# Patient Record
Sex: Female | Born: 1935
Health system: Southern US, Community
[De-identification: ages and names within clinical notes are randomized; demographics above are authoritative.]

## PROBLEM LIST (undated history)

## (undated) DIAGNOSIS — I10 Essential (primary) hypertension: Secondary | ICD-10-CM

## (undated) DIAGNOSIS — I509 Heart failure, unspecified: Secondary | ICD-10-CM

## (undated) DIAGNOSIS — E079 Disorder of thyroid, unspecified: Secondary | ICD-10-CM

## (undated) DIAGNOSIS — T7840XA Allergy, unspecified, initial encounter: Secondary | ICD-10-CM

## (undated) DIAGNOSIS — N289 Disorder of kidney and ureter, unspecified: Secondary | ICD-10-CM

## (undated) DIAGNOSIS — E785 Hyperlipidemia, unspecified: Secondary | ICD-10-CM

## (undated) DIAGNOSIS — L570 Actinic keratosis: Secondary | ICD-10-CM

## (undated) DIAGNOSIS — Z8589 Personal history of malignant neoplasm of other organs and systems: Secondary | ICD-10-CM

## (undated) DIAGNOSIS — I4891 Unspecified atrial fibrillation: Secondary | ICD-10-CM

## (undated) HISTORY — DX: Heart failure, unspecified: I50.9

## (undated) HISTORY — PX: CHOLECYSTECTOMY: SHX55

## (undated) HISTORY — PX: HIP SURGERY: SHX245

## (undated) HISTORY — DX: Essential (primary) hypertension: I10

## (undated) HISTORY — DX: Personal history of malignant neoplasm of other organs and systems: Z85.89

## (undated) HISTORY — DX: Hyperlipidemia, unspecified: E78.5

## (undated) HISTORY — DX: Allergy, unspecified, initial encounter: T78.40XA

## (undated) HISTORY — DX: Disorder of thyroid, unspecified: E07.9

---
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
## 2004-09-26 ENCOUNTER — Ambulatory Visit: Payer: Self-pay | Admitting: Internal Medicine

## 2005-04-17 ENCOUNTER — Ambulatory Visit: Payer: Self-pay | Admitting: Cardiology

## 2005-09-27 ENCOUNTER — Ambulatory Visit: Payer: Self-pay | Admitting: Internal Medicine

## 2005-11-06 ENCOUNTER — Ambulatory Visit: Payer: Self-pay | Admitting: Ophthalmology

## 2005-11-12 ENCOUNTER — Ambulatory Visit: Payer: Self-pay | Admitting: Ophthalmology

## 2006-03-25 ENCOUNTER — Ambulatory Visit: Payer: Self-pay | Admitting: Ophthalmology

## 2006-04-01 ENCOUNTER — Ambulatory Visit: Payer: Self-pay | Admitting: Ophthalmology

## 2006-10-01 ENCOUNTER — Ambulatory Visit: Payer: Self-pay | Admitting: Internal Medicine

## 2007-10-02 ENCOUNTER — Ambulatory Visit: Payer: Self-pay | Admitting: Internal Medicine

## 2008-10-04 ENCOUNTER — Ambulatory Visit: Payer: Self-pay | Admitting: Internal Medicine

## 2009-08-19 ENCOUNTER — Ambulatory Visit: Payer: Self-pay

## 2009-10-10 ENCOUNTER — Ambulatory Visit: Payer: Self-pay | Admitting: Internal Medicine

## 2010-10-12 ENCOUNTER — Ambulatory Visit: Payer: Self-pay | Admitting: Internal Medicine

## 2011-09-13 ENCOUNTER — Ambulatory Visit: Payer: Self-pay | Admitting: Unknown Physician Specialty

## 2011-09-13 LAB — CREATININE, SERUM
Creatinine: 1.13 mg/dL (ref 0.60–1.30)
EGFR (African American): 54 — ABNORMAL LOW

## 2011-10-15 ENCOUNTER — Ambulatory Visit: Payer: Self-pay | Admitting: Internal Medicine

## 2012-03-27 ENCOUNTER — Ambulatory Visit: Payer: Self-pay | Admitting: Internal Medicine

## 2012-10-15 ENCOUNTER — Ambulatory Visit: Payer: Self-pay | Admitting: Internal Medicine

## 2013-05-05 DIAGNOSIS — I4891 Unspecified atrial fibrillation: Secondary | ICD-10-CM | POA: Insufficient documentation

## 2013-08-26 DIAGNOSIS — Z9889 Other specified postprocedural states: Secondary | ICD-10-CM | POA: Insufficient documentation

## 2013-08-26 DIAGNOSIS — I493 Ventricular premature depolarization: Secondary | ICD-10-CM | POA: Insufficient documentation

## 2013-08-26 DIAGNOSIS — I071 Rheumatic tricuspid insufficiency: Secondary | ICD-10-CM | POA: Insufficient documentation

## 2013-08-26 DIAGNOSIS — I351 Nonrheumatic aortic (valve) insufficiency: Secondary | ICD-10-CM | POA: Insufficient documentation

## 2013-08-26 DIAGNOSIS — I340 Nonrheumatic mitral (valve) insufficiency: Secondary | ICD-10-CM | POA: Insufficient documentation

## 2013-10-20 DIAGNOSIS — N183 Chronic kidney disease, stage 3 unspecified: Secondary | ICD-10-CM | POA: Insufficient documentation

## 2013-10-26 ENCOUNTER — Ambulatory Visit: Payer: Self-pay | Admitting: Internal Medicine

## 2014-01-20 DIAGNOSIS — E034 Atrophy of thyroid (acquired): Secondary | ICD-10-CM | POA: Insufficient documentation

## 2014-04-26 DIAGNOSIS — D519 Vitamin B12 deficiency anemia, unspecified: Secondary | ICD-10-CM | POA: Insufficient documentation

## 2014-08-25 ENCOUNTER — Emergency Department: Payer: PPO

## 2014-08-25 ENCOUNTER — Encounter: Payer: Self-pay | Admitting: Emergency Medicine

## 2014-08-25 ENCOUNTER — Emergency Department
Admission: EM | Admit: 2014-08-25 | Discharge: 2014-08-25 | Disposition: A | Discharge status: Home / Self Care | Payer: PPO | Attending: Emergency Medicine | Admitting: Emergency Medicine

## 2014-08-25 ENCOUNTER — Other Ambulatory Visit: Payer: Self-pay

## 2014-08-25 DIAGNOSIS — S60221A Contusion of right hand, initial encounter: Secondary | ICD-10-CM | POA: Diagnosis not present

## 2014-08-25 DIAGNOSIS — Y9389 Activity, other specified: Secondary | ICD-10-CM | POA: Diagnosis not present

## 2014-08-25 DIAGNOSIS — Z880 Allergy status to penicillin: Secondary | ICD-10-CM | POA: Insufficient documentation

## 2014-08-25 DIAGNOSIS — I482 Chronic atrial fibrillation: Secondary | ICD-10-CM | POA: Diagnosis not present

## 2014-08-25 DIAGNOSIS — S20219A Contusion of unspecified front wall of thorax, initial encounter: Secondary | ICD-10-CM

## 2014-08-25 DIAGNOSIS — Y92410 Unspecified street and highway as the place of occurrence of the external cause: Secondary | ICD-10-CM | POA: Insufficient documentation

## 2014-08-25 DIAGNOSIS — S299XXA Unspecified injury of thorax, initial encounter: Secondary | ICD-10-CM | POA: Diagnosis present

## 2014-08-25 DIAGNOSIS — Y998 Other external cause status: Secondary | ICD-10-CM | POA: Insufficient documentation

## 2014-08-25 HISTORY — DX: Unspecified atrial fibrillation: I48.91

## 2014-08-25 LAB — CBC WITH DIFFERENTIAL/PLATELET
Basophils Absolute: 0 10*3/uL (ref 0–0.1)
Basophils Relative: 0 %
EOS ABS: 0 10*3/uL (ref 0–0.7)
Eosinophils Relative: 0 %
HCT: 35.9 % (ref 35.0–47.0)
Hemoglobin: 11.8 g/dL — ABNORMAL LOW (ref 12.0–16.0)
Lymphocytes Relative: 9 %
Lymphs Abs: 1.1 10*3/uL (ref 1.0–3.6)
MCH: 30.3 pg (ref 26.0–34.0)
MCHC: 32.9 g/dL (ref 32.0–36.0)
MCV: 92.1 fL (ref 80.0–100.0)
MONOS PCT: 8 %
Monocytes Absolute: 1 10*3/uL — ABNORMAL HIGH (ref 0.2–0.9)
NEUTROS PCT: 83 %
Neutro Abs: 9.9 10*3/uL — ABNORMAL HIGH (ref 1.4–6.5)
Platelets: 332 10*3/uL (ref 150–440)
RBC: 3.9 MIL/uL (ref 3.80–5.20)
RDW: 14 % (ref 11.5–14.5)
WBC: 12 10*3/uL — ABNORMAL HIGH (ref 3.6–11.0)

## 2014-08-25 LAB — BASIC METABOLIC PANEL
ANION GAP: 13 (ref 5–15)
BUN: 41 mg/dL — ABNORMAL HIGH (ref 6–20)
CALCIUM: 9.1 mg/dL (ref 8.9–10.3)
CO2: 24 mmol/L (ref 22–32)
Chloride: 95 mmol/L — ABNORMAL LOW (ref 101–111)
Creatinine, Ser: 1.5 mg/dL — ABNORMAL HIGH (ref 0.44–1.00)
GFR, EST AFRICAN AMERICAN: 36 mL/min — AB (ref >60)
GFR, EST NON AFRICAN AMERICAN: 31 mL/min — AB (ref >60)
Glucose, Bld: 113 mg/dL — ABNORMAL HIGH (ref 65–99)
POTASSIUM: 3.5 mmol/L (ref 3.5–5.1)
Sodium: 132 mmol/L — ABNORMAL LOW (ref 135–145)

## 2014-08-25 LAB — PROTIME-INR
INR: 1.17
PROTHROMBIN TIME: 15.1 s — AB (ref 11.4–15.0)

## 2014-08-25 LAB — TROPONIN I: Troponin I: 0.03 ng/mL (ref <0.031)
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
## 2014-08-25 NOTE — ED Notes (Signed)
Dr Reita Cliche at bedside, explaining lab results to pt and pt's husband.

## 2015-01-24 DIAGNOSIS — E034 Atrophy of thyroid (acquired): Secondary | ICD-10-CM | POA: Diagnosis not present

## 2015-01-24 DIAGNOSIS — E538 Deficiency of other specified B group vitamins: Secondary | ICD-10-CM | POA: Diagnosis not present

## 2015-01-31 ENCOUNTER — Other Ambulatory Visit: Payer: Self-pay | Admitting: Internal Medicine

## 2015-01-31 DIAGNOSIS — D519 Vitamin B12 deficiency anemia, unspecified: Secondary | ICD-10-CM | POA: Diagnosis not present

## 2015-01-31 DIAGNOSIS — Z1231 Encounter for screening mammogram for malignant neoplasm of breast: Secondary | ICD-10-CM

## 2015-01-31 DIAGNOSIS — E034 Atrophy of thyroid (acquired): Secondary | ICD-10-CM | POA: Diagnosis not present

## 2015-01-31 DIAGNOSIS — N183 Chronic kidney disease, stage 3 (moderate): Secondary | ICD-10-CM | POA: Diagnosis not present

## 2015-01-31 DIAGNOSIS — M810 Age-related osteoporosis without current pathological fracture: Secondary | ICD-10-CM | POA: Diagnosis not present

## 2015-01-31 DIAGNOSIS — I25110 Atherosclerotic heart disease of native coronary artery with unstable angina pectoris: Secondary | ICD-10-CM | POA: Diagnosis not present

## 2015-01-31 DIAGNOSIS — I482 Chronic atrial fibrillation: Secondary | ICD-10-CM | POA: Diagnosis not present

## 2015-01-31 DIAGNOSIS — E7800 Pure hypercholesterolemia, unspecified: Secondary | ICD-10-CM | POA: Diagnosis not present

## 2015-01-31 DIAGNOSIS — Z0001 Encounter for general adult medical examination with abnormal findings: Secondary | ICD-10-CM | POA: Diagnosis not present

## 2015-02-02 DIAGNOSIS — I482 Chronic atrial fibrillation: Secondary | ICD-10-CM | POA: Diagnosis not present

## 2015-02-07 DIAGNOSIS — M810 Age-related osteoporosis without current pathological fracture: Secondary | ICD-10-CM | POA: Diagnosis not present

## 2015-02-07 DIAGNOSIS — D519 Vitamin B12 deficiency anemia, unspecified: Secondary | ICD-10-CM | POA: Diagnosis not present

## 2015-02-09 ENCOUNTER — Other Ambulatory Visit: Payer: Self-pay | Admitting: Internal Medicine

## 2015-02-09 ENCOUNTER — Ambulatory Visit
Admission: RE | Admit: 2015-02-09 | Discharge: 2015-02-09 | Disposition: A | Discharge status: Home / Self Care | Payer: PPO | Source: Ambulatory Visit | Attending: Internal Medicine | Admitting: Internal Medicine

## 2015-02-09 DIAGNOSIS — Z1231 Encounter for screening mammogram for malignant neoplasm of breast: Secondary | ICD-10-CM

## 2015-02-14 DIAGNOSIS — Z9889 Other specified postprocedural states: Secondary | ICD-10-CM | POA: Diagnosis not present

## 2015-02-14 DIAGNOSIS — I341 Nonrheumatic mitral (valve) prolapse: Secondary | ICD-10-CM | POA: Diagnosis not present

## 2015-02-14 DIAGNOSIS — E7800 Pure hypercholesterolemia, unspecified: Secondary | ICD-10-CM | POA: Diagnosis not present

## 2015-02-14 DIAGNOSIS — I493 Ventricular premature depolarization: Secondary | ICD-10-CM | POA: Diagnosis not present

## 2015-02-14 DIAGNOSIS — N183 Chronic kidney disease, stage 3 (moderate): Secondary | ICD-10-CM | POA: Diagnosis not present

## 2015-02-14 DIAGNOSIS — I2510 Atherosclerotic heart disease of native coronary artery without angina pectoris: Secondary | ICD-10-CM | POA: Diagnosis not present

## 2015-02-14 DIAGNOSIS — I071 Rheumatic tricuspid insufficiency: Secondary | ICD-10-CM | POA: Diagnosis not present

## 2015-02-14 DIAGNOSIS — I340 Nonrheumatic mitral (valve) insufficiency: Secondary | ICD-10-CM | POA: Diagnosis not present

## 2015-02-14 DIAGNOSIS — I129 Hypertensive chronic kidney disease with stage 1 through stage 4 chronic kidney disease, or unspecified chronic kidney disease: Secondary | ICD-10-CM | POA: Diagnosis not present

## 2015-02-14 DIAGNOSIS — N2581 Secondary hyperparathyroidism of renal origin: Secondary | ICD-10-CM | POA: Diagnosis not present

## 2015-02-14 DIAGNOSIS — R0602 Shortness of breath: Secondary | ICD-10-CM | POA: Diagnosis not present

## 2015-02-14 DIAGNOSIS — R0681 Apnea, not elsewhere classified: Secondary | ICD-10-CM | POA: Insufficient documentation

## 2015-02-14 DIAGNOSIS — R609 Edema, unspecified: Secondary | ICD-10-CM | POA: Diagnosis not present

## 2015-02-14 DIAGNOSIS — I351 Nonrheumatic aortic (valve) insufficiency: Secondary | ICD-10-CM | POA: Diagnosis not present

## 2015-02-16 DIAGNOSIS — Z961 Presence of intraocular lens: Secondary | ICD-10-CM | POA: Diagnosis not present

## 2015-02-18 DIAGNOSIS — M461 Sacroiliitis, not elsewhere classified: Secondary | ICD-10-CM | POA: Diagnosis not present

## 2015-02-23 DIAGNOSIS — R0602 Shortness of breath: Secondary | ICD-10-CM | POA: Diagnosis not present

## 2015-02-23 DIAGNOSIS — I2510 Atherosclerotic heart disease of native coronary artery without angina pectoris: Secondary | ICD-10-CM | POA: Diagnosis not present

## 2015-03-02 DIAGNOSIS — I341 Nonrheumatic mitral (valve) prolapse: Secondary | ICD-10-CM | POA: Diagnosis not present

## 2015-03-02 DIAGNOSIS — I493 Ventricular premature depolarization: Secondary | ICD-10-CM | POA: Diagnosis not present

## 2015-03-02 DIAGNOSIS — I340 Nonrheumatic mitral (valve) insufficiency: Secondary | ICD-10-CM | POA: Diagnosis not present

## 2015-03-02 DIAGNOSIS — E7800 Pure hypercholesterolemia, unspecified: Secondary | ICD-10-CM | POA: Diagnosis not present

## 2015-03-02 DIAGNOSIS — I2510 Atherosclerotic heart disease of native coronary artery without angina pectoris: Secondary | ICD-10-CM | POA: Diagnosis not present

## 2015-03-02 DIAGNOSIS — I482 Chronic atrial fibrillation: Secondary | ICD-10-CM | POA: Diagnosis not present

## 2015-03-02 DIAGNOSIS — Z9889 Other specified postprocedural states: Secondary | ICD-10-CM | POA: Diagnosis not present

## 2015-03-02 DIAGNOSIS — I480 Paroxysmal atrial fibrillation: Secondary | ICD-10-CM | POA: Diagnosis not present

## 2015-03-02 DIAGNOSIS — R0602 Shortness of breath: Secondary | ICD-10-CM | POA: Diagnosis not present

## 2015-03-10 DIAGNOSIS — D519 Vitamin B12 deficiency anemia, unspecified: Secondary | ICD-10-CM | POA: Diagnosis not present

## 2015-03-18 DIAGNOSIS — M5134 Other intervertebral disc degeneration, thoracic region: Secondary | ICD-10-CM | POA: Diagnosis not present

## 2015-03-18 DIAGNOSIS — S22080D Wedge compression fracture of T11-T12 vertebra, subsequent encounter for fracture with routine healing: Secondary | ICD-10-CM | POA: Diagnosis not present

## 2015-03-18 DIAGNOSIS — M546 Pain in thoracic spine: Secondary | ICD-10-CM | POA: Diagnosis not present

## 2015-03-30 DIAGNOSIS — I482 Chronic atrial fibrillation: Secondary | ICD-10-CM | POA: Diagnosis not present

## 2015-04-04 DIAGNOSIS — Z85828 Personal history of other malignant neoplasm of skin: Secondary | ICD-10-CM | POA: Diagnosis not present

## 2015-04-04 DIAGNOSIS — I8393 Asymptomatic varicose veins of bilateral lower extremities: Secondary | ICD-10-CM | POA: Diagnosis not present

## 2015-04-04 DIAGNOSIS — L578 Other skin changes due to chronic exposure to nonionizing radiation: Secondary | ICD-10-CM | POA: Diagnosis not present

## 2015-04-04 DIAGNOSIS — L570 Actinic keratosis: Secondary | ICD-10-CM | POA: Diagnosis not present

## 2015-04-04 DIAGNOSIS — I781 Nevus, non-neoplastic: Secondary | ICD-10-CM | POA: Diagnosis not present

## 2015-04-04 DIAGNOSIS — D485 Neoplasm of uncertain behavior of skin: Secondary | ICD-10-CM | POA: Diagnosis not present

## 2015-04-04 DIAGNOSIS — D692 Other nonthrombocytopenic purpura: Secondary | ICD-10-CM | POA: Diagnosis not present

## 2015-04-04 DIAGNOSIS — L820 Inflamed seborrheic keratosis: Secondary | ICD-10-CM | POA: Diagnosis not present

## 2015-04-04 DIAGNOSIS — Z1283 Encounter for screening for malignant neoplasm of skin: Secondary | ICD-10-CM | POA: Diagnosis not present

## 2015-04-04 DIAGNOSIS — L821 Other seborrheic keratosis: Secondary | ICD-10-CM | POA: Diagnosis not present

## 2015-04-12 DIAGNOSIS — D519 Vitamin B12 deficiency anemia, unspecified: Secondary | ICD-10-CM | POA: Diagnosis not present

## 2015-04-27 DIAGNOSIS — I482 Chronic atrial fibrillation: Secondary | ICD-10-CM | POA: Diagnosis not present

## 2015-05-16 DIAGNOSIS — D519 Vitamin B12 deficiency anemia, unspecified: Secondary | ICD-10-CM | POA: Diagnosis not present

## 2015-05-25 DIAGNOSIS — I482 Chronic atrial fibrillation: Secondary | ICD-10-CM | POA: Diagnosis not present

## 2015-06-16 DIAGNOSIS — D519 Vitamin B12 deficiency anemia, unspecified: Secondary | ICD-10-CM | POA: Diagnosis not present

## 2015-06-27 DIAGNOSIS — I341 Nonrheumatic mitral (valve) prolapse: Secondary | ICD-10-CM | POA: Diagnosis not present

## 2015-06-27 DIAGNOSIS — R0602 Shortness of breath: Secondary | ICD-10-CM | POA: Diagnosis not present

## 2015-06-27 DIAGNOSIS — I2510 Atherosclerotic heart disease of native coronary artery without angina pectoris: Secondary | ICD-10-CM | POA: Diagnosis not present

## 2015-06-27 DIAGNOSIS — E7800 Pure hypercholesterolemia, unspecified: Secondary | ICD-10-CM | POA: Diagnosis not present

## 2015-06-27 DIAGNOSIS — I351 Nonrheumatic aortic (valve) insufficiency: Secondary | ICD-10-CM | POA: Diagnosis not present

## 2015-06-27 DIAGNOSIS — I340 Nonrheumatic mitral (valve) insufficiency: Secondary | ICD-10-CM | POA: Diagnosis not present

## 2015-06-27 DIAGNOSIS — I493 Ventricular premature depolarization: Secondary | ICD-10-CM | POA: Diagnosis not present

## 2015-06-27 DIAGNOSIS — I071 Rheumatic tricuspid insufficiency: Secondary | ICD-10-CM | POA: Diagnosis not present

## 2015-06-27 DIAGNOSIS — Z9889 Other specified postprocedural states: Secondary | ICD-10-CM | POA: Diagnosis not present

## 2015-06-27 DIAGNOSIS — I480 Paroxysmal atrial fibrillation: Secondary | ICD-10-CM | POA: Diagnosis not present

## 2015-07-18 DIAGNOSIS — D519 Vitamin B12 deficiency anemia, unspecified: Secondary | ICD-10-CM | POA: Diagnosis not present

## 2015-07-25 DIAGNOSIS — E034 Atrophy of thyroid (acquired): Secondary | ICD-10-CM | POA: Diagnosis not present

## 2015-07-25 DIAGNOSIS — I480 Paroxysmal atrial fibrillation: Secondary | ICD-10-CM | POA: Diagnosis not present

## 2015-07-28 ENCOUNTER — Ambulatory Visit
Admission: RE | Admit: 2015-07-28 | Discharge: 2015-07-28 | Disposition: A | Discharge status: Home / Self Care | Payer: PPO | Source: Ambulatory Visit | Attending: Pain Medicine | Admitting: Pain Medicine

## 2015-07-28 ENCOUNTER — Ambulatory Visit (HOSPITAL_BASED_OUTPATIENT_CLINIC_OR_DEPARTMENT_OTHER): Payer: PPO | Admitting: Pain Medicine

## 2015-07-28 ENCOUNTER — Encounter: Payer: Self-pay | Admitting: Pain Medicine

## 2015-07-28 VITALS — BP 136/60 | HR 72 | Temp 97.6°F | Resp 16 | Ht 60.0 in | Wt 157.5 lb

## 2015-07-28 DIAGNOSIS — I700 Atherosclerosis of aorta: Secondary | ICD-10-CM | POA: Diagnosis not present

## 2015-07-28 DIAGNOSIS — G8929 Other chronic pain: Secondary | ICD-10-CM | POA: Insufficient documentation

## 2015-07-28 DIAGNOSIS — S32010S Wedge compression fracture of first lumbar vertebra, sequela: Secondary | ICD-10-CM | POA: Diagnosis not present

## 2015-07-28 DIAGNOSIS — M533 Sacrococcygeal disorders, not elsewhere classified: Secondary | ICD-10-CM | POA: Insufficient documentation

## 2015-07-28 DIAGNOSIS — K449 Diaphragmatic hernia without obstruction or gangrene: Secondary | ICD-10-CM | POA: Insufficient documentation

## 2015-07-28 DIAGNOSIS — I341 Nonrheumatic mitral (valve) prolapse: Secondary | ICD-10-CM | POA: Insufficient documentation

## 2015-07-28 DIAGNOSIS — S32010A Wedge compression fracture of first lumbar vertebra, initial encounter for closed fracture: Secondary | ICD-10-CM | POA: Insufficient documentation

## 2015-07-28 DIAGNOSIS — M545 Low back pain, unspecified: Secondary | ICD-10-CM | POA: Insufficient documentation

## 2015-07-28 DIAGNOSIS — E785 Hyperlipidemia, unspecified: Secondary | ICD-10-CM | POA: Insufficient documentation

## 2015-07-28 DIAGNOSIS — I739 Peripheral vascular disease, unspecified: Secondary | ICD-10-CM | POA: Diagnosis not present

## 2015-07-28 DIAGNOSIS — M4854XS Collapsed vertebra, not elsewhere classified, thoracic region, sequela of fracture: Secondary | ICD-10-CM

## 2015-07-28 DIAGNOSIS — X58XXXA Exposure to other specified factors, initial encounter: Secondary | ICD-10-CM | POA: Insufficient documentation

## 2015-07-28 DIAGNOSIS — M5136 Other intervertebral disc degeneration, lumbar region: Secondary | ICD-10-CM | POA: Insufficient documentation

## 2015-07-28 DIAGNOSIS — M4856XA Collapsed vertebra, not elsewhere classified, lumbar region, initial encounter for fracture: Secondary | ICD-10-CM | POA: Diagnosis not present

## 2015-07-28 DIAGNOSIS — S22080S Wedge compression fracture of T11-T12 vertebra, sequela: Secondary | ICD-10-CM

## 2015-07-28 DIAGNOSIS — M47816 Spondylosis without myelopathy or radiculopathy, lumbar region: Secondary | ICD-10-CM | POA: Insufficient documentation

## 2015-07-28 DIAGNOSIS — I2510 Atherosclerotic heart disease of native coronary artery without angina pectoris: Secondary | ICD-10-CM | POA: Insufficient documentation

## 2015-07-28 DIAGNOSIS — S22080A Wedge compression fracture of T11-T12 vertebra, initial encounter for closed fracture: Secondary | ICD-10-CM | POA: Insufficient documentation

## 2015-07-28 DIAGNOSIS — M810 Age-related osteoporosis without current pathological fracture: Secondary | ICD-10-CM | POA: Insufficient documentation

## 2015-07-28 NOTE — Progress Notes (Signed)
Safety precautions to be maintained throughout the outpatient stay will include: orient to surroundings, keep bed in low position, maintain call bell within reach at all times, provide assistance with transfer out of bed and ambulation.  New Pt today

## 2015-07-28 NOTE — Progress Notes (Signed)
Patient's Name: Elizabeth Fields  Patient type: New patient  MRN: [id number]  Service setting: Ambulatory outpatient  DOB: May 02, 1931  Location: [facility]  DOS: 07/28/2015  Primary Care Physician: No primary care provider on file.  Note by: Philipp Callegari A. Dossie Arbour, M.D, DABA, DABAPM, DABPM, DABIPP, FIPP  Referring Physician: No ref. provider found  Specialty: Board-Certified Interventional Pain Management     Primary Reason(s) for Visit: Initial Patient Evaluation CC: Back Pain   HPI  Elizabeth Fields is a 82 y.o. year old, female patient, who comes today for an initial evaluation. She has Chronic sacroiliac joint pain (Left); Vitamin B12 deficiency anemia; AI (aortic incompetence); Atrial fibrillation ([place]); Arteriosclerosis of coronary artery; Chronic kidney disease (CKD), stage III (moderate); Beat, premature ventricular; History of cardiac catheterization; Bergmann's syndrome; HLD (hyperlipidemia); Acquired atrophy of thyroid; MI (mitral incompetence); Billowing mitral valve; Osteoporosis, post-menopausal; Breathlessness on exertion; TI (tricuspid incompetence); Lumbar facet syndrome (Bilateral); Chronic low back pain (Bilateral); Compression fracture of L1 lumbar vertebra (HCC); and Compression fracture of T12 vertebra (HCC) on her problem list.. Her primarily concern today is the Back Pain   Pain Assessment: Self-Reported Pain Score: 2              Reported level is compatible with observation       Pain Type: Chronic pain Pain Location: Back Pain Orientation: Mid, Lower Pain Descriptors / Indicators: Aching, Constant, Radiating (changes with activity) Pain Frequency: Constant  Onset and Duration: Gradual, Date of onset: About 1 year ago and Present longer than 3 months Cause of pain: Unknown Severity: No change since onset, NAS-11 at its worse: 8/10, NAS-11 at its best: 1/10, NAS-11 now: 5/10 and NAS-11 on the average: 4/10 Timing: During activity or exercise and After  activity or exercise Aggravating Factors: Climbing, Lifiting, Motion, Walking, Walking uphill and Working Alleviating Factors: Lying down Associated Problems: Night-time cramps and Fatigue Quality of Pain: Aching, Pressure-like, Sharp and Toothache-like Previous Examinations or Tests: X-rays and Orthoperdic evaluation Previous Treatments: Trigger point injections  The patient comes into the clinics today for the first time for a chronic pain management evaluation. The pain is described to be in the lower part of the back going across from one side to the other. The patient denies any significant referral of the pain to anywhere else. She also indicates that the pain is worse when she straightens up or when she stands for a prolonged period time, suggesting spinal stenosis. Since the patient is in her 80s, this is a likely scenario. In addition, the patient has a prior history of a T12 and an L1 compression fracture. Both of them seem to be old but the patient could've had some retropulsion into the canal and therefore we will be ordering a CT of that upper lumbar spine.   Historic Controlled Substance Pharmacotherapy Review  Currently Prescribed Analgesic: None MME/day: 0 mg/day Pharmacodynamics: N/A Historical Background Evaluation: N/A Risk Assessment: Opioid Risk Tool (ORT) Score: Total Score: 0 Low Risk for SUD (Score <3) Depression Scale Score: PHQ-2: PHQ-2 Total Score: 0 No depression (0) PHQ-9: PHQ-9 Total Score: 0 No depression (0-4)  Meds  The patient has a current medication list which includes the following prescription(s): aspirin, doxepin, esomeprazole, furosemide, levothyroxine, magnesium oxide, metoprolol succinate, simvastatin, spironolactone, and warfarin.  Imaging Review  Shoulder Imaging: Shoulder-R MR wo contrast:  Results for orders placed in visit on 08/19/09  MR Shoulder Right Wo Contrast   Narrative * PRIOR REPORT IMPORTED FROM AN EXTERNAL  SYSTEM *   PRIOR  REPORT IMPORTED FROM THE [facility] WORKFLOW SYSTEM   REASON FOR EXAM:    R SHOULDER PAIN  COMMENTS:   PROCEDURE:     MR  - MR SHOULDER RT  WO CONTRAST  - Aug 19 2009 12:20PM   RESULT:   TECHNIQUE: Multiplanar and multisequence imaging of the right shoulder was  obtained without the administration of gadolinium.   Evaluation of the osseous structures demonstrates mild subchondral cyst  formation along the humeral head. Degenerative changes are appreciated  involving the acromioclavicular joint. There is mild joint space  irregularity as well as mild osteophytosis. Evaluation of the  supraspinatus  tendon demonstrates an area of fluid signal that appears to rest  superficial  to the tendon. There is a mild amount of fluid signal along the  undersurface  of the tendon. The architecture of the tendon appears be intact. There is  no  evidence of muscle or tendon retraction. Fluid signal also projects within  the subacromial/subdeltoid bursal region.   Evaluation of marrow signal demonstrates a wavy area of signal void along  the inferior aspect of the glenoid. There is mild edema appreciated on the  proton-density imaging associated with this area as well as on the T2 fat  sat imaging. This may represent a Mach line though a nondisplaced fracture  cannot be excluded. The remaining osseous signal appears to be intact.   IMPRESSION:   1. Findings which likely represents bursitis involving the  subacromial/subdeltoid bursa. There does not appear to be evidence of  rotator cuff injury.  2. Findings which may represent an incomplete fracture involving the  inferior aspect of the glenoid versus a mach line.  3. Mild to moderate degenerative changes.   Thank you for the opportunity to contribute to the care of your patient.       Note: Imaging results reviewed.  ROS  Cardiovascular History: Heart trouble, Abnormal heart rhythm, Daily Aspirin intake, Heart valve problems, Heart  catheterization and Blood thinners:  Anticoagulant Pulmonary or Respiratory History: Negative for bronchial asthma, emphysema, chronic smoking, chronic bronchitis, sarcoidosis, tuberculosis or sleep apena Neurological History: Negative for epilepsy, stroke, urinary or fecal inontinence, spina bifida or tethered cord syndrome Review of Past Neurological Studies:  Results for orders placed or performed in visit on 09/13/11  MR Brain W Wo Contrast   Narrative   * PRIOR REPORT IMPORTED FROM AN EXTERNAL SYSTEM *   PRIOR REPORT IMPORTED FROM THE [facility] WORKFLOW SYSTEM   REASON FOR EXAM:    sudden rt hearing loss  COMMENTS:   PROCEDURE:     MR  - MR BRAIN W/WO INC IAC W/WO ROUTI  - Sep 13 2011  11:18AM   RESULT:     Technique: Multiplanar, multisequence MRI of the internal  auditory canals was obtained pre and post administration of 10 ml of  Magnevist IV contrast.   Comparison: No comparison .   Findings:   The cranial nerve VII and cranial nerve VIII bundles are normal in signal  without evidence of mass lesion or signal abnormality. There is no  abnormal  enhancement. There is no masses or signal abnormality in the  cerebellopontine angles.   There is no diffuse or focal parenchymal abnormality. There is no  diffusion  abnormality.  The ventricles are normal in size, shape, and position.  There  are no abnormal extra-axial fluid collections. There is generalized  cerebral  atrophy.   The included paranasal sinuses, mastoid  air cells and orbits are  unremarkable. The skull base and calvarium demonstrate normal signal. The  major intracranial flow voids, including dural venous sinuses appear  patent.   IMPRESSION:   Normal MRI of the internal auditory canals.   Dictation Site: 1       Psychological-Psychiatric History: Negative for anxiety, depression, schizophrenia, bipolar disorders or suicidal ideations or attempts Gastrointestinal History: Hiatal  hernia Genitourinary History: Renal failure Hematological History: Brusing easily and Positive for taking the following blood thinner: Coumadin Endocrine History: Hperthyroidism Rheumatologic History: Negative for lupus, osteoarthritis, rheumatoid arthritis, myositis, polymyositis or fibromyagia Musculoskeletal History: Negative for myasthenia gravis, muscular dystrophy, multiple sclerosis or malignant hyperthermia Work History: Retired  Allergies  Ms. Edmon is allergic to fosamax; penicillins; and tramadol.  Laboratory Chemistry  Inflammation Markers No results found for: ESRSEDRATE, CRP  Renal Function Lab Results  Component Value Date   BUN 41* 08/25/2014   CREATININE 1.50* 08/25/2014   GFRAA 36* 08/25/2014   GFRNONAA 31* 08/25/2014    Hepatic Function No results found for: AST, ALT, ALBUMIN  Electrolytes Lab Results  Component Value Date   NA 132* 08/25/2014   K 3.5 08/25/2014   CL 95* 08/25/2014   CALCIUM 9.1 08/25/2014    Pain Modulating Vitamins No results found for: Marveen Reeks, [id number], [id number], 25OHVITD1, 25OHVITD2, 25OHVITD3, VITAMINB12  Coagulation Parameters Lab Results  Component Value Date   INR 1.17 08/25/2014   LABPROT 15.1* 08/25/2014   PLT 332 08/25/2014    Cardiovascular Lab Results  Component Value Date   HGB 11.8* 08/25/2014   HCT 35.9 08/25/2014    Note: Lab results reviewed.  [place]  Medical:  Ms. Vidrio  has a past medical history of Atrial fibrillation ([place]); Allergy; Thyroid disease; Hyperlipidemia; Hypertension; and CHF (congestive heart failure) ([place]). Family: family history includes Breast cancer (age of onset: 39) in her mother; Heart disease in her father. Surgical:  has past surgical history that includes Cholecystectomy and Hip surgery. Tobacco:  reports that she has never smoked. She does not have any smokeless tobacco history on file. Alcohol:  reports that she does not drink alcohol. Drug:  reports that she does  not use illicit drugs. Active Ambulatory Problems    Diagnosis Date Noted  . Chronic sacroiliac joint pain (Left) 07/28/2015  . Vitamin B12 deficiency anemia 04/26/2014  . AI (aortic incompetence) 08/26/2013  . Atrial fibrillation ([place]) 05/05/2013  . Arteriosclerosis of coronary artery 07/28/2015  . Chronic kidney disease (CKD), stage III (moderate) 10/20/2013  . Beat, premature ventricular 08/26/2013  . History of cardiac catheterization 08/26/2013  . Bergmann's syndrome 07/28/2015  . HLD (hyperlipidemia) 07/28/2015  . Acquired atrophy of thyroid 01/20/2014  . MI (mitral incompetence) 08/26/2013  . Billowing mitral valve 07/28/2015  . Osteoporosis, post-menopausal 07/28/2015  . Breathlessness on exertion 02/14/2015  . TI (tricuspid incompetence) 08/26/2013  . Lumbar facet syndrome (Bilateral) 07/28/2015  . Chronic low back pain (Bilateral) 07/28/2015  . Compression fracture of L1 lumbar vertebra ([place]) 07/28/2015  . Compression fracture of T12 vertebra ([place]) 07/28/2015   Resolved Ambulatory Problems    Diagnosis Date Noted  . No Resolved Ambulatory Problems   Past Medical History  Diagnosis Date  . Allergy   . Thyroid disease   . Hyperlipidemia   . Hypertension   . CHF (congestive heart failure) (HCC)     Constitutional Exam  Vitals: Blood pressure 136/60, pulse 72, temperature 97.6 F (36.4 C), temperature source Oral, resp. rate 16, height 5' (1.524 m), weight  157 lb 8 oz (71.442 kg), SpO2 99 %. General appearance: Well nourished, well developed, and well hydrated. In no acute distress Calculated BMI/Body habitus: Body mass index is 30.76 kg/(m^2). (30-34.9 kg/m2) Obese (Class I) - 68% higher incidence of chronic pain Psych/Mental status: Alert and oriented x 3 (person, place, & time) Eyes: PERLA Respiratory: No evidence of acute respiratory distress  Cervical Spine Exam  Inspection: No masses, redness, or swelling Alignment: Symmetrical ROM: Functional: Adequate  ROM Active: Unrestricted ROM Stability: No instability detected Muscle strength & Tone: Functionally intact Sensory: Unimpaired Palpation: No complaints of tenderness  Upper Extremity (UE) Exam    Side: Right upper extremity  Side: Left upper extremity  Inspection: No masses, redness, swelling, or asymmetry  Inspection: No masses, redness, swelling, or asymmetry  ROM:  ROM:  Functional: Adequate ROM        Functional: Adequate ROM        Active: Unrestricted ROM  Active: Unrestricted ROM  Muscle strength & Tone: Functionally intact  Muscle strength & Tone: Functionally intact  Sensory: Unimpaired  Sensory: Unimpaired  Palpation: No complaints of tenderness  Palpation: No complaints of tenderness   Thoracic Spine Exam  Inspection: No masses, redness, or swelling Alignment: Mildly pronounced thoracic kyphosis. ROM: Functional: Adequate ROM Active: Unrestricted ROM Stability: No instability detected Sensory: Unimpaired Muscle strength & Tone: Functionally intact Palpation: No complaints of tenderness  Lumbar Spine Exam  Inspection: No masses, redness, or swelling Alignment: Symmetrical ROM: Functional: Limited ROM Active: Limited ROM Stability: No instability detected Muscle strength & Tone: Increased muscle tone and tension Sensory: Movement-associated pain Palpation: Area tender to palpation Provocative Tests: Lumbar Hyperextension and rotation test: Positive bilaterally for facet joint pain. Patrick's Maneuver: Positive for left-sided S-I joint pain           Gait & Posture Assessment  Ambulation: Unassisted Gait: Antalgic Posture: WNL   Lower Extremity Exam    Side: Right lower extremity  Side: Left lower extremity  Inspection: No masses, redness, swelling, or asymmetry ROM:  Inspection: No masses, redness, swelling, or asymmetry ROM:  Functional: Adequate ROM        Functional: Adequate ROM        Active: Unrestricted ROM  Active: Unrestricted ROM  Muscle  strength & Tone: Functionally intact  Muscle strength & Tone: Functionally intact  Sensory: Unimpaired  Sensory: Unimpaired  Palpation: No complaints of tenderness  Palpation: No complaints of tenderness   Assessment  Primary Diagnosis & Pertinent Problem List: The primary encounter diagnosis was Chronic left sacroiliac joint pain. Diagnoses of Facet syndrome, lumbar, Chronic low back pain, Compression fracture of L1 lumbar vertebra, sequela, and Compression fracture of T12 vertebra, sequela were also pertinent to this visit.  Visit Diagnosis: 1. Chronic left sacroiliac joint pain   2. Facet syndrome, lumbar   3. Chronic low back pain   4. Compression fracture of L1 lumbar vertebra, sequela   5. Compression fracture of T12 vertebra, sequela     Assessment: No problem-specific assessment & plan notes found for this encounter.   Plan of Care  Initial Treatment Plan:  Please be advised that as per protocol, today's visit has been an evaluation only. We have not taken over the patient's controlled substance management.  Problem List Items Addressed This Visit      High   Chronic low back pain (Bilateral) (Chronic)   Relevant Medications   aspirin 81 MG tablet   Other Relevant Orders   CT LUMBAR SPINE WO CONTRAST  DG Lumbar Spine Complete W/Bend   Chronic sacroiliac joint pain (Left) - Primary (Chronic)   Relevant Medications   aspirin 81 MG tablet   Other Relevant Orders   DG Si Joints   Compression fracture of L1 lumbar vertebra (HCC) (Chronic)   Relevant Orders   CT LUMBAR SPINE WO CONTRAST   Compression fracture of T12 vertebra (HCC) (Chronic)   Relevant Orders   CT LUMBAR SPINE WO CONTRAST   Lumbar facet syndrome (Bilateral) (Chronic)   Relevant Medications   aspirin 81 MG tablet   Other Relevant Orders   LUMBAR FACET(MEDIAL BRANCH NERVE BLOCK) MBNB      Pharmacotherapy (Medications Ordered): No orders of the defined types were placed in this encounter.     Lab-work & Procedure Ordered: Orders Placed This Encounter  Procedures  . LUMBAR FACET(MEDIAL BRANCH NERVE BLOCK) MBNB  . CT LUMBAR SPINE WO CONTRAST  . DG Lumbar Spine Complete W/Bend  . DG Si Joints    Imaging Ordered: CT LUMBAR SPINE WO CONTRAST  Interventional Therapies: Scheduled:  We will try to secure clearance from her cardiologist to stop her Coumadin for 5 days prior to a diagnostic bilateral lumbar facet block under fluoroscopic guidance and IV sedation.    Considering:   Possible bilateral lumbar facet radiofrequency ablation.  Possible lumbar epidural steroid injection under fluoroscopic guidance, no sedation.    PRN Procedures:  None at this time.    Referral(s) or Consult(s): Medical psychology consult for substance use disorder evaluation  Medications administered during this visit: Ms. Vint does not currently have medications on file.  Prescriptions ordered during this visit: New Prescriptions   No medications on file    Requested PM Follow-up: Return for Schedule Procedure.  No future appointments.   Primary Care Physician: No primary care provider on file. Location: [facility] Note by: Joleena Weisenburger A. Dossie Arbour, M.D, DABA, DABAPM, DABPM, DABIPP, FIPP  Pain Score Disclaimer: We use the NRS-11 scale. This is a self-reported, subjective measurement of pain severity with only modest accuracy. It is used primarily to identify changes within a particular patient. It must be understood that outpatient pain scales are significantly less accurate that those used for research, where they can be applied under ideal controlled circumstances with minimal exposure to variables. In reality, the score is likely to be a combination of pain intensity and pain affect, where pain affect describes the degree of emotional arousal or changes in action readiness caused by the sensory experience of pain. Factors such as social and work situation,  setting, emotional state, anxiety levels, expectation, and prior pain experience may influence pain perception and show large inter-individual differences that may also be affected by time variables.  Patient instructions provided during this appointment: Patient Instructions   Pain Management Discharge Instructions  General Discharge Instructions :  If you need to reach your doctor call: Monday-Friday 8:00 am - 4:00 pm at [phone] or toll free [phone].  After clinic hours [phone] to have operator reach doctor.  Bring all of your medication bottles to all your appointments in the pain clinic.  To cancel or reschedule your appointment with Pain Management please remember to call 24 hours in advance to avoid a fee.  Refer to the educational materials which you have been given on: General Risks, I had my Procedure. Discharge Instructions, Post Sedation.  Post Procedure Instructions:  The drugs you were given will stay in your system until tomorrow, so for the next 24 hours you should  not drive, make any legal decisions or drink any alcoholic beverages.  You may eat anything you prefer, but it is better to start with liquids then soups and crackers, and gradually work up to solid foods.  Please notify your doctor immediately if you have any unusual bleeding, trouble breathing or pain that is not related to your normal pain.  Depending on the type of procedure that was done, some parts of your body may feel week and/or numb.  This usually clears up by tonight or the next day.  Walk with the use of an assistive device or accompanied by an adult for the 24 hours.  You may use ice on the affected area for the first 24 hours.  Put ice in a Ziploc bag and cover with a towel and place against area 15 minutes on 15 minutes off.  You may switch to heat after 24 hours.Facet Joint Block The facet joints connect the bones of the spine (vertebrae). They make it possible for you to bend,  twist, and make other movements with your spine. They also prevent you from overbending, overtwisting, and making other excessive movements.  A facet joint block is a procedure where a numbing medicine (anesthetic) is injected into a facet joint. Often, a type of anti-inflammatory medicine called a steroid is also injected. A facet joint block may be done for two reasons:   Diagnosis. A facet joint block may be done as a test to see whether neck or back pain is caused by a worn-down or infected facet joint. If the pain gets better after a facet joint block, it means the pain is probably coming from the facet joint. If the pain does not get better, it means the pain is probably not coming from the facet joint.   Therapy. A facet joint block may be done to relieve neck or back pain caused by a facet joint. A facet joint block is only done as a therapy if the pain does not improve with medicine, exercise programs, physical therapy, and other forms of pain management. LET [facility] CARE PROVIDER KNOW ABOUT:   Any allergies you have.   All medicines you are taking, including vitamins, herbs, eyedrops, and over-the-counter medicines and creams.   Previous problems you or members of your family have had with the use of anesthetics.   Any blood disorders you have had.   Other health problems you have. RISKS AND COMPLICATIONS Generally, having a facet joint block is safe. However, as with any procedure, complications can occur. Possible complications associated with having a facet joint block include:   Bleeding.   Injury to a nerve near the injection site.   Pain at the injection site.   Weakness or numbness in areas controlled by nerves near the injection site.   Infection.   Temporary fluid retention.   Allergic reaction to anesthetics or medicines used during the procedure. BEFORE THE PROCEDURE   Follow your health care provider's instructions if you are taking dietary  supplements or medicines. You may need to stop taking them or reduce your dosage.   Do not take any new dietary supplements or medicines without asking your health care provider first.   Follow your health care provider's instructions about eating and drinking before the procedure. You may need to stop eating and drinking several hours before the procedure.   Arrange to have an adult drive you home after the procedure. PROCEDURE  You may need to remove your clothing and dress  in an open-back gown so that your health care provider can access your spine.   The procedure will be done while you are lying on an X-ray table. Most of the time you will be asked to lie on your stomach, but you may be asked to lie in a different position if an injection will be made in your neck.   Special machines will be used to monitor your oxygen levels, heart rate, and blood pressure.   If an injection will be made in your neck, an intravenous (IV) tube will be inserted into one of your veins. Fluids and medicine will flow directly into your body through the IV tube.   The area over the facet joint where the injection will be made will be cleaned with an antiseptic soap. The surrounding skin will be covered with sterile drapes.   An anesthetic will be applied to your skin to make the injection area numb. You may feel a temporary stinging or burning sensation.   A video X-ray machine will be used to locate the joint. A contrast dye may be injected into the facet joint area to help with locating the joint.   When the joint is located, an anesthetic medicine will be injected into the joint through the needle.   Your health care provider will ask you whether you feel pain relief. If you do feel relief, a steroid may be injected to provide pain relief for a longer period of time. If you do not feel relief or feel only partial relief, additional injections of an anesthetic may be made in other facet joints.    The needle will be removed, the skin will be cleansed, and bandages will be applied.  AFTER THE PROCEDURE   You will be observed for 15-30 minutes before being allowed to go home. Do not drive. Have an adult drive you or take a taxi or public transportation instead.   If you feel pain relief, the pain will return in several hours or days when the anesthetic wears off.   You may feel pain relief 2-14 days after the procedure. The amount of time this relief lasts varies from person to person.   It is normal to feel some tenderness over the injected area(s) for 2 days following the procedure.   If you have diabetes, you may have a temporary increase in blood sugar.   This information is not intended to replace advice given to you by your health care provider. Make sure you discuss any questions you have with your health care provider.   Document Released: 05/16/2006 Document Revised: 01/15/2014 Document Reviewed: 10/15/2011 Elsevier Interactive Patient Education 2016 [place]  What are the risk, side effects and possible complications? Generally speaking, most procedures are safe.  However, with any procedure there are risks, side effects, and the possibility of complications.  The risks and complications are dependent upon the sites that are lesioned, or the type of nerve block to be performed.  The closer the procedure is to the spine, the more serious the risks are.  Great care is taken when placing the radio frequency needles, block needles or lesioning probes, but sometimes complications can occur.  Infection: Any time there is an injection through the skin, there is a risk of infection.  This is why sterile conditions are used for these blocks.  There are four possible types of infection.  Localized skin infection.  Central Nervous System Infection-This can be in the form  of Meningitis, which can be deadly.  Epidural Infections-This can  be in the form of an epidural abscess, which can cause pressure inside of the spine, causing compression of the spinal cord with subsequent paralysis. This would require an emergency surgery to decompress, and there are no guarantees that the patient would recover from the paralysis.  Discitis-This is an infection of the intervertebral discs.  It occurs in about 1% of discography procedures.  It is difficult to treat and it may lead to surgery.        2. Pain: the needles have to go through skin and soft tissues, will cause soreness.       3. Damage to internal structures:  The nerves to be lesioned may be near blood vessels or    other nerves which can be potentially damaged.       4. Bleeding: Bleeding is more common if the patient is taking blood thinners such as  aspirin, Coumadin, Ticiid, Plavix, etc., or if he/she have some genetic predisposition  such as hemophilia. Bleeding into the spinal canal can cause compression of the spinal  cord with subsequent paralysis.  This would require an emergency surgery to  decompress and there are no guarantees that the patient would recover from the  paralysis.       5. Pneumothorax:  Puncturing of a lung is a possibility, every time a needle is introduced in  the area of the chest or upper back.  Pneumothorax refers to free air around the  collapsed lung(s), inside of the thoracic cavity (chest cavity).  Another two possible  complications related to a similar event would include: Hemothorax and Chylothorax.   These are variations of the Pneumothorax, where instead of air around the collapsed  lung(s), you may have blood or chyle, respectively.       6. Spinal headaches: They may occur with any procedures in the area of the spine.       7. Persistent CSF (Cerebro-Spinal Fluid) leakage: This is a rare problem, but may occur  with prolonged intrathecal or epidural catheters either due to the formation of a fistulous  track or a dural tear.       8. Nerve damage:  By working so close to the spinal cord, there is always a possibility of  nerve damage, which could be as serious as a permanent spinal cord injury with  paralysis.       9. Death:  Although rare, severe deadly allergic reactions known as "Anaphylactic  reaction" can occur to any of the medications used.      10. Worsening of the symptoms:  We can always make thing worse.  What are the chances of something like this happening? Chances of any of this occuring are extremely low.  By statistics, you have more of a chance of getting killed in a motor vehicle accident: while driving to the hospital than any of the above occurring .  Nevertheless, you should be aware that they are possibilities.  In general, it is similar to taking a shower.  Everybody knows that you can slip, hit your head and get killed.  Does that mean that you should not shower again?  Nevertheless always keep in mind that statistics do not mean anything if you happen to be on the wrong side of them.  Even if a procedure has a 1 (one) in a 1,000,000 (million) chance of going wrong, it you happen to be that one..Also, keep in mind that  by statistics, you have more of a chance of having something go wrong when taking medications.  Who should not have this procedure? If you are on a blood thinning medication (e.g. Coumadin, Plavix, see list of "Blood Thinners"), or if you have an active infection going on, you should not have the procedure.  If you are taking any blood thinners, please inform your physician.  How should I prepare for this procedure?  Do not eat or drink anything at least six hours prior to the procedure.  Bring a driver with you .  It cannot be a taxi.  Come accompanied by an adult that can drive you back, and that is strong enough to help you if your legs get weak or numb from the local anesthetic.  Take all of your medicines the morning of the procedure with just enough water to swallow them.  If you have diabetes,  make sure that you are scheduled to have your procedure done first thing in the morning, whenever possible.  If you have diabetes, take only half of your insulin dose and notify our nurse that you have done so as soon as you arrive at the clinic.  If you are diabetic, but only take blood sugar pills (oral hypoglycemic), then do not take them on the morning of your procedure.  You may take them after you have had the procedure.  Do not take aspirin or any aspirin-containing medications, at least eleven (11) days prior to the procedure.  They may prolong bleeding.  Wear loose fitting clothing that may be easy to take off and that you would not mind if it got stained with Betadine or blood.  Do not wear any jewelry or perfume  Remove any nail coloring.  It will interfere with some of our monitoring equipment.  NOTE: Remember that this is not meant to be interpreted as a complete list of all possible complications.  Unforeseen problems may occur.  BLOOD THINNERS The following drugs contain aspirin or other products, which can cause increased bleeding during surgery and should not be taken for 2 weeks prior to and 1 week after surgery.  If you should need take something for relief of minor pain, you may take acetaminophen which is found in Tylenol,m Datril, Anacin-3 and Panadol. It is not blood thinner. The products listed below are.  Do not take any of the products listed below in addition to any listed on your instruction sheet.  A.P.C or A.P.C with Codeine Codeine Phosphate Capsules #3 Ibuprofen Ridaura  ABC compound Congesprin Imuran rimadil  Advil Cope Indocin Robaxisal  Alka-Seltzer Effervescent Pain Reliever and Antacid Coricidin or Coricidin-D  Indomethacin Rufen  Alka-Seltzer plus Cold Medicine Cosprin Ketoprofen S-A-C Tablets  Anacin Analgesic Tablets or Capsules Coumadin Korlgesic Salflex  Anacin Extra Strength Analgesic tablets or capsules CP-2 Tablets Lanoril Salicylate  Anaprox  Cuprimine Capsules Levenox Salocol  Anexsia-D Dalteparin Magan Salsalate  Anodynos Darvon compound Magnesium Salicylate Sine-off  Ansaid Dasin Capsules Magsal Sodium Salicylate  Anturane Depen Capsules Marnal Soma  APF Arthritis pain formula Dewitt's Pills Measurin Stanback  Argesic Dia-Gesic Meclofenamic Sulfinpyrazone  Arthritis Bayer Timed Release Aspirin Diclofenac Meclomen Sulindac  Arthritis pain formula Anacin Dicumarol Medipren Supac  Analgesic (Safety coated) Arthralgen Diffunasal Mefanamic Suprofen  Arthritis Strength Bufferin Dihydrocodeine Mepro Compound Suprol  Arthropan liquid Dopirydamole Methcarbomol with Aspirin Synalgos  ASA tablets/Enseals Disalcid Micrainin Tagament  Ascriptin Doan's Midol Talwin  Ascriptin A/D Dolene Mobidin Tanderil  Ascriptin Extra Strength Dolobid Moblgesic Ticlid  Ascriptin  with Codeine Doloprin or Doloprin with Codeine Momentum Tolectin  Asperbuf Duoprin Mono-gesic Trendar  Aspergum Duradyne Motrin or Motrin IB Triminicin  Aspirin plain, buffered or enteric coated Durasal Myochrisine Trigesic  Aspirin Suppositories Easprin Nalfon Trillsate  Aspirin with Codeine Ecotrin Regular or Extra Strength Naprosyn Uracel  Atromid-S Efficin Naproxen Ursinus  Auranofin Capsules Elmiron Neocylate Vanquish  Axotal Emagrin Norgesic Verin  Azathioprine Empirin or Empirin with Codeine Normiflo Vitamin E  Azolid Emprazil Nuprin Voltaren  Bayer Aspirin plain, buffered or children's or timed BC Tablets or powders Encaprin Orgaran Warfarin Sodium  Buff-a-Comp Enoxaparin Orudis Zorpin  Buff-a-Comp with Codeine Equegesic Os-Cal-Gesic   Buffaprin Excedrin plain, buffered or Extra Strength Oxalid   Bufferin Arthritis Strength Feldene Oxphenbutazone   Bufferin plain or Extra Strength Feldene Capsules Oxycodone with Aspirin   Bufferin with Codeine Fenoprofen Fenoprofen Pabalate or Pabalate-SF   Buffets II Flogesic Panagesic   Buffinol plain or Extra Strength Florinal  or Florinal with Codeine Panwarfarin   Buf-Tabs Flurbiprofen Penicillamine   Butalbital Compound Four-way cold tablets Penicillin   Butazolidin Fragmin Pepto-Bismol   Carbenicillin Geminisyn Percodan   Carna Arthritis Reliever Geopen Persantine   Carprofen Gold's salt Persistin   Chloramphenicol Goody's Phenylbutazone   Chloromycetin Haltrain Piroxlcam   Clmetidine heparin Plaquenil   Cllnoril Hyco-pap Ponstel   Clofibrate Hydroxy chloroquine Propoxyphen         Before stopping any of these medications, be sure to consult the physician who ordered them.  Some, such as Coumadin (Warfarin) are ordered to prevent or treat serious conditions such as "deep thrombosis", "pumonary embolisms", and other heart problems.  The amount of time that you may need off of the medication may also vary with the medication and the reason for which you were taking it.  If you are taking any of these medications, please make sure you notify your pain physician before you undergo any procedures.

## 2015-07-28 NOTE — Patient Instructions (Addendum)
Pain Management Discharge Instructions  General Discharge Instructions :  If you need to reach your doctor call: Monday-Friday 8:00 am - 4:00 pm at [phone] or toll free [phone].  After clinic hours [phone] to have operator reach doctor.  Bring all of your medication bottles to all your appointments in the pain clinic.  To cancel or reschedule your appointment with Pain Management please remember to call 24 hours in advance to avoid a fee.  Refer to the educational materials which you have been given on: General Risks, I had my Procedure. Discharge Instructions, Post Sedation.  Post Procedure Instructions:  The drugs you were given will stay in your system until tomorrow, so for the next 24 hours you should not drive, make any legal decisions or drink any alcoholic beverages.  You may eat anything you prefer, but it is better to start with liquids then soups and crackers, and gradually work up to solid foods.  Please notify your doctor immediately if you have any unusual bleeding, trouble breathing or pain that is not related to your normal pain.  Depending on the type of procedure that was done, some parts of your body may feel week and/or numb.  This usually clears up by tonight or the next day.  Walk with the use of an assistive device or accompanied by an adult for the 24 hours.  You may use ice on the affected area for the first 24 hours.  Put ice in a Ziploc bag and cover with a towel and place against area 15 minutes on 15 minutes off.  You may switch to heat after 24 hours.Facet Joint Block The facet joints connect the bones of the spine (vertebrae). They make it possible for you to bend, twist, and make other movements with your spine. They also prevent you from overbending, overtwisting, and making other excessive movements.  A facet joint block is a procedure where a numbing medicine (anesthetic) is injected into a facet joint. Often, a type of anti-inflammatory  medicine called a steroid is also injected. A facet joint block may be done for two reasons:   Diagnosis. A facet joint block may be done as a test to see whether neck or back pain is caused by a worn-down or infected facet joint. If the pain gets better after a facet joint block, it means the pain is probably coming from the facet joint. If the pain does not get better, it means the pain is probably not coming from the facet joint.   Therapy. A facet joint block may be done to relieve neck or back pain caused by a facet joint. A facet joint block is only done as a therapy if the pain does not improve with medicine, exercise programs, physical therapy, and other forms of pain management. LET [facility] CARE PROVIDER KNOW ABOUT:   Any allergies you have.   All medicines you are taking, including vitamins, herbs, eyedrops, and over-the-counter medicines and creams.   Previous problems you or members of your family have had with the use of anesthetics.   Any blood disorders you have had.   Other health problems you have. RISKS AND COMPLICATIONS Generally, having a facet joint block is safe. However, as with any procedure, complications can occur. Possible complications associated with having a facet joint block include:   Bleeding.   Injury to a nerve near the injection site.   Pain at the injection site.   Weakness or numbness in areas controlled by nerves near  the injection site.   Infection.   Temporary fluid retention.   Allergic reaction to anesthetics or medicines used during the procedure. BEFORE THE PROCEDURE   Follow your health care provider's instructions if you are taking dietary supplements or medicines. You may need to stop taking them or reduce your dosage.   Do not take any new dietary supplements or medicines without asking your health care provider first.   Follow your health care provider's instructions about eating and drinking before the  procedure. You may need to stop eating and drinking several hours before the procedure.   Arrange to have an adult drive you home after the procedure. PROCEDURE  You may need to remove your clothing and dress in an open-back gown so that your health care provider can access your spine.   The procedure will be done while you are lying on an X-ray table. Most of the time you will be asked to lie on your stomach, but you may be asked to lie in a different position if an injection will be made in your neck.   Special machines will be used to monitor your oxygen levels, heart rate, and blood pressure.   If an injection will be made in your neck, an intravenous (IV) tube will be inserted into one of your veins. Fluids and medicine will flow directly into your body through the IV tube.   The area over the facet joint where the injection will be made will be cleaned with an antiseptic soap. The surrounding skin will be covered with sterile drapes.   An anesthetic will be applied to your skin to make the injection area numb. You may feel a temporary stinging or burning sensation.   A video X-ray machine will be used to locate the joint. A contrast dye may be injected into the facet joint area to help with locating the joint.   When the joint is located, an anesthetic medicine will be injected into the joint through the needle.   Your health care provider will ask you whether you feel pain relief. If you do feel relief, a steroid may be injected to provide pain relief for a longer period of time. If you do not feel relief or feel only partial relief, additional injections of an anesthetic may be made in other facet joints.   The needle will be removed, the skin will be cleansed, and bandages will be applied.  AFTER THE PROCEDURE   You will be observed for 15-30 minutes before being allowed to go home. Do not drive. Have an adult drive you or take a taxi or public transportation instead.    If you feel pain relief, the pain will return in several hours or days when the anesthetic wears off.   You may feel pain relief 2-14 days after the procedure. The amount of time this relief lasts varies from person to person.   It is normal to feel some tenderness over the injected area(s) for 2 days following the procedure.   If you have diabetes, you may have a temporary increase in blood sugar.   This information is not intended to replace advice given to you by your health care provider. Make sure you discuss any questions you have with your health care provider.   Document Released: 05/16/2006 Document Revised: 01/15/2014 Document Reviewed: 10/15/2011 Elsevier Interactive Patient Education 2016 [place]  What are the risk, side effects and possible complications? Generally speaking, most procedures are  safe.  However, with any procedure there are risks, side effects, and the possibility of complications.  The risks and complications are dependent upon the sites that are lesioned, or the type of nerve block to be performed.  The closer the procedure is to the spine, the more serious the risks are.  Great care is taken when placing the radio frequency needles, block needles or lesioning probes, but sometimes complications can occur.  Infection: Any time there is an injection through the skin, there is a risk of infection.  This is why sterile conditions are used for these blocks.  There are four possible types of infection.  Localized skin infection.  Central Nervous System Infection-This can be in the form of Meningitis, which can be deadly.  Epidural Infections-This can be in the form of an epidural abscess, which can cause pressure inside of the spine, causing compression of the spinal cord with subsequent paralysis. This would require an emergency surgery to decompress, and there are no guarantees that the patient would recover from the  paralysis.  Discitis-This is an infection of the intervertebral discs.  It occurs in about 1% of discography procedures.  It is difficult to treat and it may lead to surgery.        2. Pain: the needles have to go through skin and soft tissues, will cause soreness.       3. Damage to internal structures:  The nerves to be lesioned may be near blood vessels or    other nerves which can be potentially damaged.       4. Bleeding: Bleeding is more common if the patient is taking blood thinners such as  aspirin, Coumadin, Ticiid, Plavix, etc., or if he/she have some genetic predisposition  such as hemophilia. Bleeding into the spinal canal can cause compression of the spinal  cord with subsequent paralysis.  This would require an emergency surgery to  decompress and there are no guarantees that the patient would recover from the  paralysis.       5. Pneumothorax:  Puncturing of a lung is a possibility, every time a needle is introduced in  the area of the chest or upper back.  Pneumothorax refers to free air around the  collapsed lung(s), inside of the thoracic cavity (chest cavity).  Another two possible  complications related to a similar event would include: Hemothorax and Chylothorax.   These are variations of the Pneumothorax, where instead of air around the collapsed  lung(s), you may have blood or chyle, respectively.       6. Spinal headaches: They may occur with any procedures in the area of the spine.       7. Persistent CSF (Cerebro-Spinal Fluid) leakage: This is a rare problem, but may occur  with prolonged intrathecal or epidural catheters either due to the formation of a fistulous  track or a dural tear.       8. Nerve damage: By working so close to the spinal cord, there is always a possibility of  nerve damage, which could be as serious as a permanent spinal cord injury with  paralysis.       9. Death:  Although rare, severe deadly allergic reactions known as "Anaphylactic  reaction" can occur  to any of the medications used.      10. Worsening of the symptoms:  We can always make thing worse.  What are the chances of something like this happening? Chances of any of this occuring are extremely  low.  By statistics, you have more of a chance of getting killed in a motor vehicle accident: while driving to the hospital than any of the above occurring .  Nevertheless, you should be aware that they are possibilities.  In general, it is similar to taking a shower.  Everybody knows that you can slip, hit your head and get killed.  Does that mean that you should not shower again?  Nevertheless always keep in mind that statistics do not mean anything if you happen to be on the wrong side of them.  Even if a procedure has a 1 (one) in a 1,000,000 (million) chance of going wrong, it you happen to be that one..Also, keep in mind that by statistics, you have more of a chance of having something go wrong when taking medications.  Who should not have this procedure? If you are on a blood thinning medication (e.g. Coumadin, Plavix, see list of "Blood Thinners"), or if you have an active infection going on, you should not have the procedure.  If you are taking any blood thinners, please inform your physician.  How should I prepare for this procedure?  Do not eat or drink anything at least six hours prior to the procedure.  Bring a driver with you .  It cannot be a taxi.  Come accompanied by an adult that can drive you back, and that is strong enough to help you if your legs get weak or numb from the local anesthetic.  Take all of your medicines the morning of the procedure with just enough water to swallow them.  If you have diabetes, make sure that you are scheduled to have your procedure done first thing in the morning, whenever possible.  If you have diabetes, take only half of your insulin dose and notify our nurse that you have done so as soon as you arrive at the clinic.  If you are diabetic,  but only take blood sugar pills (oral hypoglycemic), then do not take them on the morning of your procedure.  You may take them after you have had the procedure.  Do not take aspirin or any aspirin-containing medications, at least eleven (11) days prior to the procedure.  They may prolong bleeding.  Wear loose fitting clothing that may be easy to take off and that you would not mind if it got stained with Betadine or blood.  Do not wear any jewelry or perfume  Remove any nail coloring.  It will interfere with some of our monitoring equipment.  NOTE: Remember that this is not meant to be interpreted as a complete list of all possible complications.  Unforeseen problems may occur.  BLOOD THINNERS The following drugs contain aspirin or other products, which can cause increased bleeding during surgery and should not be taken for 2 weeks prior to and 1 week after surgery.  If you should need take something for relief of minor pain, you may take acetaminophen which is found in Tylenol,m Datril, Anacin-3 and Panadol. It is not blood thinner. The products listed below are.  Do not take any of the products listed below in addition to any listed on your instruction sheet.  A.P.C or A.P.C with Codeine Codeine Phosphate Capsules #3 Ibuprofen Ridaura  ABC compound Congesprin Imuran rimadil  Advil Cope Indocin Robaxisal  Alka-Seltzer Effervescent Pain Reliever and Antacid Coricidin or Coricidin-D  Indomethacin Rufen  Alka-Seltzer plus Cold Medicine Cosprin Ketoprofen S-A-C Tablets  Anacin Analgesic Tablets or Capsules Coumadin Korlgesic Salflex  Anacin Extra Strength Analgesic tablets or capsules CP-2 Tablets Lanoril Salicylate  Anaprox Cuprimine Capsules Levenox Salocol  Anexsia-D Dalteparin Magan Salsalate  Anodynos Darvon compound Magnesium Salicylate Sine-off  Ansaid Dasin Capsules Magsal Sodium Salicylate  Anturane Depen Capsules Marnal Soma  APF Arthritis pain formula Dewitt's Pills Measurin  Stanback  Argesic Dia-Gesic Meclofenamic Sulfinpyrazone  Arthritis Bayer Timed Release Aspirin Diclofenac Meclomen Sulindac  Arthritis pain formula Anacin Dicumarol Medipren Supac  Analgesic (Safety coated) Arthralgen Diffunasal Mefanamic Suprofen  Arthritis Strength Bufferin Dihydrocodeine Mepro Compound Suprol  Arthropan liquid Dopirydamole Methcarbomol with Aspirin Synalgos  ASA tablets/Enseals Disalcid Micrainin Tagament  Ascriptin Doan's Midol Talwin  Ascriptin A/D Dolene Mobidin Tanderil  Ascriptin Extra Strength Dolobid Moblgesic Ticlid  Ascriptin with Codeine Doloprin or Doloprin with Codeine Momentum Tolectin  Asperbuf Duoprin Mono-gesic Trendar  Aspergum Duradyne Motrin or Motrin IB Triminicin  Aspirin plain, buffered or enteric coated Durasal Myochrisine Trigesic  Aspirin Suppositories Easprin Nalfon Trillsate  Aspirin with Codeine Ecotrin Regular or Extra Strength Naprosyn Uracel  Atromid-S Efficin Naproxen Ursinus  Auranofin Capsules Elmiron Neocylate Vanquish  Axotal Emagrin Norgesic Verin  Azathioprine Empirin or Empirin with Codeine Normiflo Vitamin E  Azolid Emprazil Nuprin Voltaren  Bayer Aspirin plain, buffered or children's or timed BC Tablets or powders Encaprin Orgaran Warfarin Sodium  Buff-a-Comp Enoxaparin Orudis Zorpin  Buff-a-Comp with Codeine Equegesic Os-Cal-Gesic   Buffaprin Excedrin plain, buffered or Extra Strength Oxalid   Bufferin Arthritis Strength Feldene Oxphenbutazone   Bufferin plain or Extra Strength Feldene Capsules Oxycodone with Aspirin   Bufferin with Codeine Fenoprofen Fenoprofen Pabalate or Pabalate-SF   Buffets II Flogesic Panagesic   Buffinol plain or Extra Strength Florinal or Florinal with Codeine Panwarfarin   Buf-Tabs Flurbiprofen Penicillamine   Butalbital Compound Four-way cold tablets Penicillin   Butazolidin Fragmin Pepto-Bismol   Carbenicillin Geminisyn Percodan   Carna Arthritis Reliever Geopen Persantine   Carprofen Gold's  salt Persistin   Chloramphenicol Goody's Phenylbutazone   Chloromycetin Haltrain Piroxlcam   Clmetidine heparin Plaquenil   Cllnoril Hyco-pap Ponstel   Clofibrate Hydroxy chloroquine Propoxyphen         Before stopping any of these medications, be sure to consult the physician who ordered them.  Some, such as Coumadin (Warfarin) are ordered to prevent or treat serious conditions such as "deep thrombosis", "pumonary embolisms", and other heart problems.  The amount of time that you may need off of the medication may also vary with the medication and the reason for which you were taking it.  If you are taking any of these medications, please make sure you notify your pain physician before you undergo any procedures.

## 2015-08-01 DIAGNOSIS — E034 Atrophy of thyroid (acquired): Secondary | ICD-10-CM | POA: Diagnosis not present

## 2015-08-01 DIAGNOSIS — I2510 Atherosclerotic heart disease of native coronary artery without angina pectoris: Secondary | ICD-10-CM | POA: Diagnosis not present

## 2015-08-01 DIAGNOSIS — I482 Chronic atrial fibrillation: Secondary | ICD-10-CM | POA: Diagnosis not present

## 2015-08-02 NOTE — Progress Notes (Signed)
Results were reviewed and found to be: abnormal, but not significant    Review would suggest interventional pain management techniques may be of benefit  Further testing may be useful

## 2015-08-15 DIAGNOSIS — N2581 Secondary hyperparathyroidism of renal origin: Secondary | ICD-10-CM | POA: Diagnosis not present

## 2015-08-15 DIAGNOSIS — R601 Generalized edema: Secondary | ICD-10-CM | POA: Diagnosis not present

## 2015-08-15 DIAGNOSIS — I129 Hypertensive chronic kidney disease with stage 1 through stage 4 chronic kidney disease, or unspecified chronic kidney disease: Secondary | ICD-10-CM | POA: Diagnosis not present

## 2015-08-15 DIAGNOSIS — N183 Chronic kidney disease, stage 3 (moderate): Secondary | ICD-10-CM | POA: Diagnosis not present

## 2015-08-18 DIAGNOSIS — D519 Vitamin B12 deficiency anemia, unspecified: Secondary | ICD-10-CM | POA: Diagnosis not present

## 2015-08-22 DIAGNOSIS — I482 Chronic atrial fibrillation: Secondary | ICD-10-CM | POA: Diagnosis not present

## 2015-09-06 ENCOUNTER — Ambulatory Visit: Payer: PPO | Attending: Pain Medicine | Admitting: Pain Medicine

## 2015-09-06 ENCOUNTER — Encounter: Payer: Self-pay | Admitting: Pain Medicine

## 2015-09-06 VITALS — BP 144/77 | HR 72 | Temp 98.0°F | Resp 15 | Ht 61.0 in | Wt 104.0 lb

## 2015-09-06 DIAGNOSIS — I4891 Unspecified atrial fibrillation: Secondary | ICD-10-CM | POA: Insufficient documentation

## 2015-09-06 DIAGNOSIS — E785 Hyperlipidemia, unspecified: Secondary | ICD-10-CM | POA: Diagnosis not present

## 2015-09-06 DIAGNOSIS — I2510 Atherosclerotic heart disease of native coronary artery without angina pectoris: Secondary | ICD-10-CM | POA: Diagnosis not present

## 2015-09-06 DIAGNOSIS — M545 Low back pain, unspecified: Secondary | ICD-10-CM

## 2015-09-06 DIAGNOSIS — E034 Atrophy of thyroid (acquired): Secondary | ICD-10-CM | POA: Diagnosis not present

## 2015-09-06 DIAGNOSIS — E538 Deficiency of other specified B group vitamins: Secondary | ICD-10-CM | POA: Diagnosis not present

## 2015-09-06 DIAGNOSIS — N183 Chronic kidney disease, stage 3 (moderate): Secondary | ICD-10-CM | POA: Insufficient documentation

## 2015-09-06 DIAGNOSIS — M810 Age-related osteoporosis without current pathological fracture: Secondary | ICD-10-CM | POA: Diagnosis not present

## 2015-09-06 DIAGNOSIS — I083 Combined rheumatic disorders of mitral, aortic and tricuspid valves: Secondary | ICD-10-CM | POA: Insufficient documentation

## 2015-09-06 DIAGNOSIS — M533 Sacrococcygeal disorders, not elsewhere classified: Secondary | ICD-10-CM | POA: Diagnosis not present

## 2015-09-06 DIAGNOSIS — G8929 Other chronic pain: Secondary | ICD-10-CM | POA: Insufficient documentation

## 2015-09-06 DIAGNOSIS — G894 Chronic pain syndrome: Secondary | ICD-10-CM | POA: Insufficient documentation

## 2015-09-06 DIAGNOSIS — M546 Pain in thoracic spine: Secondary | ICD-10-CM | POA: Diagnosis not present

## 2015-09-06 DIAGNOSIS — M4856XA Collapsed vertebra, not elsewhere classified, lumbar region, initial encounter for fracture: Secondary | ICD-10-CM | POA: Insufficient documentation

## 2015-09-06 DIAGNOSIS — M47816 Spondylosis without myelopathy or radiculopathy, lumbar region: Secondary | ICD-10-CM | POA: Diagnosis not present

## 2015-09-06 MED ORDER — ROPIVACAINE HCL 2 MG/ML IJ SOLN: 9.0000 mL | Freq: Once | INTRAMUSCULAR | Status: DC

## 2015-09-06 MED ORDER — LIDOCAINE HCL (PF) 1 % IJ SOLN: 10.0000 mL | Freq: Once | INTRAMUSCULAR | Status: DC

## 2015-09-06 MED ORDER — TRIAMCINOLONE ACETONIDE 40 MG/ML IJ SUSP: 40.0000 mg | Freq: Once | INTRAMUSCULAR | Status: DC

## 2015-09-06 MED ORDER — TRIAMCINOLONE ACETONIDE 40 MG/ML IJ SUSP
INTRAMUSCULAR | Status: AC
  Administered 2015-09-06: 13:00:00
  Filled 2015-09-06: qty 2

## 2015-09-06 MED ORDER — ROPIVACAINE HCL 2 MG/ML IJ SOLN
INTRAMUSCULAR | Status: AC
  Administered 2015-09-06: 13:00:00
  Filled 2015-09-06: qty 20

## 2015-09-06 NOTE — Patient Instructions (Signed)
Pain Management Discharge Instructions  General Discharge Instructions :  If you need to reach your doctor call: Monday-Friday 8:00 am - 4:00 pm at [phone] or toll free [phone].  After clinic hours [phone] to have operator reach doctor.  Bring all of your medication bottles to all your appointments in the pain clinic.  To cancel or reschedule your appointment with Pain Management please remember to call 24 hours in advance to avoid a fee.  Refer to the educational materials which you have been given on: General Risks, I had my Procedure. Discharge Instructions, Post Sedation.  Post Procedure Instructions:  The drugs you were given will stay in your system until tomorrow, so for the next 24 hours you should not drive, make any legal decisions or drink any alcoholic beverages.  You may eat anything you prefer, but it is better to start with liquids then soups and crackers, and gradually work up to solid foods.  Please notify your doctor immediately if you have any unusual bleeding, trouble breathing or pain that is not related to your normal pain.  Depending on the type of procedure that was done, some parts of your body may feel week and/or numb.  This usually clears up by tonight or the next day.  Walk with the use of an assistive device or accompanied by an adult for the 24 hours.  You may use ice on the affected area for the first 24 hours.  Put ice in a Ziploc bag and cover with a towel and place against area 15 minutes on 15 minutes off.  You may switch to heat after 24 hours.GENERAL RISKS AND COMPLICATIONS  What are the risk, side effects and possible complications? Generally speaking, most procedures are safe.  However, with any procedure there are risks, side effects, and the possibility of complications.  The risks and complications are dependent upon the sites that are lesioned, or the type of nerve block to be performed.  The closer the procedure is to the spine,  the more serious the risks are.  Great care is taken when placing the radio frequency needles, block needles or lesioning probes, but sometimes complications can occur. 1. Infection: Any time there is an injection through the skin, there is a risk of infection.  This is why sterile conditions are used for these blocks.  There are four possible types of infection. 1. Localized skin infection. 2. Central Nervous System Infection-This can be in the form of Meningitis, which can be deadly. 3. Epidural Infections-This can be in the form of an epidural abscess, which can cause pressure inside of the spine, causing compression of the spinal cord with subsequent paralysis. This would require an emergency surgery to decompress, and there are no guarantees that the patient would recover from the paralysis. 4. Discitis-This is an infection of the intervertebral discs.  It occurs in about 1% of discography procedures.  It is difficult to treat and it may lead to surgery.        2. Pain: the needles have to go through skin and soft tissues, will cause soreness.       3. Damage to internal structures:  The nerves to be lesioned may be near blood vessels or    other nerves which can be potentially damaged.       4. Bleeding: Bleeding is more common if the patient is taking blood thinners such as  aspirin, Coumadin, Ticiid, Plavix, etc., or if he/she have some genetic predisposition  such as   hemophilia. Bleeding into the spinal canal can cause compression of the spinal  cord with subsequent paralysis.  This would require an emergency surgery to  decompress and there are no guarantees that the patient would recover from the  paralysis.       5. Pneumothorax:  Puncturing of a lung is a possibility, every time a needle is introduced in  the area of the chest or upper back.  Pneumothorax refers to free air around the  collapsed lung(s), inside of the thoracic cavity (chest cavity).  Another two possible  complications  related to a similar event would include: Hemothorax and Chylothorax.   These are variations of the Pneumothorax, where instead of air around the collapsed  lung(s), you may have blood or chyle, respectively.       6. Spinal headaches: They may occur with any procedures in the area of the spine.       7. Persistent CSF (Cerebro-Spinal Fluid) leakage: This is a rare problem, but may occur  with prolonged intrathecal or epidural catheters either due to the formation of a fistulous  track or a dural tear.       8. Nerve damage: By working so close to the spinal cord, there is always a possibility of  nerve damage, which could be as serious as a permanent spinal cord injury with  paralysis.       9. Death:  Although rare, severe deadly allergic reactions known as "Anaphylactic  reaction" can occur to any of the medications used.      10. Worsening of the symptoms:  We can always make thing worse.  What are the chances of something like this happening? Chances of any of this occuring are extremely low.  By statistics, you have more of a chance of getting killed in a motor vehicle accident: while driving to the hospital than any of the above occurring .  Nevertheless, you should be aware that they are possibilities.  In general, it is similar to taking a shower.  Everybody knows that you can slip, hit your head and get killed.  Does that mean that you should not shower again?  Nevertheless always keep in mind that statistics do not mean anything if you happen to be on the wrong side of them.  Even if a procedure has a 1 (one) in a 1,000,000 (million) chance of going wrong, it you happen to be that one..Also, keep in mind that by statistics, you have more of a chance of having something go wrong when taking medications.  Who should not have this procedure? If you are on a blood thinning medication (e.g. Coumadin, Plavix, see list of "Blood Thinners"), or if you have an active infection going on, you should not  have the procedure.  If you are taking any blood thinners, please inform your physician.  How should I prepare for this procedure?  Do not eat or drink anything at least six hours prior to the procedure.  Bring a driver with you .  It cannot be a taxi.  Come accompanied by an adult that can drive you back, and that is strong enough to help you if your legs get weak or numb from the local anesthetic.  Take all of your medicines the morning of the procedure with just enough water to swallow them.  If you have diabetes, make sure that you are scheduled to have your procedure done first thing in the morning, whenever possible.  If you have diabetes,   take only half of your insulin dose and notify our nurse that you have done so as soon as you arrive at the clinic.  If you are diabetic, but only take blood sugar pills (oral hypoglycemic), then do not take them on the morning of your procedure.  You may take them after you have had the procedure.  Do not take aspirin or any aspirin-containing medications, at least eleven (11) days prior to the procedure.  They may prolong bleeding.  Wear loose fitting clothing that may be easy to take off and that you would not mind if it got stained with Betadine or blood.  Do not wear any jewelry or perfume  Remove any nail coloring.  It will interfere with some of our monitoring equipment.  NOTE: Remember that this is not meant to be interpreted as a complete list of all possible complications.  Unforeseen problems may occur.  BLOOD THINNERS The following drugs contain aspirin or other products, which can cause increased bleeding during surgery and should not be taken for 2 weeks prior to and 1 week after surgery.  If you should need take something for relief of minor pain, you may take acetaminophen which is found in Tylenol,m Datril, Anacin-3 and Panadol. It is not blood thinner. The products listed below are.  Do not take any of the products listed below  in addition to any listed on your instruction sheet.  A.P.C or A.P.C with Codeine Codeine Phosphate Capsules #3 Ibuprofen Ridaura  ABC compound Congesprin Imuran rimadil  Advil Cope Indocin Robaxisal  Alka-Seltzer Effervescent Pain Reliever and Antacid Coricidin or Coricidin-D  Indomethacin Rufen  Alka-Seltzer plus Cold Medicine Cosprin Ketoprofen S-A-C Tablets  Anacin Analgesic Tablets or Capsules Coumadin Korlgesic Salflex  Anacin Extra Strength Analgesic tablets or capsules CP-2 Tablets Lanoril Salicylate  Anaprox Cuprimine Capsules Levenox Salocol  Anexsia-D Dalteparin Magan Salsalate  Anodynos Darvon compound Magnesium Salicylate Sine-off  Ansaid Dasin Capsules Magsal Sodium Salicylate  Anturane Depen Capsules Marnal Soma  APF Arthritis pain formula Dewitt's Pills Measurin Stanback  Argesic Dia-Gesic Meclofenamic Sulfinpyrazone  Arthritis Bayer Timed Release Aspirin Diclofenac Meclomen Sulindac  Arthritis pain formula Anacin Dicumarol Medipren Supac  Analgesic (Safety coated) Arthralgen Diffunasal Mefanamic Suprofen  Arthritis Strength Bufferin Dihydrocodeine Mepro Compound Suprol  Arthropan liquid Dopirydamole Methcarbomol with Aspirin Synalgos  ASA tablets/Enseals Disalcid Micrainin Tagament  Ascriptin Doan's Midol Talwin  Ascriptin A/D Dolene Mobidin Tanderil  Ascriptin Extra Strength Dolobid Moblgesic Ticlid  Ascriptin with Codeine Doloprin or Doloprin with Codeine Momentum Tolectin  Asperbuf Duoprin Mono-gesic Trendar  Aspergum Duradyne Motrin or Motrin IB Triminicin  Aspirin plain, buffered or enteric coated Durasal Myochrisine Trigesic  Aspirin Suppositories Easprin Nalfon Trillsate  Aspirin with Codeine Ecotrin Regular or Extra Strength Naprosyn Uracel  Atromid-S Efficin Naproxen Ursinus  Auranofin Capsules Elmiron Neocylate Vanquish  Axotal Emagrin Norgesic Verin  Azathioprine Empirin or Empirin with Codeine Normiflo Vitamin E  Azolid Emprazil Nuprin Voltaren  Bayer  Aspirin plain, buffered or children's or timed BC Tablets or powders Encaprin Orgaran Warfarin Sodium  Buff-a-Comp Enoxaparin Orudis Zorpin  Buff-a-Comp with Codeine Equegesic Os-Cal-Gesic   Buffaprin Excedrin plain, buffered or Extra Strength Oxalid   Bufferin Arthritis Strength Feldene Oxphenbutazone   Bufferin plain or Extra Strength Feldene Capsules Oxycodone with Aspirin   Bufferin with Codeine Fenoprofen Fenoprofen Pabalate or Pabalate-SF   Buffets II Flogesic Panagesic   Buffinol plain or Extra Strength Florinal or Florinal with Codeine Panwarfarin   Buf-Tabs Flurbiprofen Penicillamine   Butalbital Compound Four-way cold tablets   Penicillin   Butazolidin Fragmin Pepto-Bismol   Carbenicillin Geminisyn Percodan   Carna Arthritis Reliever Geopen Persantine   Carprofen Gold's salt Persistin   Chloramphenicol Goody's Phenylbutazone   Chloromycetin Haltrain Piroxlcam   Clmetidine heparin Plaquenil   Cllnoril Hyco-pap Ponstel   Clofibrate Hydroxy chloroquine Propoxyphen         Before stopping any of these medications, be sure to consult the physician who ordered them.  Some, such as Coumadin (Warfarin) are ordered to prevent or treat serious conditions such as "deep thrombosis", "pumonary embolisms", and other heart problems.  The amount of time that you may need off of the medication may also vary with the medication and the reason for which you were taking it.  If you are taking any of these medications, please make sure you notify your pain physician before you undergo any procedures.         Facet Joint Block The facet joints connect the bones of the spine (vertebrae). They make it possible for you to bend, twist, and make other movements with your spine. They also prevent you from overbending, overtwisting, and making other excessive movements.  A facet joint block is a procedure where a numbing medicine (anesthetic) is injected into a facet joint. Often, a type of  anti-inflammatory medicine called a steroid is also injected. A facet joint block may be done for two reasons:   Diagnosis. A facet joint block may be done as a test to see whether neck or back pain is caused by a worn-down or infected facet joint. If the pain gets better after a facet joint block, it means the pain is probably coming from the facet joint. If the pain does not get better, it means the pain is probably not coming from the facet joint.   Therapy. A facet joint block may be done to relieve neck or back pain caused by a facet joint. A facet joint block is only done as a therapy if the pain does not improve with medicine, exercise programs, physical therapy, and other forms of pain management. LET [facility] CARE PROVIDER KNOW ABOUT:   Any allergies you have.   All medicines you are taking, including vitamins, herbs, eyedrops, and over-the-counter medicines and creams.   Previous problems you or members of your family have had with the use of anesthetics.   Any blood disorders you have had.   Other health problems you have. RISKS AND COMPLICATIONS Generally, having a facet joint block is safe. However, as with any procedure, complications can occur. Possible complications associated with having a facet joint block include:   Bleeding.   Injury to a nerve near the injection site.   Pain at the injection site.   Weakness or numbness in areas controlled by nerves near the injection site.   Infection.   Temporary fluid retention.   Allergic reaction to anesthetics or medicines used during the procedure. BEFORE THE PROCEDURE   Follow your health care provider's instructions if you are taking dietary supplements or medicines. You may need to stop taking them or reduce your dosage.   Do not take any new dietary supplements or medicines without asking your health care provider first.   Follow your health care provider's instructions about eating and drinking  before the procedure. You may need to stop eating and drinking several hours before the procedure.   Arrange to have an adult drive you home after the procedure. PROCEDURE  You may need to remove your clothing and  dress in an open-back gown so that your health care provider can access your spine.   The procedure will be done while you are lying on an X-ray table. Most of the time you will be asked to lie on your stomach, but you may be asked to lie in a different position if an injection will be made in your neck.   Special machines will be used to monitor your oxygen levels, heart rate, and blood pressure.   If an injection will be made in your neck, an intravenous (IV) tube will be inserted into one of your veins. Fluids and medicine will flow directly into your body through the IV tube.   The area over the facet joint where the injection will be made will be cleaned with an antiseptic soap. The surrounding skin will be covered with sterile drapes.   An anesthetic will be applied to your skin to make the injection area numb. You may feel a temporary stinging or burning sensation.   A video X-ray machine will be used to locate the joint. A contrast dye may be injected into the facet joint area to help with locating the joint.   When the joint is located, an anesthetic medicine will be injected into the joint through the needle.   Your health care provider will ask you whether you feel pain relief. If you do feel relief, a steroid may be injected to provide pain relief for a longer period of time. If you do not feel relief or feel only partial relief, additional injections of an anesthetic may be made in other facet joints.   The needle will be removed, the skin will be cleansed, and bandages will be applied.  AFTER THE PROCEDURE   You will be observed for 15-30 minutes before being allowed to go home. Do not drive. Have an adult drive you or take a taxi or public transportation  instead.   If you feel pain relief, the pain will return in several hours or days when the anesthetic wears off.   You may feel pain relief 2-14 days after the procedure. The amount of time this relief lasts varies from person to person.   It is normal to feel some tenderness over the injected area(s) for 2 days following the procedure.   If you have diabetes, you may have a temporary increase in blood sugar.   This information is not intended to replace advice given to you by your health care provider. Make sure you discuss any questions you have with your health care provider.   Document Released: 05/16/2006 Document Revised: 01/15/2014 Document Reviewed: 10/15/2011 Elsevier Interactive Patient Education [employer].

## 2015-09-06 NOTE — Progress Notes (Signed)
Safety precautions to be maintained throughout the outpatient stay will include: orient to surroundings, keep bed in low position, maintain call bell within reach at all times, provide assistance with transfer out of bed and ambulation.  

## 2015-09-06 NOTE — Progress Notes (Signed)
Patient's Name: Elizabeth Fields  Patient type: Established  MRN: [id number]  Service setting: Ambulatory outpatient  DOB: Jul 10, 1931  Location: [facility]  DOS: 09/06/2015  Primary Care Physician: No primary care provider on file.  Note by:  A. Dossie Arbour, MD  Referring Physician: No ref. provider found  Specialty: Interventional Pain Management  Last Visit to Pain Management: 07/28/2015   Primary Reason(s) for Visit: Interventional Pain Management Treatment. CC: Back Pain (low and mid back)  Facet syndrome, lumbar [M54.5]   Procedure:  Anesthesia, Analgesia, Anxiolysis:  Type: Diagnostic Medial Branch Facet Block Region: Lumbar Level: L2, L3, L4, L5, & S1 Medial Branch Level(s) Laterality: Bilateral    Type: Local Anesthesia Local Anesthetic: Lidocaine 1% Route: Infiltration ([place]/IM) IV Access: Declined Sedation: Declined  Indication(s): Analgesia       Indications: 1. Lumbar facet syndrome (Bilateral)   2. Chronic low back pain (Bilateral)   3. Lumbar spondylosis, unspecified spinal osteoarthritis   4. Chronic pain     Pre-procedure Pain Score: 2/10 Reported level of pain is compatible with clinical observations Post-procedure Pain Score: 2   Pre-Procedure Assessment:  Elizabeth Fields is a 82 y.o. year old, female patient, seen today for interventional treatment. She has Chronic sacroiliac joint pain (Left); Vitamin B12 deficiency anemia; AI (aortic incompetence); Atrial fibrillation ([place]); Arteriosclerosis of coronary artery; Chronic kidney disease (CKD), stage III (moderate); Beat, premature ventricular; History of cardiac catheterization; Bergmann's syndrome; HLD (hyperlipidemia); Acquired atrophy of thyroid; MI (mitral incompetence); Billowing mitral valve; Osteoporosis, post-menopausal; Breathlessness on exertion; TI (tricuspid incompetence); Lumbar facet syndrome (Bilateral); Chronic low back pain (Bilateral); Compression fracture of L1 lumbar vertebra  ([place]); Compression fracture of T12 vertebra ([place]); Lumbar spondylosis; and Chronic pain on her problem list.. Her primarily concern today is the Back Pain (low and mid back)   Pain Type: Chronic pain Pain Location: Back Pain Orientation: Lower, Mid Pain Descriptors / Indicators: Aching, Constant, Radiating Pain Frequency: Constant  Date of Last Visit: 07/28/15 Service Provided on Last Visit: Evaluation  Coagulation Parameters Lab Results  Component Value Date   INR 1.17 08/25/2014   LABPROT 15.1 (H) 08/25/2014   PLT 332 08/25/2014    Verification of the correct person, correct site (including marking of site), and correct procedure were performed and confirmed by the patient.  Consent: Secured. Under the influence of no sedatives a written informed consent was obtained, after having provided information on the risks and possible complications. To fulfill our ethical and legal obligations, as recommended by the American Medical Association's Code of Ethics, we have provided information to the patient about our clinical impression; the nature and purpose of the treatment or procedure; the risks, benefits, and possible complications of the intervention; alternatives; the risk(s) and benefit(s) of the alternative treatment(s) or procedure(s); and the risk(s) and benefit(s) of doing nothing. The patient was provided information about the risks and possible complications associated with the procedure. These include, but are not limited to, failure to achieve desired goals, infection, bleeding, organ or nerve damage, allergic reactions, paralysis, and death. In the case of spinal procedures these may include, but are not limited to, failure to achieve desired goals, infection, bleeding, organ or nerve damage, allergic reactions, paralysis, and death. In addition, the patient was informed that Medicine is not an exact science; therefore, there is also the possibility of unforeseen risks and possible  complications that may result in a catastrophic outcome. The patient indicated having understood very clearly. We have given the patient no  guarantees and we have made no promises. Enough time was given to the patient to ask questions, all of which were answered to the patient's satisfaction.  Consent Attestation: I, the ordering provider, attest that I have discussed with the patient the benefits, risks, side-effects, alternatives, likelihood of achieving goals, and potential problems during recovery for the procedure that I have provided informed consent.  Pre-Procedure Preparation: Safety Precautions: Allergies reviewed. Appropriate site, procedure, and patient were confirmed by following the Joint Commission's Universal Protocol (UP.01.01.01), in the form of a "Time Out". The patient was asked to confirm marked site and procedure, before commencing. The patient was asked about blood thinners, or active infections, both of which were denied. Patient was assessed for positional comfort and all pressure points were checked before starting procedure. Infection Control Precautions: Sterile technique used. Standard Universal Precautions were taken as recommended by the Department of [facility] for Disease Control and Prevention (CDC). Standard pre-surgical skin prep was conducted. Respiratory hygiene and cough etiquette was practiced. Hand hygiene observed. Safe injection practices and needle disposal techniques followed. SDV (single dose vial) medications used. Medications properly checked for expiration dates and contaminants. Personal protective equipment (PPE) used: Sterile Radiation-resistant gloves. Monitoring:  As per clinic protocol. Vitals:   09/06/15 1335 09/06/15 1341 09/06/15 1346 09/06/15 1348  BP: (!) 141/98 134/82 (!) 147/69 (!) 144/77  Pulse:      Resp: _0 Temp:      SpO2: 98% 99% 99% 100%  Weight:      Height:      Calculated BMI: Body mass index is 19.65  kg/m. Allergies: She is allergic to fosamax [alendronate sodium]; penicillins; and tramadol.Marland Kitchen  Description of Procedure Process:   Time-out: "Time-out" completed before starting procedure, as per protocol. Position: Prone Target Area: For Lumbar Facet blocks, the target is the groove formed by the junction of the transverse process and superior articular process. For the L5 dorsal ramus, the target is the notch between superior articular process and sacral ala. For the S1 dorsal ramus, the target is the superior and lateral edge of the posterior S1 Sacral foramen. Approach: Paramedial approach. Area Prepped: Entire Posterior Lumbosacral Region Prepping solution: ChloraPrep (2% chlorhexidine gluconate and 70% isopropyl alcohol) Safety Precautions: Aspiration looking for blood return was conducted prior to all injections. At no point did we inject any substances, as a needle was being advanced. No attempts were made at seeking any paresthesias. Safe injection practices and needle disposal techniques used. Medications properly checked for expiration dates. SDV (single dose vial) medications used.   Description of the Procedure: Protocol guidelines were followed. The patient was placed in position over the fluoroscopy table. The target area was identified and the area prepped in the usual manner. Skin desensitized using vapocoolant spray. Skin & deeper tissues infiltrated with local anesthetic. Appropriate amount of time allowed to pass for local anesthetics to take effect. The procedure needle was introduced through the skin, ipsilateral to the reported pain, and advanced to the target area. Employing the "Medial Branch Technique", the needles were advanced to the angle made by the superior and medial portion of the transverse process, and the lateral and inferior portion of the superior articulating process of the targeted vertebral bodies. This area is known as "Burton's Eye" or the "Eye of the [place]  Dog". A procedure needle was introduced through the skin, and this time advanced to the angle made by the superior and medial border of the sacral ala, and  the lateral border of the S1 vertebral body. This last needle was later repositioned at the superior and lateral border of the posterior S1 foramen. Negative aspiration confirmed. Solution injected in intermittent fashion, asking for systemic symptoms every 0.5cc of injectate. The needles were then removed and the area cleansed, making sure to leave some of the prepping solution back to take advantage of its long term bactericidal properties. EBL: None Materials & Medications Used:  Needle(s) Used: 22g - 3.5" Spinal Needle(s)  Imaging Guidance:   Type of Imaging Technique: Fluoroscopy Guidance (Spinal) Indication(s): Assistance in needle guidance and placement for procedures requiring needle placement in or near specific anatomical locations not easily accessible without such assistance. Exposure Time: Please see nurses notes. Contrast: None required. Fluoroscopic Guidance: I was personally present in the fluoroscopy suite, where the patient was placed in position for the procedure, over the fluoroscopy-compatible table. Fluoroscopy was manipulated, using "Tunnel Vision Technique", to obtain the best possible view of the target area, on the affected side. Parallax error was corrected before commencing the procedure. A "direction-depth-direction" technique was used to introduce the needle under continuous pulsed fluoroscopic guidance. Once the target was reached, antero-posterior, oblique, and lateral fluoroscopic projection views were taken to confirm needle placement in all planes. Permanently recorded images stored by scanning into EMR. Interpretation: Intraoperative imaging interpretation by performing Physician. Adequate needle placement confirmed. Adequate needle placement confirmed in AP, lateral, & Oblique Views. No contrast  injected.  Antibiotic Prophylaxis:  Indication(s): No indications identified. Type:  Antibiotics Given (last 72 hours)    None       Post-operative Assessment:   Complications: No immediate post-treatment complications were observed. Disposition: Return to clinic for follow-up evaluation. The patient tolerated the entire procedure well. A repeat set of vitals were taken after the procedure and the patient was kept under observation following institutional policy, for this procedure. Post-procedural neurological assessment was performed, showing return to baseline, prior to discharge. The patient was discharged home, once institutional criteria were met. The patient was provided with post-procedure discharge instructions, including a section on how to identify potential problems. Should any problems arise concerning this procedure, the patient was given instructions to immediately contact us, at any time, without hesitation. In any case, we plan to contact the patient by telephone for a follow-up status report regarding this interventional procedure. Comments:  No additional relevant information.  Plan of Care   Problem List Items Addressed This Visit      High   Chronic low back pain (Bilateral) (Chronic)   Relevant Medications   naproxen (NAPROSYN) 375 MG tablet   triamcinolone acetonide (KENALOG-40) 40 MG/ML injection (Completed)   triamcinolone acetonide (KENALOG-40) injection 40 mg   Chronic pain (Chronic)   Relevant Medications   naproxen (NAPROSYN) 375 MG tablet   ropivacaine (PF) 2 mg/ml (0.2%) (NAROPIN) 2 MG/ML epidural (Completed)   triamcinolone acetonide (KENALOG-40) 40 MG/ML injection (Completed)   triamcinolone acetonide (KENALOG-40) injection 40 mg   lidocaine (PF) (XYLOCAINE) 1 % injection 10 mL   ropivacaine (PF) 2 mg/ml (0.2%) (NAROPIN) epidural 9 mL   Lumbar facet syndrome (Bilateral) - Primary (Chronic)   Relevant Medications   naproxen (NAPROSYN) 375 MG tablet    triamcinolone acetonide (KENALOG-40) 40 MG/ML injection (Completed)   triamcinolone acetonide (KENALOG-40) injection 40 mg   lidocaine (PF) (XYLOCAINE) 1 % injection 10 mL   ropivacaine (PF) 2 mg/ml (0.2%) (NAROPIN) epidural 9 mL   Other Relevant Orders   LUMBAR FACET(MEDIAL BRANCH NERVE BLOCK) MBNB  Lumbar spondylosis (Chronic)   Relevant Medications   naproxen (NAPROSYN) 375 MG tablet   triamcinolone acetonide (KENALOG-40) 40 MG/ML injection (Completed)   triamcinolone acetonide (KENALOG-40) injection 40 mg    Other Visit Diagnoses   None.     Requested PM Follow-up: Return in about 2 weeks (around 09/20/2015) for Post-Procedure evaluation.  Future Appointments Date Time Provider [place]  09/14/2015 11:00 AM [facility]  09/29/2015 9:00 AM Milinda Pointer, MD [facility] None    Primary Care Physician: No primary care provider on file. Location: [facility] Note by:  A. Dossie Arbour, M.D, DABA, DABAPM, DABPM, DABIPP, FIPP   Illustration of the posterior view of the lumbar spine and the posterior neural structures. Laminae of L2 through S1 are labeled. DPRL5, dorsal primary ramus of L5; DPRS1, dorsal primary ramus of S1; DPR3, dorsal primary ramus of L3; FJ, facet (zygapophyseal) joint L3-L4; I, inferior articular process of L4; LB1, lateral branch of dorsal primary ramus of L1; IAB, inferior articular branches from L3 medial branch (supplies L4-L5 facet joint); IBP, intermediate branch plexus; MB3, medial branch of dorsal primary ramus of L3; NR3, third lumbar nerve root; S, superior articular process of L5; SAB, superior articular branches from L4 (supplies L4-5 facet joint also); TP3, transverse process of L3.  Disclaimer:  Medicine is not an Chief Strategy Officer. The only guarantee in medicine is that nothing is guaranteed. It is important to note that the decision to proceed with this intervention was based on the information  collected from the patient. The Data and conclusions were drawn from the patient's questionnaire, the interview, and the physical examination. Because the information was provided in large part by the patient, it cannot be guaranteed that it has not been purposely or unconsciously manipulated. Every effort has been made to obtain as much relevant data as possible for this evaluation. It is important to note that the conclusions that lead to this procedure are derived in large part from the available data. Always take into account that the treatment will also be dependent on availability of resources and existing treatment guidelines, considered by other Pain Management Practitioners as being common knowledge and practice, at the time of the intervention. For Medico-Legal purposes, it is also important to point out that variation in procedural techniques and pharmacological choices are the acceptable norm. The indications, contraindications, technique, and results of the above procedure should only be interpreted and judged by a Board-Certified Interventional Pain Specialist with extensive familiarity and expertise in the same exact procedure and technique. Attempts at providing opinions without similar or greater experience and expertise than that of the treating physician will be considered as inappropriate and unethical, and shall result in a formal complaint to the state medical board and applicable specialty societies.

## 2015-09-07 ENCOUNTER — Telehealth: Payer: Self-pay | Admitting: *Deleted

## 2015-09-07 NOTE — Telephone Encounter (Signed)
Denies complications post procedure. 

## 2015-09-14 ENCOUNTER — Ambulatory Visit
Admission: RE | Admit: 2015-09-14 | Discharge: 2015-09-14 | Disposition: A | Discharge status: Home / Self Care | Payer: PPO | Source: Ambulatory Visit | Attending: Pain Medicine | Admitting: Pain Medicine

## 2015-09-14 DIAGNOSIS — G8929 Other chronic pain: Secondary | ICD-10-CM

## 2015-09-14 DIAGNOSIS — I700 Atherosclerosis of aorta: Secondary | ICD-10-CM | POA: Insufficient documentation

## 2015-09-14 DIAGNOSIS — M4856XA Collapsed vertebra, not elsewhere classified, lumbar region, initial encounter for fracture: Secondary | ICD-10-CM | POA: Diagnosis not present

## 2015-09-14 DIAGNOSIS — M545 Low back pain: Secondary | ICD-10-CM

## 2015-09-14 DIAGNOSIS — M8580 Other specified disorders of bone density and structure, unspecified site: Secondary | ICD-10-CM | POA: Insufficient documentation

## 2015-09-14 DIAGNOSIS — M5136 Other intervertebral disc degeneration, lumbar region: Secondary | ICD-10-CM | POA: Insufficient documentation

## 2015-09-14 DIAGNOSIS — S32010S Wedge compression fracture of first lumbar vertebra, sequela: Secondary | ICD-10-CM

## 2015-09-14 DIAGNOSIS — S22080S Wedge compression fracture of T11-T12 vertebra, sequela: Secondary | ICD-10-CM

## 2015-09-14 DIAGNOSIS — S3992XA Unspecified injury of lower back, initial encounter: Secondary | ICD-10-CM | POA: Diagnosis not present

## 2015-09-18 ENCOUNTER — Encounter: Payer: Self-pay | Admitting: Pain Medicine

## 2015-09-18 DIAGNOSIS — S32040A Wedge compression fracture of fourth lumbar vertebra, initial encounter for closed fracture: Secondary | ICD-10-CM | POA: Insufficient documentation

## 2015-09-18 NOTE — Progress Notes (Signed)
Results were reviewed and found to be: abnormal    Review would suggest interventional pain management techniques may be of benefit  Surgical consultation may be of benefit to further evaluate the "Age indeterminate L4 mild compression fracture".

## 2015-09-19 DIAGNOSIS — D519 Vitamin B12 deficiency anemia, unspecified: Secondary | ICD-10-CM | POA: Diagnosis not present

## 2015-09-19 DIAGNOSIS — I480 Paroxysmal atrial fibrillation: Secondary | ICD-10-CM | POA: Diagnosis not present

## 2015-09-28 DIAGNOSIS — Z23 Encounter for immunization: Secondary | ICD-10-CM | POA: Diagnosis not present

## 2015-09-29 ENCOUNTER — Ambulatory Visit: Payer: PPO | Attending: Pain Medicine | Admitting: Pain Medicine

## 2015-09-29 ENCOUNTER — Encounter: Payer: Self-pay | Admitting: Pain Medicine

## 2015-09-29 VITALS — BP 136/68 | HR 64 | Temp 97.8°F | Resp 16 | Ht 61.0 in | Wt 104.0 lb

## 2015-09-29 DIAGNOSIS — S32010S Wedge compression fracture of first lumbar vertebra, sequela: Secondary | ICD-10-CM

## 2015-09-29 DIAGNOSIS — M4854XA Collapsed vertebra, not elsewhere classified, thoracic region, initial encounter for fracture: Secondary | ICD-10-CM | POA: Insufficient documentation

## 2015-09-29 DIAGNOSIS — X58XXXA Exposure to other specified factors, initial encounter: Secondary | ICD-10-CM | POA: Diagnosis not present

## 2015-09-29 DIAGNOSIS — Z7901 Long term (current) use of anticoagulants: Secondary | ICD-10-CM | POA: Insufficient documentation

## 2015-09-29 DIAGNOSIS — Z79899 Other long term (current) drug therapy: Secondary | ICD-10-CM | POA: Diagnosis not present

## 2015-09-29 DIAGNOSIS — M546 Pain in thoracic spine: Secondary | ICD-10-CM | POA: Diagnosis not present

## 2015-09-29 DIAGNOSIS — I4891 Unspecified atrial fibrillation: Secondary | ICD-10-CM | POA: Insufficient documentation

## 2015-09-29 DIAGNOSIS — S22080S Wedge compression fracture of T11-T12 vertebra, sequela: Secondary | ICD-10-CM

## 2015-09-29 DIAGNOSIS — E034 Atrophy of thyroid (acquired): Secondary | ICD-10-CM | POA: Insufficient documentation

## 2015-09-29 DIAGNOSIS — M4854XS Collapsed vertebra, not elsewhere classified, thoracic region, sequela of fracture: Secondary | ICD-10-CM

## 2015-09-29 DIAGNOSIS — I2510 Atherosclerotic heart disease of native coronary artery without angina pectoris: Secondary | ICD-10-CM | POA: Diagnosis not present

## 2015-09-29 DIAGNOSIS — M545 Low back pain, unspecified: Secondary | ICD-10-CM

## 2015-09-29 DIAGNOSIS — Z7982 Long term (current) use of aspirin: Secondary | ICD-10-CM | POA: Diagnosis not present

## 2015-09-29 DIAGNOSIS — S32040S Wedge compression fracture of fourth lumbar vertebra, sequela: Secondary | ICD-10-CM | POA: Diagnosis not present

## 2015-09-29 DIAGNOSIS — G8929 Other chronic pain: Secondary | ICD-10-CM | POA: Insufficient documentation

## 2015-09-29 DIAGNOSIS — E785 Hyperlipidemia, unspecified: Secondary | ICD-10-CM | POA: Insufficient documentation

## 2015-09-29 DIAGNOSIS — N183 Chronic kidney disease, stage 3 (moderate): Secondary | ICD-10-CM | POA: Insufficient documentation

## 2015-09-29 NOTE — Progress Notes (Signed)
Safety precautions to be maintained throughout the outpatient stay will include: orient to surroundings, keep bed in low position, maintain call bell within reach at all times, provide assistance with transfer out of bed and ambulation.  
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
## 2015-10-20 DIAGNOSIS — I482 Chronic atrial fibrillation: Secondary | ICD-10-CM | POA: Diagnosis not present

## 2015-10-20 DIAGNOSIS — D519 Vitamin B12 deficiency anemia, unspecified: Secondary | ICD-10-CM | POA: Diagnosis not present

## 2015-10-31 DIAGNOSIS — Z9889 Other specified postprocedural states: Secondary | ICD-10-CM | POA: Diagnosis not present

## 2015-10-31 DIAGNOSIS — I351 Nonrheumatic aortic (valve) insufficiency: Secondary | ICD-10-CM | POA: Diagnosis not present

## 2015-10-31 DIAGNOSIS — E7800 Pure hypercholesterolemia, unspecified: Secondary | ICD-10-CM | POA: Diagnosis not present

## 2015-10-31 DIAGNOSIS — R0602 Shortness of breath: Secondary | ICD-10-CM | POA: Diagnosis not present

## 2015-10-31 DIAGNOSIS — I2510 Atherosclerotic heart disease of native coronary artery without angina pectoris: Secondary | ICD-10-CM | POA: Diagnosis not present

## 2015-10-31 DIAGNOSIS — I493 Ventricular premature depolarization: Secondary | ICD-10-CM | POA: Diagnosis not present

## 2015-10-31 DIAGNOSIS — I340 Nonrheumatic mitral (valve) insufficiency: Secondary | ICD-10-CM | POA: Diagnosis not present

## 2015-10-31 DIAGNOSIS — I341 Nonrheumatic mitral (valve) prolapse: Secondary | ICD-10-CM | POA: Diagnosis not present

## 2015-10-31 DIAGNOSIS — I361 Nonrheumatic tricuspid (valve) insufficiency: Secondary | ICD-10-CM | POA: Diagnosis not present

## 2015-10-31 DIAGNOSIS — I480 Paroxysmal atrial fibrillation: Secondary | ICD-10-CM | POA: Diagnosis not present

## 2015-11-17 DIAGNOSIS — I480 Paroxysmal atrial fibrillation: Secondary | ICD-10-CM | POA: Diagnosis not present

## 2015-11-21 DIAGNOSIS — D519 Vitamin B12 deficiency anemia, unspecified: Secondary | ICD-10-CM | POA: Diagnosis not present

## 2015-12-15 DIAGNOSIS — I480 Paroxysmal atrial fibrillation: Secondary | ICD-10-CM | POA: Diagnosis not present

## 2015-12-22 DIAGNOSIS — D519 Vitamin B12 deficiency anemia, unspecified: Secondary | ICD-10-CM | POA: Diagnosis not present

## 2016-01-12 DIAGNOSIS — I480 Paroxysmal atrial fibrillation: Secondary | ICD-10-CM | POA: Diagnosis not present

## 2016-01-23 ENCOUNTER — Other Ambulatory Visit: Payer: Self-pay | Admitting: Internal Medicine

## 2016-01-23 DIAGNOSIS — D519 Vitamin B12 deficiency anemia, unspecified: Secondary | ICD-10-CM | POA: Diagnosis not present

## 2016-01-23 DIAGNOSIS — Z1231 Encounter for screening mammogram for malignant neoplasm of breast: Secondary | ICD-10-CM

## 2016-02-06 DIAGNOSIS — N183 Chronic kidney disease, stage 3 (moderate): Secondary | ICD-10-CM | POA: Diagnosis not present

## 2016-02-06 DIAGNOSIS — R601 Generalized edema: Secondary | ICD-10-CM | POA: Diagnosis not present

## 2016-02-06 DIAGNOSIS — I129 Hypertensive chronic kidney disease with stage 1 through stage 4 chronic kidney disease, or unspecified chronic kidney disease: Secondary | ICD-10-CM | POA: Diagnosis not present

## 2016-02-06 DIAGNOSIS — N2581 Secondary hyperparathyroidism of renal origin: Secondary | ICD-10-CM | POA: Diagnosis not present

## 2016-02-09 DIAGNOSIS — M810 Age-related osteoporosis without current pathological fracture: Secondary | ICD-10-CM | POA: Diagnosis not present

## 2016-02-09 DIAGNOSIS — I480 Paroxysmal atrial fibrillation: Secondary | ICD-10-CM | POA: Diagnosis not present

## 2016-02-09 DIAGNOSIS — E034 Atrophy of thyroid (acquired): Secondary | ICD-10-CM | POA: Diagnosis not present

## 2016-02-13 DIAGNOSIS — Z0001 Encounter for general adult medical examination with abnormal findings: Secondary | ICD-10-CM | POA: Diagnosis not present

## 2016-02-23 ENCOUNTER — Ambulatory Visit
Admission: RE | Admit: 2016-02-23 | Discharge: 2016-02-23 | Disposition: A | Discharge status: Home / Self Care | Payer: PPO | Source: Ambulatory Visit | Attending: Internal Medicine | Admitting: Internal Medicine

## 2016-02-23 DIAGNOSIS — Z1231 Encounter for screening mammogram for malignant neoplasm of breast: Secondary | ICD-10-CM | POA: Insufficient documentation

## 2016-02-27 DIAGNOSIS — R0602 Shortness of breath: Secondary | ICD-10-CM | POA: Diagnosis not present

## 2016-02-27 DIAGNOSIS — Z9889 Other specified postprocedural states: Secondary | ICD-10-CM | POA: Diagnosis not present

## 2016-02-27 DIAGNOSIS — I361 Nonrheumatic tricuspid (valve) insufficiency: Secondary | ICD-10-CM | POA: Diagnosis not present

## 2016-02-27 DIAGNOSIS — I351 Nonrheumatic aortic (valve) insufficiency: Secondary | ICD-10-CM | POA: Diagnosis not present

## 2016-02-27 DIAGNOSIS — I480 Paroxysmal atrial fibrillation: Secondary | ICD-10-CM | POA: Diagnosis not present

## 2016-02-27 DIAGNOSIS — R079 Chest pain, unspecified: Secondary | ICD-10-CM | POA: Diagnosis not present

## 2016-02-27 DIAGNOSIS — I341 Nonrheumatic mitral (valve) prolapse: Secondary | ICD-10-CM | POA: Diagnosis not present

## 2016-02-27 DIAGNOSIS — E7800 Pure hypercholesterolemia, unspecified: Secondary | ICD-10-CM | POA: Diagnosis not present

## 2016-02-27 DIAGNOSIS — I493 Ventricular premature depolarization: Secondary | ICD-10-CM | POA: Diagnosis not present

## 2016-02-27 DIAGNOSIS — D519 Vitamin B12 deficiency anemia, unspecified: Secondary | ICD-10-CM | POA: Diagnosis not present

## 2016-03-05 DIAGNOSIS — M810 Age-related osteoporosis without current pathological fracture: Secondary | ICD-10-CM | POA: Diagnosis not present

## 2016-03-08 DIAGNOSIS — I480 Paroxysmal atrial fibrillation: Secondary | ICD-10-CM | POA: Diagnosis not present

## 2016-03-20 DIAGNOSIS — I341 Nonrheumatic mitral (valve) prolapse: Secondary | ICD-10-CM | POA: Diagnosis not present

## 2016-03-20 DIAGNOSIS — R0602 Shortness of breath: Secondary | ICD-10-CM | POA: Diagnosis not present

## 2016-03-20 DIAGNOSIS — I361 Nonrheumatic tricuspid (valve) insufficiency: Secondary | ICD-10-CM | POA: Diagnosis not present

## 2016-03-20 DIAGNOSIS — I351 Nonrheumatic aortic (valve) insufficiency: Secondary | ICD-10-CM | POA: Diagnosis not present

## 2016-03-27 DIAGNOSIS — R079 Chest pain, unspecified: Secondary | ICD-10-CM | POA: Diagnosis not present

## 2016-03-27 DIAGNOSIS — R0602 Shortness of breath: Secondary | ICD-10-CM | POA: Diagnosis not present

## 2016-03-27 DIAGNOSIS — Z9889 Other specified postprocedural states: Secondary | ICD-10-CM | POA: Diagnosis not present

## 2016-03-29 DIAGNOSIS — E538 Deficiency of other specified B group vitamins: Secondary | ICD-10-CM | POA: Diagnosis not present

## 2016-04-02 DIAGNOSIS — R079 Chest pain, unspecified: Secondary | ICD-10-CM | POA: Diagnosis not present

## 2016-04-02 DIAGNOSIS — I480 Paroxysmal atrial fibrillation: Secondary | ICD-10-CM | POA: Diagnosis not present

## 2016-04-02 DIAGNOSIS — I340 Nonrheumatic mitral (valve) insufficiency: Secondary | ICD-10-CM | POA: Diagnosis not present

## 2016-04-02 DIAGNOSIS — I361 Nonrheumatic tricuspid (valve) insufficiency: Secondary | ICD-10-CM | POA: Diagnosis not present

## 2016-04-02 DIAGNOSIS — I493 Ventricular premature depolarization: Secondary | ICD-10-CM | POA: Diagnosis not present

## 2016-04-02 DIAGNOSIS — R0602 Shortness of breath: Secondary | ICD-10-CM | POA: Diagnosis not present

## 2016-04-02 DIAGNOSIS — I341 Nonrheumatic mitral (valve) prolapse: Secondary | ICD-10-CM | POA: Diagnosis not present

## 2016-04-02 DIAGNOSIS — E785 Hyperlipidemia, unspecified: Secondary | ICD-10-CM | POA: Diagnosis not present

## 2016-04-02 DIAGNOSIS — I2510 Atherosclerotic heart disease of native coronary artery without angina pectoris: Secondary | ICD-10-CM | POA: Diagnosis not present

## 2016-04-02 DIAGNOSIS — Z9889 Other specified postprocedural states: Secondary | ICD-10-CM | POA: Diagnosis not present

## 2016-04-02 DIAGNOSIS — I351 Nonrheumatic aortic (valve) insufficiency: Secondary | ICD-10-CM | POA: Diagnosis not present

## 2016-04-02 DIAGNOSIS — N183 Chronic kidney disease, stage 3 (moderate): Secondary | ICD-10-CM | POA: Diagnosis not present

## 2016-04-05 DIAGNOSIS — I480 Paroxysmal atrial fibrillation: Secondary | ICD-10-CM | POA: Diagnosis not present

## 2016-04-09 DIAGNOSIS — Z1283 Encounter for screening for malignant neoplasm of skin: Secondary | ICD-10-CM | POA: Diagnosis not present

## 2016-04-09 DIAGNOSIS — D692 Other nonthrombocytopenic purpura: Secondary | ICD-10-CM | POA: Diagnosis not present

## 2016-04-09 DIAGNOSIS — B078 Other viral warts: Secondary | ICD-10-CM | POA: Diagnosis not present

## 2016-04-09 DIAGNOSIS — L812 Freckles: Secondary | ICD-10-CM | POA: Diagnosis not present

## 2016-04-09 DIAGNOSIS — I8393 Asymptomatic varicose veins of bilateral lower extremities: Secondary | ICD-10-CM | POA: Diagnosis not present

## 2016-04-09 DIAGNOSIS — I781 Nevus, non-neoplastic: Secondary | ICD-10-CM | POA: Diagnosis not present

## 2016-04-09 DIAGNOSIS — L570 Actinic keratosis: Secondary | ICD-10-CM | POA: Diagnosis not present

## 2016-04-09 DIAGNOSIS — Z85828 Personal history of other malignant neoplasm of skin: Secondary | ICD-10-CM | POA: Diagnosis not present

## 2016-04-09 DIAGNOSIS — L821 Other seborrheic keratosis: Secondary | ICD-10-CM | POA: Diagnosis not present

## 2016-04-09 DIAGNOSIS — L820 Inflamed seborrheic keratosis: Secondary | ICD-10-CM | POA: Diagnosis not present

## 2016-04-09 DIAGNOSIS — L578 Other skin changes due to chronic exposure to nonionizing radiation: Secondary | ICD-10-CM | POA: Diagnosis not present

## 2016-04-30 DIAGNOSIS — E538 Deficiency of other specified B group vitamins: Secondary | ICD-10-CM | POA: Diagnosis not present

## 2016-05-03 DIAGNOSIS — I480 Paroxysmal atrial fibrillation: Secondary | ICD-10-CM | POA: Diagnosis not present

## 2016-05-21 DIAGNOSIS — L812 Freckles: Secondary | ICD-10-CM | POA: Diagnosis not present

## 2016-05-21 DIAGNOSIS — L570 Actinic keratosis: Secondary | ICD-10-CM | POA: Diagnosis not present

## 2016-05-21 DIAGNOSIS — L578 Other skin changes due to chronic exposure to nonionizing radiation: Secondary | ICD-10-CM | POA: Diagnosis not present

## 2016-05-21 DIAGNOSIS — L821 Other seborrheic keratosis: Secondary | ICD-10-CM | POA: Diagnosis not present

## 2016-05-22 DIAGNOSIS — Z961 Presence of intraocular lens: Secondary | ICD-10-CM | POA: Diagnosis not present

## 2016-05-31 DIAGNOSIS — I482 Chronic atrial fibrillation: Secondary | ICD-10-CM | POA: Diagnosis not present

## 2016-06-05 DIAGNOSIS — D519 Vitamin B12 deficiency anemia, unspecified: Secondary | ICD-10-CM | POA: Diagnosis not present

## 2016-06-28 DIAGNOSIS — I480 Paroxysmal atrial fibrillation: Secondary | ICD-10-CM | POA: Diagnosis not present

## 2016-07-06 DIAGNOSIS — D519 Vitamin B12 deficiency anemia, unspecified: Secondary | ICD-10-CM | POA: Diagnosis not present

## 2016-07-26 DIAGNOSIS — I480 Paroxysmal atrial fibrillation: Secondary | ICD-10-CM | POA: Diagnosis not present

## 2016-07-30 DIAGNOSIS — I361 Nonrheumatic tricuspid (valve) insufficiency: Secondary | ICD-10-CM | POA: Diagnosis not present

## 2016-07-30 DIAGNOSIS — Z9889 Other specified postprocedural states: Secondary | ICD-10-CM | POA: Diagnosis not present

## 2016-07-30 DIAGNOSIS — I2510 Atherosclerotic heart disease of native coronary artery without angina pectoris: Secondary | ICD-10-CM | POA: Diagnosis not present

## 2016-07-30 DIAGNOSIS — I340 Nonrheumatic mitral (valve) insufficiency: Secondary | ICD-10-CM | POA: Diagnosis not present

## 2016-07-30 DIAGNOSIS — E785 Hyperlipidemia, unspecified: Secondary | ICD-10-CM | POA: Diagnosis not present

## 2016-07-30 DIAGNOSIS — I480 Paroxysmal atrial fibrillation: Secondary | ICD-10-CM | POA: Diagnosis not present

## 2016-07-30 DIAGNOSIS — I341 Nonrheumatic mitral (valve) prolapse: Secondary | ICD-10-CM | POA: Diagnosis not present

## 2016-07-30 DIAGNOSIS — R079 Chest pain, unspecified: Secondary | ICD-10-CM | POA: Diagnosis not present

## 2016-07-30 DIAGNOSIS — I493 Ventricular premature depolarization: Secondary | ICD-10-CM | POA: Diagnosis not present

## 2016-08-06 DIAGNOSIS — D631 Anemia in chronic kidney disease: Secondary | ICD-10-CM | POA: Diagnosis not present

## 2016-08-06 DIAGNOSIS — R319 Hematuria, unspecified: Secondary | ICD-10-CM | POA: Diagnosis not present

## 2016-08-06 DIAGNOSIS — D538 Other specified nutritional anemias: Secondary | ICD-10-CM | POA: Diagnosis not present

## 2016-08-06 DIAGNOSIS — N2581 Secondary hyperparathyroidism of renal origin: Secondary | ICD-10-CM | POA: Diagnosis not present

## 2016-08-06 DIAGNOSIS — N183 Chronic kidney disease, stage 3 (moderate): Secondary | ICD-10-CM | POA: Diagnosis not present

## 2016-08-06 DIAGNOSIS — I129 Hypertensive chronic kidney disease with stage 1 through stage 4 chronic kidney disease, or unspecified chronic kidney disease: Secondary | ICD-10-CM | POA: Diagnosis not present

## 2016-08-06 DIAGNOSIS — I10 Essential (primary) hypertension: Secondary | ICD-10-CM | POA: Diagnosis not present

## 2016-08-06 DIAGNOSIS — N189 Chronic kidney disease, unspecified: Secondary | ICD-10-CM | POA: Diagnosis not present

## 2016-08-06 DIAGNOSIS — R600 Localized edema: Secondary | ICD-10-CM | POA: Diagnosis not present

## 2016-08-13 DIAGNOSIS — N183 Chronic kidney disease, stage 3 (moderate): Secondary | ICD-10-CM | POA: Diagnosis not present

## 2016-08-13 DIAGNOSIS — N2581 Secondary hyperparathyroidism of renal origin: Secondary | ICD-10-CM | POA: Diagnosis not present

## 2016-08-13 DIAGNOSIS — I129 Hypertensive chronic kidney disease with stage 1 through stage 4 chronic kidney disease, or unspecified chronic kidney disease: Secondary | ICD-10-CM | POA: Diagnosis not present

## 2016-08-20 DIAGNOSIS — N183 Chronic kidney disease, stage 3 (moderate): Secondary | ICD-10-CM | POA: Diagnosis not present

## 2016-08-20 DIAGNOSIS — Z0000 Encounter for general adult medical examination without abnormal findings: Secondary | ICD-10-CM | POA: Diagnosis not present

## 2016-08-20 DIAGNOSIS — I480 Paroxysmal atrial fibrillation: Secondary | ICD-10-CM | POA: Diagnosis not present

## 2016-08-20 DIAGNOSIS — E034 Atrophy of thyroid (acquired): Secondary | ICD-10-CM | POA: Diagnosis not present

## 2016-08-20 DIAGNOSIS — I2510 Atherosclerotic heart disease of native coronary artery without angina pectoris: Secondary | ICD-10-CM | POA: Diagnosis not present

## 2016-08-22 DIAGNOSIS — B078 Other viral warts: Secondary | ICD-10-CM | POA: Diagnosis not present

## 2016-08-22 DIAGNOSIS — L570 Actinic keratosis: Secondary | ICD-10-CM | POA: Diagnosis not present

## 2016-08-22 DIAGNOSIS — L578 Other skin changes due to chronic exposure to nonionizing radiation: Secondary | ICD-10-CM | POA: Diagnosis not present

## 2016-09-06 DIAGNOSIS — I480 Paroxysmal atrial fibrillation: Secondary | ICD-10-CM | POA: Diagnosis not present

## 2016-09-06 DIAGNOSIS — E538 Deficiency of other specified B group vitamins: Secondary | ICD-10-CM | POA: Diagnosis not present

## 2016-09-28 DIAGNOSIS — Z23 Encounter for immunization: Secondary | ICD-10-CM | POA: Diagnosis not present

## 2016-10-04 DIAGNOSIS — I480 Paroxysmal atrial fibrillation: Secondary | ICD-10-CM | POA: Diagnosis not present

## 2016-10-08 DIAGNOSIS — E538 Deficiency of other specified B group vitamins: Secondary | ICD-10-CM | POA: Diagnosis not present

## 2016-11-01 DIAGNOSIS — I480 Paroxysmal atrial fibrillation: Secondary | ICD-10-CM | POA: Diagnosis not present

## 2016-11-12 DIAGNOSIS — D519 Vitamin B12 deficiency anemia, unspecified: Secondary | ICD-10-CM | POA: Diagnosis not present

## 2016-12-06 DIAGNOSIS — I480 Paroxysmal atrial fibrillation: Secondary | ICD-10-CM | POA: Diagnosis not present

## 2016-12-13 DIAGNOSIS — I480 Paroxysmal atrial fibrillation: Secondary | ICD-10-CM | POA: Diagnosis not present

## 2017-01-03 DIAGNOSIS — I480 Paroxysmal atrial fibrillation: Secondary | ICD-10-CM | POA: Diagnosis not present

## 2017-01-14 ENCOUNTER — Other Ambulatory Visit: Payer: Self-pay | Admitting: Internal Medicine

## 2017-01-14 DIAGNOSIS — E538 Deficiency of other specified B group vitamins: Secondary | ICD-10-CM | POA: Diagnosis not present

## 2017-01-14 DIAGNOSIS — Z1231 Encounter for screening mammogram for malignant neoplasm of breast: Secondary | ICD-10-CM

## 2017-01-28 DIAGNOSIS — I340 Nonrheumatic mitral (valve) insufficiency: Secondary | ICD-10-CM | POA: Diagnosis not present

## 2017-01-28 DIAGNOSIS — R0602 Shortness of breath: Secondary | ICD-10-CM | POA: Diagnosis not present

## 2017-01-28 DIAGNOSIS — R079 Chest pain, unspecified: Secondary | ICD-10-CM | POA: Diagnosis not present

## 2017-01-28 DIAGNOSIS — I351 Nonrheumatic aortic (valve) insufficiency: Secondary | ICD-10-CM | POA: Diagnosis not present

## 2017-01-28 DIAGNOSIS — I361 Nonrheumatic tricuspid (valve) insufficiency: Secondary | ICD-10-CM | POA: Diagnosis not present

## 2017-01-28 DIAGNOSIS — E785 Hyperlipidemia, unspecified: Secondary | ICD-10-CM | POA: Diagnosis not present

## 2017-01-28 DIAGNOSIS — Z9889 Other specified postprocedural states: Secondary | ICD-10-CM | POA: Diagnosis not present

## 2017-01-28 DIAGNOSIS — I2510 Atherosclerotic heart disease of native coronary artery without angina pectoris: Secondary | ICD-10-CM | POA: Diagnosis not present

## 2017-01-28 DIAGNOSIS — I480 Paroxysmal atrial fibrillation: Secondary | ICD-10-CM | POA: Diagnosis not present

## 2017-01-28 DIAGNOSIS — I493 Ventricular premature depolarization: Secondary | ICD-10-CM | POA: Diagnosis not present

## 2017-01-28 DIAGNOSIS — I341 Nonrheumatic mitral (valve) prolapse: Secondary | ICD-10-CM | POA: Diagnosis not present

## 2017-01-31 DIAGNOSIS — I480 Paroxysmal atrial fibrillation: Secondary | ICD-10-CM | POA: Diagnosis not present

## 2017-02-05 DIAGNOSIS — I129 Hypertensive chronic kidney disease with stage 1 through stage 4 chronic kidney disease, or unspecified chronic kidney disease: Secondary | ICD-10-CM | POA: Diagnosis not present

## 2017-02-05 DIAGNOSIS — N183 Chronic kidney disease, stage 3 (moderate): Secondary | ICD-10-CM | POA: Diagnosis not present

## 2017-02-05 DIAGNOSIS — D631 Anemia in chronic kidney disease: Secondary | ICD-10-CM | POA: Diagnosis not present

## 2017-02-05 DIAGNOSIS — N2581 Secondary hyperparathyroidism of renal origin: Secondary | ICD-10-CM | POA: Diagnosis not present

## 2017-02-05 DIAGNOSIS — R600 Localized edema: Secondary | ICD-10-CM | POA: Diagnosis not present

## 2017-02-05 DIAGNOSIS — R319 Hematuria, unspecified: Secondary | ICD-10-CM | POA: Diagnosis not present

## 2017-02-05 DIAGNOSIS — I10 Essential (primary) hypertension: Secondary | ICD-10-CM | POA: Diagnosis not present

## 2017-02-11 DIAGNOSIS — N183 Chronic kidney disease, stage 3 (moderate): Secondary | ICD-10-CM | POA: Diagnosis not present

## 2017-02-11 DIAGNOSIS — I129 Hypertensive chronic kidney disease with stage 1 through stage 4 chronic kidney disease, or unspecified chronic kidney disease: Secondary | ICD-10-CM | POA: Diagnosis not present

## 2017-02-11 DIAGNOSIS — N2581 Secondary hyperparathyroidism of renal origin: Secondary | ICD-10-CM | POA: Diagnosis not present

## 2017-02-14 DIAGNOSIS — E538 Deficiency of other specified B group vitamins: Secondary | ICD-10-CM | POA: Diagnosis not present

## 2017-02-18 DIAGNOSIS — N183 Chronic kidney disease, stage 3 (moderate): Secondary | ICD-10-CM | POA: Diagnosis not present

## 2017-02-18 DIAGNOSIS — E034 Atrophy of thyroid (acquired): Secondary | ICD-10-CM | POA: Diagnosis not present

## 2017-02-25 DIAGNOSIS — C44329 Squamous cell carcinoma of skin of other parts of face: Secondary | ICD-10-CM | POA: Diagnosis not present

## 2017-02-25 DIAGNOSIS — I480 Paroxysmal atrial fibrillation: Secondary | ICD-10-CM | POA: Diagnosis not present

## 2017-02-25 DIAGNOSIS — E034 Atrophy of thyroid (acquired): Secondary | ICD-10-CM | POA: Diagnosis not present

## 2017-02-25 DIAGNOSIS — D485 Neoplasm of uncertain behavior of skin: Secondary | ICD-10-CM | POA: Diagnosis not present

## 2017-02-25 DIAGNOSIS — B078 Other viral warts: Secondary | ICD-10-CM | POA: Diagnosis not present

## 2017-02-25 DIAGNOSIS — I2510 Atherosclerotic heart disease of native coronary artery without angina pectoris: Secondary | ICD-10-CM | POA: Diagnosis not present

## 2017-02-25 DIAGNOSIS — N183 Chronic kidney disease, stage 3 (moderate): Secondary | ICD-10-CM | POA: Diagnosis not present

## 2017-02-25 DIAGNOSIS — Z0001 Encounter for general adult medical examination with abnormal findings: Secondary | ICD-10-CM | POA: Diagnosis not present

## 2017-02-25 DIAGNOSIS — C4492 Squamous cell carcinoma of skin, unspecified: Secondary | ICD-10-CM

## 2017-02-25 DIAGNOSIS — L578 Other skin changes due to chronic exposure to nonionizing radiation: Secondary | ICD-10-CM | POA: Diagnosis not present

## 2017-02-25 DIAGNOSIS — E785 Hyperlipidemia, unspecified: Secondary | ICD-10-CM | POA: Diagnosis not present

## 2017-02-25 DIAGNOSIS — L570 Actinic keratosis: Secondary | ICD-10-CM | POA: Diagnosis not present

## 2017-02-25 DIAGNOSIS — L820 Inflamed seborrheic keratosis: Secondary | ICD-10-CM | POA: Diagnosis not present

## 2017-02-25 HISTORY — DX: Squamous cell carcinoma of skin, unspecified: C44.92

## 2017-02-26 ENCOUNTER — Ambulatory Visit
Admission: RE | Admit: 2017-02-26 | Discharge: 2017-02-26 | Disposition: A | Discharge status: Home / Self Care | Payer: PPO | Source: Ambulatory Visit | Attending: Internal Medicine | Admitting: Internal Medicine

## 2017-02-26 DIAGNOSIS — Z1231 Encounter for screening mammogram for malignant neoplasm of breast: Secondary | ICD-10-CM | POA: Diagnosis not present

## 2017-02-28 DIAGNOSIS — I480 Paroxysmal atrial fibrillation: Secondary | ICD-10-CM | POA: Diagnosis not present

## 2017-02-28 DIAGNOSIS — M810 Age-related osteoporosis without current pathological fracture: Secondary | ICD-10-CM | POA: Diagnosis not present

## 2017-03-05 DIAGNOSIS — M810 Age-related osteoporosis without current pathological fracture: Secondary | ICD-10-CM | POA: Diagnosis not present

## 2017-03-13 DIAGNOSIS — M810 Age-related osteoporosis without current pathological fracture: Secondary | ICD-10-CM | POA: Diagnosis not present

## 2017-03-18 DIAGNOSIS — E538 Deficiency of other specified B group vitamins: Secondary | ICD-10-CM | POA: Diagnosis not present

## 2017-03-28 DIAGNOSIS — I480 Paroxysmal atrial fibrillation: Secondary | ICD-10-CM | POA: Diagnosis not present

## 2017-04-18 DIAGNOSIS — I480 Paroxysmal atrial fibrillation: Secondary | ICD-10-CM | POA: Diagnosis not present

## 2017-04-22 DIAGNOSIS — L820 Inflamed seborrheic keratosis: Secondary | ICD-10-CM | POA: Diagnosis not present

## 2017-04-22 DIAGNOSIS — Z85828 Personal history of other malignant neoplasm of skin: Secondary | ICD-10-CM | POA: Diagnosis not present

## 2017-04-22 DIAGNOSIS — L570 Actinic keratosis: Secondary | ICD-10-CM | POA: Diagnosis not present

## 2017-04-22 DIAGNOSIS — L578 Other skin changes due to chronic exposure to nonionizing radiation: Secondary | ICD-10-CM | POA: Diagnosis not present

## 2017-04-22 DIAGNOSIS — L821 Other seborrheic keratosis: Secondary | ICD-10-CM | POA: Diagnosis not present

## 2017-04-25 DIAGNOSIS — I480 Paroxysmal atrial fibrillation: Secondary | ICD-10-CM | POA: Diagnosis not present

## 2017-05-20 DIAGNOSIS — E538 Deficiency of other specified B group vitamins: Secondary | ICD-10-CM | POA: Diagnosis not present

## 2017-05-27 DIAGNOSIS — Z9889 Other specified postprocedural states: Secondary | ICD-10-CM | POA: Diagnosis not present

## 2017-05-27 DIAGNOSIS — I340 Nonrheumatic mitral (valve) insufficiency: Secondary | ICD-10-CM | POA: Diagnosis not present

## 2017-05-27 DIAGNOSIS — I2510 Atherosclerotic heart disease of native coronary artery without angina pectoris: Secondary | ICD-10-CM | POA: Diagnosis not present

## 2017-05-27 DIAGNOSIS — I351 Nonrheumatic aortic (valve) insufficiency: Secondary | ICD-10-CM | POA: Diagnosis not present

## 2017-05-27 DIAGNOSIS — E785 Hyperlipidemia, unspecified: Secondary | ICD-10-CM | POA: Diagnosis not present

## 2017-05-27 DIAGNOSIS — R0602 Shortness of breath: Secondary | ICD-10-CM | POA: Diagnosis not present

## 2017-05-27 DIAGNOSIS — R079 Chest pain, unspecified: Secondary | ICD-10-CM | POA: Diagnosis not present

## 2017-05-27 DIAGNOSIS — I361 Nonrheumatic tricuspid (valve) insufficiency: Secondary | ICD-10-CM | POA: Diagnosis not present

## 2017-05-27 DIAGNOSIS — I493 Ventricular premature depolarization: Secondary | ICD-10-CM | POA: Diagnosis not present

## 2017-05-27 DIAGNOSIS — I480 Paroxysmal atrial fibrillation: Secondary | ICD-10-CM | POA: Diagnosis not present

## 2017-05-27 DIAGNOSIS — I341 Nonrheumatic mitral (valve) prolapse: Secondary | ICD-10-CM | POA: Diagnosis not present

## 2017-06-13 DIAGNOSIS — H0016 Chalazion left eye, unspecified eyelid: Secondary | ICD-10-CM | POA: Diagnosis not present

## 2017-06-20 DIAGNOSIS — H0016 Chalazion left eye, unspecified eyelid: Secondary | ICD-10-CM | POA: Diagnosis not present

## 2017-06-24 DIAGNOSIS — I482 Chronic atrial fibrillation: Secondary | ICD-10-CM | POA: Diagnosis not present

## 2017-06-24 DIAGNOSIS — E538 Deficiency of other specified B group vitamins: Secondary | ICD-10-CM | POA: Diagnosis not present

## 2017-07-10 DIAGNOSIS — Z961 Presence of intraocular lens: Secondary | ICD-10-CM | POA: Diagnosis not present

## 2017-07-15 DIAGNOSIS — I480 Paroxysmal atrial fibrillation: Secondary | ICD-10-CM | POA: Diagnosis not present

## 2017-07-25 DIAGNOSIS — E538 Deficiency of other specified B group vitamins: Secondary | ICD-10-CM | POA: Diagnosis not present

## 2017-08-05 DIAGNOSIS — N2581 Secondary hyperparathyroidism of renal origin: Secondary | ICD-10-CM | POA: Diagnosis not present

## 2017-08-05 DIAGNOSIS — N183 Chronic kidney disease, stage 3 (moderate): Secondary | ICD-10-CM | POA: Diagnosis not present

## 2017-08-05 DIAGNOSIS — I129 Hypertensive chronic kidney disease with stage 1 through stage 4 chronic kidney disease, or unspecified chronic kidney disease: Secondary | ICD-10-CM | POA: Diagnosis not present

## 2017-08-19 DIAGNOSIS — I480 Paroxysmal atrial fibrillation: Secondary | ICD-10-CM | POA: Diagnosis not present

## 2017-08-19 DIAGNOSIS — E034 Atrophy of thyroid (acquired): Secondary | ICD-10-CM | POA: Diagnosis not present

## 2017-08-19 DIAGNOSIS — N183 Chronic kidney disease, stage 3 (moderate): Secondary | ICD-10-CM | POA: Diagnosis not present

## 2017-08-26 DIAGNOSIS — I480 Paroxysmal atrial fibrillation: Secondary | ICD-10-CM | POA: Diagnosis not present

## 2017-08-26 DIAGNOSIS — D519 Vitamin B12 deficiency anemia, unspecified: Secondary | ICD-10-CM | POA: Diagnosis not present

## 2017-08-26 DIAGNOSIS — E785 Hyperlipidemia, unspecified: Secondary | ICD-10-CM | POA: Diagnosis not present

## 2017-08-26 DIAGNOSIS — I2510 Atherosclerotic heart disease of native coronary artery without angina pectoris: Secondary | ICD-10-CM | POA: Diagnosis not present

## 2017-08-26 DIAGNOSIS — N183 Chronic kidney disease, stage 3 (moderate): Secondary | ICD-10-CM | POA: Diagnosis not present

## 2017-08-26 DIAGNOSIS — E034 Atrophy of thyroid (acquired): Secondary | ICD-10-CM | POA: Diagnosis not present

## 2017-09-16 DIAGNOSIS — M810 Age-related osteoporosis without current pathological fracture: Secondary | ICD-10-CM | POA: Diagnosis not present

## 2017-09-16 DIAGNOSIS — I480 Paroxysmal atrial fibrillation: Secondary | ICD-10-CM | POA: Diagnosis not present

## 2017-09-30 DIAGNOSIS — E785 Hyperlipidemia, unspecified: Secondary | ICD-10-CM | POA: Diagnosis not present

## 2017-09-30 DIAGNOSIS — I493 Ventricular premature depolarization: Secondary | ICD-10-CM | POA: Diagnosis not present

## 2017-09-30 DIAGNOSIS — Z9889 Other specified postprocedural states: Secondary | ICD-10-CM | POA: Diagnosis not present

## 2017-09-30 DIAGNOSIS — E538 Deficiency of other specified B group vitamins: Secondary | ICD-10-CM | POA: Diagnosis not present

## 2017-09-30 DIAGNOSIS — I351 Nonrheumatic aortic (valve) insufficiency: Secondary | ICD-10-CM | POA: Diagnosis not present

## 2017-09-30 DIAGNOSIS — R079 Chest pain, unspecified: Secondary | ICD-10-CM | POA: Diagnosis not present

## 2017-09-30 DIAGNOSIS — R0602 Shortness of breath: Secondary | ICD-10-CM | POA: Diagnosis not present

## 2017-09-30 DIAGNOSIS — I2510 Atherosclerotic heart disease of native coronary artery without angina pectoris: Secondary | ICD-10-CM | POA: Diagnosis not present

## 2017-09-30 DIAGNOSIS — I361 Nonrheumatic tricuspid (valve) insufficiency: Secondary | ICD-10-CM | POA: Diagnosis not present

## 2017-09-30 DIAGNOSIS — I340 Nonrheumatic mitral (valve) insufficiency: Secondary | ICD-10-CM | POA: Diagnosis not present

## 2017-10-14 DIAGNOSIS — Z23 Encounter for immunization: Secondary | ICD-10-CM | POA: Diagnosis not present

## 2017-10-14 DIAGNOSIS — I480 Paroxysmal atrial fibrillation: Secondary | ICD-10-CM | POA: Diagnosis not present

## 2017-10-21 DIAGNOSIS — D229 Melanocytic nevi, unspecified: Secondary | ICD-10-CM | POA: Diagnosis not present

## 2017-10-21 DIAGNOSIS — L820 Inflamed seborrheic keratosis: Secondary | ICD-10-CM | POA: Diagnosis not present

## 2017-10-21 DIAGNOSIS — L570 Actinic keratosis: Secondary | ICD-10-CM | POA: Diagnosis not present

## 2017-10-21 DIAGNOSIS — L578 Other skin changes due to chronic exposure to nonionizing radiation: Secondary | ICD-10-CM | POA: Diagnosis not present

## 2017-10-21 DIAGNOSIS — Z1283 Encounter for screening for malignant neoplasm of skin: Secondary | ICD-10-CM | POA: Diagnosis not present

## 2017-10-21 DIAGNOSIS — D1800 Hemangioma unspecified site: Secondary | ICD-10-CM | POA: Diagnosis not present

## 2017-10-21 DIAGNOSIS — L821 Other seborrheic keratosis: Secondary | ICD-10-CM | POA: Diagnosis not present

## 2017-10-21 DIAGNOSIS — L812 Freckles: Secondary | ICD-10-CM | POA: Diagnosis not present

## 2017-10-21 DIAGNOSIS — D692 Other nonthrombocytopenic purpura: Secondary | ICD-10-CM | POA: Diagnosis not present

## 2017-10-31 DIAGNOSIS — E538 Deficiency of other specified B group vitamins: Secondary | ICD-10-CM | POA: Diagnosis not present

## 2017-11-11 DIAGNOSIS — I480 Paroxysmal atrial fibrillation: Secondary | ICD-10-CM | POA: Diagnosis not present

## 2017-12-02 DIAGNOSIS — E538 Deficiency of other specified B group vitamins: Secondary | ICD-10-CM | POA: Diagnosis not present

## 2017-12-09 DIAGNOSIS — I480 Paroxysmal atrial fibrillation: Secondary | ICD-10-CM | POA: Diagnosis not present

## 2017-12-26 DIAGNOSIS — L03116 Cellulitis of left lower limb: Secondary | ICD-10-CM | POA: Diagnosis not present

## 2018-01-02 DIAGNOSIS — E538 Deficiency of other specified B group vitamins: Secondary | ICD-10-CM | POA: Diagnosis not present

## 2018-01-14 DIAGNOSIS — L03116 Cellulitis of left lower limb: Secondary | ICD-10-CM | POA: Diagnosis not present

## 2018-01-14 DIAGNOSIS — S81802D Unspecified open wound, left lower leg, subsequent encounter: Secondary | ICD-10-CM | POA: Diagnosis not present

## 2018-01-17 ENCOUNTER — Encounter: Payer: PPO | Attending: Physician Assistant | Admitting: Physician Assistant

## 2018-01-17 DIAGNOSIS — L97822 Non-pressure chronic ulcer of other part of left lower leg with fat layer exposed: Secondary | ICD-10-CM | POA: Diagnosis not present

## 2018-01-17 DIAGNOSIS — I2510 Atherosclerotic heart disease of native coronary artery without angina pectoris: Secondary | ICD-10-CM | POA: Diagnosis not present

## 2018-01-17 DIAGNOSIS — Z7901 Long term (current) use of anticoagulants: Secondary | ICD-10-CM | POA: Diagnosis not present

## 2018-01-17 DIAGNOSIS — I480 Paroxysmal atrial fibrillation: Secondary | ICD-10-CM | POA: Insufficient documentation

## 2018-01-17 DIAGNOSIS — I129 Hypertensive chronic kidney disease with stage 1 through stage 4 chronic kidney disease, or unspecified chronic kidney disease: Secondary | ICD-10-CM | POA: Diagnosis not present

## 2018-01-17 DIAGNOSIS — N183 Chronic kidney disease, stage 3 (moderate): Secondary | ICD-10-CM | POA: Insufficient documentation

## 2018-01-17 DIAGNOSIS — Z992 Dependence on renal dialysis: Secondary | ICD-10-CM | POA: Diagnosis not present

## 2018-01-17 DIAGNOSIS — L97812 Non-pressure chronic ulcer of other part of right lower leg with fat layer exposed: Secondary | ICD-10-CM | POA: Diagnosis not present
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
## 2018-01-20 DIAGNOSIS — S81801A Unspecified open wound, right lower leg, initial encounter: Secondary | ICD-10-CM | POA: Diagnosis not present

## 2018-01-20 NOTE — Progress Notes (Signed)
TYTEANNA, OST ([id number]) Visit Report for 01/17/2018 Chief Complaint Document Details Patient Name: Elizabeth [name], [name] Date of Service: 01/17/2018 8:45 AM Medical Record Number: [id number] Patient Account Number: [id number] Date of Birth/Sex: [birth year] (86 y.o. F) Treating RN: Montey Hora Primary Care Provider: Harrel Lemon Other Clinician: Referring Provider: Harrel Lemon Treating Provider/Extender: Melburn Hake, HOYT Weeks in Treatment: 0 Information Obtained from: Patient Chief Complaint Bilateral LE Ulcers Electronic Signature(s) Signed: 01/20/2018 7:30:10 AM By: Worthy Keeler PA-[name] Entered By: Worthy Keeler on 01/17/2018 09:33:01 Hoerner, Christy [name]. ([id number]) -------------------------------------------------------------------------------- Debridement Details Patient Name: [name] Date of Service: 01/17/2018 8:45 AM Medical Record Number: [id number] Patient Account Number: [id number] Date of Birth/Sex: 03-Sep-1931 (86 y.o. F) Treating RN: Montey Hora Primary Care Provider: Harrel Lemon Other Clinician: Referring Provider: Harrel Lemon Treating Provider/Extender: Melburn Hake, HOYT Weeks in Treatment: 0 Debridement Performed for Wound #1 Right,Anterior Lower Leg Assessment: Performed By: Physician STONE III, HOYT E., PA-[name] Debridement Type: Debridement Level of Consciousness (Pre- Awake and Alert procedure): Pre-procedure Verification/Time Yes - 09:48 Out Taken: Start Time: 09:48 Pain Control: Lidocaine 4% Topical Solution Total Area Debrided (L x W): 2.2 (cm) x 1.5 (cm) = 3.3 (cm) Tissue and other material Viable, Non-Viable, Blood Clots, Slough, Subcutaneous, Skin: Dermis , Skin: Epidermis, debrided: Slough Level: Skin/Subcutaneous Tissue Debridement Description: Excisional Instrument: Curette, Forceps, Scissors Bleeding: Minimum Hemostasis Achieved: Pressure End Time: 09:55 Procedural Pain: 0 Post Procedural Pain: 0 Response to Treatment: Procedure  was tolerated well Level of Consciousness Awake and Alert (Post-procedure): Post Debridement Measurements of Total Wound Length: (cm) 2.2 Width: (cm) 1.5 Depth: (cm) 0.3 Volume: (cm) 0.778 Character of Wound/Ulcer Post Debridement: Improved Post Procedure Diagnosis Same as Pre-procedure Electronic Signature(s) Signed: 01/17/2018 3:29:00 PM By: Montey Hora Signed: 01/20/2018 7:30:10 AM By: Worthy Keeler PA-[name] Entered By: Montey Hora on 01/17/2018 09:55:28 Manna, Toney [name]. ([id number]) -------------------------------------------------------------------------------- Debridement Details Patient Name: [name] Date of Service: 01/17/2018 8:45 AM Medical Record Number: [id number] Patient Account Number: [id number] Date of Birth/Sex: 04-23-1931 (86 y.o. F) Treating RN: Montey Hora Primary Care Provider: Harrel Lemon Other Clinician: Referring Provider: Harrel Lemon Treating Provider/Extender: Melburn Hake, HOYT Weeks in Treatment: 0 Debridement Performed for Wound #2 Left,Anterior Lower Leg Assessment: Performed By: Physician STONE III, HOYT E., PA-[name] Debridement Type: Debridement Level of Consciousness (Pre- Awake and Alert procedure): Pre-procedure Verification/Time Yes - 09:57 Out Taken: Start Time: 09:57 Pain Control: Lidocaine 4% Topical Solution Total Area Debrided (L x W): 1 (cm) x 0.8 (cm) = 0.8 (cm) Tissue and other material Viable, Non-Viable, Blood Clots, Slough, Subcutaneous, Skin: Dermis , Skin: Epidermis, debrided: Slough Level: Skin/Subcutaneous Tissue Debridement Description: Excisional Instrument: Curette Bleeding: Minimum Hemostasis Achieved: Pressure End Time: 10:01 Procedural Pain: 0 Post Procedural Pain: 0 Response to Treatment: Procedure was tolerated well Level of Consciousness Awake and Alert (Post-procedure): Post Debridement Measurements of Total Wound Length: (cm) 1 Width: (cm) 0.8 Depth: (cm) 0.3 Volume: (cm)  0.188 Character of Wound/Ulcer Post Debridement: Improved Post Procedure Diagnosis Same as Pre-procedure Electronic Signature(s) Signed: 01/17/2018 3:29:00 PM By: Montey Hora Signed: 01/20/2018 7:30:10 AM By: Worthy Keeler PA-[name] Entered By: Montey Hora on 01/17/2018 10:01:33 Bennis, Navneet [name]. ([id number]) -------------------------------------------------------------------------------- HPI Details Patient Name: [name] Date of Service: 01/17/2018 8:45 AM Medical Record Number: [id number] Patient Account Number: [id number] Date of Birth/Sex: 01-29-1931 (86 y.o. F) Treating RN: Montey Hora Primary Care Provider: Harrel Lemon Other Clinician: Referring Provider: Harrel Lemon Treating Provider/Extender: Melburn Hake, HOYT Weeks in Treatment: 0 History  of Present Illness HPI Description: 01/17/18 on evaluation today patient presents for evaluation of two ulcers both on the anterior portion of her lower extremities one right and one left. The right she has had for about three weeks the left for about eight weeks and both occurred as a result of her traumatizing her shins on the bottom of the card were trying to get out of the car. She states this is never happen with any other car doors before but she has been having some issues with this sharp area on the store in particular. Both occurred in the similar situation where she was trying not to open the door the whole way in order to avoid damaging anybody else's car beside them. She is been using peroxide initially to clean the wound although she has converted to the wound cleanser now. She has been using if anything just over the counter triple antibiotic ointment and a Band-Aid. With that being said she's not even been using that more recently. The most part she's just been attempting allow this to dry out/scab over. Her APIs were greater than 220 and noncompressible. Patient does have a history of paroxysmal atrial fibrillation for  which she is on Coumadin. She also has chronic kidney disease stage III. She has been on antibiotics that I see no evidence of infection right now which is good news. Electronic Signature(s) Signed: 01/20/2018 7:30:10 AM By: Worthy Keeler PA-[name] Entered By: Worthy Keeler on 01/20/2018 06:55:04 Clabaugh, Sally CMarland Kitchen ([id number]) -------------------------------------------------------------------------------- Physical Exam Details Patient Name: Vicknair, Misheel [name]. Date of Service: 01/17/2018 8:45 AM Medical Record Number: [id number] Patient Account Number: [id number] Date of Birth/Sex: 1931-07-12 (86 y.o. F) Treating RN: Montey Hora Primary Care Provider: Harrel Lemon Other Clinician: Referring Provider: Harrel Lemon Treating Provider/Extender: Melburn Hake, HOYT Weeks in Treatment: 0 Constitutional patient is hypertensive.. pulse regular and within target range for patient.Marland Kitchen respirations regular, non-labored and within target range for patient.Marland Kitchen temperature within target range for patient.. Well-nourished and well-hydrated in no acute distress. Eyes conjunctiva clear no eyelid edema noted. pupils equal round and reactive to light and accommodation. Ears, Nose, Mouth, and Throat no gross abnormality of ear auricles or external auditory canals. normal hearing noted during conversation. mucus membranes moist. Respiratory normal breathing without difficulty. clear to auscultation bilaterally. Cardiovascular regular rate and rhythm with normal S1, S2. 1+ dorsalis pedis/posterior tibialis pulses. no clubbing, cyanosis, significant edema, <3 sec cap refill. Gastrointestinal (GI) soft, non-tender, non-distended, +BS. no ventral hernia noted. Musculoskeletal normal gait and posture. no significant deformity or arthritic changes, no loss or range of motion, no clubbing. Psychiatric this patient is able to make decisions and demonstrates good insight into disease process. Alert and Oriented x 3.  pleasant and cooperative. Notes Upon inspection today patient's wounds did require sharp debridement both locations. She actually tolerated this debridement without complication other than a slight discomfort more on the left as compared to the right. With that being said I was able to clean both wound sufficiently to the point that I believe that we can initiate treatment with Prisma to try to see if this will be of benefit for her as far as allowing new skin to grow more quickly. She did have a deeper wound with some undermining on the right we are gonna pack this area with Prisma for her. Electronic Signature(s) Signed: 01/20/2018 7:30:10 AM By: Worthy Keeler PA-[name] Entered By: Worthy Keeler on 01/20/2018 06:55:58 Brooke, Tanna Savoy ([id number]) -------------------------------------------------------------------------------- Physician Orders Details  Patient Name: Paull, Zakira [name]. Date of Service: 01/17/2018 8:45 AM Medical Record Number: [id number] Patient Account Number: [id number] Date of Birth/Sex: Feb 07, 1931 (86 y.o. F) Treating RN: Montey Hora Primary Care Provider: Harrel Lemon Other Clinician: Referring Provider: Harrel Lemon Treating Provider/Extender: Melburn Hake, HOYT Weeks in Treatment: 0 Verbal / Phone Orders: No Diagnosis Coding ICD-10 Coding Code Description [phone] Non-pressure chronic ulcer of other part of right lower leg with fat layer exposed L97.822 Non-pressure chronic ulcer of other part of left lower leg with fat layer exposed S81.802A Unspecified open wound, left lower leg, initial encounter S81.801A Unspecified open wound, right lower leg, initial encounter I48.0 Paroxysmal atrial fibrillation Z79.01 Long term (current) use of anticoagulants N18.3 Chronic kidney disease, stage 3 (moderate) Wound Cleansing Wound #1 Right,Anterior Lower Leg o Clean wound with Normal Saline. o May Shower, gently pat wound dry prior to applying new dressing. Wound #2  Left,Anterior Lower Leg o Clean wound with Normal Saline. o May Shower, gently pat wound dry prior to applying new dressing. Primary Wound Dressing Wound #1 Right,Anterior Lower Leg o Silver Collagen Wound #2 Left,Anterior Lower Leg o Silver Collagen Secondary Dressing Wound #1 Right,Anterior Lower Leg o Tegaderm o Non-adherent pad Wound #2 Left,Anterior Lower Leg o Tegaderm o Non-adherent pad Dressing Change Frequency Wound #1 Right,Anterior Lower Leg o Change Dressing Monday, Wednesday, Friday Wound #2 Left,Anterior Lower Leg o Change Dressing Monday, Wednesday, Friday Forgione, Cheyla [name]. ([id number]) Follow-up Appointments Wound #1 Right,Anterior Lower Leg o Return Appointment in 1 week. Wound #2 Left,Anterior Lower Leg o Return Appointment in 1 week. Edema Control Wound #1 Right,Anterior Lower Leg o Elevate legs to the level of the heart and pump ankles as often as possible Wound #2 Left,Anterior Lower Leg o Elevate legs to the level of the heart and pump ankles as often as possible Electronic Signature(s) Signed: 01/17/2018 3:29:00 PM By: Montey Hora Signed: 01/20/2018 7:30:10 AM By: Worthy Keeler PA-[name] Entered By: Montey Hora on 01/17/2018 10:02:05 Masella, Kaydon [name]. ([id number]) -------------------------------------------------------------------------------- Problem List Details Patient Name: Humes, Doreatha [name]. Date of Service: 01/17/2018 8:45 AM Medical Record Number: [id number] Patient Account Number: [id number] Date of Birth/Sex: Nov 20, 1931 (86 y.o. F) Treating RN: Montey Hora Primary Care Provider: Harrel Lemon Other Clinician: Referring Provider: Harrel Lemon Treating Provider/Extender: Melburn Hake, HOYT Weeks in Treatment: 0 Active Problems ICD-10 Evaluated Encounter Code Description Active Date Today Diagnosis L97.812 Non-pressure chronic ulcer of other part of right lower leg 01/17/2018 No Yes with fat layer exposed L97.822  Non-pressure chronic ulcer of other part of left lower leg with 01/17/2018 No Yes fat layer exposed S81.802A Unspecified open wound, left lower leg, initial encounter 01/17/2018 No Yes S81.801A Unspecified open wound, right lower leg, initial encounter 01/17/2018 No Yes I48.0 Paroxysmal atrial fibrillation 01/17/2018 No Yes Z79.01 Long term (current) use of anticoagulants 01/17/2018 No Yes N18.3 Chronic kidney disease, stage 3 (moderate) 01/17/2018 No Yes Inactive Problems Resolved Problems Electronic Signature(s) Signed: 01/20/2018 7:30:10 AM By: Worthy Keeler PA-[name] Entered By: Worthy Keeler on 01/17/2018 09:32:48 Gutterman, Mayah [name]. ([id number]) -------------------------------------------------------------------------------- Progress Note Details Patient Name: Regina, Shilee [name]. Date of Service: 01/17/2018 8:45 AM Medical Record Number: [id number] Patient Account Number: [id number] Date of Birth/Sex: 11/17/1931 (86 y.o. F) Treating RN: Montey Hora Primary Care Provider: Harrel Lemon Other Clinician: Referring Provider: Harrel Lemon Treating Provider/Extender: Melburn Hake, HOYT Weeks in Treatment: 0 Subjective Chief Complaint Information obtained from Patient Bilateral LE Ulcers History of Present Illness (HPI) 01/17/18 on evaluation today patient presents for evaluation  of two ulcers both on the anterior portion of her lower extremities one right and one left. The right she has had for about three weeks the left for about eight weeks and both occurred as a result of her traumatizing her shins on the bottom of the card were trying to get out of the car. She states this is never happen with any other car doors before but she has been having some issues with this sharp area on the store in particular. Both occurred in the similar situation where she was trying not to open the door the whole way in order to avoid damaging anybody else's car beside them. She is been using peroxide initially to  clean the wound although she has converted to the wound cleanser now. She has been using if anything just over the counter triple antibiotic ointment and a Band-Aid. With that being said she's not even been using that more recently. The most part she's just been attempting allow this to dry out/scab over. Her APIs were greater than 220 and noncompressible. Patient does have a history of paroxysmal atrial fibrillation for which she is on Coumadin. She also has chronic kidney disease stage III. She has been on antibiotics that I see no evidence of infection right now which is good news. Wound History Patient presents with 2 open wounds that have been present for approximately 8 weeks. Patient has been treating wounds in the following manner: peroxide, neosporin and wound cleanser and bandage.. Laboratory tests have not been performed in the last month. Patient reportedly has not tested positive for an antibiotic resistant organism. Patient reportedly has not tested positive for osteomyelitis. Patient reportedly has not had testing performed to evaluate circulation in the legs. Patient History Information obtained from Patient. Allergies No Known Allergies Family History Cancer - Mother, Diabetes - Siblings, Heart Disease - Father,Siblings, Hypertension - Siblings, Kidney Disease - Siblings, No family history of Hereditary Spherocytosis, Lung Disease, Seizures. Social History Never smoker, Marital Status - Married, Alcohol Use - Never, Drug Use - No History, Caffeine Use - Rarely. Medical History Eyes Denies history of Cataracts, Glaucoma, Optic Neuritis Ear/Nose/Mouth/Throat Denies history of Chronic sinus problems/congestion, Middle ear problems Hematologic/Lymphatic Denies history of Anemia, Hemophilia, Human Immunodeficiency Virus, Lymphedema, Sickle Cell Disease Respiratory Eltringham, Leylanie [name]. ([id number]) Denies history of Aspiration, Asthma, Chronic Obstructive Pulmonary Disease (COPD),  Pneumothorax, Sleep Apnea, Tuberculosis Cardiovascular Patient has history of Congestive Heart Failure, Coronary Artery Disease Denies history of Angina, Arrhythmia, Deep Vein Thrombosis, Hypertension, Hypotension, Myocardial Infarction, Peripheral Arterial Disease, Peripheral Venous Disease, Phlebitis, Vasculitis Gastrointestinal Denies history of Cirrhosis , Colitis, Crohn s, Hepatitis A, Hepatitis B, Hepatitis [name] Endocrine Denies history of Type I Diabetes, Type II Diabetes Genitourinary Patient has history of End Stage Renal Disease Immunological Denies history of Lupus Erythematosus, Raynaud s, Scleroderma Integumentary (Skin) Denies history of History of Burn, History of pressure wounds Musculoskeletal Denies history of Gout, Rheumatoid Arthritis, Osteoarthritis, Osteomyelitis Neurologic Denies history of Dementia, Neuropathy, Quadriplegia, Paraplegia Oncologic Denies history of Received Chemotherapy, Received Radiation Psychiatric Denies history of Anorexia/bulimia, Confinement Anxiety Review of Systems (ROS) Eyes Denies complaints or symptoms of Dry Eyes, Vision Changes, Glasses / Contacts. Ear/Nose/Mouth/Throat Denies complaints or symptoms of Difficult clearing ears, Sinusitis. Hematologic/Lymphatic Denies complaints or symptoms of Bleeding / Clotting Disorders, Human Immunodeficiency Virus. Respiratory Denies complaints or symptoms of Chronic or frequent coughs, Shortness of Breath. Cardiovascular Complains or has symptoms of LE edema - on diuretics. Denies complaints or symptoms of Chest pain. Gastrointestinal Denies  complaints or symptoms of Frequent diarrhea, Nausea, Vomiting. Endocrine Complains or has symptoms of Thyroid disease. Denies complaints or symptoms of Hepatitis. Genitourinary Complains or has symptoms of Kidney failure/ Dialysis. Denies complaints or symptoms of Incontinence/dribbling. Immunological Denies complaints or symptoms of Hives,  Itching. Integumentary (Skin) Complains or has symptoms of Bleeding or bruising tendency - blood thinners. Denies complaints or symptoms of Wounds, Breakdown, Swelling. Musculoskeletal Complains or has symptoms of Muscle Weakness. Denies complaints or symptoms of Muscle Pain. Neurologic Denies complaints or symptoms of Numbness/parasthesias, Focal/Weakness. Psychiatric Denies complaints or symptoms of Anxiety, Claustrophobia. Mcgonagle, Icela [name]. ([id number]) Objective Constitutional patient is hypertensive.. pulse regular and within target range for patient.Marland Kitchen respirations regular, non-labored and within target range for patient.Marland Kitchen temperature within target range for patient.. Well-nourished and well-hydrated in no acute distress. Vitals Time Taken: 8:43 AM, Height: 61 in, Source: Stated, Weight: 100 lbs, Source: Stated, BMI: 18.9, Temperature: 97.7 F, Pulse: 72 bpm, Respiratory Rate: 16 breaths/min, Blood Pressure: 141/84 mmHg. Eyes conjunctiva clear no eyelid edema noted. pupils equal round and reactive to light and accommodation. Ears, Nose, Mouth, and Throat no gross abnormality of ear auricles or external auditory canals. normal hearing noted during conversation. mucus membranes moist. Respiratory normal breathing without difficulty. clear to auscultation bilaterally. Cardiovascular regular rate and rhythm with normal S1, S2. 1+ dorsalis pedis/posterior tibialis pulses. no clubbing, cyanosis, significant edema, Gastrointestinal (GI) soft, non-tender, non-distended, +BS. no ventral hernia noted. Musculoskeletal normal gait and posture. no significant deformity or arthritic changes, no loss or range of motion, no clubbing. Psychiatric this patient is able to make decisions and demonstrates good insight into disease process. Alert and Oriented x 3. pleasant and cooperative. General Notes: Upon inspection today patient's wounds did require sharp debridement both locations. She actually  tolerated this debridement without complication other than a slight discomfort more on the left as compared to the right. With that being said I was able to clean both wound sufficiently to the point that I believe that we can initiate treatment with Prisma to try to see if this will be of benefit for her as far as allowing new skin to grow more quickly. She did have a deeper wound with some undermining on the right we are gonna pack this area with Prisma for her. Integumentary (Hair, Skin) Wound #1 status is Open. Original cause of wound was Trauma. The wound is located on the Right,Anterior Lower Leg. The wound measures 2.2cm length x 1.5cm width x 0.1cm depth; 2.592cm^2 area and 0.259cm^3 volume. There is Fat Layer (Subcutaneous Tissue) Exposed exposed. There is no tunneling or undermining noted. There is a medium amount of serosanguineous drainage noted. The wound margin is flat and intact. There is medium (34-66%) red granulation within the wound bed. There is a medium (34-66%) amount of necrotic tissue within the wound bed including Adherent Slough. The periwound skin appearance exhibited: Hemosiderin Staining. The periwound skin appearance did not exhibit: Callus, Crepitus, Excoriation, Induration, Rash, Scarring, Dry/Scaly, Maceration, Atrophie Blanche, Cyanosis, Ecchymosis, Mottled, Pallor, Rubor, Erythema. The periwound has tenderness on palpation. Wound #2 status is Open. Original cause of wound was Trauma. The wound is located on the Left,Anterior Lower Leg. The wound measures 1cm length x 0.8cm width x 0.1cm depth; 0.628cm^2 area and 0.063cm^3 volume. There is Fat Layer (Subcutaneous Tissue) Exposed exposed. There is no tunneling or undermining noted. There is a medium amount of Mcglasson, Francheska [name]. ([id number]) serosanguineous drainage noted. The wound margin is flat and intact. There is medium (34-66%) red  granulation within the wound bed. There is a medium (34-66%) amount of necrotic  tissue within the wound bed including Adherent Slough. The periwound skin appearance exhibited: Hemosiderin Staining. The periwound skin appearance did not exhibit: Callus, Crepitus, Excoriation, Induration, Rash, Scarring, Dry/Scaly, Maceration, Atrophie Blanche, Cyanosis, Ecchymosis, Mottled, Pallor, Rubor, Erythema. The periwound has tenderness on palpation. 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 There was a Excisional Skin/Subcutaneous Tissue Debridement with a total area of 3.3 sq cm performed by STONE III, HOYT E., PA-[name]. With the following instrument(s): Curette, Forceps, and Scissors to remove Viable and Non-Viable tissue/material. Material removed includes Blood Clots, Subcutaneous Tissue, Slough, Skin: Dermis, and Skin: Epidermis after achieving pain control using Lidocaine 4% Topical Solution. No specimens were taken. A time out was conducted at 09:48, prior to the start of the procedure. A Minimum amount of bleeding was controlled with Pressure. The procedure was tolerated well with a pain level of 0 throughout and a pain level of 0 following the procedure. Post Debridement Measurements: 2.2cm length x 1.5cm width x 0.3cm depth; 0.778cm^3 volume. Character of Wound/Ulcer Post Debridement is improved. Post procedure Diagnosis Wound #1: Same as Pre-Procedure Wound #2 Pre-procedure diagnosis of Wound #2 is a Trauma, Other located on the Left,Anterior Lower Leg . There was a  Excisional Skin/Subcutaneous Tissue Debridement with a total area of 0.8 sq cm performed by STONE III, HOYT E., PA-[name]. With the following instrument(s): Curette to remove Viable and Non-Viable tissue/material. Material removed includes Blood Clots, Subcutaneous Tissue, Slough, Skin: Dermis, and Skin: Epidermis after achieving pain control using Lidocaine 4% Topical Solution. No specimens were taken. A time out was conducted at 09:57, prior to the start of the procedure. A Minimum amount of bleeding was controlled with Pressure. The procedure was tolerated well with a pain level of 0 throughout and a pain level of 0 following the procedure. Post Debridement Measurements: 1cm length x 0.8cm width x 0.3cm depth; 0.188cm^3 volume. Character of Wound/Ulcer Post Debridement is improved. Post procedure Diagnosis Wound #2: Same as Pre-Procedure Klarich, Kellan [name]. ([id number]) Plan Wound Cleansing: Wound #1 Right,Anterior Lower Leg: Clean wound with Normal Saline. May Shower, gently pat wound dry prior to applying new dressing. Wound #2 Left,Anterior Lower Leg: Clean wound with Normal Saline. May Shower, gently pat wound dry prior to applying new dressing. Primary Wound Dressing: Wound #1 Right,Anterior Lower Leg: Silver Collagen Wound #2 Left,Anterior Lower Leg: Silver Collagen Secondary Dressing: Wound #1 Right,Anterior Lower Leg: Tegaderm Non-adherent pad Wound #2 Left,Anterior Lower Leg: Tegaderm Non-adherent pad Dressing Change Frequency: Wound #1 Right,Anterior Lower Leg: Change Dressing Monday, Wednesday, Friday Wound #2 Left,Anterior Lower Leg: Change Dressing Monday, Wednesday, Friday Follow-up Appointments: Wound #1 Right,Anterior Lower Leg: Return Appointment in 1 week. Wound #2 Left,Anterior Lower Leg: Return Appointment in 1 week. Edema Control: Wound #1 Right,Anterior Lower Leg: Elevate legs to the level of the heart and pump ankles as often as possible Wound #2  Left,Anterior Lower Leg: Elevate legs to the level of the heart and pump ankles as often as possible Upon inspection today patient's  wounds again post debridement appear to be much better I think Prisma will be a definitely appropriate treatment plan for her. She's in a beautiful plan. Subsequently we will see were things stand at follow-up. If anything changes she will contact the office and let me know. Please see above for specific wound care orders. We will see patient for re-evaluation in 1 week(s) here in the clinic. If anything worsens or changes patient will contact our office for additional recommendations. Electronic Signature(s) Signed: 01/20/2018 7:30:10 AM By: Worthy Keeler PA-[name] Entered By: Worthy Keeler on 01/20/2018 06:56:30 Crisanti, Darby CMarland Kitchen ([id number]) -------------------------------------------------------------------------------- ROS/PFSH Details Patient Name: Schecter, Shelbie [name]. Date of Service: 01/17/2018 8:45 AM Medical Record Number: [id number] Patient Account Number: [id number] Date of Birth/Sex: May 16, 1931 (86 y.o. F) Treating RN: Secundino Ginger Primary Care Provider: Harrel Lemon Other Clinician: Referring Provider: Harrel Lemon Treating Provider/Extender: Melburn Hake, HOYT Weeks in Treatment: 0 Information Obtained From Patient Wound History Do you currently have one or more open woundso Yes How many open wounds do you currently haveo 2 Approximately how long have you had your woundso 8 weeks How have you been treating your wound(s) until nowo peroxide, neosporin and wound cleanser and bandage. Has your wound(s) ever healed and then re-openedo No Have you had any lab work done in the past montho No Have you tested positive for an antibiotic resistant organism No (MRSA, VRE)o Have you tested positive for osteomyelitis (bone infection)o No Have you had any tests for circulation on your legso No Eyes Complaints and Symptoms: Negative for: Dry Eyes; Vision Changes;  Glasses / Contacts Medical History: Negative for: Cataracts; Glaucoma; Optic Neuritis Ear/Nose/Mouth/Throat Complaints and Symptoms: Negative for: Difficult clearing ears; Sinusitis Medical History: Negative for: Chronic sinus problems/congestion; Middle ear problems Hematologic/Lymphatic Complaints and Symptoms: Negative for: Bleeding / Clotting Disorders; Human Immunodeficiency Virus Medical History: Negative for: Anemia; Hemophilia; Human Immunodeficiency Virus; Lymphedema; Sickle Cell Disease Respiratory Complaints and Symptoms: Negative for: Chronic or frequent coughs; Shortness of Breath Medical History: Negative for: Aspiration; Asthma; Chronic Obstructive Pulmonary Disease (COPD); Pneumothorax; Sleep Apnea; Tuberculosis Kromer, Rashel [name]. ([id number]) Cardiovascular Complaints and Symptoms: Positive for: LE edema - on diuretics Negative for: Chest pain Medical History: Positive for: Congestive Heart Failure; Coronary Artery Disease Negative for: Angina; Arrhythmia; Deep Vein Thrombosis; Hypertension; Hypotension; Myocardial Infarction; Peripheral Arterial Disease; Peripheral Venous Disease; Phlebitis; Vasculitis Gastrointestinal Complaints and Symptoms: Negative for: Frequent diarrhea; Nausea; Vomiting Medical History: Negative for: Cirrhosis ; Colitis; Crohnos; Hepatitis A; Hepatitis B; Hepatitis [name] Endocrine Complaints and Symptoms: Positive for: Thyroid disease Negative for: Hepatitis Medical History: Negative for: Type I Diabetes; Type II Diabetes Genitourinary Complaints and Symptoms: Positive for: Kidney failure/ Dialysis Negative for: Incontinence/dribbling Medical History: Positive for: End Stage Renal Disease Immunological Complaints and Symptoms: Negative for: Hives; Itching Medical History: Negative for: Lupus Erythematosus; Raynaudos; Scleroderma Integumentary (Skin) Complaints and Symptoms: Positive for: Bleeding or bruising tendency - blood  thinners Negative for: Wounds; Breakdown; Swelling Medical History: Negative for: History of Burn; History of pressure wounds Musculoskeletal Complaints and Symptoms: Positive for: Muscle Weakness Negative for: Muscle Pain Medical History: [name], Joye [name]. ([id number]) Negative for: Gout; Rheumatoid Arthritis; Osteoarthritis; Osteomyelitis Neurologic Complaints and Symptoms: Negative for: Numbness/parasthesias; Focal/Weakness Medical History: Negative for: Dementia; Neuropathy; Quadriplegia; Paraplegia Psychiatric Complaints and Symptoms: Negative for: Anxiety; Claustrophobia Medical History: Negative for: Anorexia/bulimia; Confinement Anxiety Oncologic Medical History: Negative for: Received Chemotherapy; Received Radiation Immunizations Pneumococcal Vaccine: Received Pneumococcal Vaccination: Yes Implantable Devices Family and Social History Cancer: Yes - Mother; Diabetes:  Yes - Siblings; Heart Disease: Yes - Father,Siblings; Hereditary Spherocytosis: No; Hypertension: Yes - Siblings; Kidney Disease: Yes - Siblings; Lung Disease: No; Seizures: No; Never smoker; Marital Status - Married; Alcohol Use: Never; Drug Use: No History; Caffeine Use: Rarely; Financial Concerns: No; Food, Clothing or Shelter Needs: No; Support System Lacking: No; Transportation Concerns: No; Advanced Directives: Yes (Not Provided); Do not resuscitate: Yes (Not Provided); Living Will: Yes (Not Provided); Medical Power of Attorney: Yes (Not Provided) Electronic Signature(s) Signed: 01/17/2018 3:39:37 PM By: Secundino Ginger Signed: 01/20/2018 7:30:10 AM By: Worthy Keeler PA-[name] Entered By: Secundino Ginger on 01/17/2018 09:05:31 Haberland, Kingston [name]. ([id number]) -------------------------------------------------------------------------------- [facility] Details Patient Name: Charbonnet, Yoshi [name]. Date of Service: 01/17/2018 Medical Record Number: [id number] Patient Account Number: [id number] Date of Birth/Sex: May 10, 1931 (85 y.o.  F) Treating RN: Montey Hora Primary Care Provider: Harrel Lemon Other Clinician: Referring Provider: Harrel Lemon Treating Provider/Extender: Melburn Hake, HOYT Weeks in Treatment: 0 Diagnosis Coding ICD-10 Codes Code Description [phone] Non-pressure chronic ulcer of other part of right lower leg with fat layer exposed L97.822 Non-pressure chronic ulcer of other part of left lower leg with fat layer exposed S81.802A Unspecified open wound, left lower leg, initial encounter S81.801A Unspecified open wound, right lower leg, initial encounter I48.0 Paroxysmal atrial fibrillation Z79.01 Long term (current) use of anticoagulants N18.3 Chronic kidney disease, stage 3 (moderate) Facility Procedures CPT4 Code Description: [id number] - WOUND CARE VISIT-LEV 3 EST PT Modifier: Quantity: 1 CPT4 Code Description: [id number] [place] - DEB SUBQ TISSUE 20 SQ CM/< ICD-10 Diagnosis Description Q76.226 Non-pressure chronic ulcer of other part of right lower leg wit L97.822 Non-pressure chronic ulcer of other part of left lower leg with Modifier: h fat layer expo fat layer expos Quantity: 1 sed ed Physician Procedures CPT4 Code Description: [id number] WC PHYS LEVEL 3 o NEW PT ICD-10 Diagnosis Description Y56.389 Non-pressure chronic ulcer of other part of right lower leg wi L97.822 Non-pressure chronic ulcer of other part of left lower leg wit S81.802A Unspecified open  wound, left lower leg, initial encounter S81.801A Unspecified open wound, right lower leg, initial encounter Modifier: 25 th fat layer expo h fat layer expos Quantity: 1 sed ed CPT4 Code Description: [id number] 11042 - WC PHYS SUBQ TISS 20 SQ CM ICD-10 Diagnosis Description G81.157 Non-pressure chronic ulcer of other part of right lower leg wi L97.822 Non-pressure chronic ulcer of other part of left lower leg wit Modifier: th fat layer expo h fat layer expos Quantity: 1 sed ed Electronic Signature(s) Signed: 01/20/2018 7:30:10 AM By: Worthy Keeler PA-[name] Entered By: Worthy Keeler on 01/20/2018 06:56:59

## 2018-01-20 NOTE — Progress Notes (Signed)
ADISYNN, SULEIMAN ([id number]) Visit Report for 01/17/2018 Allergy List Details Patient Name: [name], [name] Date of Service: 01/17/2018 8:45 AM Medical Record Number: [id number] Patient Account Number: [id number] Date of Birth/Sex: 1931/12/02 (86 y.o. F) Treating RN: Elizabeth [name] Primary Care Elizabeth [name]: Elizabeth [name] Other Clinician: Referring Elizabeth [name]: Elizabeth [name] Treating Elizabeth [name]/Extender: Elizabeth [name], Elizabeth [name] Weeks in Treatment: 0 Allergies Active Allergies No Known Allergies Allergy Notes Electronic Signature(s) Signed: 01/17/2018 3:39:37 PM By: Elizabeth [name] Entered By: Elizabeth [name] on 01/17/2018 08:52:47 Naser, Eldine C. ([id number]) -------------------------------------------------------------------------------- Arrival Information Details Patient Name: [name], [name] Date of Service: 01/17/2018 8:45 AM Medical Record Number: [id number] Patient Account Number: [id number] Date of Birth/Sex: 07-08-1931 (86 y.o. F) Treating RN: Elizabeth [name] Primary Care Elizabeth [name]: Elizabeth [name] Other Clinician: Referring Elizabeth [name]: Elizabeth [name] Treating Elizabeth [name]/Extender: Melburn Hake, Elizabeth [name] Weeks in Treatment: 0 Visit Information Patient Arrived: Ambulatory Arrival Time: 08:42 Accompanied By: self Transfer Assistance: None Patient Identification Verified: Yes Secondary Verification Process Completed: Yes Electronic Signature(s) Signed: 01/17/2018 3:31:07 PM By: Elizabeth [name] Entered By: Elizabeth [name] on 01/17/2018 08:42:48 Adinolfi, Sammie C. ([id number]) -------------------------------------------------------------------------------- Clinic Level of Care Assessment Details Patient Name: [name], [name] Date of Service: 01/17/2018 8:45 AM Medical Record Number: [id number] Patient Account Number: [id number] Date of Birth/Sex: 03-Mar-1931 (86 y.o. F) Treating RN: Elizabeth [name] Primary Care Terriah Reggio: Elizabeth [name] Other Clinician: Referring Elizabeth [name]: Elizabeth [name] Treating Vesper Trant/Extender: Melburn Hake, Elizabeth [name] Weeks in Treatment: 0 Clinic Level of Care Assessment Items TOOL 1 Quantity Score []  - Use when EandM and Procedure is performed on INITIAL visit 0 ASSESSMENTS - Nursing Assessment / Reassessment X - General Physical Exam (combine w/ comprehensive assessment (listed just below) when 1 20 performed on new pt. evals) X- 1 25 Comprehensive Assessment (HX, ROS, Risk Assessments, Wounds Hx, etc.) ASSESSMENTS - Wound and Skin Assessment / Reassessment []  - Dermatologic / Skin Assessment (not related to wound area) 0 ASSESSMENTS - Ostomy and/or Continence Assessment and Care []  - Incontinence Assessment and Management 0 []  - 0 Ostomy Care Assessment and Management (repouching, etc.) PROCESS - Coordination of Care X - Simple Patient / Family Education for ongoing care 1 15 []  - 0 Complex (extensive) Patient / Family Education for ongoing care X- 1 10 Staff obtains Programmer, systems, Records, Test Results / Process Orders []  - 0 Staff telephones HHA, Nursing Homes / Clarify orders / etc []  - 0 Routine Transfer to another Facility (non-emergent condition) []  - 0 Routine Hospital Admission (non-emergent condition) X- 1 15 New Admissions / Biomedical engineer / Ordering NPWT, Apligraf, etc. []  - 0 Emergency Hospital Admission (emergent condition) PROCESS - Special Needs []  - Pediatric / Minor Patient Management 0 []  - 0 Isolation Patient Management []  - 0 Hearing / Language / Visual special needs []  - 0 Assessment of Community assistance (transportation, D/C planning, etc.) []  - 0 Additional assistance / Altered mentation []  - 0 Support Surface(s) Assessment (bed, cushion, seat, etc.) Mcgonagle, Deicy C. ([id number]) INTERVENTIONS - Miscellaneous []  - External ear exam 0 []  - 0 Patient Transfer (multiple staff / Civil Service fast streamer / Similar devices) []  - 0 Simple Staple / Suture removal (25 or less) []  - 0 Complex Staple / Suture removal (26 or  more) []  - 0 Hypo/Hyperglycemic Management (do not check if billed separately) X- 1 15 Ankle / Brachial Index (ABI) - do not check if billed separately Has the patient been seen at the hospital within the last three years: Yes Total Score: 100 Level Of Care:  New/Established - Level 3 Electronic Signature(s) Signed: 01/17/2018 3:29:00 PM By: Elizabeth [name] Entered By: Elizabeth [name] on 01/17/2018 09:49:21 [name], [name] ([id number]) -------------------------------------------------------------------------------- Encounter Discharge Information Details Patient Name: [name], [name] Date of Service: 01/17/2018 8:45 AM Medical Record Number: [id number] Patient Account Number: [id number] Date of Birth/Sex: Apr 06, 1931 (86 y.o. F) Treating RN: Elizabeth [name] Primary Care Darlean Warmoth: Elizabeth [name] Other Clinician: Referring Elizabeth [name]: Elizabeth [name] Treating Elizabeth [name]/Extender: Melburn Hake, Elizabeth [name] Weeks in Treatment: 0 Encounter Discharge Information Items Post Procedure Vitals Discharge Condition: Stable Temperature (F): 97.7 Ambulatory Status: Ambulatory Pulse (bpm): 72 Discharge Destination: Home Respiratory Rate (breaths/min): 16 Transportation: Private Auto Blood Pressure (mmHg): 141/84 Accompanied By: self Schedule Follow-up Appointment: Yes Clinical Summary of Care: Electronic Signature(s) Signed: 01/17/2018 3:29:00 PM By: Elizabeth [name] Entered By: Elizabeth [name] on 01/17/2018 10:03:07 [name], [name] ([id number]) -------------------------------------------------------------------------------- Lower Extremity Assessment Details Patient Name: [name], [name] Date of Service: 01/17/2018 8:45 AM Medical Record Number: [id number] Patient Account Number: [id number] Date of Birth/Sex: 12/20/1931 (86 y.o. F) Treating RN: Elizabeth [name] Primary Care Elizabeth [name]: Elizabeth [name] Other Clinician: Referring Laquentin Loudermilk: Elizabeth [name] Treating Jayton Popelka/Extender: Melburn Hake, Elizabeth [name] Weeks in Treatment: 0 Edema  Assessment Assessed: [Left: No] [Right: No] Edema: [Left: No] [Right: No] Calf Left: Right: Point of Measurement: 26 cm From Medial Instep 29.5 cm 29 cm Ankle Left: Right: Point of Measurement: 11 cm From Medial Instep 19.5 cm 19 cm Vascular Assessment Claudication: Claudication Assessment [Left:None] [Right:None] Pulses: Dorsalis Pedis Palpable: [Left:Yes] [Right:Yes] Posterior Tibial Extremity colors, hair growth, and conditions: Extremity Color: [Left:Hyperpigmented] [Right:Hyperpigmented] Hair Growth on Extremity: [Left:No] [Right:No] Temperature of Extremity: [Left:Warm] [Right:Warm] Capillary Refill: [Left:< 3 seconds] [Right:< 3 seconds] Blood Pressure: Brachial: [Right:140] Toe Nail Assessment Left: Right: Thick: No No Discolored: No No Deformed: No No Improper Length and Hygiene: No No Electronic Signature(s) Signed: 01/17/2018 3:39:37 PM By: Elizabeth [name] Entered By: Elizabeth [name] on 01/17/2018 09:24:50 Carrera, Amarilis C. ([id number]) -------------------------------------------------------------------------------- Multi Wound Chart Details Patient Name: [name], [name] Date of Service: 01/17/2018 8:45 AM Medical Record Number: [id number] Patient Account Number: [id number] Date of Birth/Sex: 06/14/1931 (86 y.o. F) Treating RN: Elizabeth [name] Primary Care Kristofer Schaffert: Elizabeth [name] Other Clinician: Referring Loribeth Katich: Elizabeth [name] Treating Caylen Yardley/Extender: Melburn Hake, Elizabeth [name] Weeks in Treatment: 0 Vital Signs Height(in): 61 Pulse(bpm): 72 Weight(lbs): 100 Blood Pressure(mmHg): 141/84 Body Mass Index(BMI): 19 Temperature(F): 97.7 Respiratory Rate 16 (breaths/min): Photos: [N/A:N/A] Wound Location: Right Lower Leg - Anterior Left Lower Leg - Anterior N/A Wounding Event: Trauma Trauma N/A Primary Etiology: Trauma, Other Trauma, Other N/A Comorbid History: Congestive Heart Failure, Congestive Heart Failure, N/A Coronary Artery Disease, End Coronary Artery Disease,  End Stage Renal Disease Stage Renal Disease Date Acquired: 12/26/2017 11/17/2017 N/A Weeks of Treatment: 0 0 N/A Wound Status: Open Open N/A Measurements L x W x D 2.2x1.5x0.1 1x0.8x0.1 N/A (cm) Area (cm) : 2.592 0.628 N/A Volume (cm) : 0.259 0.063 N/A % Reduction in Area: 0.00% N/A N/A % Reduction in Volume: 0.00% N/A N/A Classification: Full Thickness Without Full Thickness Without N/A Exposed Support Structures Exposed Support Structures Exudate Amount: Small Small N/A Exudate Type: Serosanguineous Serosanguineous N/A Exudate Color: red, brown red, brown N/A Granulation Amount: None Present (0%) None Present (0%) N/A Necrotic Amount: Large (67-100%) Small (1-33%) N/A Necrotic Tissue: Eschar Eschar N/A Exposed Structures: Fat Layer (Subcutaneous Fat Layer (Subcutaneous N/A Tissue) Exposed: Yes Tissue) Exposed: Yes Fascia: No Fascia: No Tendon: No Tendon: No Muscle: No Muscle: No Spikes, Jesenya C. ([id number]) Joint: No Joint: No Bone: No Bone: No Periwound  Skin Texture: Excoriation: No Excoriation: No N/A Induration: No Induration: No Callus: No Callus: No Crepitus: No Crepitus: No Rash: No Rash: No Scarring: No Scarring: No Periwound Skin Moisture: Maceration: No Maceration: No N/A Dry/Scaly: No Dry/Scaly: No Periwound Skin Color: Hemosiderin Staining: Yes Hemosiderin Staining: Yes N/A Atrophie Blanche: No Atrophie Blanche: No Cyanosis: No Cyanosis: No Ecchymosis: No Ecchymosis: No Erythema: No Erythema: No Mottled: No Mottled: No Pallor: No Pallor: No Rubor: No Rubor: No Tenderness on Palpation: Yes Yes N/A Wound Preparation: Ulcer Cleansing: Ulcer Cleansing: N/A Rinsed/Irrigated with Saline Rinsed/Irrigated with Saline Topical Anesthetic Applied: Topical Anesthetic Applied: Other: lidocaine 4% Other: lidocaine 4% Treatment Notes Electronic Signature(s) Signed: 01/17/2018 3:29:00 PM By: Elizabeth [name] Entered By: Elizabeth [name] on  01/17/2018 09:45:46 [name], [name] ([id number]) -------------------------------------------------------------------------------- [place] Details Patient Name: [name], [name] Date of Service: 01/17/2018 8:45 AM Medical Record Number: [id number] Patient Account Number: [id number] Date of Birth/Sex: September 25, 1931 (86 y.o. F) Treating RN: Elizabeth [name] Primary Care Ardel Jagger: Elizabeth [name] Other Clinician: Referring Adaysha Dubinsky: Elizabeth [name] Treating Argyle Gustafson/Extender: Melburn Hake, Elizabeth [name] Weeks in Treatment: 0 Active Inactive Abuse / Safety / Falls / Self Care Management Nursing Diagnoses: Potential for falls Goals: Patient will not experience any injury related to falls Date Initiated: 01/17/2018 Target Resolution Date: 04/12/2018 Goal Status: Active Interventions: Assess fall risk on admission and as needed Notes: Orientation to the Wound Care Program Nursing Diagnoses: Knowledge deficit related to the wound healing center program Goals: Patient/caregiver will verbalize understanding of the [facility] Program Date Initiated: 01/17/2018 Target Resolution Date: 04/12/2018 Goal Status: Active Interventions: Provide education on orientation to the wound center Notes: Wound/Skin Impairment Nursing Diagnoses: Impaired tissue integrity Goals: Ulcer/skin breakdown will heal within 14 weeks Date Initiated: 01/17/2018 Target Resolution Date: 04/12/2018 Goal Status: Active Interventions: Assess patient/caregiver ability to obtain necessary supplies [name], [name] ([id number]) Assess patient/caregiver ability to perform ulcer/skin care regimen upon admission and as needed Assess ulceration(s) every visit Notes: Electronic Signature(s) Signed: 01/17/2018 3:29:00 PM By: Elizabeth [name] Entered By: Elizabeth [name] on 01/17/2018 09:45:33 Natividad, Nataliya C. ([id number]) -------------------------------------------------------------------------------- Pain Assessment  Details Patient Name: [name], [name] Date of Service: 01/17/2018 8:45 AM Medical Record Number: [id number] Patient Account Number: [id number] Date of Birth/Sex: November 16, 1931 (86 y.o. F) Treating RN: Elizabeth [name] Primary Care Diera Wirkkala: Elizabeth [name] Other Clinician: Referring Veer Elamin: Elizabeth [name] Treating Gualberto Wahlen/Extender: Melburn Hake, Elizabeth [name] Weeks in Treatment: 0 Active Problems Location of Pain Severity and Description of Pain Patient Has Paino Yes Site Locations Rate the pain. Current Pain Level: 6 Pain Management and Medication Current Pain Management: Electronic Signature(s) Signed: 01/17/2018 3:29:00 PM By: Elizabeth [name] Signed: 01/17/2018 3:31:07 PM By: Elizabeth [name] Entered By: Elizabeth [name] on 01/17/2018 08:43:32 Harbuck, Chealsey C. ([id number]) -------------------------------------------------------------------------------- Patient/Caregiver Education Details Patient Name: [name], [name] Date of Service: 01/17/2018 8:45 AM Medical Record Number: [id number] Patient Account Number: [id number] Date of Birth/Gender: 04-17-1931 (85 y.o. F) Treating RN: Elizabeth [name] Primary Care Physician: Elizabeth [name] Other Clinician: Referring Physician: Harrel [name] Treating Physician/Extender: Sharalyn Ink in Treatment: 0 Education Assessment Education Provided To: Patient Education Topics Provided Wound Debridement: Handouts: Other: purpose of debridement Methods: Explain/Verbal Responses: State content correctly Wound/Skin Impairment: Handouts: Other: wound care as ordered Methods: Demonstration, Explain/Verbal Responses: State content correctly Electronic Signature(s) Signed: 01/17/2018 3:29:00 PM By: Elizabeth [name] Entered By: Elizabeth [name] on 01/17/2018 09:50:28 Chaney, Maanasa C. ([id number]) -------------------------------------------------------------------------------- Wound Assessment Details Patient Name: [name], Elizabeth  C. Date of Service: 01/17/2018 8:45  AM Medical Record Number: [id number] Patient Account Number: [id number] Date of Birth/Sex: 1931/06/30 (86 y.o. F) Treating RN: Elizabeth [name] Primary Care Tajah Noguchi: Elizabeth [name] Other Clinician: Referring Felesha Moncrieffe: Elizabeth [name] Treating Aylyn Wenzler/Extender: Melburn Hake, Elizabeth [name] Weeks in Treatment: 0 Wound Status Wound Number: 1 Primary Trauma, Other Etiology: Wound Location: Right Lower Leg - Anterior Wound Open Wounding Event: Trauma Status: Date Acquired: 12/26/2017 Comorbid Congestive Heart Failure, Coronary Artery Weeks Of Treatment: 0 History: Disease, End Stage Renal Disease Clustered Wound: No Photos Wound Measurements Length: (cm) 2.2 Width: (cm) 1.5 Depth: (cm) 0.1 Area: (cm) 2.592 Volume: (cm) 0.259 % Reduction in Area: 0% % Reduction in Volume: 0% Tunneling: No Undermining: No Wound Description Full Thickness Without Exposed Support Classification: Structures Wound Margin: Flat and Intact Exudate Medium Amount: Exudate Type: Serosanguineous Exudate Color: red, brown Foul Odor After Cleansing: No Slough/Fibrino No Wound Bed Granulation Amount: Medium (34-66%) Exposed Structure Granulation Quality: Red Fascia Exposed: No Necrotic Amount: Medium (34-66%) Fat Layer (Subcutaneous Tissue) Exposed: Yes Necrotic Quality: Adherent Slough Tendon Exposed: No Muscle Exposed: No Joint Exposed: No Bone Exposed: No Periwound Skin Texture Chesnut, Sheriece C. ([id number]) Texture Color No Abnormalities Noted: No No Abnormalities Noted: No Callus: No Atrophie Blanche: No Crepitus: No Cyanosis: No Excoriation: No Ecchymosis: No Induration: No Erythema: No Rash: No Hemosiderin Staining: Yes Scarring: No Mottled: No Pallor: No Moisture Rubor: No No Abnormalities Noted: No Dry / Scaly: No Temperature / Pain Maceration: No Tenderness on Palpation: Yes Wound Preparation Ulcer Cleansing: Rinsed/Irrigated with Saline Topical  Anesthetic Applied: Other: lidocaine 4%, Treatment Notes Wound #1 (Right, Anterior Lower Leg) Notes prisma, non adherent pad and tegaderm Electronic Signature(s) Signed: 01/17/2018 2:22:44 PM By: Elizabeth [name] Signed: 01/17/2018 3:39:37 PM By: Elizabeth [name] Entered By: Elizabeth [name] on 01/17/2018 14:22:43 Makarewicz, Shameria C. ([id number]) -------------------------------------------------------------------------------- Wound Assessment Details Patient Name: [name], [name] Date of Service: 01/17/2018 8:45 AM Medical Record Number: [id number] Patient Account Number: [id number] Date of Birth/Sex: [birth year] (86 y.o. F) Treating RN: Elizabeth [name] Primary Care Maloni Musleh: Elizabeth [name] Other Clinician: Referring Leahna Hewson: Elizabeth [name] Treating Callahan Wild/Extender: Melburn Hake, Elizabeth [name] Weeks in Treatment: 0 Wound Status Wound Number: 2 Primary Trauma, Other Etiology: Wound Location: Left Lower Leg - Anterior Wound Open Wounding Event: Trauma Status: Date Acquired: 11/17/2017 Comorbid Congestive Heart Failure, Coronary Artery Weeks Of Treatment: 0 History: Disease, End Stage Renal Disease Clustered Wound: No Photos Wound Measurements Length: (cm) 1 Width: (cm) 0.8 Depth: (cm) 0.1 Area: (cm) 0.628 Volume: (cm) 0.063 % Reduction in Area: 0% % Reduction in Volume: 0% Tunneling: No Undermining: No Wound Description Full Thickness Without Exposed Support Classification: Structures Wound Margin: Flat and Intact Exudate Medium Amount: Exudate Type: Serosanguineous Exudate Color: red, brown Foul Odor After Cleansing: No Slough/Fibrino No Wound Bed Granulation Amount: Medium (34-66%) Exposed Structure Granulation Quality: Red Fascia Exposed: No Necrotic Amount: Medium (34-66%) Fat Layer (Subcutaneous Tissue) Exposed: Yes Necrotic Quality: Adherent Slough Tendon Exposed: No Muscle Exposed: No Joint Exposed: No Bone Exposed: No Periwound Skin Texture Stankey, Tynleigh C. ([id number]) Texture  Color No Abnormalities Noted: No No Abnormalities Noted: No Callus: No Atrophie Blanche: No Crepitus: No Cyanosis: No Excoriation: No Ecchymosis: No Induration: No Erythema: No Rash: No Hemosiderin Staining: Yes Scarring: No Mottled: No Pallor: No Moisture Rubor: No No Abnormalities Noted: No Dry / Scaly: No Temperature / Pain Maceration: No Tenderness on Palpation: Yes Wound Preparation Ulcer Cleansing: Rinsed/Irrigated with Saline Topical Anesthetic Applied: Other: lidocaine 4%, Treatment Notes Wound #2 (Left, Anterior Lower Leg) Notes prisma, non adherent  pad and tegaderm Electronic Signature(s) Signed: 01/17/2018 2:23:04 PM By: Elizabeth [name] Signed: 01/17/2018 3:39:37 PM By: Elizabeth [name] Entered By: Elizabeth [name] on 01/17/2018 14:23:03 Garver, Mysha C. ([id number]) -------------------------------------------------------------------------------- [place] Details Patient Name: Cantu, Makiyah C. Date of Service: 01/17/2018 8:45 AM Medical Record Number: [id number] Patient Account Number: [id number] Date of Birth/Sex: Jun 07, 1931 (86 y.o. F) Treating RN: Elizabeth [name] Primary Care Josel Keo: Elizabeth [name] Other Clinician: Referring Hiliana Eilts: Elizabeth [name] Treating Lavontae Cornia/Extender: Melburn Hake, Elizabeth [name] Weeks in Treatment: 0 Vital Signs Time Taken: 08:43 Temperature (F): 97.7 Height (in): 61 Pulse (bpm): 72 Source: Stated Respiratory Rate (breaths/min): 16 Weight (lbs): 100 Blood Pressure (mmHg): 141/84 Source: Stated Reference Range: 80 - 120 mg / dl Body Mass Index (BMI): 18.9 Electronic Signature(s) Signed: 01/17/2018 3:31:07 PM By: Elizabeth [name] Entered By: Becky Sax, Amado Nash on 01/17/2018 08:46:22

## 2018-01-21 ENCOUNTER — Ambulatory Visit: Payer: PPO | Admitting: Physician Assistant

## 2018-01-24 ENCOUNTER — Encounter: Payer: PPO | Admitting: Physician Assistant

## 2018-01-24 DIAGNOSIS — L97822 Non-pressure chronic ulcer of other part of left lower leg with fat layer exposed: Secondary | ICD-10-CM | POA: Diagnosis not present

## 2018-01-24 DIAGNOSIS — L97812 Non-pressure chronic ulcer of other part of right lower leg with fat layer exposed: Secondary | ICD-10-CM | POA: Diagnosis not present

## 2018-01-30 NOTE — Progress Notes (Signed)
ONIE, KASPAREK ([id number]) Visit Report for 01/24/2018 Arrival Information Details Patient Name: Elizabeth Fields, [name] Date of Service: 01/24/2018 1:00 PM Medical Record Number: [id number] Patient Account Number: [id number] Date of Birth/Sex: 1931-12-04 (86 y.o. F) Treating RN: Harold Barban Primary Care Iwao Shamblin: Harrel Lemon Other Clinician: Referring Jayliah Benett: Harrel Lemon Treating Eryka Dolinger/Extender: Melburn Hake, HOYT Weeks in Treatment: 1 Visit Information History Since Last Visit Added or deleted any medications: No Patient Arrived: Ambulatory Any new allergies or adverse reactions: No Arrival Time: 12:57 Had a fall or experienced change in No Accompanied By: self activities of daily living that may affect Transfer Assistance: None risk of falls: Patient Identification Verified: Yes Signs or symptoms of abuse/neglect since last visito No Secondary Verification Process Completed: Yes Hospitalized since last visit: No Implantable device outside of the clinic excluding No cellular tissue based products placed in the center since last visit: Has Dressing in Place as Prescribed: Yes Pain Present Now: No Electronic Signature(s) Signed: 01/24/2018 3:49:07 PM By: Lorine Bears RCP, RRT, CHT Entered By: Lorine Bears on 01/24/2018 12:58:08 Mohamud, Evonda C. ([id number]) -------------------------------------------------------------------------------- Lower Extremity Assessment Details Patient Name: Elizabeth Fields, [name] Date of Service: 01/24/2018 1:00 PM Medical Record Number: [id number] Patient Account Number: [id number] Date of Birth/Sex: 1931-01-24 (86 y.o. F) Treating RN: Secundino Ginger Primary Care Nanea Jared: Harrel Lemon Other Clinician: Referring Kearsten Ginther: Harrel Lemon Treating Benjamin Casanas/Extender: Melburn Hake, HOYT Weeks in Treatment: 1 Edema Assessment Assessed: [Left: No] [Right: No] Edema: [Left: N] [Right: o] Calf Left: Right: Point of Measurement: 26 cm  From Medial Instep 30.5 cm 30 cm Ankle Left: Right: Point of Measurement: 11 cm From Medial Instep 20 cm 19 cm Vascular Assessment Claudication: Claudication Assessment [Left:None] [Right:None] Pulses: Dorsalis Pedis Palpable: [Left:Yes] [Right:Yes] Posterior Tibial Extremity colors, hair growth, and conditions: Extremity Color: [Left:Hyperpigmented] [Right:Hyperpigmented] Hair Growth on Extremity: [Left:No] [Right:No] Temperature of Extremity: [Left:Warm] [Right:Warm] Capillary Refill: [Left:< 3 seconds] [Right:< 3 seconds] Toe Nail Assessment Left: Right: Thick: No No Discolored: No No Deformed: No No Improper Length and Hygiene: No No Electronic Signature(s) Signed: 01/24/2018 2:50:02 PM By: Secundino Ginger Entered By: Secundino Ginger on 01/24/2018 13:19:14 Slone, Shelbie C. ([id number]) -------------------------------------------------------------------------------- Multi Wound Chart Details Patient Name: Elizabeth Fields, [name] Date of Service: 01/24/2018 1:00 PM Medical Record Number: [id number] Patient Account Number: [id number] Date of Birth/Sex: 04/08/1931 (86 y.o. F) Treating RN: Harold Barban Primary Care Chiante Peden: Harrel Lemon Other Clinician: Referring Shilo Pauwels: Harrel Lemon Treating Iveth Heidemann/Extender: Melburn Hake, HOYT Weeks in Treatment: 1 Vital Signs Height(in): 61 Pulse(bpm): 84 Weight(lbs): 100 Blood Pressure(mmHg): 137/60 Body Mass Index(BMI): 19 Temperature(F): 97.5 Respiratory Rate 16 (breaths/min): Photos: [N/A:N/A] Wound Location: Right Lower Leg - Anterior Left Lower Leg - Anterior N/A Wounding Event: Trauma Trauma N/A Primary Etiology: Trauma, Other Trauma, Other N/A Comorbid History: Congestive Heart Failure, Congestive Heart Failure, N/A Coronary Artery Disease, End Coronary Artery Disease, End Stage Renal Disease Stage Renal Disease Date Acquired: 12/26/2017 11/17/2017 N/A Weeks of Treatment: 1 1 N/A Wound Status: Open Open N/A Measurements L x W x D  1.6x1x0.1 1.5x1.4x0.3 N/A (cm) Area (cm) : 1.257 1.649 N/A Volume (cm) : 0.126 0.495 N/A % Reduction in Area: 51.50% -162.60% N/A % Reduction in Volume: 51.40% -685.70% N/A Classification: Full Thickness Without Full Thickness Without N/A Exposed Support Structures Exposed Support Structures Exudate Amount: None Present Medium N/A Exudate Type: N/A Serosanguineous N/A Exudate Color: N/A red, brown N/A Wound Margin: Flat and Intact Flat and Intact N/A Granulation Amount: None Present (0%) None Present (0%) N/A Necrotic Amount:  Large (67-100%) Large (67-100%) N/A Exposed Structures: Fat Layer (Subcutaneous Fat Layer (Subcutaneous N/A Tissue) Exposed: Yes Tissue) Exposed: Yes Fascia: No Fascia: No Tendon: No Tendon: No Muscle: No Muscle: No Vankuren, Lou C. ([id number]) Joint: No Joint: No Bone: No Bone: No Periwound Skin Texture: Excoriation: No Excoriation: No N/A Induration: No Induration: No Callus: No Callus: No Crepitus: No Crepitus: No Rash: No Rash: No Scarring: No Scarring: No Periwound Skin Moisture: Maceration: No Maceration: No N/A Dry/Scaly: No Dry/Scaly: No Periwound Skin Color: Hemosiderin Staining: Yes Hemosiderin Staining: Yes N/A Atrophie Blanche: No Atrophie Blanche: No Cyanosis: No Cyanosis: No Ecchymosis: No Ecchymosis: No Erythema: No Erythema: No Mottled: No Mottled: No Pallor: No Pallor: No Rubor: No Rubor: No Tenderness on Palpation: Yes Yes N/A Wound Preparation: Ulcer Cleansing: Ulcer Cleansing: N/A Rinsed/Irrigated with Saline Rinsed/Irrigated with Saline Topical Anesthetic Applied: Topical Anesthetic Applied: Other: lidocaine 4% Other: lidocaine 4% Treatment Notes Electronic Signature(s) Signed: 01/27/2018 5:13:49 PM By: Harold Barban Entered By: Harold Barban on 01/24/2018 13:30:33 Sluder, Sheronica C. ([id number]) -------------------------------------------------------------------------------- [place] Details Patient Name: Elizabeth Fields, [name] Date of Service: 01/24/2018 1:00 PM Medical Record Number: [id number] Patient Account Number: [id number] Date of Birth/Sex: [birth year] (86 y.o. F) Treating RN: Harold Barban Primary Care Katlyne Nishida: Harrel Lemon Other Clinician: Referring Srikar Chiang: Harrel Lemon Treating Quatavious Rossa/Extender: Melburn Hake, HOYT Weeks in Treatment: 1 Active Inactive Abuse / Safety / Falls / Self Care Management Nursing Diagnoses: Potential for falls Goals: Patient will not experience any injury related to falls Date Initiated: 01/17/2018 Target Resolution Date: 04/12/2018 Goal Status: Active Interventions: Assess fall risk on admission and as needed Notes: Orientation to the Wound Care Program Nursing Diagnoses: Knowledge deficit related to the wound healing center program Goals: Patient/caregiver will verbalize understanding of the [facility] Date Initiated: 01/17/2018 Target Resolution Date: 04/12/2018 Goal Status: Active Interventions: Provide education on orientation to the wound center Notes: Wound/Skin Impairment Nursing Diagnoses: Impaired tissue integrity Goals: Ulcer/skin breakdown will heal within 14 weeks Date Initiated: 01/17/2018 Target Resolution Date: 04/12/2018 Goal Status: Active Interventions: Assess patient/caregiver ability to obtain necessary supplies Pierron, Gia C. ([id number]) Assess patient/caregiver ability to perform ulcer/skin care regimen upon admission and as needed Assess ulceration(s) every visit Notes: Electronic Signature(s) Signed: 01/27/2018 5:13:49 PM By: Harold Barban Entered By: Harold Barban on 01/24/2018 13:30:24 Blahnik, Makinzy C. ([id number]) -------------------------------------------------------------------------------- Pain Assessment Details Patient Name: Elizabeth Fields, [name] Date of Service: 01/24/2018 1:00 PM Medical Record Number: [id number] Patient Account Number: [id number] Date of  Birth/Sex: [birth year] (86 y.o. F) Treating RN: Harold Barban Primary Care Leita Lindbloom: Harrel Lemon Other Clinician: Referring Aerilyn Slee: Harrel Lemon Treating Essance Gatti/Extender: Melburn Hake, HOYT Weeks in Treatment: 1 Active Problems Location of Pain Severity and Description of Pain Patient Has Paino No Site Locations Pain Management and Medication Current Pain Management: Electronic Signature(s) Signed: 01/24/2018 3:49:07 PM By: Lorine Bears RCP, RRT, CHT Signed: 01/27/2018 5:13:49 PM By: Harold Barban Entered By: Lorine Bears on 01/24/2018 12:58:17 Prindiville, Aolani C. ([id number]) -------------------------------------------------------------------------------- Patient/Caregiver Education Details Patient Name: Elizabeth Fields, [name] Date of Service: 01/24/2018 1:00 PM Medical Record Number: [id number] Patient Account Number: [id number] Date of Birth/Gender: [birth year] (85 y.o. F) Treating RN: Harold Barban Primary Care Physician: Harrel Lemon Other Clinician: Referring Physician: Harrel Lemon Treating Physician/Extender: Sharalyn Ink in Treatment: 1 Education Assessment Education Provided To: Patient Education Topics Provided Wound/Skin Impairment: Handouts: Caring for Your Ulcer Methods: Demonstration, Explain/Verbal Responses: State content correctly Electronic Signature(s) Signed: 01/27/2018 5:13:49 PM By: Harold Barban Entered By:  Harold Barban on 01/24/2018 13:30:48 Elizabeth Fields, [name] ([id number]) -------------------------------------------------------------------------------- Wound Assessment Details Patient Name: Elizabeth Fields, [name] Date of Service: 01/24/2018 1:00 PM Medical Record Number: [id number] Patient Account Number: [id number] Date of Birth/Sex: 01-Aug-1931 (86 y.o. F) Treating RN: Secundino Ginger Primary Care Elpidio Thielen: Harrel Lemon Other Clinician: Referring Mylinh Cragg: Harrel Lemon Treating Willer Osorno/Extender: Melburn Hake, HOYT Weeks in  Treatment: 1 Wound Status Wound Number: 1 Primary Trauma, Other Etiology: Wound Location: Right Lower Leg - Anterior Wound Open Wounding Event: Trauma Status: Date Acquired: 12/26/2017 Comorbid Congestive Heart Failure, Coronary Artery Weeks Of Treatment: 1 History: Disease, End Stage Renal Disease Clustered Wound: No Photos Wound Measurements Length: (cm) 1.6 % Reduct Width: (cm) 1 % Reduct Depth: (cm) 0.1 Tunnelin Area: (cm) 1.257 Undermi Volume: (cm) 0.126 Star Endin Maxim ion in Area: 51.5% ion in Volume: 51.4% g: No ning: Yes ting Position (o'clock): 12 g Position (o'clock): 12 um Distance: (cm) 0.5 Wound Description Full Thickness Without Exposed Support Foul Odo Classification: Structures Slough/F Wound Margin: Flat and Intact Exudate None Present Amount: r After Cleansing: No ibrino No Wound Bed Granulation Amount: None Present (0%) Exposed Structure Necrotic Amount: Large (67-100%) Fascia Exposed: No Necrotic Quality: Adherent Slough Fat Layer (Subcutaneous Tissue) Exposed: Yes Tendon Exposed: No Muscle Exposed: No Joint Exposed: No Bone Exposed: No Ricard, Mckala C. ([id number]) Periwound Skin Texture Texture Color No Abnormalities Noted: No No Abnormalities Noted: No Callus: No Atrophie Blanche: No Crepitus: No Cyanosis: No Excoriation: No Ecchymosis: No Induration: No Erythema: No Rash: No Hemosiderin Staining: Yes Scarring: No Mottled: No Pallor: No Moisture Rubor: No No Abnormalities Noted: No Dry / Scaly: No Temperature / Pain Maceration: No Tenderness on Palpation: Yes Wound Preparation Ulcer Cleansing: Rinsed/Irrigated with Saline Topical Anesthetic Applied: Other: lidocaine 4%, Electronic Signature(s) Signed: 01/24/2018 2:50:02 PM By: Secundino Ginger Signed: 01/27/2018 5:13:49 PM By: Harold Barban Entered By: Harold Barban on 01/24/2018 13:39:16 Elizabeth Fields, [name]  ([id number]) -------------------------------------------------------------------------------- Wound Assessment Details Patient Name: Elizabeth Fields, [name] Date of Service: 01/24/2018 1:00 PM Medical Record Number: [id number] Patient Account Number: [id number] Date of Birth/Sex: Mar 15, 1931 (86 y.o. F) Treating RN: Secundino Ginger Primary Care Laurabeth Yip: Harrel Lemon Other Clinician: Referring Jaime Grizzell: Harrel Lemon Treating Kemp Gomes/Extender: Melburn Hake, HOYT Weeks in Treatment: 1 Wound Status Wound Number: 2 Primary Trauma, Other Etiology: Wound Location: Left Lower Leg - Anterior Wound Open Wounding Event: Trauma Status: Date Acquired: 11/17/2017 Comorbid Congestive Heart Failure, Coronary Artery Weeks Of Treatment: 1 History: Disease, End Stage Renal Disease Clustered Wound: No Photos Photo Uploaded By: Secundino Ginger on 01/24/2018 13:25:26 Wound Measurements Length: (cm) 1.5 Width: (cm) 1.4 Depth: (cm) 0.3 Area: (cm) 1.649 Volume: (cm) 0.495 % Reduction in Area: -162.6% % Reduction in Volume: -685.7% Tunneling: No Undermining: No Wound Description Full Thickness Without Exposed Support Foul Od Classification: Structures Slough/ Wound Margin: Flat and Intact Exudate Medium Amount: Exudate Type: Serosanguineous Exudate Color: red, brown or After Cleansing: No Fibrino No Wound Bed Granulation Amount: None Present (0%) Exposed Structure Necrotic Amount: Large (67-100%) Fascia Exposed: No Necrotic Quality: Adherent Slough Fat Layer (Subcutaneous Tissue) Exposed: Yes Tendon Exposed: No Muscle Exposed: No Joint Exposed: No Bone Exposed: No Elizabeth Fields, [name] ([id number]) Periwound Skin Texture Texture Color No Abnormalities Noted: No No Abnormalities Noted: No Callus: No Atrophie Blanche: No Crepitus: No Cyanosis: No Excoriation: No Ecchymosis: No Induration: No Erythema: No Rash: No Hemosiderin Staining: Yes Scarring: No Mottled: No Pallor: No Moisture Rubor:  No No Abnormalities Noted: No Dry / Scaly: No Temperature / Pain Maceration:  No Tenderness on Palpation: Yes Wound Preparation Ulcer Cleansing: Rinsed/Irrigated with Saline Topical Anesthetic Applied: Other: lidocaine 4%, Electronic Signature(s) Signed: 01/24/2018 2:50:02 PM By: Secundino Ginger Entered By: Secundino Ginger on 01/24/2018 13:16:57 Elizabeth Fields, [name] ([id number]) -------------------------------------------------------------------------------- Vitals Details Patient Name: Elizabeth Fields, [name] Date of Service: 01/24/2018 1:00 PM Medical Record Number: [id number] Patient Account Number: [id number] Date of Birth/Sex: April 06, 1931 (86 y.o. F) Treating RN: Harold Barban Primary Care Jaydi Bray: Harrel Lemon Other Clinician: Referring Lavetta Geier: Harrel Lemon Treating Dayton Kenley/Extender: Melburn Hake, HOYT Weeks in Treatment: 1 Vital Signs Time Taken: 12:58 Temperature (F): 97.5 Height (in): 61 Pulse (bpm): 69 Weight (lbs): 100 Respiratory Rate (breaths/min): 16 Body Mass Index (BMI): 18.9 Blood Pressure (mmHg): 137/60 Reference Range: 80 - 120 mg / dl Electronic Signature(s) Signed: 01/24/2018 3:49:07 PM By: Lorine Bears RCP, RRT, CHT Entered By: Lorine Bears on 01/24/2018 13:02:28

## 2018-01-31 ENCOUNTER — Encounter: Payer: PPO | Admitting: Physician Assistant

## 2018-01-31 DIAGNOSIS — S81801A Unspecified open wound, right lower leg, initial encounter: Secondary | ICD-10-CM | POA: Diagnosis not present

## 2018-01-31 DIAGNOSIS — L97812 Non-pressure chronic ulcer of other part of right lower leg with fat layer exposed: Secondary | ICD-10-CM | POA: Diagnosis not present

## 2018-01-31 DIAGNOSIS — L97822 Non-pressure chronic ulcer of other part of left lower leg with fat layer exposed: Secondary | ICD-10-CM | POA: Diagnosis not present

## 2018-02-01 NOTE — Progress Notes (Signed)
Elizabeth Fields ([id number]) Visit Report for 01/31/2018 Chief Complaint Document Details Patient Name: Elizabeth Fields, [name] Date of Service: 01/31/2018 11:00 AM Medical Record Number: [id number] Patient Account Number: [id number] Date of Birth/Sex: [birth year] (86 y.o. F) Treating RN: Montey Hora Primary Care Provider: Harrel Lemon Other Clinician: Referring Provider: Harrel Lemon Treating Provider/Extender: Melburn Hake, HOYT Weeks in Treatment: 2 Information Obtained from: Patient Chief Complaint Bilateral LE Ulcers Electronic Signature(s) Signed: 01/31/2018 5:23:14 PM By: Worthy Keeler PA-Fields Entered By: Worthy Keeler on 01/31/2018 11:27:29 Elizabeth Fields, [name] ([id number]) -------------------------------------------------------------------------------- Debridement Details Patient Name: [name] Date of Service: 01/31/2018 11:00 AM Medical Record Number: [id number] Patient Account Number: [id number] Date of Birth/Sex: 11/07/1931 (86 y.o. F) Treating RN: Montey Hora Primary Care Provider: Harrel Lemon Other Clinician: Referring Provider: Harrel Lemon Treating Provider/Extender: Melburn Hake, HOYT Weeks in Treatment: 2 Debridement Performed for Wound #2 Left,Anterior Lower Leg Assessment: Performed By: Physician STONE III, HOYT E., PA-Fields Debridement Type: Debridement Level of Consciousness (Pre- Awake and Alert procedure): Pre-procedure Verification/Time Yes - 11:15 Out Taken: Start Time: 11:15 Pain Control: Lidocaine 4% Topical Solution Total Area Debrided (L x W): 1.1 (cm) x 1 (cm) = 1.1 (cm) Tissue and other material Viable, Non-Viable, Slough, Subcutaneous, Biofilm, Slough debrided: Level: Skin/Subcutaneous Tissue Debridement Description: Excisional Instrument: Curette Bleeding: Minimum Hemostasis Achieved: Pressure End Time: 11:17 Procedural Pain: 0 Post Procedural Pain: 0 Response to Treatment: Procedure was tolerated well Level of Consciousness Awake and  Alert (Post-procedure): Post Debridement Measurements of Total Wound Length: (cm) 1.1 Width: (cm) 1 Depth: (cm) 0.4 Volume: (cm) 0.346 Character of Wound/Ulcer Post Debridement: Improved Post Procedure Diagnosis Same as Pre-procedure Electronic Signature(s) Signed: 01/31/2018 3:43:47 PM By: Montey Hora Signed: 01/31/2018 5:23:14 PM By: Worthy Keeler PA-Fields Entered By: Montey Hora on 01/31/2018 11:17:05 Elizabeth Fields, [name] ([id number]) -------------------------------------------------------------------------------- Debridement Details Patient Name: [name] Date of Service: 01/31/2018 11:00 AM Medical Record Number: [id number] Patient Account Number: [id number] Date of Birth/Sex: 1931/01/11 (86 y.o. F) Treating RN: Montey Hora Primary Care Provider: Harrel Lemon Other Clinician: Referring Provider: Harrel Lemon Treating Provider/Extender: Melburn Hake, HOYT Weeks in Treatment: 2 Debridement Performed for Wound #1 Right,Anterior Lower Leg Assessment: Performed By: Physician STONE III, HOYT E., PA-Fields Debridement Type: Debridement Level of Consciousness (Pre- Awake and Alert procedure): Pre-procedure Verification/Time Yes - 11:17 Out Taken: Start Time: 11:17 Pain Control: Lidocaine 4% Topical Solution Total Area Debrided (L x W): 1.6 (cm) x 1 (cm) = 1.6 (cm) Tissue and other material Viable, Non-Viable, Slough, Subcutaneous, Biofilm, Slough debrided: Level: Skin/Subcutaneous Tissue Debridement Description: Excisional Instrument: Curette Bleeding: Minimum Hemostasis Achieved: Pressure End Time: 11:19 Procedural Pain: 0 Post Procedural Pain: 0 Response to Treatment: Procedure was tolerated well Level of Consciousness Awake and Alert (Post-procedure): Post Debridement Measurements of Total Wound Length: (cm) 1.6 Width: (cm) 1 Depth: (cm) 0.3 Volume: (cm) 0.377 Character of Wound/Ulcer Post Debridement: Improved Post Procedure Diagnosis Same as  Pre-procedure Electronic Signature(s) Signed: 01/31/2018 3:43:47 PM By: Montey Hora Signed: 01/31/2018 5:23:14 PM By: Worthy Keeler PA-Fields Entered By: Montey Hora on 01/31/2018 11:19:25 Elizabeth Fields, [name] ([id number]) -------------------------------------------------------------------------------- HPI Details Patient Name: Elizabeth Fields, [name] Date of Service: 01/31/2018 11:00 AM Medical Record Number: [id number] Patient Account Number: [id number] Date of Birth/Sex: 05-23-1931 (86 y.o. F) Treating RN: Montey Hora Primary Care Provider: Harrel Lemon Other Clinician: Referring Provider: Harrel Lemon Treating Provider/Extender: Melburn Hake, HOYT Weeks in Treatment: 2 History of Present Illness HPI Description: 01/17/18 on evaluation today patient presents for evaluation of  two ulcers both on the anterior portion of her lower extremities one right and one left. The right she has had for about three weeks the left for about eight weeks and both occurred as a result of her traumatizing her shins on the bottom of the card were trying to get out of the car. She states this is never happen with any other car doors before but she has been having some issues with this sharp area on the store in particular. Both occurred in the similar situation where she was trying not to open the door the whole way in order to avoid damaging anybody else's car beside them. She is been using peroxide initially to clean the wound although she has converted to the wound cleanser now. She has been using if anything just over the counter triple antibiotic ointment and a Band-Aid. With that being said she's not even been using that more recently. The most part she's just been attempting allow this to dry out/scab over. Her APIs were greater than 220 and noncompressible. Patient does have a history of paroxysmal atrial fibrillation for which she is on Coumadin. She also has chronic kidney disease stage III. She has been on  antibiotics that I see no evidence of infection right now which is good news. 01/24/18 on evaluation today patient appears to be doing excellent in regard to her lower extremity ulcers. She has been tolerating the dressing changes without complication which is excellent news. Overall I have been very pleased with her progress. Nonetheless she still has have some ways to go before this will be completely healed. The left is a little bit better in appearance compared to the right which is somewhat deeper. 01/31/18 on evaluation today patient appears to be doing better in regard to her bilateral anterior lower extremity ulcers. She has been tolerating the dressing changes without complication which is excellent news. Fortunately there does not appear to be any evidence of infection at this time which is also good news. I'm very pleased with how things seem to be progressing at this point. The patient likewise is very happy and states she's not having the pain that she was having previous. Electronic Signature(s) Signed: 01/31/2018 5:23:14 PM By: Worthy Keeler PA-Fields Entered By: Worthy Keeler on 01/31/2018 11:27:38 Elizabeth Fields, [name] ([id number]) -------------------------------------------------------------------------------- Physical Exam Details Patient Name: Nanni, Aaliah Fields. Date of Service: 01/31/2018 11:00 AM Medical Record Number: [id number] Patient Account Number: [id number] Date of Birth/Sex: 09-May-1931 (86 y.o. F) Treating RN: Montey Hora Primary Care Provider: Harrel Lemon Other Clinician: Referring Provider: Harrel Lemon Treating Provider/Extender: Melburn Hake, HOYT Weeks in Treatment: 2 Constitutional Well-nourished and well-hydrated in no acute distress. Respiratory normal breathing without difficulty. Psychiatric this patient is able to make decisions and demonstrates good insight into disease process. Alert and Oriented x 3. pleasant and cooperative. Notes Patient's wound bed  currently shows signs of good granulation there was some Slough noted that both sides which required some sharp debridement today. Post debridement the wound bed's appear to be doing separately better at both locations. Electronic Signature(s) Signed: 01/31/2018 5:23:14 PM By: Worthy Keeler PA-Fields Entered By: Worthy Keeler on 01/31/2018 11:28:05 Elizabeth Fields, Elizabeth Fields ([id number]) -------------------------------------------------------------------------------- Physician Orders Details Patient Name: Feng, Paxtyn Fields. Date of Service: 01/31/2018 11:00 AM Medical Record Number: [id number] Patient Account Number: [id number] Date of Birth/Sex: 21-May-1931 (86 y.o. F) Treating RN: Montey Hora Primary Care Provider: Harrel Lemon Other Clinician: Referring Provider: Harrel Lemon Treating Provider/Extender: Melburn Hake, HOYT  Weeks in Treatment: 2 Verbal / Phone Orders: No Diagnosis Coding Wound Cleansing Wound #1 Right,Anterior Lower Leg o Clean wound with Normal Saline. o May Shower, gently pat wound dry prior to applying new dressing. Wound #2 Left,Anterior Lower Leg o Clean wound with Normal Saline. o May Shower, gently pat wound dry prior to applying new dressing. Primary Wound Dressing Wound #1 Right,Anterior Lower Leg o Silver Collagen Wound #2 Left,Anterior Lower Leg o Silver Collagen Secondary Dressing Wound #1 Right,Anterior Lower Leg o Tegaderm o Non-adherent pad Wound #2 Left,Anterior Lower Leg o Tegaderm o Non-adherent pad Dressing Change Frequency Wound #1 Right,Anterior Lower Leg o Change Dressing Monday, Wednesday, Friday Wound #2 Left,Anterior Lower Leg o Change Dressing Monday, Wednesday, Friday Follow-up Appointments Wound #1 Right,Anterior Lower Leg o Return Appointment in 1 week. Wound #2 Left,Anterior Lower Leg o Return Appointment in 1 week. Edema Control Wound #1 Right,Anterior Lower Leg o Elevate legs to the level of the heart and  pump ankles as often as possible Belling, Nashayla Fields. ([id number]) Wound #2 Left,Anterior Lower Leg o Elevate legs to the level of the heart and pump ankles as often as possible Electronic Signature(s) Signed: 01/31/2018 3:43:47 PM By: Montey Hora Signed: 01/31/2018 5:23:14 PM By: Worthy Keeler PA-Fields Entered By: Montey Hora on 01/31/2018 11:21:23 Elizabeth Fields, [name] ([id number]) -------------------------------------------------------------------------------- Problem List Details Patient Name: Vales, Shawnette Fields. Date of Service: 01/31/2018 11:00 AM Medical Record Number: [id number] Patient Account Number: [id number] Date of Birth/Sex: 15-Feb-1931 (86 y.o. F) Treating RN: Montey Hora Primary Care Provider: Harrel Lemon Other Clinician: Referring Provider: Harrel Lemon Treating Provider/Extender: Melburn Hake, HOYT Weeks in Treatment: 2 Active Problems ICD-10 Evaluated Encounter Code Description Active Date Today Diagnosis L97.812 Non-pressure chronic ulcer of other part of right lower leg 01/17/2018 No Yes with fat layer exposed L97.822 Non-pressure chronic ulcer of other part of left lower leg with 01/17/2018 No Yes fat layer exposed S81.802A Unspecified open wound, left lower leg, initial encounter 01/17/2018 No Yes S81.801A Unspecified open wound, right lower leg, initial encounter 01/17/2018 No Yes I48.0 Paroxysmal atrial fibrillation 01/17/2018 No Yes Z79.01 Long term (current) use of anticoagulants 01/17/2018 No Yes N18.3 Chronic kidney disease, stage 3 (moderate) 01/17/2018 No Yes Inactive Problems Resolved Problems Electronic Signature(s) Signed: 01/31/2018 5:23:14 PM By: Worthy Keeler PA-Fields Entered By: Worthy Keeler on 01/31/2018 11:27:24 Elizabeth Fields, [name] ([id number]) -------------------------------------------------------------------------------- Progress Note Details Patient Name: Elizabeth Fields, [name] Date of Service: 01/31/2018 11:00 AM Medical Record Number: [id number] Patient Account  Number: [id number] Date of Birth/Sex: 03-02-1931 (86 y.o. F) Treating RN: Montey Hora Primary Care Provider: Harrel Lemon Other Clinician: Referring Provider: Harrel Lemon Treating Provider/Extender: Melburn Hake, HOYT Weeks in Treatment: 2 Subjective Chief Complaint Information obtained from Patient Bilateral LE Ulcers History of Present Illness (HPI) 01/17/18 on evaluation today patient presents for evaluation of two ulcers both on the anterior portion of her lower extremities one right and one left. The right she has had for about three weeks the left for about eight weeks and both occurred as a result of her traumatizing her shins on the bottom of the card were trying to get out of the car. She states this is never happen with any other car doors before but she has been having some issues with this sharp area on the store in particular. Both occurred in the similar situation where she was trying not to open the door the whole way in order to avoid damaging anybody else's car beside them. She is been  using peroxide initially to clean the wound although she has converted to the wound cleanser now. She has been using if anything just over the counter triple antibiotic ointment and a Band-Aid. With that being said she's not even been using that more recently. The most part she's just been attempting allow this to dry out/scab over. Her APIs were greater than 220 and noncompressible. Patient does have a history of paroxysmal atrial fibrillation for which she is on Coumadin. She also has chronic kidney disease stage III. She has been on antibiotics that I see no evidence of infection right now which is good news. 01/24/18 on evaluation today patient appears to be doing excellent in regard to her lower extremity ulcers. She has been tolerating the dressing changes without complication which is excellent news. Overall I have been very pleased with her progress. Nonetheless she still has have  some ways to go before this will be completely healed. The left is a little bit better in appearance compared to the right which is somewhat deeper. 01/31/18 on evaluation today patient appears to be doing better in regard to her bilateral anterior lower extremity ulcers. She has been tolerating the dressing changes without complication which is excellent news. Fortunately there does not appear to be any evidence of infection at this time which is also good news. I'm very pleased with how things seem to be progressing at this point. The patient likewise is very happy and states she's not having the pain that she was having previous. Patient History Information obtained from Patient. Family History Cancer - Mother, Diabetes - Siblings, Heart Disease - Father,Siblings, Hypertension - Siblings, Kidney Disease - Siblings, No family history of Hereditary Spherocytosis, Lung Disease, Seizures. Social History Never smoker, Marital Status - Married, Alcohol Use - Never, Drug Use - No History, Caffeine Use - Rarely. Review of Systems (ROS) Constitutional Symptoms (General Health) Denies complaints or symptoms of Fever, Chills. Respiratory The patient has no complaints or symptoms. Cardiovascular The patient has no complaints or symptoms. Psychiatric Poudrier, [place] ([id number]) The patient has no complaints or symptoms. Objective Constitutional Well-nourished and well-hydrated in no acute distress. Vitals Time Taken: 10:46 AM, Height: 61 in, Weight: 100 lbs, BMI: 18.9, Temperature: 97.7 F, Pulse: 90 bpm, Respiratory Rate: 16 breaths/min, Blood Pressure: 136/86 mmHg. Respiratory normal breathing without difficulty. Psychiatric this patient is able to make decisions and demonstrates good insight into disease process. Alert and Oriented x 3. pleasant and cooperative. General Notes: Patient's wound bed currently shows signs of good granulation there was some Slough noted that both sides which  required some sharp debridement today. Post debridement the wound bed's appear to be doing separately better at both locations. Integumentary (Hair, Skin) Wound #1 status is Open. Original cause of wound was Trauma. The wound is located on the Right,Anterior Lower Leg. The wound measures 1.6cm length x 1cm width x 0.4cm depth; 1.257cm^2 area and 0.503cm^3 volume. There is Fat Layer (Subcutaneous Tissue) Exposed exposed. There is no tunneling noted, however, there is undermining starting at 11:00 and ending at 3:00 with a maximum distance of 0.3cm. There is a small amount of serous drainage noted. The wound margin is flat and intact. There is no granulation within the wound bed. There is a large (67-100%) amount of necrotic tissue within the wound bed including Eschar and Adherent Slough. The periwound skin appearance exhibited: Hemosiderin Staining. The periwound skin appearance did not exhibit: Callus, Crepitus, Excoriation, Induration, Rash, Scarring, Dry/Scaly, Maceration, Atrophie Blanche, Cyanosis, Ecchymosis, Mottled,  Pallor, Rubor, Erythema. The periwound has tenderness on palpation. Wound #2 status is Open. Original cause of wound was Trauma. The wound is located on the Left,Anterior Lower Leg. The wound measures 1.1cm length x 1cm width x 0.3cm depth; 0.864cm^2 area and 0.259cm^3 volume. There is Fat Layer (Subcutaneous Tissue) Exposed exposed. There is no tunneling or undermining noted. There is a small amount of serous drainage noted. The wound margin is flat and intact. There is no granulation within the wound bed. There is a large (67-100%) amount of necrotic tissue within the wound bed including Adherent Slough. The periwound skin appearance exhibited: Hemosiderin Staining. The periwound skin appearance did not exhibit: Callus, Crepitus, Excoriation, Induration, Rash, Scarring, Dry/Scaly, Maceration, Atrophie Blanche, Cyanosis, Ecchymosis, Mottled, Pallor, Rubor, Erythema. The  periwound has tenderness on palpation. Assessment Active Problems ICD-10 Elizabeth Fields, [name] ([id number]) Non-pressure chronic ulcer of other part of right lower leg with fat layer exposed Non-pressure chronic ulcer of other part of left lower leg with fat layer exposed Unspecified open wound, left lower leg, initial encounter Unspecified open wound, right lower leg, initial encounter Paroxysmal atrial fibrillation Long term (current) use of anticoagulants Chronic kidney disease, stage 3 (moderate) Procedures Wound #1 Pre-procedure diagnosis of Wound #1 is a Trauma, Other located on the Right,Anterior Lower Leg . There was a Excisional Skin/Subcutaneous Tissue Debridement with a total area of 1.6 sq cm performed by STONE III, HOYT E., PA-Fields. With the following instrument(s): Curette to remove Viable and Non-Viable tissue/material. Material removed includes Subcutaneous Tissue, Slough, and Biofilm after achieving pain control using Lidocaine 4% Topical Solution. No specimens were taken. A time out was conducted at 11:17, prior to the start of the procedure. A Minimum amount of bleeding was controlled with Pressure. The procedure was tolerated well with a pain level of 0 throughout and a pain level of 0 following the procedure. Post Debridement Measurements: 1.6cm length x 1cm width x 0.3cm depth; 0.377cm^3 volume. Character of Wound/Ulcer Post Debridement is improved. Post procedure Diagnosis Wound #1: Same as Pre-Procedure Wound #2 Pre-procedure diagnosis of Wound #2 is a Trauma, Other located on the Left,Anterior Lower Leg . There was a Excisional Skin/Subcutaneous Tissue Debridement with a total area of 1.1 sq cm performed by STONE III, HOYT E., PA-Fields. With the following instrument(s): Curette to remove Viable and Non-Viable tissue/material. Material removed includes Subcutaneous Tissue, Slough, and Biofilm after achieving pain control using Lidocaine 4% Topical Solution. No specimens were  taken. A time out was conducted at 11:15, prior to the start of the procedure. A Minimum amount of bleeding was controlled with Pressure. The procedure was tolerated well with a pain level of 0 throughout and a pain level of 0 following the procedure. Post Debridement Measurements: 1.1cm length x 1cm width x 0.4cm depth; 0.346cm^3 volume. Character of Wound/Ulcer Post Debridement is improved. Post procedure Diagnosis Wound #2: Same as Pre-Procedure Plan Wound Cleansing: Wound #1 Right,Anterior Lower Leg: Clean wound with Normal Saline. May Shower, gently pat wound dry prior to applying new dressing. Wound #2 Left,Anterior Lower Leg: Clean wound with Normal Saline. May Shower, gently pat wound dry prior to applying new dressing. Primary Wound Dressing: Wound #1 Right,Anterior Lower Leg: Silver Collagen Wound #2 Left,Anterior Lower Leg: Silver Collagen Secondary Dressing: Elizabeth Fields, [name] ([id number]) Wound #1 Right,Anterior Lower Leg: Tegaderm Non-adherent pad Wound #2 Left,Anterior Lower Leg: Tegaderm Non-adherent pad Dressing Change Frequency: Wound #1 Right,Anterior Lower Leg: Change Dressing Monday, Wednesday, Friday Wound #2 Left,Anterior Lower Leg: Change Dressing Monday, Wednesday,  Friday Follow-up Appointments: Wound #1 Right,Anterior Lower Leg: Return Appointment in 1 week. Wound #2 Left,Anterior Lower Leg: Return Appointment in 1 week. Edema Control: Wound #1 Right,Anterior Lower Leg: Elevate legs to the level of the heart and pump ankles as often as possible Wound #2 Left,Anterior Lower Leg: Elevate legs to the level of the heart and pump ankles as often as possible I'm in a recommend that we continue with the above wound care measures for the next week. The patient is in agreement that plan. If anything changes or worsens meantime she will contact the office and let me know otherwise she continues to have really no significant swelling which is good news. Please  see above for specific wound care orders. We will see patient for re-evaluation in 1 week(s) here in the clinic. If anything worsens or changes patient will contact our office for additional recommendations. Electronic Signature(s) Signed: 01/31/2018 5:23:14 PM By: Worthy Keeler PA-Fields Entered By: Worthy Keeler on 01/31/2018 11:28:18 Elizabeth Fields, [name] ([id number]) -------------------------------------------------------------------------------- ROS/PFSH Details Patient Name: Hanke, Dashea Fields. Date of Service: 01/31/2018 11:00 AM Medical Record Number: [id number] Patient Account Number: [id number] Date of Birth/Sex: 12-15-1931 (86 y.o. F) Treating RN: Montey Hora Primary Care Provider: Harrel Lemon Other Clinician: Referring Provider: Harrel Lemon Treating Provider/Extender: Melburn Hake, HOYT Weeks in Treatment: 2 Information Obtained From Patient Wound History Do you currently have one or more open woundso Yes How many open wounds do you currently haveo 2 Approximately how long have you had your woundso 8 weeks How have you been treating your wound(s) until nowo peroxide, neosporin and wound cleanser and bandage. Has your wound(s) ever healed and then re-openedo No Have you had any lab work done in the past montho No Have you tested positive for an antibiotic resistant organism No (MRSA, VRE)o Have you tested positive for osteomyelitis (bone infection)o No Have you had any tests for circulation on your legso No Constitutional Symptoms (General Health) Complaints and Symptoms: Negative for: Fever; Chills Eyes Medical History: Negative for: Cataracts; Glaucoma; Optic Neuritis Ear/Nose/Mouth/Throat Medical History: Negative for: Chronic sinus problems/congestion; Middle ear problems Hematologic/Lymphatic Medical History: Negative for: Anemia; Hemophilia; Elizabeth Fields Immunodeficiency Virus; Lymphedema; Sickle Cell Disease Respiratory Complaints and Symptoms: No Complaints or  Symptoms Medical History: Negative for: Aspiration; Asthma; Chronic Obstructive Pulmonary Disease (COPD); Pneumothorax; Sleep Apnea; Tuberculosis Cardiovascular Complaints and Symptoms: No Complaints or Symptoms Pugsley, Vella Fields. ([id number]) Medical History: Positive for: Congestive Heart Failure; Coronary Artery Disease Negative for: Angina; Arrhythmia; Deep Vein Thrombosis; Hypertension; Hypotension; Myocardial Infarction; Peripheral Arterial Disease; Peripheral Venous Disease; Phlebitis; Vasculitis Gastrointestinal Medical History: Negative for: Cirrhosis ; Colitis; Crohnos; Hepatitis A; Hepatitis B; Hepatitis Fields Endocrine Medical History: Negative for: Type I Diabetes; Type II Diabetes Genitourinary Medical History: Positive for: End Stage Renal Disease Immunological Medical History: Negative for: Lupus Erythematosus; Raynaudos; Scleroderma Integumentary (Skin) Medical History: Negative for: History of Burn; History of pressure wounds Musculoskeletal Medical History: Negative for: Gout; Rheumatoid Arthritis; Osteoarthritis; Osteomyelitis Neurologic Medical History: Negative for: Dementia; Neuropathy; Quadriplegia; Paraplegia Oncologic Medical History: Negative for: Received Chemotherapy; Received Radiation Psychiatric Complaints and Symptoms: No Complaints or Symptoms Medical History: Negative for: Anorexia/bulimia; Confinement Anxiety Immunizations Pneumococcal Vaccine: Received Pneumococcal Vaccination: Yes Implantable Devices Stallings, Deneene Fields. ([id number]) Family and Social History Cancer: Yes - Mother; Diabetes: Yes - Siblings; Heart Disease: Yes - Father,Siblings; Hereditary Spherocytosis: No; Hypertension: Yes - Siblings; Kidney Disease: Yes - Siblings; Lung Disease: No; Seizures: No; Never smoker; Marital Status - Married; Alcohol Use: Never; Drug Use:  No History; Caffeine Use: Rarely; Financial Concerns: No; Food, Clothing or Shelter Needs: No; Support System  Lacking: No; Transportation Concerns: No; Advanced Directives: Yes (Not Provided); Patient does not want information on Advanced Directives; Do not resuscitate: Yes (Not Provided); Living Will: Yes (Not Provided); Medical Power of Attorney: Yes (Not Provided) Physician Affirmation I have reviewed and agree with the above information. Electronic Signature(s) Signed: 01/31/2018 3:43:47 PM By: Montey Hora Signed: 01/31/2018 5:23:14 PM By: Worthy Keeler PA-Fields Entered By: Worthy Keeler on 01/31/2018 11:27:55 Connolly, Aminah Fields. ([id number]) -------------------------------------------------------------------------------- SuperBill Details Patient Name: Hymes, Lataysha Fields. Date of Service: 01/31/2018 Medical Record Number: [id number] Patient Account Number: [id number] Date of Birth/Sex: 02/27/1931 (85 y.o. F) Treating RN: Montey Hora Primary Care Provider: Harrel Lemon Other Clinician: Referring Provider: Harrel Lemon Treating Provider/Extender: Melburn Hake, HOYT Weeks in Treatment: 2 Diagnosis Coding ICD-10 Codes Code Description [phone] Non-pressure chronic ulcer of other part of right lower leg with fat layer exposed L97.822 Non-pressure chronic ulcer of other part of left lower leg with fat layer exposed S81.802A Unspecified open wound, left lower leg, initial encounter S81.801A Unspecified open wound, right lower leg, initial encounter I48.0 Paroxysmal atrial fibrillation Z79.01 Long term (current) use of anticoagulants N18.3 Chronic kidney disease, stage 3 (moderate) Facility Procedures CPT4 Code Description: [id number] 11042 - DEB SUBQ TISSUE 20 SQ CM/< ICD-10 Diagnosis Description X50.569 Non-pressure chronic ulcer of other part of right lower leg wi L97.822 Non-pressure chronic ulcer of other part of left lower leg wit Modifier: th fat layer expo Elizabeth Fields fat layer expos Quantity: 1 sed ed Physician Procedures CPT4 Code Description: [id number] 11042 - WC PHYS SUBQ TISS 20 SQ CM ICD-10 Diagnosis  Description P53.748 Non-pressure chronic ulcer of other part of right lower leg wi L97.822 Non-pressure chronic ulcer of other part of left lower leg wit Modifier: th fat layer expo Elizabeth Fields fat layer expos Quantity: 1 sed ed Electronic Signature(s) Signed: 01/31/2018 5:23:14 PM By: Worthy Keeler PA-Fields Entered By: Worthy Keeler on 01/31/2018 11:28:29

## 2018-02-01 NOTE — Progress Notes (Signed)
MONASIA, LAIR ([id number]) Visit Report for 01/31/2018 Arrival Information Details Patient Name: Santee, Yazhini C. Date of Service: 01/31/2018 11:00 AM Medical Record Number: [id number] Patient Account Number: [id number] Date of Birth/Sex: 18-Nov-1931 (86 y.o. F) Treating RN: Montey Hora Primary Care Inesha Sow: Harrel Lemon Other Clinician: Referring Sophiea Ueda: Harrel Lemon Treating Sammie Schermerhorn/Extender: Melburn Hake, HOYT Weeks in Treatment: 2 Visit Information History Since Last Visit Added or deleted any medications: No Patient Arrived: Ambulatory Any new allergies or adverse reactions: No Arrival Time: 10:46 Had a fall or experienced change in No Accompanied By: self activities of daily living that may affect Transfer Assistance: None risk of falls: Patient Identification Verified: Yes Signs or symptoms of abuse/neglect since last visito No Secondary Verification Process Completed: Yes Hospitalized since last visit: No Implantable device outside of the clinic excluding No cellular tissue based products placed in the center since last visit: Has Dressing in Place as Prescribed: Yes Pain Present Now: No Electronic Signature(s) Signed: 01/31/2018 3:57:43 PM By: Lorine Bears RCP, RRT, CHT Entered By: Lorine Bears on 01/31/2018 10:46:36 Mccrackin, Chaneka C. ([id number]) -------------------------------------------------------------------------------- Encounter Discharge Information Details Patient Name: Geier, Ethne C. Date of Service: 01/31/2018 11:00 AM Medical Record Number: [id number] Patient Account Number: [id number] Date of Birth/Sex: [birth year] (86 y.o. F) Treating RN: Montey Hora Primary Care Kona Yusuf: Harrel Lemon Other Clinician: Referring Keysha Damewood: Harrel Lemon Treating Christianjames Soule/Extender: Melburn Hake, HOYT Weeks in Treatment: 2 Encounter Discharge Information Items Post Procedure Vitals Discharge Condition: Stable Temperature (F):  97.7 Ambulatory Status: Ambulatory Pulse (bpm): 90 Discharge Destination: Home Respiratory Rate (breaths/min): 16 Transportation: Private Auto Blood Pressure (mmHg): 136/86 Accompanied By: self Schedule Follow-up Appointment: Yes Clinical Summary of Care: Electronic Signature(s) Signed: 01/31/2018 3:43:47 PM By: Montey Hora Entered By: Montey Hora on 01/31/2018 11:23:37 Ernest, Katlin C. ([id number]) -------------------------------------------------------------------------------- Lower Extremity Assessment Details Patient Name: Mirabella, Malijah C. Date of Service: 01/31/2018 11:00 AM Medical Record Number: [id number] Patient Account Number: [id number] Date of Birth/Sex: Oct 21, 1931 (86 y.o. F) Treating RN: Secundino Ginger Primary Care Breniyah Romm: Harrel Lemon Other Clinician: Referring Chanley Mcenery: Harrel Lemon Treating Ginger Leeth/Extender: Melburn Hake, HOYT Weeks in Treatment: 2 Edema Assessment Assessed: [Left: No] [Right: No] [Left: Edema] [Right: :] Calf Left: Right: Point of Measurement: 26 cm From Medial Instep 30 cm 29 cm Ankle Left: Right: Point of Measurement: 11 cm From Medial Instep 20 cm 19 cm Vascular Assessment Claudication: Claudication Assessment [Left:None] [Right:None] Pulses: Dorsalis Pedis Palpable: [Left:Yes] [Right:Yes] Posterior Tibial Extremity colors, hair growth, and conditions: Extremity Color: [Left:Hyperpigmented] [Right:Hyperpigmented] Hair Growth on Extremity: [Left:No] [Right:No] Temperature of Extremity: [Left:Warm] [Right:Warm] Capillary Refill: [Right:< 3 seconds] Toe Nail Assessment Left: Right: Thick: No No Discolored: No No Deformed: No No Improper Length and Hygiene: No No Electronic Signature(s) Signed: 01/31/2018 3:23:26 PM By: Secundino Ginger Entered By: Secundino Ginger on 01/31/2018 10:59:45 Stigger, Jerie C. ([id number]) -------------------------------------------------------------------------------- Multi Wound Chart Details Patient Name: Fawcett, Joi  C. Date of Service: 01/31/2018 11:00 AM Medical Record Number: [id number] Patient Account Number: [id number] Date of Birth/Sex: [birth year] (86 y.o. F) Treating RN: Montey Hora Primary Care Miia Blanks: Harrel Lemon Other Clinician: Referring Charnise Lovan: Harrel Lemon Treating Zara Wendt/Extender: Melburn Hake, HOYT Weeks in Treatment: 2 Vital Signs Height(in): 61 Pulse(bpm): 90 Weight(lbs): 100 Blood Pressure(mmHg): 136/86 Body Mass Index(BMI): 19 Temperature(F): 97.7 Respiratory Rate 16 (breaths/min): Photos: [N/A:N/A] Wound Location: Right Lower Leg - Anterior Left Lower Leg - Anterior N/A Wounding Event: Trauma Trauma N/A Primary Etiology: Trauma, Other Trauma, Other N/A Comorbid History: Congestive Heart Failure, Congestive Heart Failure, N/A Coronary Artery  Disease, End Coronary Artery Disease, End Stage Renal Disease Stage Renal Disease Date Acquired: 12/26/2017 11/17/2017 N/A Weeks of Treatment: 2 2 N/A Wound Status: Open Open N/A Measurements L x W x D 1.6x1x0.1 1.1x1x0.3 N/A (cm) Area (cm) : 1.257 0.864 N/A Volume (cm) : 0.126 0.259 N/A % Reduction in Area: 51.50% -37.60% N/A % Reduction in Volume: 51.40% -311.10% N/A Classification: Full Thickness Without Full Thickness Without N/A Exposed Support Structures Exposed Support Structures Exudate Amount: Small Small N/A Exudate Type: Serous Serous N/A Exudate Color: amber amber N/A Wound Margin: Flat and Intact Flat and Intact N/A Granulation Amount: None Present (0%) None Present (0%) N/A Necrotic Amount: Large (67-100%) Large (67-100%) N/A Necrotic Tissue: Eschar, Adherent Sudley N/A Exposed Structures: Fat Layer (Subcutaneous Fat Layer (Subcutaneous N/A Tissue) Exposed: Yes Tissue) Exposed: Yes Fascia: No Fascia: No Tendon: No Tendon: No Muscle: No Muscle: No Shammas, Talma C. ([id number]) Joint: No Joint: No Bone: No Bone: No Periwound Skin Texture: Excoriation: No Excoriation: No  N/A Induration: No Induration: No Callus: No Callus: No Crepitus: No Crepitus: No Rash: No Rash: No Scarring: No Scarring: No Periwound Skin Moisture: Maceration: No Maceration: No N/A Dry/Scaly: No Dry/Scaly: No Periwound Skin Color: Hemosiderin Staining: Yes Hemosiderin Staining: Yes N/A Atrophie Blanche: No Atrophie Blanche: No Cyanosis: No Cyanosis: No Ecchymosis: No Ecchymosis: No Erythema: No Erythema: No Mottled: No Mottled: No Pallor: No Pallor: No Rubor: No Rubor: No Tenderness on Palpation: Yes Yes N/A Wound Preparation: Ulcer Cleansing: Ulcer Cleansing: N/A Rinsed/Irrigated with Saline Rinsed/Irrigated with Saline Topical Anesthetic Applied: Topical Anesthetic Applied: Other: lidocaine 4% Other: lidocaine 4% Treatment Notes Electronic Signature(s) Signed: 01/31/2018 3:43:47 PM By: Montey Hora Entered By: Montey Hora on 01/31/2018 11:15:17 Wilfong, Christinna C. ([id number]) -------------------------------------------------------------------------------- [place] Details Patient Name: Hickson, Tashiana C. Date of Service: 01/31/2018 11:00 AM Medical Record Number: [id number] Patient Account Number: [id number] Date of Birth/Sex: 03-18-1931 (86 y.o. F) Treating RN: Montey Hora Primary Care Estel Tonelli: Harrel Lemon Other Clinician: Referring Donnamarie Shankles: Harrel Lemon Treating Lachandra Dettmann/Extender: Melburn Hake, HOYT Weeks in Treatment: 2 Active Inactive Abuse / Safety / Falls / Self Care Management Nursing Diagnoses: Potential for falls Goals: Patient will not experience any injury related to falls Date Initiated: 01/17/2018 Target Resolution Date: 04/12/2018 Goal Status: Active Interventions: Assess fall risk on admission and as needed Notes: Orientation to the Wound Care Program Nursing Diagnoses: Knowledge deficit related to the wound healing center program Goals: Patient/caregiver will verbalize understanding of the [facility] Program Date Initiated: 01/17/2018 Target Resolution Date: 04/12/2018 Goal Status: Active Interventions: Provide education on orientation to the wound center Notes: Wound/Skin Impairment Nursing Diagnoses: Impaired tissue integrity Goals: Ulcer/skin breakdown will heal within 14 weeks Date Initiated: 01/17/2018 Target Resolution Date: 04/12/2018 Goal Status: Active Interventions: Assess patient/caregiver ability to obtain necessary supplies Devenport, Tamalyn C. ([id number]) Assess patient/caregiver ability to perform ulcer/skin care regimen upon admission and as needed Assess ulceration(s) every visit Notes: Electronic Signature(s) Signed: 01/31/2018 3:43:47 PM By: Montey Hora Entered By: Montey Hora on 01/31/2018 11:15:10 Dileo, Arayah C. ([id number]) -------------------------------------------------------------------------------- Pain Assessment Details Patient Name: Herne, Teiara C. Date of Service: 01/31/2018 11:00 AM Medical Record Number: [id number] Patient Account Number: [id number] Date of Birth/Sex: January 28, 1931 (86 y.o. F) Treating RN: Montey Hora Primary Care Naleigha Raimondi: Harrel Lemon Other Clinician: Referring Starletta Houchin: Harrel Lemon Treating Winnona Wargo/Extender: Melburn Hake, HOYT Weeks in Treatment: 2 Active Problems Location of Pain Severity and Description of Pain Patient Has Paino No Site Locations Pain Management and Medication Current Pain  Management: Electronic Signature(s) Signed: 01/31/2018 3:43:47 PM By: Montey Hora Signed: 01/31/2018 3:57:43 PM By: Lorine Bears RCP, RRT, CHT Entered By: Lorine Bears on 01/31/2018 10:46:43 Dotter, Raney Loletha Grayer ([id number]) -------------------------------------------------------------------------------- Patient/Caregiver Education Details Patient Name: Capozzi, Samaira C. Date of Service: 01/31/2018 11:00 AM Medical Record Number: [id number] Patient Account Number: [id number] Date of Birth/Gender:  1931/12/08 (85 y.o. F) Treating RN: Montey Hora Primary Care Physician: Harrel Lemon Other Clinician: Referring Physician: Harrel Lemon Treating Physician/Extender: Sharalyn Ink in Treatment: 2 Education Assessment Education Provided To: Patient Education Topics Provided Wound/Skin Impairment: Handouts: Other: continue wound care as ordered Methods: Demonstration, Explain/Verbal Responses: State content correctly Electronic Signature(s) Signed: 01/31/2018 3:43:47 PM By: Montey Hora Entered By: Montey Hora on 01/31/2018 11:23:51 Landeck, Angelette C. ([id number]) -------------------------------------------------------------------------------- Wound Assessment Details Patient Name: Kasik, Nyelah C. Date of Service: 01/31/2018 11:00 AM Medical Record Number: [id number] Patient Account Number: [id number] Date of Birth/Sex: [birth year] (86 y.o. F) Treating RN: Secundino Ginger Primary Care Bralon Antkowiak: Harrel Lemon Other Clinician: Referring Luria Rosario: Harrel Lemon Treating Claudius Mich/Extender: Melburn Hake, HOYT Weeks in Treatment: 2 Wound Status Wound Number: 1 Primary Trauma, Other Etiology: Wound Location: Right Lower Leg - Anterior Wound Open Wounding Event: Trauma Status: Date Acquired: 12/26/2017 Comorbid Congestive Heart Failure, Coronary Artery Weeks Of Treatment: 2 History: Disease, End Stage Renal Disease Clustered Wound: No Photos Wound Measurements Length: (cm) 1.6 Width: (cm) 1 Depth: (cm) 0.4 Area: (cm) 1.257 Volume: (cm) 0.503 % Reduction in Area: 51.5% % Reduction in Volume: -94.2% Tunneling: No Undermining: Yes Starting Position (o'clock): 11 Ending Position (o'clock): 3 Maximum Distance: (cm) 0.3 Wound Description Full Thickness Without Exposed Support Classification: Structures Wound Margin: Flat and Intact Exudate Medium Amount: Exudate Type: Serous Exudate Color: amber Foul Odor After Cleansing: No Slough/Fibrino No Wound  Bed Granulation Amount: Medium (34-66%) Exposed Structure Granulation Quality: Red Fascia Exposed: No Necrotic Amount: Medium (34-66%) Fat Layer (Subcutaneous Tissue) Exposed: Yes Necrotic Quality: Adherent Slough Tendon Exposed: No Muscle Exposed: No Vita, Caitlynn C. ([id number]) Joint Exposed: No Bone Exposed: No Periwound Skin Texture Texture Color No Abnormalities Noted: No No Abnormalities Noted: No Callus: No Atrophie Blanche: No Crepitus: No Cyanosis: No Excoriation: No Ecchymosis: No Induration: No Erythema: No Rash: No Hemosiderin Staining: Yes Scarring: No Mottled: No Pallor: No Moisture Rubor: No No Abnormalities Noted: No Dry / Scaly: No Temperature / Pain Maceration: No Tenderness on Palpation: Yes Wound Preparation Ulcer Cleansing: Rinsed/Irrigated with Saline Topical Anesthetic Applied: Other: lidocaine 4%, Treatment Notes Wound #1 (Right, Anterior Lower Leg) Notes prisma, non adherent pad and tegaderm Electronic Signature(s) Signed: 01/31/2018 11:33:51 AM By: Montey Hora Signed: 01/31/2018 3:23:26 PM By: Secundino Ginger Entered By: Montey Hora on 01/31/2018 11:33:50 Foree, Donnelle C. ([id number]) -------------------------------------------------------------------------------- Wound Assessment Details Patient Name: Gledhill, Cinda C. Date of Service: 01/31/2018 11:00 AM Medical Record Number: [id number] Patient Account Number: [id number] Date of Birth/Sex: 08-Sep-1931 (86 y.o. F) Treating RN: Secundino Ginger Primary Care Ihor Meinzer: Harrel Lemon Other Clinician: Referring Ayasha Ellingsen: Harrel Lemon Treating Claretta Kendra/Extender: Melburn Hake, HOYT Weeks in Treatment: 2 Wound Status Wound Number: 2 Primary Trauma, Other Etiology: Wound Location: Left Lower Leg - Anterior Wound Open Wounding Event: Trauma Status: Date Acquired: 11/17/2017 Comorbid Congestive Heart Failure, Coronary Artery Weeks Of Treatment: 2 History: Disease, End Stage Renal Disease Clustered  Wound: No Photos Wound Measurements Length: (cm) 1.1 Width: (cm) 1 Depth: (cm) 0.3 Area: (cm) 0.864 Volume: (cm) 0.259 % Reduction in Area: -37.6% % Reduction in Volume: -311.1% Epithelialization: None Tunneling: No Undermining: No Wound  Description Full Thickness Without Exposed Support Classification: Structures Wound Margin: Flat and Intact Exudate Medium Amount: Exudate Type: Serous Exudate Color: amber Foul Odor After Cleansing: No Slough/Fibrino No Wound Bed Granulation Amount: Medium (34-66%) Exposed Structure Granulation Quality: Red Fascia Exposed: No Necrotic Amount: Medium (34-66%) Fat Layer (Subcutaneous Tissue) Exposed: Yes Necrotic Quality: Adherent Slough Tendon Exposed: No Muscle Exposed: No Joint Exposed: No Bone Exposed: No Periwound Skin Texture Merten, Briyonna C. ([id number]) Texture Color No Abnormalities Noted: No No Abnormalities Noted: No Callus: No Atrophie Blanche: No Crepitus: No Cyanosis: No Excoriation: No Ecchymosis: No Induration: No Erythema: No Rash: No Hemosiderin Staining: Yes Scarring: No Mottled: No Pallor: No Moisture Rubor: No No Abnormalities Noted: No Dry / Scaly: No Temperature / Pain Maceration: No Tenderness on Palpation: Yes Wound Preparation Ulcer Cleansing: Rinsed/Irrigated with Saline Topical Anesthetic Applied: Other: lidocaine 4%, Treatment Notes Wound #2 (Left, Anterior Lower Leg) Notes prisma, non adherent pad and tegaderm Electronic Signature(s) Signed: 01/31/2018 11:34:09 AM By: Montey Hora Signed: 01/31/2018 3:23:26 PM By: Secundino Ginger Entered By: Montey Hora on 01/31/2018 11:34:09 Serio, Reatha C. ([id number]) -------------------------------------------------------------------------------- [place] Details Patient Name: Brossman, Adalae C. Date of Service: 01/31/2018 11:00 AM Medical Record Number: [id number] Patient Account Number: [id number] Date of Birth/Sex: September 24, 1931 (86 y.o. F) Treating  RN: Montey Hora Primary Care Cason Dabney: Harrel Lemon Other Clinician: Referring Hayze Gazda: Harrel Lemon Treating Ramey Ketcherside/Extender: Melburn Hake, HOYT Weeks in Treatment: 2 Vital Signs Time Taken: 10:46 Temperature (F): 97.7 Height (in): 61 Pulse (bpm): 90 Weight (lbs): 100 Respiratory Rate (breaths/min): 16 Body Mass Index (BMI): 18.9 Blood Pressure (mmHg): 136/86 Reference Range: 80 - 120 mg / dl Airway Electronic Signature(s) Signed: 01/31/2018 3:57:43 PM By: Lorine Bears RCP, RRT, CHT Entered By: Lorine Bears on 01/31/2018 10:50:18

## 2018-02-03 DIAGNOSIS — E538 Deficiency of other specified B group vitamins: Secondary | ICD-10-CM | POA: Diagnosis not present

## 2018-02-03 DIAGNOSIS — I480 Paroxysmal atrial fibrillation: Secondary | ICD-10-CM | POA: Diagnosis not present

## 2018-02-03 DIAGNOSIS — N183 Chronic kidney disease, stage 3 (moderate): Secondary | ICD-10-CM | POA: Diagnosis not present

## 2018-02-03 DIAGNOSIS — I341 Nonrheumatic mitral (valve) prolapse: Secondary | ICD-10-CM | POA: Diagnosis not present

## 2018-02-03 DIAGNOSIS — I340 Nonrheumatic mitral (valve) insufficiency: Secondary | ICD-10-CM | POA: Diagnosis not present

## 2018-02-03 DIAGNOSIS — Z9889 Other specified postprocedural states: Secondary | ICD-10-CM | POA: Diagnosis not present

## 2018-02-03 DIAGNOSIS — I2510 Atherosclerotic heart disease of native coronary artery without angina pectoris: Secondary | ICD-10-CM | POA: Diagnosis not present

## 2018-02-03 DIAGNOSIS — I361 Nonrheumatic tricuspid (valve) insufficiency: Secondary | ICD-10-CM | POA: Diagnosis not present

## 2018-02-03 DIAGNOSIS — R0602 Shortness of breath: Secondary | ICD-10-CM | POA: Diagnosis not present

## 2018-02-03 DIAGNOSIS — R079 Chest pain, unspecified: Secondary | ICD-10-CM | POA: Diagnosis not present

## 2018-02-03 DIAGNOSIS — I493 Ventricular premature depolarization: Secondary | ICD-10-CM | POA: Diagnosis not present

## 2018-02-03 DIAGNOSIS — E785 Hyperlipidemia, unspecified: Secondary | ICD-10-CM | POA: Diagnosis not present

## 2018-02-03 DIAGNOSIS — I351 Nonrheumatic aortic (valve) insufficiency: Secondary | ICD-10-CM | POA: Diagnosis not present

## 2018-02-06 ENCOUNTER — Ambulatory Visit: Payer: PPO | Admitting: Physician Assistant

## 2018-02-07 ENCOUNTER — Encounter: Payer: PPO | Admitting: Physician Assistant

## 2018-02-07 DIAGNOSIS — L97812 Non-pressure chronic ulcer of other part of right lower leg with fat layer exposed: Secondary | ICD-10-CM | POA: Diagnosis not present

## 2018-02-07 DIAGNOSIS — L97822 Non-pressure chronic ulcer of other part of left lower leg with fat layer exposed: Secondary | ICD-10-CM | POA: Diagnosis not present

## 2018-02-09 NOTE — Progress Notes (Signed)
CLARINDA, OBI ([id number]) Visit Report for 02/07/2018 Arrival Information Details Patient Name: Elizabeth Fields, [name] Date of Service: 02/07/2018 9:45 AM Medical Record Number: [id number] Patient Account Number: [id number] Date of Birth/Sex: 12-02-1931 (86 y.o. F) Treating RN: Secundino Ginger Primary Care Amberle Lyter: Harrel Lemon Other Clinician: Referring Andersen Iorio: Harrel Lemon Treating Marlita Keil/Extender: Melburn Hake, HOYT Weeks in Treatment: 3 Visit Information History Since Last Visit Added or deleted any medications: No Patient Arrived: Ambulatory Any new allergies or adverse reactions: No Arrival Time: 09:49 Had a fall or experienced change in No Accompanied By: self activities of daily living that may affect Transfer Assistance: None risk of falls: Patient Identification Verified: Yes Signs or symptoms of abuse/neglect since last visito No Secondary Verification Process Completed: Yes Hospitalized since last visit: No Implantable device outside of the clinic excluding No cellular tissue based products placed in the center since last visit: Has Dressing in Place as Prescribed: Yes Pain Present Now: No Electronic Signature(s) Signed: 02/07/2018 11:55:35 AM By: Secundino Ginger Entered By: Secundino Ginger on 02/07/2018 09:50:06 Elizabeth Fields, [name] ([id number]) -------------------------------------------------------------------------------- Encounter Discharge Information Details Patient Name: [name], [name] Date of Service: 02/07/2018 9:45 AM Medical Record Number: [id number] Patient Account Number: [id number] Date of Birth/Sex: September 18, 1931 (86 y.o. F) Treating RN: Montey Hora Primary Care Andris Brothers: Harrel Lemon Other Clinician: Referring Braidan Ricciardi: Harrel Lemon Treating Nahjae Hoeg/Extender: Melburn Hake, HOYT Weeks in Treatment: 3 Encounter Discharge Information Items Post Procedure Vitals Discharge Condition: Stable Temperature (F): 97.9 Ambulatory Status: Ambulatory Pulse (bpm): 63 Discharge  Destination: Home Respiratory Rate (breaths/min): 16 Transportation: Private Auto Blood Pressure (mmHg): 124/53 Accompanied By: self Schedule Follow-up Appointment: Yes Clinical Summary of Care: Electronic Signature(s) Signed: 02/07/2018 5:16:27 PM By: Montey Hora Entered By: Montey Hora on 02/07/2018 10:35:08 Elizabeth Fields, [name] ([id number]) -------------------------------------------------------------------------------- Lower Extremity Assessment Details Patient Name: [name], [name] Date of Service: 02/07/2018 9:45 AM Medical Record Number: [id number] Patient Account Number: [id number] Date of Birth/Sex: 1931/02/12 (86 y.o. F) Treating RN: Secundino Ginger Primary Care Karri Kallenbach: Harrel Lemon Other Clinician: Referring Lizzet Hendley: Harrel Lemon Treating Tawnia Schirm/Extender: Melburn Hake, HOYT Weeks in Treatment: 3 Edema Assessment Assessed: [Left: No] [Right: No] Edema: [Left: No] [Right: No] Calf Left: Right: Point of Measurement: 26 cm From Medial Instep 30 cm 30 cm Ankle Left: Right: Point of Measurement: 11 cm From Medial Instep 20 cm 19 cm Vascular Assessment Claudication: Claudication Assessment [Left:None] [Right:None] Pulses: Dorsalis Pedis Palpable: [Left:Yes] [Right:Yes] Posterior Tibial Extremity colors, hair growth, and conditions: Extremity Color: [Left:Hyperpigmented] [Right:Hyperpigmented] Hair Growth on Extremity: [Left:No] [Right:No] Temperature of Extremity: [Right:Warm] Capillary Refill: [Left:< 3 seconds] [Right:< 3 seconds] Toe Nail Assessment Left: Right: Thick: No No Discolored: No No Deformed: No No Improper Length and Hygiene: No No Electronic Signature(s) Signed: 02/07/2018 11:55:35 AM By: Secundino Ginger Entered By: Secundino Ginger on 02/07/2018 10:00:40 Elizabeth Fields, [name] ([id number]) -------------------------------------------------------------------------------- Multi Wound Chart Details Patient Name: [name], [name] Date of Service: 02/07/2018 9:45 AM Medical  Record Number: [id number] Patient Account Number: [id number] Date of Birth/Sex: [birth year] (86 y.o. F) Treating RN: Montey Hora Primary Care Nolan Tuazon: Harrel Lemon Other Clinician: Referring Fong Mccarry: Harrel Lemon Treating Pheonix Wisby/Extender: Melburn Hake, HOYT Weeks in Treatment: 3 Vital Signs Height(in): 61 Pulse(bpm): 66 Weight(lbs): 100 Blood Pressure(mmHg): 124/55 Body Mass Index(BMI): 19 Temperature(F): 97.9 Respiratory Rate 18 (breaths/min): Photos: [N/A:N/A] Wound Location: Right Lower Leg - Anterior Left Lower Leg - Anterior N/A Wounding Event: Trauma Trauma N/A Primary Etiology: Trauma, Other Trauma, Other N/A Comorbid History: Congestive Heart Failure, Congestive Heart Failure, N/A Coronary Artery Disease, End  Coronary Artery Disease, End Stage Renal Disease Stage Renal Disease Date Acquired: 12/26/2017 11/17/2017 N/A Weeks of Treatment: 3 3 N/A Wound Status: Open Open N/A Measurements L x W x D 1.2x1x0.1 1x1x0.2 N/A (cm) Area (cm) : 0.942 0.785 N/A Volume (cm) : 0.094 0.157 N/A % Reduction in Area: 63.70% -25.00% N/A % Reduction in Volume: 63.70% -149.20% N/A Classification: Full Thickness Without Full Thickness Without N/A Exposed Support Structures Exposed Support Structures Exudate Amount: Small Small N/A Exudate Type: Serous Serous N/A Exudate Color: amber amber N/A Wound Margin: Flat and Intact Flat and Intact N/A Granulation Amount: None Present (0%) None Present (0%) N/A Necrotic Amount: Large (67-100%) Large (67-100%) N/A Exposed Structures: Fat Layer (Subcutaneous Fat Layer (Subcutaneous N/A Tissue) Exposed: Yes Tissue) Exposed: Yes Fascia: No Fascia: No Tendon: No Tendon: No Muscle: No Muscle: No Elizabeth Fields, [name] ([id number]) Joint: No Joint: No Bone: No Bone: No Epithelialization: N/A None N/A Periwound Skin Texture: Excoriation: No Excoriation: No N/A Induration: No Induration: No Callus: No Callus: No Crepitus: No Crepitus:  No Rash: No Rash: No Scarring: No Scarring: No Periwound Skin Moisture: Maceration: No Maceration: No N/A Dry/Scaly: No Dry/Scaly: No Periwound Skin Color: Hemosiderin Staining: Yes Hemosiderin Staining: Yes N/A Atrophie Blanche: No Atrophie Blanche: No Cyanosis: No Cyanosis: No Ecchymosis: No Ecchymosis: No Erythema: No Erythema: No Mottled: No Mottled: No Pallor: No Pallor: No Rubor: No Rubor: No Tenderness on Palpation: Yes Yes N/A Wound Preparation: Ulcer Cleansing: Ulcer Cleansing: N/A Rinsed/Irrigated with Saline Rinsed/Irrigated with Saline Topical Anesthetic Applied: Topical Anesthetic Applied: Other: lidocaine 4% Other: lidocaine 4% Treatment Notes Electronic Signature(s) Signed: 02/07/2018 5:16:27 PM By: Montey Hora Entered By: Montey Hora on 02/07/2018 10:21:51 Elizabeth Fields, [name] ([id number]) -------------------------------------------------------------------------------- [place] Details Patient Name: Bronkema, Zahraa C. Date of Service: 02/07/2018 9:45 AM Medical Record Number: [id number] Patient Account Number: [id number] Date of Birth/Sex: February 12, 1931 (86 y.o. F) Treating RN: Montey Hora Primary Care Chancellor Vanderloop: Harrel Lemon Other Clinician: Referring Khelani Kops: Harrel Lemon Treating Marwa Fuhrman/Extender: Melburn Hake, HOYT Weeks in Treatment: 3 Active Inactive Abuse / Safety / Falls / Self Care Management Nursing Diagnoses: Potential for falls Goals: Patient will not experience any injury related to falls Date Initiated: 01/17/2018 Target Resolution Date: 04/12/2018 Goal Status: Active Interventions: Assess fall risk on admission and as needed Notes: Orientation to the Wound Care Program Nursing Diagnoses: Knowledge deficit related to the wound healing center program Goals: Patient/caregiver will verbalize understanding of the [place] Program Date Initiated: 01/17/2018 Target Resolution Date: 04/12/2018 Goal Status:  Active Interventions: Provide education on orientation to the wound center Notes: Wound/Skin Impairment Nursing Diagnoses: Impaired tissue integrity Goals: Ulcer/skin breakdown will heal within 14 weeks Date Initiated: 01/17/2018 Target Resolution Date: 04/12/2018 Goal Status: Active Interventions: Assess patient/caregiver ability to obtain necessary supplies Augenstein, Trinna C. ([id number]) Assess patient/caregiver ability to perform ulcer/skin care regimen upon admission and as needed Assess ulceration(s) every visit Notes: Electronic Signature(s) Signed: 02/07/2018 5:16:27 PM By: Montey Hora Entered By: Montey Hora on 02/07/2018 10:20:50 Elizabeth Fields, [name] ([id number]) -------------------------------------------------------------------------------- Pain Assessment Details Patient Name: Elizabeth Fields, [name] Date of Service: 02/07/2018 9:45 AM Medical Record Number: [id number] Patient Account Number: [id number] Date of Birth/Sex: 01-Oct-1931 (86 y.o. F) Treating RN: Secundino Ginger Primary Care Zarielle Cea: Harrel Lemon Other Clinician: Referring Merisa Julio: Harrel Lemon Treating Gunnard Dorrance/Extender: Melburn Hake, HOYT Weeks in Treatment: 3 Active Problems Location of Pain Severity and Description of Pain Patient Has Paino No Site Locations Pain Management and Medication Current Pain Management: Notes pt denies any pain  at this time. Electronic Signature(s) Signed: 02/07/2018 11:55:35 AM By: Secundino Ginger Entered By: Secundino Ginger on 02/07/2018 09:51:14 [place]field, Tanna Savoy ([id number]) -------------------------------------------------------------------------------- Patient/Caregiver Education Details Patient Name: Elizabeth Fields, [name] Date of Service: 02/07/2018 9:45 AM Medical Record Number: [id number] Patient Account Number: [id number] Date of Birth/Gender: 1931-08-24 (85 y.o. F) Treating RN: Montey Hora Primary Care Physician: Harrel Lemon Other Clinician: Referring Physician: Harrel Lemon Treating  Physician/Extender: Sharalyn Ink in Treatment: 3 Education Assessment Education Provided To: Patient Education Topics Provided Wound/Skin Impairment: Handouts: Other: wound care as ordered Methods: Demonstration, Explain/Verbal Responses: State content correctly Electronic Signature(s) Signed: 02/07/2018 5:16:27 PM By: Montey Hora Entered By: Montey Hora on 02/07/2018 10:35:23 Elizabeth Fields, [name] ([id number]) -------------------------------------------------------------------------------- Wound Assessment Details Patient Name: Elizabeth Fields, [name] Date of Service: 02/07/2018 9:45 AM Medical Record Number: [id number] Patient Account Number: [id number] Date of Birth/Sex: 12/17/1931 (86 y.o. F) Treating RN: Secundino Ginger Primary Care Geovany Trudo: Harrel Lemon Other Clinician: Referring Brianah Hopson: Harrel Lemon Treating Murriel Holwerda/Extender: Melburn Hake, HOYT Weeks in Treatment: 3 Wound Status Wound Number: 1 Primary Trauma, Other Etiology: Wound Location: Right Lower Leg - Anterior Wound Open Wounding Event: Trauma Status: Date Acquired: 12/26/2017 Comorbid Congestive Heart Failure, Coronary Artery Weeks Of Treatment: 3 History: Disease, End Stage Renal Disease Clustered Wound: No Photos Photo Uploaded By: Secundino Ginger on 02/07/2018 10:05:30 Wound Measurements Length: (cm) 1.2 Width: (cm) 1 Depth: (cm) 0.1 Area: (cm) 0.942 Volume: (cm) 0.094 % Reduction in Area: 63.7% % Reduction in Volume: 63.7% Tunneling: No Undermining: No Wound Description Full Thickness Without Exposed Support Foul Od Classification: Structures Slough/ Wound Margin: Flat and Intact Exudate Small Amount: Exudate Type: Serous Exudate Color: amber or After Cleansing: No Fibrino No Wound Bed Granulation Amount: None Present (0%) Exposed Structure Necrotic Amount: Large (67-100%) Fascia Exposed: No Necrotic Quality: Adherent Slough Fat Layer (Subcutaneous Tissue) Exposed: Yes Tendon Exposed:  No Muscle Exposed: No Joint Exposed: No Bone Exposed: No Stokes, Danisa C. ([id number]) Periwound Skin Texture Texture Color No Abnormalities Noted: No No Abnormalities Noted: No Callus: No Atrophie Blanche: No Crepitus: No Cyanosis: No Excoriation: No Ecchymosis: No Induration: No Erythema: No Rash: No Hemosiderin Staining: Yes Scarring: No Mottled: No Pallor: No Moisture Rubor: No No Abnormalities Noted: No Dry / Scaly: No Temperature / Pain Maceration: No Tenderness on Palpation: Yes Wound Preparation Ulcer Cleansing: Rinsed/Irrigated with Saline Topical Anesthetic Applied: Other: lidocaine 4%, Treatment Notes Wound #1 (Right, Anterior Lower Leg) Notes hydrafera blue, non adherent pad and tegaderm Electronic Signature(s) Signed: 02/07/2018 11:55:35 AM By: Secundino Ginger Entered By: Secundino Ginger on 02/07/2018 09:57:21 Elizabeth Fields, [name] ([id number]) -------------------------------------------------------------------------------- Wound Assessment Details Patient Name: Loper, Lorrie C. Date of Service: 02/07/2018 9:45 AM Medical Record Number: [id number] Patient Account Number: [id number] Date of Birth/Sex: 1931/09/07 (86 y.o. F) Treating RN: Secundino Ginger Primary Care Elery Cadenhead: Harrel Lemon Other Clinician: Referring Brelan Hannen: Harrel Lemon Treating Lucielle Vokes/Extender: Melburn Hake, HOYT Weeks in Treatment: 3 Wound Status Wound Number: 2 Primary Trauma, Other Etiology: Wound Location: Left Lower Leg - Anterior Wound Open Wounding Event: Trauma Status: Date Acquired: 11/17/2017 Comorbid Congestive Heart Failure, Coronary Artery Weeks Of Treatment: 3 History: Disease, End Stage Renal Disease Clustered Wound: No Photos Photo Uploaded By: Secundino Ginger on 02/07/2018 10:05:55 Wound Measurements Length: (cm) 1 Width: (cm) 1 Depth: (cm) 0.2 Area: (cm) 0.785 Volume: (cm) 0.157 % Reduction in Area: -25% % Reduction in Volume: -149.2% Epithelialization: None Tunneling:  No Undermining: No Wound Description Full Thickness Without Exposed Support Classification: Structures Wound Margin: Flat and Intact  Exudate Small Amount: Exudate Type: Serous Exudate Color: amber Foul Odor After Cleansing: No Slough/Fibrino Yes Wound Bed Granulation Amount: None Present (0%) Exposed Structure Necrotic Amount: Large (67-100%) Fascia Exposed: No Necrotic Quality: Adherent Slough Fat Layer (Subcutaneous Tissue) Exposed: Yes Tendon Exposed: No Muscle Exposed: No Joint Exposed: No Bone Exposed: No Kham, Kimerly C. ([id number]) Periwound Skin Texture Texture Color No Abnormalities Noted: No No Abnormalities Noted: No Callus: No Atrophie Blanche: No Crepitus: No Cyanosis: No Excoriation: No Ecchymosis: No Induration: No Erythema: No Rash: No Hemosiderin Staining: Yes Scarring: No Mottled: No Pallor: No Moisture Rubor: No No Abnormalities Noted: No Dry / Scaly: No Temperature / Pain Maceration: No Tenderness on Palpation: Yes Wound Preparation Ulcer Cleansing: Rinsed/Irrigated with Saline Topical Anesthetic Applied: Other: lidocaine 4%, Treatment Notes Wound #2 (Left, Anterior Lower Leg) Notes hydrafera blue, non adherent pad and tegaderm Electronic Signature(s) Signed: 02/07/2018 11:55:35 AM By: Secundino Ginger Entered By: Secundino Ginger on 02/07/2018 09:58:26 Fifita, Joud C. ([id number]) -------------------------------------------------------------------------------- [place] Details Patient Name: Grzesiak, Maddeline C. Date of Service: 02/07/2018 9:45 AM Medical Record Number: [id number] Patient Account Number: [id number] Date of Birth/Sex: 27-Feb-1931 (86 y.o. F) Treating RN: Secundino Ginger Primary Care Bitania Shankland: Harrel Lemon Other Clinician: Referring Tristan Proto: Harrel Lemon Treating Edis Huish/Extender: Melburn Hake, HOYT Weeks in Treatment: 3 Vital Signs Time Taken: 09:45 Temperature (F): 97.9 Height (in): 61 Pulse (bpm): 63 Weight (lbs): 100 Respiratory Rate  (breaths/min): 18 Body Mass Index (BMI): 18.9 Blood Pressure (mmHg): 124/55 Reference Range: 80 - 120 mg / dl Airway Electronic Signature(s) Signed: 02/07/2018 11:55:35 AM By: Secundino Ginger Entered BySecundino Ginger on 02/07/2018 09:53:23

## 2018-02-09 NOTE — Progress Notes (Signed)
CHRISHAWN, KRING ([id number]) Visit Report for 02/07/2018 Chief Complaint Document Details Patient Name: Rallo, [name] Date of Service: 02/07/2018 9:45 AM Medical Record Number: [id number] Patient Account Number: [id number] Date of Birth/Sex: 11-03-1931 (86 y.o. F) Treating RN: Montey Hora Primary Care Provider: Harrel Lemon Other Clinician: Referring Provider: Harrel Lemon Treating Provider/Extender: Melburn Hake, Nima Kemppainen Weeks in Treatment: 3 Information Obtained from: Patient Chief Complaint Bilateral LE Ulcers Electronic Signature(s) Signed: 02/07/2018 5:12:31 PM By: Worthy Keeler PA-C Entered By: Worthy Keeler on 02/07/2018 10:02:03 Overbay, Alexzandrea C. ([id number]) -------------------------------------------------------------------------------- Debridement Details Patient Name: Servidio, [name] Date of Service: 02/07/2018 9:45 AM Medical Record Number: [id number] Patient Account Number: [id number] Date of Birth/Sex: 1931/04/02 (86 y.o. F) Treating RN: Montey Hora Primary Care Provider: Harrel Lemon Other Clinician: Referring Provider: Harrel Lemon Treating Provider/Extender: Melburn Hake, Malloree Raboin Weeks in Treatment: 3 Debridement Performed for Wound #2 Left,Anterior Lower Leg Assessment: Performed By: Physician STONE III, Idelle Reimann E., PA-C Debridement Type: Debridement Level of Consciousness (Pre- Awake and Alert procedure): Pre-procedure Verification/Time Yes - 10:31 Out Taken: Start Time: 10:31 Pain Control: Lidocaine 4% Topical Solution Total Area Debrided (L x W): 1 (cm) x 1 (cm) = 1 (cm) Tissue and other material Viable, Non-Viable, Slough, Subcutaneous, Slough debrided: Level: Skin/Subcutaneous Tissue Debridement Description: Excisional Instrument: Curette Bleeding: Minimum Hemostasis Achieved: Pressure End Time: 10:33 Procedural Pain: 0 Post Procedural Pain: 0 Response to Treatment: Procedure was tolerated well Level of Consciousness Awake and  Alert (Post-procedure): Post Debridement Measurements of Total Wound Length: (cm) 1 Width: (cm) 1 Depth: (cm) 0.2 Volume: (cm) 0.157 Character of Wound/Ulcer Post Debridement: Improved Post Procedure Diagnosis Same as Pre-procedure Electronic Signature(s) Signed: 02/07/2018 5:12:31 PM By: Worthy Keeler PA-C Signed: 02/07/2018 5:16:27 PM By: Montey Hora Entered By: Montey Hora on 02/07/2018 10:33:31 Clock, Alycen C. ([id number]) -------------------------------------------------------------------------------- Debridement Details Patient Name: Tomkiewicz, [name] Date of Service: 02/07/2018 9:45 AM Medical Record Number: [id number] Patient Account Number: [id number] Date of Birth/Sex: 10-04-1931 (86 y.o. F) Treating RN: Montey Hora Primary Care Provider: Harrel Lemon Other Clinician: Referring Provider: Harrel Lemon Treating Provider/Extender: Melburn Hake, Coree Riester Weeks in Treatment: 3 Debridement Performed for Wound #1 Right,Anterior Lower Leg Assessment: Performed By: Physician STONE III, Doninique Lwin E., PA-C Debridement Type: Debridement Level of Consciousness (Pre- Awake and Alert procedure): Pre-procedure Verification/Time Yes - 10:24 Out Taken: Start Time: 10:24 Pain Control: Lidocaine 4% Topical Solution Total Area Debrided (L x W): 1.2 (cm) x 1 (cm) = 1.2 (cm) Tissue and other material Viable, Non-Viable, Slough, Subcutaneous, Slough debrided: Level: Skin/Subcutaneous Tissue Debridement Description: Excisional Instrument: Curette Bleeding: Minimum Hemostasis Achieved: Pressure End Time: 10:30 Procedural Pain: 0 Post Procedural Pain: 0 Response to Treatment: Procedure was tolerated well Level of Consciousness Awake and Alert (Post-procedure): Post Debridement Measurements of Total Wound Length: (cm) 1.2 Width: (cm) 1 Depth: (cm) 0.4 Volume: (cm) 0.377 Character of Wound/Ulcer Post Debridement: Improved Post Procedure Diagnosis Same as  Pre-procedure Electronic Signature(s) Signed: 02/07/2018 5:12:31 PM By: Worthy Keeler PA-C Signed: 02/07/2018 5:16:27 PM By: Montey Hora Entered By: Montey Hora on 02/07/2018 10:33:42 Buckwalter, Poetry C. ([id number]) -------------------------------------------------------------------------------- HPI Details Patient Name: Keagle, [name] Date of Service: 02/07/2018 9:45 AM Medical Record Number: [id number] Patient Account Number: [id number] Date of Birth/Sex: 05-Dec-1931 (86 y.o. F) Treating RN: Montey Hora Primary Care Provider: Harrel Lemon Other Clinician: Referring Provider: Harrel Lemon Treating Provider/Extender: Melburn Hake, Delight Bickle Weeks in Treatment: 3 History of Present Illness HPI Description: 01/17/18 on evaluation today patient presents for evaluation of two ulcers  both on the anterior portion of her lower extremities one right and one left. The right she has had for about three weeks the left for about eight weeks and both occurred as a result of her traumatizing her shins on the bottom of the card were trying to get out of the car. She states this is never happen with any other car doors before but she has been having some issues with this sharp area on the store in particular. Both occurred in the similar situation where she was trying not to open the door the whole way in order to avoid damaging anybody else's car beside them. She is been using peroxide initially to clean the wound although she has converted to the wound cleanser now. She has been using if anything just over the counter triple antibiotic ointment and a Band-Aid. With that being said she's not even been using that more recently. The most part she's just been attempting allow this to dry out/scab over. Her APIs were greater than 220 and noncompressible. Patient does have a history of paroxysmal atrial fibrillation for which she is on Coumadin. She also has chronic kidney disease stage III. She has been on  antibiotics that I see no evidence of infection right now which is good news. 01/24/18 on evaluation today patient appears to be doing excellent in regard to her lower extremity ulcers. She has been tolerating the dressing changes without complication which is excellent news. Overall I have been very pleased with her progress. Nonetheless she still has have some ways to go before this will be completely healed. The left is a little bit better in appearance compared to the right which is somewhat deeper. 01/31/18 on evaluation today patient appears to be doing better in regard to her bilateral anterior lower extremity ulcers. She has been tolerating the dressing changes without complication which is excellent news. Fortunately there does not appear to be any evidence of infection at this time which is also good news. I'm very pleased with how things seem to be progressing at this point. The patient likewise is very happy and states she's not having the pain that she was having previous. 02/07/18 on evaluation today patient appears to be doing rather well in regard to her ulcers. The left especially seems to be making good progress in the right does have more slough and poor granulation tissue on the surface at this point. I think this is gonna require little bit more extensive debridement. Fortunately there's no signs of infection at this time. Electronic Signature(s) Signed: 02/07/2018 5:12:31 PM By: Worthy Keeler PA-C Entered By: Worthy Keeler on 02/07/2018 10:37:24 Manetta, Tanna Savoy ([id number]) -------------------------------------------------------------------------------- Physical Exam Details Patient Name: Kimery, [name] Date of Service: 02/07/2018 9:45 AM Medical Record Number: [id number] Patient Account Number: [id number] Date of Birth/Sex: 1931-10-19 (86 y.o. F) Treating RN: Montey Hora Primary Care Provider: Harrel Lemon Other Clinician: Referring Provider: Harrel Lemon Treating  Provider/Extender: Melburn Hake, Alexus Michael Weeks in Treatment: 3 Constitutional Well-nourished and well-hydrated in no acute distress. Respiratory normal breathing without difficulty. Psychiatric this patient is able to make decisions and demonstrates good insight into disease process. Alert and Oriented x 3. pleasant and cooperative. Notes Both the patient was did require sharp debridement today this was a fairly light debridement on the left as it appears to be doing much better. On the right I did have to perform a much more extensive debridement today to clear away for granulation tissue and slough from the  surface of the wound. She tolerated this without any significant discomfort which was good news. Post debridement both wound bed's appear to be doing much better which is excellent news. Electronic Signature(s) Signed: 02/07/2018 5:12:31 PM By: Worthy Keeler PA-C Entered By: Worthy Keeler on 02/07/2018 10:37:54 Garver, Keosha Loletha Grayer ([id number]) -------------------------------------------------------------------------------- Physician Orders Details Patient Name: Trejos, Lekesha C. Date of Service: 02/07/2018 9:45 AM Medical Record Number: [id number] Patient Account Number: [id number] Date of Birth/Sex: 1931/04/06 (86 y.o. F) Treating RN: Montey Hora Primary Care Provider: Harrel Lemon Other Clinician: Referring Provider: Harrel Lemon Treating Provider/Extender: Melburn Hake, Jerrol Helmers Weeks in Treatment: 3 Verbal / Phone Orders: No Diagnosis Coding ICD-10 Coding Code Description [phone] Non-pressure chronic ulcer of other part of right lower leg with fat layer exposed L97.822 Non-pressure chronic ulcer of other part of left lower leg with fat layer exposed S81.802A Unspecified open wound, left lower leg, initial encounter S81.801A Unspecified open wound, right lower leg, initial encounter I48.0 Paroxysmal atrial fibrillation Z79.01 Long term (current) use of anticoagulants N18.3 Chronic  kidney disease, stage 3 (moderate) Wound Cleansing Wound #1 Right,Anterior Lower Leg o Clean wound with Normal Saline. o May Shower, gently pat wound dry prior to applying new dressing. Wound #2 Left,Anterior Lower Leg o Clean wound with Normal Saline. o May Shower, gently pat wound dry prior to applying new dressing. Primary Wound Dressing Wound #1 Right,Anterior Lower Leg o Hydrafera Blue Ready Transfer Wound #2 Left,Anterior Lower Leg o Hydrafera Blue Ready Transfer Secondary Dressing Wound #1 Right,Anterior Lower Leg o Tegaderm o Non-adherent pad Wound #2 Left,Anterior Lower Leg o Tegaderm o Non-adherent pad Dressing Change Frequency Wound #1 Right,Anterior Lower Leg o Change Dressing Monday, Wednesday, Friday Wound #2 Left,Anterior Lower Leg o Change Dressing Monday, Wednesday, Friday Lok, Brittne C. ([id number]) Follow-up Appointments Wound #1 Right,Anterior Lower Leg o Return Appointment in 1 week. Wound #2 Left,Anterior Lower Leg o Return Appointment in 1 week. Edema Control Wound #1 Right,Anterior Lower Leg o Elevate legs to the level of the heart and pump ankles as often as possible Wound #2 Left,Anterior Lower Leg o Elevate legs to the level of the heart and pump ankles as often as possible Electronic Signature(s) Signed: 02/07/2018 5:12:31 PM By: Worthy Keeler PA-C Signed: 02/07/2018 5:16:27 PM By: Montey Hora Entered By: Montey Hora on 02/07/2018 10:34:11 Bitterman, Joanny C. ([id number]) -------------------------------------------------------------------------------- Problem List Details Patient Name: Simmering, [name] Date of Service: 02/07/2018 9:45 AM Medical Record Number: [id number] Patient Account Number: [id number] Date of Birth/Sex: 20-Oct-1931 (86 y.o. F) Treating RN: Montey Hora Primary Care Provider: Harrel Lemon Other Clinician: Referring Provider: Harrel Lemon Treating Provider/Extender: Melburn Hake, Salley Boxley Weeks  in Treatment: 3 Active Problems ICD-10 Evaluated Encounter Code Description Active Date Today Diagnosis L97.812 Non-pressure chronic ulcer of other part of right lower leg 01/17/2018 No Yes with fat layer exposed L97.822 Non-pressure chronic ulcer of other part of left lower leg with 01/17/2018 No Yes fat layer exposed S81.802A Unspecified open wound, left lower leg, initial encounter 01/17/2018 No Yes S81.801A Unspecified open wound, right lower leg, initial encounter 01/17/2018 No Yes I48.0 Paroxysmal atrial fibrillation 01/17/2018 No Yes Z79.01 Long term (current) use of anticoagulants 01/17/2018 No Yes N18.3 Chronic kidney disease, stage 3 (moderate) 01/17/2018 No Yes Inactive Problems Resolved Problems Electronic Signature(s) Signed: 02/07/2018 5:12:31 PM By: Worthy Keeler PA-C Entered By: Worthy Keeler on 02/07/2018 10:01:59 Coble, Aleanna C. ([id number]) -------------------------------------------------------------------------------- Progress Note Details Patient Name: Elizabeth Fields, [name] Date of Service: 02/07/2018 9:45 AM Medical  Record Number: [id number] Patient Account Number: [id number] Date of Birth/Sex: [birth year] (86 y.o. F) Treating RN: Montey Hora Primary Care Provider: Harrel Lemon Other Clinician: Referring Provider: Harrel Lemon Treating Provider/Extender: Melburn Hake, Keara Pagliarulo Weeks in Treatment: 3 Subjective Chief Complaint Information obtained from Patient Bilateral LE Ulcers History of Present Illness (HPI) 01/17/18 on evaluation today patient presents for evaluation of two ulcers both on the anterior portion of her lower extremities one right and one left. The right she has had for about three weeks the left for about eight weeks and both occurred as a result of her traumatizing her shins on the bottom of the card were trying to get out of the car. She states this is never happen with any other car doors before but she has been having some issues with this sharp area  on the store in particular. Both occurred in the similar situation where she was trying not to open the door the whole way in order to avoid damaging anybody else's car beside them. She is been using peroxide initially to clean the wound although she has converted to the wound cleanser now. She has been using if anything just over the counter triple antibiotic ointment and a Band-Aid. With that being said she's not even been using that more recently. The most part she's just been attempting allow this to dry out/scab over. Her APIs were greater than 220 and noncompressible. Patient does have a history of paroxysmal atrial fibrillation for which she is on Coumadin. She also has chronic kidney disease stage III. She has been on antibiotics that I see no evidence of infection right now which is good news. 01/24/18 on evaluation today patient appears to be doing excellent in regard to her lower extremity ulcers. She has been tolerating the dressing changes without complication which is excellent news. Overall I have been very pleased with her progress. Nonetheless she still has have some ways to go before this will be completely healed. The left is a little bit better in appearance compared to the right which is somewhat deeper. 01/31/18 on evaluation today patient appears to be doing better in regard to her bilateral anterior lower extremity ulcers. She has been tolerating the dressing changes without complication which is excellent news. Fortunately there does not appear to be any evidence of infection at this time which is also good news. I'm very pleased with how things seem to be progressing at this point. The patient likewise is very happy and states she's not having the pain that she was having previous. 02/07/18 on evaluation today patient appears to be doing rather well in regard to her ulcers. The left especially seems to be making good progress in the right does have more slough and poor  granulation tissue on the surface at this point. I think this is gonna require little bit more extensive debridement. Fortunately there's no signs of infection at this time. Patient History Information obtained from Patient. Family History Cancer - Mother, Diabetes - Siblings, Heart Disease - Father,Siblings, Hypertension - Siblings, Kidney Disease - Siblings, No family history of Hereditary Spherocytosis, Lung Disease, Seizures. Social History Never smoker, Marital Status - Married, Alcohol Use - Never, Drug Use - No History, Caffeine Use - Rarely. Medical History Eyes Denies history of Cataracts, Glaucoma, Optic Neuritis Ear/Nose/Mouth/Throat Mcclure, Timmy C. ([id number]) Denies history of Chronic sinus problems/congestion, Middle ear problems Hematologic/Lymphatic Denies history of Anemia, Hemophilia, Human Immunodeficiency Virus, Lymphedema, Sickle Cell Disease Respiratory Denies history of Aspiration, Asthma, Chronic Obstructive  Pulmonary Disease (COPD), Pneumothorax, Sleep Apnea, Tuberculosis Cardiovascular Patient has history of Congestive Heart Failure, Coronary Artery Disease Denies history of Angina, Arrhythmia, Deep Vein Thrombosis, Hypertension, Hypotension, Myocardial Infarction, Peripheral Arterial Disease, Peripheral Venous Disease, Phlebitis, Vasculitis Gastrointestinal Denies history of Cirrhosis , Colitis, Crohn s, Hepatitis A, Hepatitis B, Hepatitis C Endocrine Denies history of Type I Diabetes, Type II Diabetes Genitourinary Patient has history of End Stage Renal Disease Immunological Denies history of Lupus Erythematosus, Raynaud s, Scleroderma Integumentary (Skin) Denies history of History of Burn, History of pressure wounds Musculoskeletal Denies history of Gout, Rheumatoid Arthritis, Osteoarthritis, Osteomyelitis Neurologic Denies history of Dementia, Neuropathy, Quadriplegia, Paraplegia Oncologic Denies history of Received Chemotherapy, Received  Radiation Psychiatric Denies history of Anorexia/bulimia, Confinement Anxiety Review of Systems (ROS) Constitutional Symptoms (General Health) Denies complaints or symptoms of Fever, Chills. Respiratory The patient has no complaints or symptoms. Cardiovascular The patient has no complaints or symptoms. Psychiatric The patient has no complaints or symptoms. Objective Constitutional Well-nourished and well-hydrated in no acute distress. Vitals Time Taken: 9:45 AM, Height: 61 in, Weight: 100 lbs, BMI: 18.9, Temperature: 97.9 F, Pulse: 63 bpm, Respiratory Rate: 18 breaths/min, Blood Pressure: 124/55 mmHg. Respiratory normal breathing without difficulty. ZETTA, STONEMAN ([id number]) Psychiatric this patient is able to make decisions and demonstrates good insight into disease process. Alert and Oriented x 3. pleasant and cooperative. General Notes: Both the patient was did require sharp debridement today this was a fairly light debridement on the left as it appears to be doing much better. On the right I did have to perform a much more extensive debridement today to clear away for granulation tissue and slough from the surface of the wound. She tolerated this without any significant discomfort which was good news. Post debridement both wound bed's appear to be doing much better which is excellent news. Integumentary (Hair, Skin) Wound #1 status is Open. Original cause of wound was Trauma. The wound is located on the Right,Anterior Lower Leg. The wound measures 1.2cm length x 1cm width x 0.1cm depth; 0.942cm^2 area and 0.094cm^3 volume. There is Fat Layer (Subcutaneous Tissue) Exposed exposed. There is no tunneling or undermining noted. There is a small amount of serous drainage noted. The wound margin is flat and intact. There is no granulation within the wound bed. There is a large (67-100%) amount of necrotic tissue within the wound bed including Adherent Slough. The periwound skin  appearance exhibited: Hemosiderin Staining. The periwound skin appearance did not exhibit: Callus, Crepitus, Excoriation, Induration, Rash, Scarring, Dry/Scaly, Maceration, Atrophie Blanche, Cyanosis, Ecchymosis, Mottled, Pallor, Rubor, Erythema. The periwound has tenderness on palpation. Wound #2 status is Open. Original cause of wound was Trauma. The wound is located on the Left,Anterior Lower Leg. The wound measures 1cm length x 1cm width x 0.2cm depth; 0.785cm^2 area and 0.157cm^3 volume. There is Fat Layer (Subcutaneous Tissue) Exposed exposed. There is no tunneling or undermining noted. There is a small amount of serous drainage noted. The wound margin is flat and intact. There is no granulation within the wound bed. There is a large (67-100%) amount of necrotic tissue within the wound bed including Adherent Slough. The periwound skin appearance exhibited: Hemosiderin Staining. The periwound skin appearance did not exhibit: Callus, Crepitus, Excoriation, Induration, Rash, Scarring, Dry/Scaly, Maceration, Atrophie Blanche, Cyanosis, Ecchymosis, Mottled, Pallor, Rubor, Erythema. The periwound has tenderness on palpation. Assessment Active Problems ICD-10 Non-pressure chronic ulcer of other part of right lower leg with fat layer exposed Non-pressure chronic ulcer of other part of left lower  leg with fat layer exposed Unspecified open wound, left lower leg, initial encounter Unspecified open wound, right lower leg, initial encounter Paroxysmal atrial fibrillation Long term (current) use of anticoagulants Chronic kidney disease, stage 3 (moderate) Procedures Wound #1 Pre-procedure diagnosis of Wound #1 is a Trauma, Other located on the Right,Anterior Lower Leg . There was a Excisional Skin/Subcutaneous Tissue Debridement with a total area of 1.2 sq cm performed by STONE III, [place]ce[place] Holian E., PA-C. With the following instrument(s): Curette to remove Viable and Non-Viable tissue/material. Material  removed includes Subcutaneous Tissue and Slough and after achieving pain control using Lidocaine 4% Topical Solution. No specimens were taken. A time out was conducted at 10:24, prior to the start of the procedure. A Minimum amount of bleeding was controlled with Pressure. Klas, Alleyne C. ([id number]) The procedure was tolerated well with a pain level of 0 throughout and a pain level of 0 following the procedure. Post Debridement Measurements: 1.2cm length x 1cm width x 0.4cm depth; 0.377cm^3 volume. Character of Wound/Ulcer Post Debridement is improved. Post procedure Diagnosis Wound #1: Same as Pre-Procedure Wound #2 Pre-procedure diagnosis of Wound #2 is a Trauma, Other located on the Left,Anterior Lower Leg . There was a Excisional Skin/Subcutaneous Tissue Debridement with a total area of 1 sq cm performed by STONE III, Katera Rybka E., PA-C. With the following instrument(s): Curette to remove Viable and Non-Viable tissue/material. Material removed includes Subcutaneous Tissue and Slough and after achieving pain control using Lidocaine 4% Topical Solution. No specimens were taken. A time out was conducted at 10:31, prior to the start of the procedure. A Minimum amount of bleeding was controlled with Pressure. The procedure was tolerated well with a pain level of 0 throughout and a pain level of 0 following the procedure. Post Debridement Measurements: 1cm length x 1cm width x 0.2cm depth; 0.157cm^3 volume. Character of Wound/Ulcer Post Debridement is improved. Post procedure Diagnosis Wound #2: Same as Pre-Procedure Plan Wound Cleansing: Wound #1 Right,Anterior Lower Leg: Clean wound with Normal Saline. May Shower, gently pat wound dry prior to applying new dressing. Wound #2 Left,Anterior Lower Leg: Clean wound with Normal Saline. May Shower, gently pat wound dry prior to applying new dressing. Primary Wound Dressing: Wound #1 Right,Anterior Lower Leg: Hydrafera Blue Ready Transfer Wound #2  Left,Anterior Lower Leg: Hydrafera Blue Ready Transfer Secondary Dressing: Wound #1 Right,Anterior Lower Leg: Tegaderm Non-adherent pad Wound #2 Left,Anterior Lower Leg: Tegaderm Non-adherent pad Dressing Change Frequency: Wound #1 Right,Anterior Lower Leg: Change Dressing Monday, Wednesday, Friday Wound #2 Left,Anterior Lower Leg: Change Dressing Monday, Wednesday, Friday Follow-up Appointments: Wound #1 Right,Anterior Lower Leg: Return Appointment in 1 week. Wound #2 Left,Anterior Lower Leg: Return Appointment in 1 week. Edema Control: Wound #1 Right,Anterior Lower Leg: Elevate legs to the level of the heart and pump ankles as often as possible Wound #2 Left,Anterior Lower Leg: Elevate legs to the level of the heart and pump ankles as often as possible Mcginnis, Tanecia C. ([id number]) My suggestion at this point is gonna be that we go ahead and continue with the above wound care measures for the next week. If anything changes or worsens in the meantime patient will contact the office and let me know. Otherwise we will subsequently see were things stand at follow-up. Follow point Please see above for specific wound care orders. We will see patient for re-evaluation in 1 week(s) here in the clinic. If anything worsens or changes patient will contact our office for additional recommendations. Electronic Signature(s) Signed: 02/07/2018 5:12:31 PM By:  Melburn Hake, Hayes Rehfeldt PA-C Entered By: Worthy Keeler on 02/07/2018 10:38:32 Mccree, Tanna Savoy ([id number]) -------------------------------------------------------------------------------- ROS/PFSH Details Patient Name: Yasuda, [name] Date of Service: 02/07/2018 9:45 AM Medical Record Number: [id number] Patient Account Number: [id number] Date of Birth/Sex: Dec 31, 1931 (86 y.o. F) Treating RN: Montey Hora Primary Care Provider: Harrel Lemon Other Clinician: Referring Provider: Harrel Lemon Treating Provider/Extender: Melburn Hake, Yasheka Fossett Weeks in  Treatment: 3 Information Obtained From Patient Wound History Do you currently have one or more open woundso Yes How many open wounds do you currently haveo 2 Approximately how long have you had your woundso 8 weeks How have you been treating your wound(s) until nowo peroxide, neosporin and wound cleanser and bandage. Has your wound(s) ever healed and then re-openedo No Have you had any lab work done in the past montho No Have you tested positive for an antibiotic resistant organism No (MRSA, VRE)o Have you tested positive for osteomyelitis (bone infection)o No Have you had any tests for circulation on your legso No Constitutional Symptoms (General Health) Complaints and Symptoms: Negative for: Fever; Chills Eyes Medical History: Negative for: Cataracts; Glaucoma; Optic Neuritis Ear/Nose/Mouth/Throat Medical History: Negative for: Chronic sinus problems/congestion; Middle ear problems Hematologic/Lymphatic Medical History: Negative for: Anemia; Hemophilia; Human Immunodeficiency Virus; Lymphedema; Sickle Cell Disease Respiratory Complaints and Symptoms: No Complaints or Symptoms Medical History: Negative for: Aspiration; Asthma; Chronic Obstructive Pulmonary Disease (COPD); Pneumothorax; Sleep Apnea; Tuberculosis Cardiovascular Complaints and Symptoms: No Complaints or Symptoms Zirkelbach, Shawne C. ([id number]) Medical History: Positive for: Congestive Heart Failure; Coronary Artery Disease Negative for: Angina; Arrhythmia; Deep Vein Thrombosis; Hypertension; Hypotension; Myocardial Infarction; Peripheral Arterial Disease; Peripheral Venous Disease; Phlebitis; Vasculitis Gastrointestinal Medical History: Negative for: Cirrhosis ; Colitis; Crohnos; Hepatitis A; Hepatitis B; Hepatitis C Endocrine Medical History: Negative for: Type I Diabetes; Type II Diabetes Genitourinary Medical History: Positive for: End Stage Renal Disease Immunological Medical History: Negative for:  Lupus Erythematosus; Raynaudos; Scleroderma Integumentary (Skin) Medical History: Negative for: History of Burn; History of pressure wounds Musculoskeletal Medical History: Negative for: Gout; Rheumatoid Arthritis; Osteoarthritis; Osteomyelitis Neurologic Medical History: Negative for: Dementia; Neuropathy; Quadriplegia; Paraplegia Oncologic Medical History: Negative for: Received Chemotherapy; Received Radiation Psychiatric Complaints and Symptoms: No Complaints or Symptoms Medical History: Negative for: Anorexia/bulimia; Confinement Anxiety Immunizations Pneumococcal Vaccine: Received Pneumococcal Vaccination: Yes Implantable Devices Pike, [name] ([id number]) Family and Social History Cancer: Yes - Mother; Diabetes: Yes - Siblings; Heart Disease: Yes - Father,Siblings; Hereditary Spherocytosis: No; Hypertension: Yes - Siblings; Kidney Disease: Yes - Siblings; Lung Disease: No; Seizures: No; Never smoker; Marital Status - Married; Alcohol Use: Never; Drug Use: No History; Caffeine Use: Rarely; Financial Concerns: No; Food, Clothing or Shelter Needs: No; Support System Lacking: No; Transportation Concerns: No; Advanced Directives: Yes (Not Provided); Patient does not want information on Advanced Directives; Do not resuscitate: Yes (Not Provided); Living Will: Yes (Not Provided); Medical Power of Attorney: Yes (Not Provided) Physician Affirmation I have reviewed and agree with the above information. Electronic Signature(s) Signed: 02/07/2018 5:12:31 PM By: Worthy Keeler PA-C Signed: 02/07/2018 5:16:27 PM By: Montey Hora Entered By: Worthy Keeler on 02/07/2018 10:37:39 Doerner, Anetria C. ([id number]) -------------------------------------------------------------------------------- SuperBill Details Patient Name: Stockinger, [name] Date of Service: 02/07/2018 Medical Record Number: [id number] Patient Account Number: [id number] Date of Birth/Sex: [birth year] (85 y.o. F) Treating RN:  Montey Hora Primary Care Provider: Harrel Lemon Other Clinician: Referring Provider: Harrel Lemon Treating Provider/Extender: Melburn Hake, Delcia Spitzley Weeks in Treatment: 3 Diagnosis Coding ICD-10 Codes Code Description [phone] Non-pressure chronic ulcer of other part  of right lower leg with fat layer exposed L97.822 Non-pressure chronic ulcer of other part of left lower leg with fat layer exposed S81.802A Unspecified open wound, left lower leg, initial encounter S81.801A Unspecified open wound, right lower leg, initial encounter I48.0 Paroxysmal atrial fibrillation Z79.01 Long term (current) use of anticoagulants N18.3 Chronic kidney disease, stage 3 (moderate) Facility Procedures CPT4 Code Description: [id number] 11042 - DEB SUBQ TISSUE 20 SQ CM/< ICD-10 Diagnosis Description L97.812 Non-pressure chronic ulcer of other part of right lower leg wi L97.822 Non-pressure chronic ulcer of other part of left lower leg wit Modifier: th fat layer expo h fat layer expos Quantity: 1 sed ed Physician Procedures CPT4 Code Description: [id number] 11042 - WC PHYS SUBQ TISS 20 SQ CM ICD-10 Diagnosis Description O70.786 Non-pressure chronic ulcer of other part of right lower leg wi L97.822 Non-pressure chronic ulcer of other part of left lower leg wit Modifier: th fat layer expo h fat layer expos Quantity: 1 sed ed Electronic Signature(s) Signed: 02/07/2018 5:12:31 PM By: Worthy Keeler PA-C Entered By: Worthy Keeler on 02/07/2018 10:38:45

## 2018-02-10 DIAGNOSIS — N2581 Secondary hyperparathyroidism of renal origin: Secondary | ICD-10-CM | POA: Diagnosis not present

## 2018-02-10 DIAGNOSIS — N183 Chronic kidney disease, stage 3 (moderate): Secondary | ICD-10-CM | POA: Diagnosis not present

## 2018-02-10 DIAGNOSIS — I129 Hypertensive chronic kidney disease with stage 1 through stage 4 chronic kidney disease, or unspecified chronic kidney disease: Secondary | ICD-10-CM | POA: Diagnosis not present

## 2018-02-11 ENCOUNTER — Other Ambulatory Visit: Payer: Self-pay | Admitting: Internal Medicine

## 2018-02-11 DIAGNOSIS — Z1231 Encounter for screening mammogram for malignant neoplasm of breast: Secondary | ICD-10-CM

## 2018-02-14 ENCOUNTER — Encounter: Payer: PPO | Attending: Physician Assistant | Admitting: Physician Assistant

## 2018-02-14 DIAGNOSIS — I480 Paroxysmal atrial fibrillation: Secondary | ICD-10-CM | POA: Diagnosis not present

## 2018-02-14 DIAGNOSIS — Z7901 Long term (current) use of anticoagulants: Secondary | ICD-10-CM | POA: Diagnosis not present

## 2018-02-14 DIAGNOSIS — N183 Chronic kidney disease, stage 3 (moderate): Secondary | ICD-10-CM | POA: Diagnosis not present

## 2018-02-14 DIAGNOSIS — I132 Hypertensive heart and chronic kidney disease with heart failure and with stage 5 chronic kidney disease, or end stage renal disease: Secondary | ICD-10-CM | POA: Insufficient documentation

## 2018-02-14 DIAGNOSIS — I509 Heart failure, unspecified: Secondary | ICD-10-CM | POA: Insufficient documentation

## 2018-02-14 DIAGNOSIS — L97822 Non-pressure chronic ulcer of other part of left lower leg with fat layer exposed: Secondary | ICD-10-CM | POA: Diagnosis not present

## 2018-02-14 DIAGNOSIS — L97812 Non-pressure chronic ulcer of other part of right lower leg with fat layer exposed: Secondary | ICD-10-CM | POA: Insufficient documentation

## 2018-02-17 DIAGNOSIS — L97822 Non-pressure chronic ulcer of other part of left lower leg with fat layer exposed: Secondary | ICD-10-CM | POA: Diagnosis not present

## 2018-02-18 NOTE — Progress Notes (Signed)
PETER, Elizabeth Fields ([id number]) Visit Report for 02/17/2018 Arrival Information Details Patient Name: Elizabeth Fields, [name] Date of Service: 02/17/2018 1:30 PM Medical Record Number: [id number] Patient Account Number: [id number] Date of Birth/Sex: 09-18-1931 (86 y.o. F) Treating RN: Elizabeth Fields Primary Care Elizabeth Fields: Elizabeth Fields Other Clinician: Referring Elizabeth Fields: Elizabeth Fields Treating Elizabeth Fields/Extender: Elizabeth Fields, Elizabeth Fields in Treatment: 4 Visit Information History Since Last Visit All ordered tests and consults were completed: No Patient Arrived: Ambulatory Added or deleted any medications: No Arrival Time: 13:25 Any new allergies or adverse reactions: No Accompanied By: self Had a fall or experienced change in No Transfer Assistance: None activities of daily living that may affect Patient Identification Verified: Yes risk of falls: Signs or symptoms of abuse/neglect since last visito No Hospitalized since last visit: No Implantable device outside of the clinic excluding No cellular tissue based products placed in the center since last visit: Has Dressing in Place as Prescribed: Yes Has Compression in Place as Prescribed: Yes Pain Present Now: Yes Electronic Signature(s) Signed: 02/17/2018 4:51:21 PM By: Elizabeth Fields Entered By: Elizabeth Fields on 02/17/2018 13:28:42 Killings, Orlene C. ([id number]) -------------------------------------------------------------------------------- Clinic Level of Care Assessment Details Patient Name: [name], [name] Date of Service: 02/17/2018 1:30 PM Medical Record Number: [id number] Patient Account Number: [id number] Date of Birth/Sex: [birth year] (86 y.o. F) Treating RN: Elizabeth Fields Primary Care Hearl Heikes: Elizabeth Fields Other Clinician: Referring Kanasia Gayman: Elizabeth Fields Treating Jessy Calixte/Extender: Elizabeth Fields, Elizabeth Fields in Treatment: 4 Clinic Level of Care Assessment Items TOOL 4 Quantity Score []  - Use when only an EandM is performed on FOLLOW-UP visit  0 ASSESSMENTS - Nursing Assessment / Reassessment []  - Reassessment of Co-morbidities (includes updates in patient status) 0 []  - 0 Reassessment of Adherence to Treatment Plan ASSESSMENTS - Wound and Skin Assessment / Reassessment []  - Simple Wound Assessment / Reassessment - one wound 0 X- 2 5 Complex Wound Assessment / Reassessment - multiple wounds []  - 0 Dermatologic / Skin Assessment (not related to wound area) ASSESSMENTS - Focused Assessment []  - Circumferential Edema Measurements - multi extremities 0 []  - 0 Nutritional Assessment / Counseling / Intervention []  - 0 Lower Extremity Assessment (monofilament, tuning fork, pulses) []  - 0 Peripheral Arterial Disease Assessment (using hand held doppler) ASSESSMENTS - Ostomy and/or Continence Assessment and Care []  - Incontinence Assessment and Management 0 []  - 0 Ostomy Care Assessment and Management (repouching, etc.) PROCESS - Coordination of Care X - Simple Patient / Family Education for ongoing care 1 15 []  - 0 Complex (extensive) Patient / Family Education for ongoing care []  - 0 Staff obtains Programmer, systems, Records, Test Results / Process Orders []  - 0 Staff telephones HHA, Nursing Homes / Clarify orders / etc []  - 0 Routine Transfer to another Facility (non-emergent condition) []  - 0 Routine Hospital Admission (non-emergent condition) []  - 0 New Admissions / Biomedical engineer / Ordering NPWT, Apligraf, etc. []  - 0 Emergency Hospital Admission (emergent condition) X- 1 10 Simple Discharge Coordination [name], [name] ([id number]) []  - 0 Complex (extensive) Discharge Coordination PROCESS - Special Needs []  - Pediatric / Minor Patient Management 0 []  - 0 Isolation Patient Management []  - 0 Hearing / Language / Visual special needs []  - 0 Assessment of Community assistance (transportation, D/C planning, etc.) []  - 0 Additional assistance / Altered mentation []  - 0 Support Surface(s) Assessment (bed,  cushion, seat, etc.) INTERVENTIONS - Wound Cleansing / Measurement []  - Simple Wound Cleansing - one wound 0 X- 2 5 Complex Wound Cleansing -  multiple wounds []  - 0 Wound Imaging (photographs - any number of wounds) []  - 0 Wound Tracing (instead of photographs) []  - 0 Simple Wound Measurement - one wound X- 2 5 Complex Wound Measurement - multiple wounds INTERVENTIONS - Wound Dressings []  - Small Wound Dressing one or multiple wounds 0 X- 2 15 Medium Wound Dressing one or multiple wounds []  - 0 Large Wound Dressing one or multiple wounds []  - 0 Application of Medications - topical []  - 0 Application of Medications - injection INTERVENTIONS - Miscellaneous []  - External ear exam 0 []  - 0 Specimen Collection (cultures, biopsies, blood, body fluids, etc.) []  - 0 Specimen(s) / Culture(s) sent or taken to Lab for analysis []  - 0 Patient Transfer (multiple staff / Civil Service fast streamer / Similar devices) []  - 0 Simple Staple / Suture removal (25 or less) []  - 0 Complex Staple / Suture removal (26 or more) []  - 0 Hypo / Hyperglycemic Management (close monitor of Blood Glucose) []  - 0 Ankle / Brachial Index (ABI) - do not check if billed separately []  - 0 Vital Signs [name], [name] ([id number]) Has the patient been seen at the hospital within the last three years: Yes Total Score: 85 Level Of Care: New/Established - Level 3 Electronic Signature(s) Signed: 02/17/2018 4:51:21 PM By: Elizabeth Fields Entered By: Elizabeth Fields on 02/17/2018 14:52:21 [name], [name] ([id number]) -------------------------------------------------------------------------------- Encounter Discharge Information Details Patient Name: [name], [name] Date of Service: 02/17/2018 1:30 PM Medical Record Number: [id number] Patient Account Number: [id number] Date of Birth/Sex: 11-18-1931 (86 y.o. F) Treating RN: Elizabeth Fields Primary Care Elizabeth Fields: Elizabeth Fields Other Clinician: Referring Elizabeth Fields: Elizabeth Fields Treating  Elizabeth Fields/Extender: Elizabeth Fields, Elizabeth Fields in Treatment: 4 Encounter Discharge Information Items Discharge Condition: Stable Ambulatory Status: Ambulatory Discharge Destination: Home Transportation: Private Auto Accompanied By: self Schedule Follow-up Appointment: Yes Clinical Summary of Care: Electronic Signature(s) Signed: 02/17/2018 4:51:21 PM By: Elizabeth Fields Entered By: Elizabeth Fields on 02/17/2018 13:55:01 Tugman, [name] ([id number]) -------------------------------------------------------------------------------- Patient/Caregiver Education Details Patient Name: Catalina Pizza. Date of Service: 02/17/2018 1:30 PM Medical Record Number: [id number] Patient Account Number: [id number] Date of Birth/Gender: 1931-11-17 (85 y.o. F) Treating RN: Elizabeth Fields Primary Care Physician: Elizabeth Fields Other Clinician: Referring Physician: Harrel Fields Treating Physician/Extender: Sharalyn Ink in Treatment: 4 Education Assessment Education Provided To: Patient Education Topics Provided Wound/Skin Impairment: Handouts: Caring for Your Ulcer Methods: Demonstration, Explain/Verbal Electronic Signature(s) Signed: 02/17/2018 4:51:21 PM By: Elizabeth Fields Entered By: Elizabeth Fields on 02/17/2018 13:54:49 Narez, [name] ([id number]) -------------------------------------------------------------------------------- Wound Assessment Details Patient Name: Stratton, [name] Date of Service: 02/17/2018 1:30 PM Medical Record Number: [id number] Patient Account Number: [id number] Date of Birth/Sex: 02-01-1931 (86 y.o. F) Treating RN: Elizabeth Fields Primary Care Fredi Geiler: Elizabeth Fields Other Clinician: Referring Theordore Cisnero: Elizabeth Fields Treating Gilford Lardizabal/Extender: Elizabeth Fields, Elizabeth Fields in Treatment: 4 Wound Status Wound Number: 1 Primary Trauma, Other Etiology: Wound Location: Right Lower Leg - Anterior Wound Open Wounding Event: Trauma Status: Date Acquired: 12/26/2017 Comorbid Congestive Heart  Failure, Coronary Artery Fields Of Treatment: 4 History: Disease, End Stage Renal Disease Clustered Wound: No Wound Measurements Length: (cm) 2.1 Width: (cm) 2 Depth: (cm) 0.2 Area: (cm) 3.299 Volume: (cm) 0.66 % Reduction in Area: -27.3% % Reduction in Volume: -154.8% Epithelialization: None Tunneling: No Undermining: No Wound Description Full Thickness Without Exposed Support Classification: Structures Wound Margin: Flat and Intact Exudate Small Amount: Exudate Type: Serous Exudate Color: amber Foul Odor After Cleansing: No Slough/Fibrino Yes Wound Bed Granulation Amount: None  Present (0%) Exposed Structure Necrotic Amount: Large (67-100%) Fascia Exposed: No Necrotic Quality: Adherent Slough Fat Layer (Subcutaneous Tissue) Exposed: Yes Tendon Exposed: No Muscle Exposed: No Joint Exposed: No Bone Exposed: No Periwound Skin Texture Texture Color No Abnormalities Noted: No No Abnormalities Noted: No Callus: No Atrophie Blanche: No Crepitus: No Cyanosis: No Excoriation: No Ecchymosis: No Induration: No Erythema: No Rash: No Hemosiderin Staining: Yes Scarring: No Mottled: No Pallor: No Moisture Rubor: No No Abnormalities Noted: No Dry / Scaly: No Temperature / Pain Cowger, [name] ([id number]) Maceration: No Temperature: No Abnormality Tenderness on Palpation: Yes Wound Preparation Ulcer Cleansing: Rinsed/Irrigated with Saline Topical Anesthetic Applied: Other: lidocaine 4%, Treatment Notes Wound #1 (Right, Anterior Lower Leg) Notes mupirocin, mepitel, hydrafera blue, non adherent pad, conform and tubigrip Electronic Signature(s) Signed: 02/17/2018 4:51:21 PM By: Elizabeth Fields Entered By: Elizabeth Fields on 02/17/2018 13:35:12 Elizabeth Fields, [name] ([id number]) -------------------------------------------------------------------------------- Wound Assessment Details Patient Name: Elizabeth Fields, [name] Date of Service: 02/17/2018 1:30 PM Medical Record Number:  [id number] Patient Account Number: [id number] Date of Birth/Sex: 1931/01/23 (86 y.o. F) Treating RN: Elizabeth Fields Primary Care Massiah Longanecker: Elizabeth Fields Other Clinician: Referring Jolyssa Oplinger: Elizabeth Fields Treating Lorraine Cimmino/Extender: Elizabeth Fields, Elizabeth Fields in Treatment: 4 Wound Status Wound Number: 2 Primary Trauma, Other Etiology: Wound Location: Left Lower Leg - Anterior Wound Open Wounding Event: Trauma Status: Date Acquired: 11/17/2017 Comorbid Congestive Heart Failure, Coronary Artery Fields Of Treatment: 4 History: Disease, End Stage Renal Disease Clustered Wound: No Wound Measurements Length: (cm) 1.5 Width: (cm) 1.5 Depth: (cm) 0.2 Area: (cm) 1.767 Volume: (cm) 0.353 % Reduction in Area: -181.4% % Reduction in Volume: -460.3% Epithelialization: None Tunneling: No Undermining: No Wound Description Full Thickness Without Exposed Support Classification: Structures Wound Margin: Flat and Intact Exudate Medium Amount: Exudate Type: Serous Exudate Color: amber Foul Odor After Cleansing: No Slough/Fibrino Yes Wound Bed Granulation Amount: None Present (0%) Exposed Structure Necrotic Amount: Medium (34-66%) Fascia Exposed: No Necrotic Quality: Adherent Slough Fat Layer (Subcutaneous Tissue) Exposed: Yes Tendon Exposed: No Muscle Exposed: No Joint Exposed: No Bone Exposed: No Periwound Skin Texture Texture Color No Abnormalities Noted: No No Abnormalities Noted: No Callus: No Atrophie Blanche: No Crepitus: No Cyanosis: No Excoriation: No Ecchymosis: No Induration: No Erythema: No Rash: No Hemosiderin Staining: Yes Scarring: No Mottled: No Pallor: No Moisture Rubor: No No Abnormalities Noted: No Dry / Scaly: No Temperature / Pain Elizabeth Fields, [name] ([id number]) Maceration: No Temperature: No Abnormality Tenderness on Palpation: Yes Wound Preparation Ulcer Cleansing: Rinsed/Irrigated with Saline Topical Anesthetic Applied: Other: lidocaine  4%, Treatment Notes Wound #2 (Left, Anterior Lower Leg) Notes mupirocin, mepitel, hydrafera blue, non adherent pad, conform and tubigrip Electronic Signature(s) Signed: 02/17/2018 4:51:21 PM By: Elizabeth Fields Entered By: Elizabeth Fields on 02/17/2018 13:35:32

## 2018-02-19 NOTE — Progress Notes (Signed)
DEZYRE, HOEFER ([id number]) Visit Report for 02/14/2018 Chief Complaint Document Details Patient Name: Pantano, Hargun C. Date of Service: 02/14/2018 10:45 AM Medical Record Number: [id number] Patient Account Number: [id number] Date of Birth/Sex: 1931/10/18 (86 y.o. F) Treating RN: Montey Hora Primary Care Provider: Harrel Lemon Other Clinician: Referring Provider: Harrel Lemon Treating Provider/Extender: Melburn Hake, Dillie Burandt Weeks in Treatment: 4 Information Obtained from: Patient Chief Complaint Bilateral LE Ulcers Electronic Signature(s) Signed: 02/16/2018 3:37:27 PM By: Worthy Keeler PA-C Entered By: Worthy Keeler on 02/14/2018 10:53:21 Dimascio, Kaitlynd C. ([id number]) -------------------------------------------------------------------------------- HPI Details Patient Name: Cloward, Jennfier C. Date of Service: 02/14/2018 10:45 AM Medical Record Number: [id number] Patient Account Number: [id number] Date of Birth/Sex: 1931-04-07 (86 y.o. F) Treating RN: Montey Hora Primary Care Provider: Harrel Lemon Other Clinician: Referring Provider: Harrel Lemon Treating Provider/Extender: Melburn Hake, Franki Stemen Weeks in Treatment: 4 History of Present Illness HPI Description: 01/17/18 on evaluation today patient presents for evaluation of two ulcers both on the anterior portion of her lower extremities one right and one left. The right she has had for about three weeks the left for about eight weeks and both occurred as a result of her traumatizing her shins on the bottom of the card were trying to get out of the car. She states this is never happen with any other car doors before but she has been having some issues with this sharp area on the store in particular. Both occurred in the similar situation where she was trying not to open the door the whole way in order to avoid damaging anybody else's car beside them. She is been using peroxide initially to clean the wound although she has converted to the wound  cleanser now. She has been using if anything just over the counter triple antibiotic ointment and a Band-Aid. With that being said she's not even been using that more recently. The most part she's just been attempting allow this to dry out/scab over. Her APIs were greater than 220 and noncompressible. Patient does have a history of paroxysmal atrial fibrillation for which she is on Coumadin. She also has chronic kidney disease stage III. She has been on antibiotics that I see no evidence of infection right now which is good news. 01/24/18 on evaluation today patient appears to be doing excellent in regard to her lower extremity ulcers. She has been tolerating the dressing changes without complication which is excellent news. Overall I have been very pleased with her progress. Nonetheless she still has have some ways to go before this will be completely healed. The left is a little bit better in appearance compared to the right which is somewhat deeper. 01/31/18 on evaluation today patient appears to be doing better in regard to her bilateral anterior lower extremity ulcers. She has been tolerating the dressing changes without complication which is excellent news. Fortunately there does not appear to be any evidence of infection at this time which is also good news. I'm very pleased with how things seem to be progressing at this point. The patient likewise is very happy and states she's not having the pain that she was having previous. 02/07/18 on evaluation today patient appears to be doing rather well in regard to her ulcers. The left especially seems to be making good progress in the right does have more slough and poor granulation tissue on the surface at this point. I think this is gonna require little bit more extensive debridement. Fortunately there's no signs of infection at this time. 02/14/18  on evaluation today patient appears to be doing better in the overall appearance of the wounds although  she tells me she's been having more burning over the past week. There appears to be some adhesive irritation. That has me somewhat concerned as far as the reason for the burning is concerned. With that being said I do not see any signs of actual infection which is good news. No fevers, chills, nausea, or vomiting noted at this time. Overall there does not appear to be any sign that there is worsening to me and the only reason the right extremity wound is bigger is that I actually performed a fairly significant debridement last week. That maybe some of the reason why she's had a little bit more pain as well. Electronic Signature(s) Signed: 02/16/2018 3:37:27 PM By: Worthy Keeler PA-C Entered By: Worthy Keeler on 02/14/2018 12:31:24 Kranz, Tanna Savoy ([id number]) -------------------------------------------------------------------------------- Physical Exam Details Patient Name: Aguirre, Addilynne C. Date of Service: 02/14/2018 10:45 AM Medical Record Number: [id number] Patient Account Number: [id number] Date of Birth/Sex: 1931-09-18 (86 y.o. F) Treating RN: Montey Hora Primary Care Provider: Harrel Lemon Other Clinician: Referring Provider: Harrel Lemon Treating Provider/Extender: Melburn Hake, Maejor Erven Weeks in Treatment: 4 Constitutional Well-nourished and well-hydrated in no acute distress. Respiratory normal breathing without difficulty. clear to auscultation bilaterally. Cardiovascular regular rate and rhythm with normal S1, S2. 1+ pitting edema of the bilateral lower extremities. Psychiatric this patient is able to make decisions and demonstrates good insight into disease process. Alert and Oriented x 3. pleasant and cooperative. Notes Patient's wound bed currently did have some Slough noted that no sharp debridement was performed simply due to the fact that I did not want to cause any worsening overall of the wounds themselves. In regard to pain that is. Nonetheless I think that the overall  quality of the wound bed appears much better I do believe the [facility] Dressing beneficial in that regard. The only caveat is she did have some issues with the dressing getting stuck to the areas. Electronic Signature(s) Signed: 02/16/2018 3:37:27 PM By: Worthy Keeler PA-C Entered By: Worthy Keeler on 02/14/2018 12:32:56 Sidor, Louan Loletha Grayer ([id number]) -------------------------------------------------------------------------------- Physician Orders Details Patient Name: Mirante, Raniyah C. Date of Service: 02/14/2018 10:45 AM Medical Record Number: [id number] Patient Account Number: [id number] Date of Birth/Sex: 07-Feb-1931 (86 y.o. F) Treating RN: Cornell Barman Primary Care Provider: Harrel Lemon Other Clinician: Referring Provider: Harrel Lemon Treating Provider/Extender: Melburn Hake, Anayla Giannetti Weeks in Treatment: 4 Verbal / Phone Orders: No Diagnosis Coding ICD-10 Coding Code Description [phone] Non-pressure chronic ulcer of other part of right lower leg with fat layer exposed L97.822 Non-pressure chronic ulcer of other part of left lower leg with fat layer exposed S81.802A Unspecified open wound, left lower leg, initial encounter S81.801A Unspecified open wound, right lower leg, initial encounter I48.0 Paroxysmal atrial fibrillation Z79.01 Long term (current) use of anticoagulants N18.3 Chronic kidney disease, stage 3 (moderate) Wound Cleansing Wound #1 Right,Anterior Lower Leg o Clean wound with Normal Saline. o May Shower, gently pat wound dry prior to applying new dressing. Wound #2 Left,Anterior Lower Leg o Clean wound with Normal Saline. o May Shower, gently pat wound dry prior to applying new dressing. Primary Wound Dressing Wound #1 Right,Anterior Lower Leg o Mupirocin Ointment - on wound bed o Hydrafera Blue Ready Transfer - on top of mupirocin Wound #2 Left,Anterior Lower Leg o Mupirocin Ointment - on wound bed o Hydrafera Blue Ready Transfer - on top of  mupirocin Secondary  Dressing Wound #1 Right,Anterior Lower Leg o ABD and Kerlix/Conform o Mepitel One or equivalent Wound #2 Left,Anterior Lower Leg o ABD and Kerlix/Conform o Mepitel One or equivalent Dressing Change Frequency Wound #1 Right,Anterior Lower Leg o Other: - twice weekly Wound #2 Left,Anterior Lower Leg Mata, Yasmene C. ([id number]) o Other: - twice weekly Follow-up Appointments Wound #1 Right,Anterior Lower Leg o Return Appointment in 1 week. o Nurse Visit as needed - Monday or Tuesday Wound #2 Left,Anterior Lower Leg o Return Appointment in 1 week. o Nurse Visit as needed - Monday or Tuesday Edema Control Wound #1 Right,Anterior Lower Leg o Elevate legs to the level of the heart and pump ankles as often as possible o Other: - tubigrip Wound #2 Left,Anterior Lower Leg o Elevate legs to the level of the heart and pump ankles as often as possible o Other: - tubigrip Electronic Signature(s) Signed: 02/16/2018 3:37:27 PM By: Worthy Keeler PA-C Signed: 02/18/2018 11:53:49 AM By: Gretta Cool, BSN, RN, CWS, Kim RN, BSN Entered By: Gretta Cool, BSN, RN, CWS, Kim on 02/14/2018 12:15:25 Josey, Renu Loletha Grayer ([id number]) -------------------------------------------------------------------------------- Problem List Details Patient Name: Mogg, Rylei C. Date of Service: 02/14/2018 10:45 AM Medical Record Number: [id number] Patient Account Number: [id number] Date of Birth/Sex: 24-Nov-1931 (86 y.o. F) Treating RN: Montey Hora Primary Care Provider: Harrel Lemon Other Clinician: Referring Provider: Harrel Lemon Treating Provider/Extender: Melburn Hake, Raelin Pixler Weeks in Treatment: 4 Active Problems ICD-10 Evaluated Encounter Code Description Active Date Today Diagnosis L97.812 Non-pressure chronic ulcer of other part of right lower leg 01/17/2018 No Yes with fat layer exposed L97.822 Non-pressure chronic ulcer of other part of left lower leg with 01/17/2018 No Yes fat  layer exposed S81.802A Unspecified open wound, left lower leg, initial encounter 01/17/2018 No Yes S81.801A Unspecified open wound, right lower leg, initial encounter 01/17/2018 No Yes I48.0 Paroxysmal atrial fibrillation 01/17/2018 No Yes Z79.01 Long term (current) use of anticoagulants 01/17/2018 No Yes N18.3 Chronic kidney disease, stage 3 (moderate) 01/17/2018 No Yes Inactive Problems Resolved Problems Electronic Signature(s) Signed: 02/16/2018 3:37:27 PM By: Worthy Keeler PA-C Entered By: Worthy Keeler on 02/14/2018 10:53:17 Newsom, Kimanh C. ([id number]) -------------------------------------------------------------------------------- Progress Note Details Patient Name: Kramp, Ivania C. Date of Service: 02/14/2018 10:45 AM Medical Record Number: [id number] Patient Account Number: [id number] Date of Birth/Sex: 05/14/1931 (86 y.o. F) Treating RN: Montey Hora Primary Care Provider: Harrel Lemon Other Clinician: Referring Provider: Harrel Lemon Treating Provider/Extender: Melburn Hake, Devan Babino Weeks in Treatment: 4 Subjective Chief Complaint Information obtained from Patient Bilateral LE Ulcers History of Present Illness (HPI) 01/17/18 on evaluation today patient presents for evaluation of two ulcers both on the anterior portion of her lower extremities one right and one left. The right she has had for about three weeks the left for about eight weeks and both occurred as a result of her traumatizing her shins on the bottom of the card were trying to get out of the car. She states this is never happen with any other car doors before but she has been having some issues with this sharp area on the store in particular. Both occurred in the similar situation where she was trying not to open the door the whole way in order to avoid damaging anybody else's car beside them. She is been using peroxide initially to clean the wound although she has converted to the wound cleanser now. She has been using  if anything just over the counter triple antibiotic ointment and a Band-Aid. With that being said she's not even  been using that more recently. The most part she's just been attempting allow this to dry out/scab over. Her APIs were greater than 220 and noncompressible. Patient does have a history of paroxysmal atrial fibrillation for which she is on Coumadin. She also has chronic kidney disease stage III. She has been on antibiotics that I see no evidence of infection right now which is good news. 01/24/18 on evaluation today patient appears to be doing excellent in regard to her lower extremity ulcers. She has been tolerating the dressing changes without complication which is excellent news. Overall I have been very pleased with her progress. Nonetheless she still has have some ways to go before this will be completely healed. The left is a little bit better in appearance compared to the right which is somewhat deeper. 01/31/18 on evaluation today patient appears to be doing better in regard to her bilateral anterior lower extremity ulcers. She has been tolerating the dressing changes without complication which is excellent news. Fortunately there does not appear to be any evidence of infection at this time which is also good news. I'm very pleased with how things seem to be progressing at this point. The patient likewise is very happy and states she's not having the pain that she was having previous. 02/07/18 on evaluation today patient appears to be doing rather well in regard to her ulcers. The left especially seems to be making good progress in the right does have more slough and poor granulation tissue on the surface at this point. I think this is gonna require little bit more extensive debridement. Fortunately there's no signs of infection at this time. 02/14/18 on evaluation today patient appears to be doing better in the overall appearance of the wounds although she tells me she's been having  more burning over the past week. There appears to be some adhesive irritation. That has me somewhat concerned as far as the reason for the burning is concerned. With that being said I do not see any signs of actual infection which is good news. No fevers, chills, nausea, or vomiting noted at this time. Overall there does not appear to be any sign that there is worsening to me and the only reason the right extremity wound is bigger is that I actually performed a fairly significant debridement last week. That maybe some of the reason why she's had a little bit more pain as well. Patient History Information obtained from Patient. Family History Cancer - Mother, Diabetes - Siblings, Heart Disease - Father,Siblings, Hypertension - Siblings, Kidney Disease - Siblings, No family history of Hereditary Spherocytosis, Lung Disease, Seizures. MAVERICK, PATMAN ([id number]) Social History Never smoker, Marital Status - Married, Alcohol Use - Never, Drug Use - No History, Caffeine Use - Rarely. Medical History Eyes Denies history of Cataracts, Glaucoma, Optic Neuritis Ear/Nose/Mouth/Throat Denies history of Chronic sinus problems/congestion, Middle ear problems Hematologic/Lymphatic Denies history of Anemia, Hemophilia, Human Immunodeficiency Virus, Lymphedema, Sickle Cell Disease Respiratory Denies history of Aspiration, Asthma, Chronic Obstructive Pulmonary Disease (COPD), Pneumothorax, Sleep Apnea, Tuberculosis Cardiovascular Patient has history of Congestive Heart Failure, Coronary Artery Disease Denies history of Angina, Arrhythmia, Deep Vein Thrombosis, Hypertension, Hypotension, Myocardial Infarction, Peripheral Arterial Disease, Peripheral Venous Disease, Phlebitis, Vasculitis Gastrointestinal Denies history of Cirrhosis , Colitis, Crohn s, Hepatitis A, Hepatitis B, Hepatitis C Endocrine Denies history of Type I Diabetes, Type II Diabetes Genitourinary Patient has history of End Stage Renal  Disease Immunological Denies history of Lupus Erythematosus, Raynaud s, Scleroderma Integumentary (Skin) Denies history  of History of Burn, History of pressure wounds Musculoskeletal Denies history of Gout, Rheumatoid Arthritis, Osteoarthritis, Osteomyelitis Neurologic Denies history of Dementia, Neuropathy, Quadriplegia, Paraplegia Oncologic Denies history of Received Chemotherapy, Received Radiation Psychiatric Denies history of Anorexia/bulimia, Confinement Anxiety Review of Systems (ROS) Constitutional Symptoms (General Health) Denies complaints or symptoms of Fever, Chills. Respiratory The patient has no complaints or symptoms. Cardiovascular The patient has no complaints or symptoms. Psychiatric The patient has no complaints or symptoms. Objective Constitutional Well-nourished and well-hydrated in no acute distress. Chamblin, Maddisen C. ([id number]) Vitals Time Taken: 10:55 AM, Height: 61 in, Weight: 100 lbs, BMI: 18.9, Temperature: 97.7 F, Pulse: 94 bpm, Respiratory Rate: 18 breaths/min, Blood Pressure: 136/63 mmHg. Respiratory normal breathing without difficulty. clear to auscultation bilaterally. Cardiovascular regular rate and rhythm with normal S1, S2. 1+ pitting edema of the bilateral lower extremities. Psychiatric this patient is able to make decisions and demonstrates good insight into disease process. Alert and Oriented x 3. pleasant and cooperative. General Notes: Patient's wound bed currently did have some Slough noted that no sharp debridement was performed simply due to the fact that I did not want to cause any worsening overall of the wounds themselves. In regard to pain that is. Nonetheless I think that the overall quality of the wound bed appears much better I do believe the [facility] Dressing beneficial in that regard. The only caveat is she did have some issues with the dressing getting stuck to the areas. Integumentary (Hair, Skin) Wound #1 status is  Open. Original cause of wound was Trauma. The wound is located on the Right,Anterior Lower Leg. The wound measures 2cm length x 2cm width x 0.2cm depth; 3.142cm^2 area and 0.628cm^3 volume. There is Fat Layer (Subcutaneous Tissue) Exposed exposed. There is no tunneling or undermining noted. There is a small amount of serous drainage noted. The wound margin is flat and intact. There is no granulation within the wound bed. There is a large (67-100%) amount of necrotic tissue within the wound bed including Adherent Slough. The periwound skin appearance exhibited: Hemosiderin Staining. The periwound skin appearance did not exhibit: Callus, Crepitus, Excoriation, Induration, Rash, Scarring, Dry/Scaly, Maceration, Atrophie Blanche, Cyanosis, Ecchymosis, Mottled, Pallor, Rubor, Erythema. Periwound temperature was noted as No Abnormality. The periwound has tenderness on palpation. Wound #2 status is Open. Original cause of wound was Trauma. The wound is located on the Left,Anterior Lower Leg. The wound measures 1.8cm length x 1.5cm width x 0.2cm depth; 2.121cm^2 area and 0.424cm^3 volume. There is Fat Layer (Subcutaneous Tissue) Exposed exposed. There is no tunneling or undermining noted. There is a small amount of serous drainage noted. The wound margin is flat and intact. There is no granulation within the wound bed. There is a medium (34- 66%) amount of necrotic tissue within the wound bed including Adherent Slough. The periwound skin appearance exhibited: Hemosiderin Staining. The periwound skin appearance did not exhibit: Callus, Crepitus, Excoriation, Induration, Rash, Scarring, Dry/Scaly, Maceration, Atrophie Blanche, Cyanosis, Ecchymosis, Mottled, Pallor, Rubor, Erythema. Periwound temperature was noted as No Abnormality. The periwound has tenderness on palpation. Assessment Active Problems ICD-10 Non-pressure chronic ulcer of other part of right lower leg with fat layer exposed Non-pressure  chronic ulcer of other part of left lower leg with fat layer exposed Unspecified open wound, left lower leg, initial encounter Unspecified open wound, right lower leg, initial encounter Paroxysmal atrial fibrillation Long term (current) use of anticoagulants Chronic kidney disease, stage 3 (moderate) Nold, Eydie C. ([id number]) Plan Wound Cleansing: Wound #1 Right,Anterior Lower  Leg: Clean wound with Normal Saline. May Shower, gently pat wound dry prior to applying new dressing. Wound #2 Left,Anterior Lower Leg: Clean wound with Normal Saline. May Shower, gently pat wound dry prior to applying new dressing. Primary Wound Dressing: Wound #1 Right,Anterior Lower Leg: Mupirocin Ointment - on wound bed Hydrafera Blue Ready Transfer - on top of mupirocin Wound #2 Left,Anterior Lower Leg: Mupirocin Ointment - on wound bed Hydrafera Blue Ready Transfer - on top of mupirocin Secondary Dressing: Wound #1 Right,Anterior Lower Leg: ABD and Kerlix/Conform Mepitel One or equivalent Wound #2 Left,Anterior Lower Leg: ABD and Kerlix/Conform Mepitel One or equivalent Dressing Change Frequency: Wound #1 Right,Anterior Lower Leg: Other: - twice weekly Wound #2 Left,Anterior Lower Leg: Other: - twice weekly Follow-up Appointments: Wound #1 Right,Anterior Lower Leg: Return Appointment in 1 week. Nurse Visit as needed - Monday or Tuesday Wound #2 Left,Anterior Lower Leg: Return Appointment in 1 week. Nurse Visit as needed - Monday or Tuesday Edema Control: Wound #1 Right,Anterior Lower Leg: Elevate legs to the level of the heart and pump ankles as often as possible Other: - tubigrip Wound #2 Left,Anterior Lower Leg: Elevate legs to the level of the heart and pump ankles as often as possible Other: - tubigrip I'm gonna suggest currently that we continue with the above wound care measures for the next week. Patient is in agreement with plan. We will do to some of the swelling that she's  experiencing at this point see about using Tubigrip I wanted to wrap that we did not really have ABI's to be able to do this and subsequently she has pain to the degree that I'm not sure would be able to perform the ABI's today. We will subsequently see were things stand at follow-up. Please see above for specific wound care orders. We will see patient for re-evaluation in 1 week(s) here in the clinic. If anything worsens or changes patient will contact our office for additional recommendations. Electronic Signature(s) Signed: 02/16/2018 3:37:27 PM By: Cephas Darby, Leather C. ([id number]) Entered By: Worthy Keeler on 02/14/2018 12:33:06 Norman, Tanna Savoy ([id number]) -------------------------------------------------------------------------------- ROS/PFSH Details Patient Name: Leidy, Onetta C. Date of Service: 02/14/2018 10:45 AM Medical Record Number: [id number] Patient Account Number: [id number] Date of Birth/Sex: 1931/04/09 (86 y.o. F) Treating RN: Montey Hora Primary Care Provider: Harrel Lemon Other Clinician: Referring Provider: Harrel Lemon Treating Provider/Extender: Melburn Hake, Jolynn Bajorek Weeks in Treatment: 4 Information Obtained From Patient Wound History Do you currently have one or more open woundso Yes How many open wounds do you currently haveo 2 Approximately how long have you had your woundso 8 weeks How have you been treating your wound(s) until nowo peroxide, neosporin and wound cleanser and bandage. Has your wound(s) ever healed and then re-openedo No Have you had any lab work done in the past montho No Have you tested positive for an antibiotic resistant organism No (MRSA, VRE)o Have you tested positive for osteomyelitis (bone infection)o No Have you had any tests for circulation on your legso No Constitutional Symptoms (General Health) Complaints and Symptoms: Negative for: Fever; Chills Eyes Medical History: Negative for: Cataracts; Glaucoma; Optic  Neuritis Ear/Nose/Mouth/Throat Medical History: Negative for: Chronic sinus problems/congestion; Middle ear problems Hematologic/Lymphatic Medical History: Negative for: Anemia; Hemophilia; Human Immunodeficiency Virus; Lymphedema; Sickle Cell Disease Respiratory Complaints and Symptoms: No Complaints or Symptoms Medical History: Negative for: Aspiration; Asthma; Chronic Obstructive Pulmonary Disease (COPD); Pneumothorax; Sleep Apnea; Tuberculosis Cardiovascular Complaints and Symptoms: No Complaints or Symptoms Clendenen, Deane C. (  [id number]) Medical History: Positive for: Congestive Heart Failure; Coronary Artery Disease Negative for: Angina; Arrhythmia; Deep Vein Thrombosis; Hypertension; Hypotension; Myocardial Infarction; Peripheral Arterial Disease; Peripheral Venous Disease; Phlebitis; Vasculitis Gastrointestinal Medical History: Negative for: Cirrhosis ; Colitis; Crohnos; Hepatitis A; Hepatitis B; Hepatitis C Endocrine Medical History: Negative for: Type I Diabetes; Type II Diabetes Genitourinary Medical History: Positive for: End Stage Renal Disease Immunological Medical History: Negative for: Lupus Erythematosus; Raynaudos; Scleroderma Integumentary (Skin) Medical History: Negative for: History of Burn; History of pressure wounds Musculoskeletal Medical History: Negative for: Gout; Rheumatoid Arthritis; Osteoarthritis; Osteomyelitis Neurologic Medical History: Negative for: Dementia; Neuropathy; Quadriplegia; Paraplegia Oncologic Medical History: Negative for: Received Chemotherapy; Received Radiation Psychiatric Complaints and Symptoms: No Complaints or Symptoms Medical History: Negative for: Anorexia/bulimia; Confinement Anxiety Immunizations Pneumococcal Vaccine: Received Pneumococcal Vaccination: Yes Implantable Devices Mccurley, Ireene C. ([id number]) Family and Social History Cancer: Yes - Mother; Diabetes: Yes - Siblings; Heart Disease: Yes -  Father,Siblings; Hereditary Spherocytosis: No; Hypertension: Yes - Siblings; Kidney Disease: Yes - Siblings; Lung Disease: No; Seizures: No; Never smoker; Marital Status - Married; Alcohol Use: Never; Drug Use: No History; Caffeine Use: Rarely; Financial Concerns: No; Food, Clothing or Shelter Needs: No; Support System Lacking: No; Transportation Concerns: No; Advanced Directives: Yes (Not Provided); Patient does not want information on Advanced Directives; Do not resuscitate: Yes (Not Provided); Living Will: Yes (Not Provided); Medical Power of Attorney: Yes (Not Provided) Physician Affirmation I have reviewed and agree with the above information. Electronic Signature(s) Signed: 02/14/2018 5:34:10 PM By: Montey Hora Signed: 02/16/2018 3:37:27 PM By: Worthy Keeler PA-C Entered By: Worthy Keeler on 02/14/2018 12:31:57 Kiper, Janisse C. ([id number]) -------------------------------------------------------------------------------- SuperBill Details Patient Name: Rosa, Talita C. Date of Service: 02/14/2018 Medical Record Number: [id number] Patient Account Number: [id number] Date of Birth/Sex: Dec 06, 1931 (85 y.o. F) Treating RN: Montey Hora Primary Care Provider: Harrel Lemon Other Clinician: Referring Provider: Harrel Lemon Treating Provider/Extender: Melburn Hake, Miyonna Orm[place] Weeks in Treatment: 4 Diagnosis Coding ICD-10 Codes Code Description 3322722465 Non-pressure chronic ulcer of other part of right lower leg with fat layer exposed L97.822 Non-pressure chronic ulcer of other part of left lower leg with fat layer exposed S81.802A Unspecified open wound, left lower leg, initial encounter S81.801A Unspecified open wound, right lower leg, initial encounter I48.0 Paroxysmal atrial fibrillation Z79.01 Long term (current) use of anticoagulants N18.3 Chronic kidney disease, stage 3 (moderate) Facility Procedures CPT4 Code: 69678938 Description: 99214 - WOUND CARE VISIT-LEV 4 EST  PT Modifier: Quantity: 1 Physician Procedures CPT4 Code Description: 1017510 99214 - WC PHYS LEVEL 4 - EST PT ICD-10 Diagnosis Description C58.527 Non-pressure chronic ulcer of other part of right lower leg w P82.423 Non-pressure chronic ulcer of other part of left lower leg wi S81.802A Unspecified  open wound, left lower leg, initial encounter S81.801A Unspecified open wound, right lower leg, initial encounter Modifier: ith fat layer expo th fat layer expos Quantity: 1 sed ed Electronic Signature(s) Signed: 02/16/2018 3:37:27 PM By: Worthy Keeler PA-C Entered By: Worthy Keeler on 02/14/2018 12:33:19

## 2018-02-19 NOTE — Progress Notes (Signed)
JASLYNN, THOME ([id number]) Visit Report for 02/14/2018 Arrival Information Details Patient Name: Elizabeth Fields, [name] Date of Service: 02/14/2018 10:45 AM Medical Record Number: [id number] Patient Account Number: [id number] Date of Birth/Sex: 1931/09/21 (86 y.o. F) Treating RN: Montey Hora Primary Care Kyliah Deanda: Harrel Lemon Other Clinician: Referring Tish Begin: Harrel Lemon Treating Marilene Vath/Extender: Melburn Hake, HOYT Weeks in Treatment: 4 Visit Information History Since Last Visit Added or deleted any medications: No Patient Arrived: Ambulatory Any new allergies or adverse reactions: No Arrival Time: 10:53 Had a fall or experienced change in No Accompanied By: self activities of daily living that may affect Transfer Assistance: None risk of falls: Patient Identification Verified: Yes Signs or symptoms of abuse/neglect since last visito No Secondary Verification Process Completed: Yes Hospitalized since last visit: No Implantable device outside of the clinic excluding No cellular tissue based products placed in the center since last visit: Has Dressing in Place as Prescribed: Yes Pain Present Now: Yes Electronic Signature(s) Signed: 02/14/2018 12:00:27 PM By: Lorine Bears RCP, RRT, CHT Entered By: Lorine Bears on 02/14/2018 10:55:42 Zuno, Ireene C. ([id number]) -------------------------------------------------------------------------------- Clinic Level of Care Assessment Details Patient Name: Yurkovich, Nereyda C. Date of Service: 02/14/2018 10:45 AM Medical Record Number: [id number] Patient Account Number: [id number] Date of Birth/Sex: 10-Apr-1931 (86 y.o. F) Treating RN: Montey Hora Primary Care Sakina Briones: Harrel Lemon Other Clinician: Referring Lyndee Herbst: Harrel Lemon Treating Unita Detamore/Extender: Melburn Hake, HOYT Weeks in Treatment: 4 Clinic Level of Care Assessment Items TOOL 4 Quantity Score []  - Use when only an EandM is performed on FOLLOW-UP visit  0 ASSESSMENTS - Nursing Assessment / Reassessment X - Reassessment of Co-morbidities (includes updates in patient status) 1 10 X- 1 5 Reassessment of Adherence to Treatment Plan ASSESSMENTS - Wound and Skin Assessment / Reassessment []  - Simple Wound Assessment / Reassessment - one wound 0 X- 2 5 Complex Wound Assessment / Reassessment - multiple wounds []  - 0 Dermatologic / Skin Assessment (not related to wound area) ASSESSMENTS - Focused Assessment X - Circumferential Edema Measurements - multi extremities 1 5 []  - 0 Nutritional Assessment / Counseling / Intervention X- 1 5 Lower Extremity Assessment (monofilament, tuning fork, pulses) []  - 0 Peripheral Arterial Disease Assessment (using hand held doppler) ASSESSMENTS - Ostomy and/or Continence Assessment and Care []  - Incontinence Assessment and Management 0 []  - 0 Ostomy Care Assessment and Management (repouching, etc.) PROCESS - Coordination of Care X - Simple Patient / Family Education for ongoing care 1 15 []  - 0 Complex (extensive) Patient / Family Education for ongoing care X- 1 10 Staff obtains Programmer, systems, Records, Test Results / Process Orders []  - 0 Staff telephones HHA, Nursing Homes / Clarify orders / etc []  - 0 Routine Transfer to another Facility (non-emergent condition) []  - 0 Routine Hospital Admission (non-emergent condition) []  - 0 New Admissions / Biomedical engineer / Ordering NPWT, Apligraf, etc. []  - 0 Emergency Hospital Admission (emergent condition) X- 1 10 Simple Discharge Coordination Caver, Ronan C. ([id number]) []  - 0 Complex (extensive) Discharge Coordination PROCESS - Special Needs []  - Pediatric / Minor Patient Management 0 []  - 0 Isolation Patient Management []  - 0 Hearing / Language / Visual special needs []  - 0 Assessment of Community assistance (transportation, D/C planning, etc.) []  - 0 Additional assistance / Altered mentation []  - 0 Support Surface(s) Assessment (bed,  cushion, seat, etc.) INTERVENTIONS - Wound Cleansing / Measurement []  - Simple Wound Cleansing - one wound 0 X- 2 5 Complex Wound Cleansing - multiple wounds X-  1 5 Wound Imaging (photographs - any number of wounds) []  - 0 Wound Tracing (instead of photographs) []  - 0 Simple Wound Measurement - one wound X- 2 5 Complex Wound Measurement - multiple wounds INTERVENTIONS - Wound Dressings X - Small Wound Dressing one or multiple wounds 2 10 []  - 0 Medium Wound Dressing one or multiple wounds []  - 0 Large Wound Dressing one or multiple wounds X- 1 5 Application of Medications - topical []  - 0 Application of Medications - injection INTERVENTIONS - Miscellaneous []  - External ear exam 0 []  - 0 Specimen Collection (cultures, biopsies, blood, body fluids, etc.) []  - 0 Specimen(s) / Culture(s) sent or taken to Lab for analysis []  - 0 Patient Transfer (multiple staff / Civil Service fast streamer / Similar devices) []  - 0 Simple Staple / Suture removal (25 or less) []  - 0 Complex Staple / Suture removal (26 or more) []  - 0 Hypo / Hyperglycemic Management (close monitor of Blood Glucose) []  - 0 Ankle / Brachial Index (ABI) - do not check if billed separately X- 1 5 Vital Signs Hosang, Kamara C. ([id number]) Has the patient been seen at the hospital within the last three years: Yes Total Score: 125 Level Of Care: New/Established - Level 4 Electronic Signature(s) Signed: 02/14/2018 5:34:10 PM By: Montey Hora Entered By: Montey Hora on 02/14/2018 12:25:51 Dierolf, Makaiah C. ([id number]) -------------------------------------------------------------------------------- Encounter Discharge Information Details Patient Name: Tripathi, Jacole C. Date of Service: 02/14/2018 10:45 AM Medical Record Number: [id number] Patient Account Number: [id number] Date of Birth/Sex: 01/27/1931 (86 y.o. F) Treating RN: Montey Hora Primary Care Less Woolsey: Harrel Lemon Other Clinician: Referring Talan Gildner: Harrel Lemon Treating Jaylynne Birkhead/Extender: Melburn Hake, HOYT Weeks in Treatment: 4 Encounter Discharge Information Items Discharge Condition: Stable Ambulatory Status: Ambulatory Discharge Destination: Home Transportation: Private Auto Accompanied By: spouse Schedule Follow-up Appointment: Yes Clinical Summary of Care: Electronic Signature(s) Signed: 02/14/2018 12:28:35 PM By: Montey Hora Entered By: Montey Hora on 02/14/2018 12:28:35 Boster, Manami Loletha Grayer ([id number]) -------------------------------------------------------------------------------- Lower Extremity Assessment Details Patient Name: Gouge, Makynzie C. Date of Service: 02/14/2018 10:45 AM Medical Record Number: [id number] Patient Account Number: [id number] Date of Birth/Sex: 04/25/1931 (86 y.o. F) Treating RN: Army Melia Primary Care Eilleen Davoli: Harrel Lemon Other Clinician: Referring Jaime Dome: Harrel Lemon Treating Taiten Brawn/Extender: Melburn Hake, HOYT Weeks in Treatment: 4 Edema Assessment Assessed: [Left: No] [Right: No] [Left: Edema] [Right: :] Calf Left: Right: Point of Measurement: 26 cm From Medial Instep 30 cm 30.5 cm Ankle Left: Right: Point of Measurement: 11 cm From Medial Instep 21.5 cm 21 cm Vascular Assessment Pulses: Dorsalis Pedis Palpable: [Left:Yes] [Right:Yes] Posterior Tibial Extremity colors, hair growth, and conditions: Extremity Color: [Left:Red] [Right:Hyperpigmented] Hair Growth on Extremity: [Left:No] [Right:No] Temperature of Extremity: [Left:Warm] [Right:Warm] Capillary Refill: [Left:< 3 seconds] [Right:< 3 seconds] Toe Nail Assessment Left: Right: Thick: No No Discolored: No No Deformed: No No Improper Length and Hygiene: No No Electronic Signature(s) Signed: 02/17/2018 8:00:18 AM By: Army Melia Entered By: Army Melia on 02/14/2018 11:12:34 Mongillo, Fatma C. ([id number]) -------------------------------------------------------------------------------- Multi Wound Chart Details Patient Name:  Couchman, Anahlia C. Date of Service: 02/14/2018 10:45 AM Medical Record Number: [id number] Patient Account Number: [id number] Date of Birth/Sex: May 07, 1931 (86 y.o. F) Treating RN: Cornell Barman Primary Care Lillion Elbert: Harrel Lemon Other Clinician: Referring Jase Reep: Harrel Lemon Treating Gio Janoski/Extender: Melburn Hake, HOYT Weeks in Treatment: 4 Vital Signs Height(in): 61 Pulse(bpm): 94 Weight(lbs): 100 Blood Pressure(mmHg): 136/63 Body Mass Index(BMI): 19 Temperature(F): 97.7 Respiratory Rate 18 (breaths/min): Photos: [1:No Photos] [2:No Photos] [N/A:N/A] Wound Location: [1:Right Lower  Leg - Anterior] [2:Left Lower Leg - Anterior] [N/A:N/A] Wounding Event: [1:Trauma] [2:Trauma] [N/A:N/A] Primary Etiology: [1:Trauma, Other] [2:Trauma, Other] [N/A:N/A] Comorbid History: [1:Congestive Heart Failure, Coronary Artery Disease, End Stage Renal Disease] [2:Congestive Heart Failure, Coronary Artery Disease, End Stage Renal Disease] [N/A:N/A] Date Acquired: [1:12/26/2017] [2:11/17/2017] [N/A:N/A] Weeks of Treatment: [1:4] [2:4] [N/A:N/A] Wound Status: [1:Open] [2:Open] [N/A:N/A] Measurements L x W x D [1:2x2x0.2] [2:1.8x1.5x0.2] [N/A:N/A] (cm) Area (cm) : [1:3.142] [2:2.121] [N/A:N/A] Volume (cm) : [1:0.628] [2:0.424] [N/A:N/A] % Reduction in Area: [1:-21.20%] [2:-237.70%] [N/A:N/A] % Reduction in Volume: [1:-142.50%] [2:-573.00%] [N/A:N/A] Classification: [1:Full Thickness Without Exposed Support Structures] [2:Full Thickness Without Exposed Support Structures] [N/A:N/A] Exudate Amount: [1:Small] [2:Small] [N/A:N/A] Exudate Type: [1:Serous] [2:Serous] [N/A:N/A] Exudate Color: [1:amber] [2:amber] [N/A:N/A] Wound Margin: [1:Flat and Intact] [2:Flat and Intact] [N/A:N/A] Granulation Amount: [1:None Present (0%)] [2:None Present (0%)] [N/A:N/A] Necrotic Amount: [1:Large (67-100%)] [2:Medium (34-66%)] [N/A:N/A] Exposed Structures: [1:Fat Layer (Subcutaneous Tissue) Exposed: Yes Fascia: No  Tendon: No Muscle: No Joint: No Bone: No] [2:Fat Layer (Subcutaneous Tissue) Exposed: Yes Fascia: No Tendon: No Muscle: No Joint: No Bone: No] [N/A:N/A] Epithelialization: [1:None] [2:None] [N/A:N/A] Periwound Skin Texture: [1:Excoriation: No Induration: No Callus: No Crepitus: No] [2:Excoriation: No Induration: No Callus: No Crepitus: No] [N/A:N/A] Rash: No Rash: No Scarring: No Scarring: No Periwound Skin Moisture: Maceration: No Maceration: No N/A Dry/Scaly: No Dry/Scaly: No Periwound Skin Color: Hemosiderin Staining: Yes Hemosiderin Staining: Yes N/A Atrophie Blanche: No Atrophie Blanche: No Cyanosis: No Cyanosis: No Ecchymosis: No Ecchymosis: No Erythema: No Erythema: No Mottled: No Mottled: No Pallor: No Pallor: No Rubor: No Rubor: No Temperature: No Abnormality No Abnormality N/A Tenderness on Palpation: Yes Yes N/A Wound Preparation: Ulcer Cleansing: Ulcer Cleansing: N/A Rinsed/Irrigated with Saline Rinsed/Irrigated with Saline Topical Anesthetic Applied: Topical Anesthetic Applied: Other: lidocaine 4% Other: lidocaine 4% Treatment Notes Electronic Signature(s) Signed: 02/18/2018 11:53:49 AM By: Gretta Cool, BSN, RN, CWS, Kim RN, BSN Entered By: Gretta Cool, BSN, RN, CWS, Kim on 02/14/2018 12:03:51 Catalina Pizza ([id number]) -------------------------------------------------------------------------------- Multi-Disciplinary Care Plan Details Patient Name: Justman, Elvia C. Date of Service: 02/14/2018 10:45 AM Medical Record Number: [id number] Patient Account Number: [id number] Date of Birth/Sex: 1931-05-03 (86 y.o. F) Treating RN: Cornell Barman Primary Care Oluwatoni Rotunno: Harrel Lemon Other Clinician: Referring Govanni Plemons: Harrel Lemon Treating Sailor Haughn/Extender: Melburn Hake, HOYT Weeks in Treatment: 4 Active Inactive Abuse / Safety / Falls / Self Care Management Nursing Diagnoses: Potential for falls Goals: Patient will not experience any injury related to falls Date  Initiated: 01/17/2018 Target Resolution Date: 04/12/2018 Goal Status: Active Interventions: Assess fall risk on admission and as needed Notes: Orientation to the Wound Care Program Nursing Diagnoses: Knowledge deficit related to the wound healing center program Goals: Patient/caregiver will verbalize understanding of the [place] Program Date Initiated: 01/17/2018 Target Resolution Date: 04/12/2018 Goal Status: Active Interventions: Provide education on orientation to the wound center Notes: Wound/Skin Impairment Nursing Diagnoses: Impaired tissue integrity Goals: Ulcer/skin breakdown will heal within 14 weeks Date Initiated: 01/17/2018 Target Resolution Date: 04/12/2018 Goal Status: Active Interventions: Assess patient/caregiver ability to obtain necessary supplies Pineiro, Renelda C. ([id number]) Assess patient/caregiver ability to perform ulcer/skin care regimen upon admission and as needed Assess ulceration(s) every visit Notes: Electronic Signature(s) Signed: 02/18/2018 11:53:49 AM By: Gretta Cool, BSN, RN, CWS, Kim RN, BSN Entered By: Gretta Cool, BSN, RN, CWS, Kim on 02/14/2018 12:03:45 Inclan, Brinklee Loletha Grayer ([id number]) -------------------------------------------------------------------------------- Pain Assessment Details Patient Name: Mcclanahan, Keirah C. Date of Service: 02/14/2018 10:45 AM Medical Record Number: [id number] Patient Account Number: [id number] Date of Birth/Sex: [birth year] (86 y.o. F)  Treating RN: Montey Hora Primary Care Cumi Sanagustin: Harrel Lemon Other Clinician: Referring Iriana Artley: Harrel Lemon Treating Shirelle Tootle/Extender: Melburn Hake, HOYT Weeks in Treatment: 4 Active Problems Location of Pain Severity and Description of Pain Patient Has Paino Yes Site Locations Rate the pain. Current Pain Level: 10 Pain Management and Medication Current Pain Management: Electronic Signature(s) Signed: 02/14/2018 12:00:27 PM By: Lorine Bears RCP, RRT, CHT Signed:  02/14/2018 5:34:10 PM By: Montey Hora Entered By: Lorine Bears on 02/14/2018 10:56:50 Rocchi, Lyra CMarland Kitchen ([id number]) -------------------------------------------------------------------------------- Patient/Caregiver Education Details Patient Name: Masoud, Glenice C. Date of Service: 02/14/2018 10:45 AM Medical Record Number: [id number] Patient Account Number: [id number] Date of Birth/Gender: 1931-11-24 (85 y.o. F) Treating RN: Montey Hora Primary Care Physician: Harrel Lemon Other Clinician: Referring Physician: Harrel Lemon Treating Physician/Extender: Sharalyn Ink in Treatment: 4 Education Assessment Education Provided To: Patient Education Topics Provided Venous: Handouts: Other: leg elevation Methods: Explain/Verbal Responses: State content correctly Electronic Signature(s) Signed: 02/14/2018 5:34:10 PM By: Montey Hora Entered By: Montey Hora on 02/14/2018 12:29:48 Daniello, Kalanie C. ([id number]) -------------------------------------------------------------------------------- Wound Assessment Details Patient Name: Agent, Mikaila C. Date of Service: 02/14/2018 10:45 AM Medical Record Number: [id number] Patient Account Number: [id number] Date of Birth/Sex: 26-Nov-1931 (86 y.o. F) Treating RN: Army Melia Primary Care Lakai Moree: Harrel Lemon Other Clinician: Referring Debbora Ang: Harrel Lemon Treating Maysen Sudol/Extender: Melburn Hake, HOYT Weeks in Treatment: 4 Wound Status Wound Number: 1 Primary Trauma, Other Etiology: Wound Location: Right Lower Leg - Anterior Wound Open Wounding Event: Trauma Status: Date Acquired: 12/26/2017 Comorbid Congestive Heart Failure, Coronary Artery Weeks Of Treatment: 4 History: Disease, End Stage Renal Disease Clustered Wound: No Photos Photo Uploaded By: Army Melia on 02/14/2018 12:05:47 Wound Measurements Length: (cm) 2 Width: (cm) 2 Depth: (cm) 0.2 Area: (cm) 3.142 Volume: (cm) 0.628 % Reduction in Area:  -21.2% % Reduction in Volume: -142.5% Epithelialization: None Tunneling: No Undermining: No Wound Description Full Thickness Without Exposed Support Classification: Structures Wound Margin: Flat and Intact Exudate Small Amount: Exudate Type: Serous Exudate Color: amber Foul Odor After Cleansing: No Slough/Fibrino Yes Wound Bed Granulation Amount: None Present (0%) Exposed Structure Necrotic Amount: Large (67-100%) Fascia Exposed: No Necrotic Quality: Adherent Slough Fat Layer (Subcutaneous Tissue) Exposed: Yes Tendon Exposed: No Muscle Exposed: No Joint Exposed: No Bone Exposed: No Searls, Tinlee C. ([id number]) Periwound Skin Texture Texture Color No Abnormalities Noted: No No Abnormalities Noted: No Callus: No Atrophie Blanche: No Crepitus: No Cyanosis: No Excoriation: No Ecchymosis: No Induration: No Erythema: No Rash: No Hemosiderin Staining: Yes Scarring: No Mottled: No Pallor: No Moisture Rubor: No No Abnormalities Noted: No Dry / Scaly: No Temperature / Pain Maceration: No Temperature: No Abnormality Tenderness on Palpation: Yes Wound Preparation Ulcer Cleansing: Rinsed/Irrigated with Saline Topical Anesthetic Applied: Other: lidocaine 4%, Electronic Signature(s) Signed: 02/17/2018 8:00:18 AM By: Army Melia Entered By: Army Melia on 02/14/2018 11:08:01 Odonnel, Caral C. ([id number]) -------------------------------------------------------------------------------- Wound Assessment Details Patient Name: Griego, Arnika C. Date of Service: 02/14/2018 10:45 AM Medical Record Number: [id number] Patient Account Number: [id number] Date of Birth/Sex: Sep 13, 1931 (86 y.o. F) Treating RN: Army Melia Primary Care Samanta Gal: Harrel Lemon Other Clinician: Referring Carnesha Maravilla: Harrel Lemon Treating Janijah Symons/Extender: Melburn Hake, HOYT Weeks in Treatment: 4 Wound Status Wound Number: 2 Primary Trauma, Other Etiology: Wound Location: Left Lower Leg -  Anterior Wound Open Wounding Event: Trauma Status: Date Acquired: 11/17/2017 Comorbid Congestive Heart Failure, Coronary Artery Weeks Of Treatment: 4 History: Disease, End Stage Renal Disease Clustered Wound: No Photos Photo Uploaded By: Army Melia on 02/14/2018 14:13:16 Wound  Measurements Length: (cm) 1.8 Width: (cm) 1.5 Depth: (cm) 0.2 Area: (cm) 2.121 Volume: (cm) 0.424 % Reduction in Area: -237.7% % Reduction in Volume: -573% Epithelialization: None Tunneling: No Undermining: No Wound Description Full Thickness Without Exposed Support Classification: Structures Wound Margin: Flat and Intact Exudate Small Amount: Exudate Type: Serous Exudate Color: amber Foul Odor After Cleansing: No Slough/Fibrino Yes Wound Bed Granulation Amount: None Present (0%) Exposed Structure Necrotic Amount: Medium (34-66%) Fascia Exposed: No Necrotic Quality: Adherent Slough Fat Layer (Subcutaneous Tissue) Exposed: Yes Tendon Exposed: No Muscle Exposed: No Joint Exposed: No Bone Exposed: No Stogdill, Oksana C. ([id number]) Periwound Skin Texture Texture Color No Abnormalities Noted: No No Abnormalities Noted: No Callus: No Atrophie Blanche: No Crepitus: No Cyanosis: No Excoriation: No Ecchymosis: No Induration: No Erythema: No Rash: No Hemosiderin Staining: Yes Scarring: No Mottled: No Pallor: No Moisture Rubor: No No Abnormalities Noted: No Dry / Scaly: No Temperature / Pain Maceration: No Temperature: No Abnormality Tenderness on Palpation: Yes Wound Preparation Ulcer Cleansing: Rinsed/Irrigated with Saline Topical Anesthetic Applied: Other: lidocaine 4%, Electronic Signature(s) Signed: 02/17/2018 8:00:18 AM By: Army Melia Entered By: Army Melia on 02/14/2018 11:08:45 Downie, Shaunda C. ([id number]) -------------------------------------------------------------------------------- Vitals Details Patient Name: Mendolia, Tiffiney C. Date of Service: 02/14/2018 10:45  AM Medical Record Number: [id number] Patient Account Number: [id number] Date of Birth/Sex: 26-Nov-1931 (86 y.o. F) Treating RN: Montey Hora Primary Care Marveen Donlon: Harrel Lemon Other Clinician: Referring Ellaina Schuler: Harrel Lemon Treating Alaynah Schutter/Extender: Melburn Hake, HOYT Weeks in Treatment: 4 Vital Signs Time Taken: 10:55 Temperature (F): 97.7 Height (in): 61 Pulse (bpm): 94 Weight (lbs): 100 Respiratory Rate (breaths/min): 18 Body Mass Index (BMI): 18.9 Blood Pressure (mmHg): 136/63 Reference Range: 80 - 120 mg / dl Airway Electronic Signature(s) Signed: 02/14/2018 12:00:27 PM By: Lorine Bears RCP, RRT, CHT Entered By: Lorine Bears on 02/14/2018 10:57:28

## 2018-02-21 ENCOUNTER — Other Ambulatory Visit
Admission: RE | Admit: 2018-02-21 | Discharge: 2018-02-21 | Disposition: A | Discharge status: Home / Self Care | Payer: PPO | Attending: Physician Assistant | Admitting: Physician Assistant

## 2018-02-21 ENCOUNTER — Encounter: Payer: PPO | Admitting: Physician Assistant

## 2018-02-21 DIAGNOSIS — B999 Unspecified infectious disease: Secondary | ICD-10-CM | POA: Insufficient documentation

## 2018-02-21 DIAGNOSIS — Z7901 Long term (current) use of anticoagulants: Secondary | ICD-10-CM | POA: Diagnosis not present

## 2018-02-21 DIAGNOSIS — L97812 Non-pressure chronic ulcer of other part of right lower leg with fat layer exposed: Secondary | ICD-10-CM | POA: Diagnosis not present

## 2018-02-21 DIAGNOSIS — I480 Paroxysmal atrial fibrillation: Secondary | ICD-10-CM | POA: Diagnosis not present

## 2018-02-21 DIAGNOSIS — L97822 Non-pressure chronic ulcer of other part of left lower leg with fat layer exposed: Secondary | ICD-10-CM | POA: Diagnosis not present

## 2018-02-21 DIAGNOSIS — L089 Local infection of the skin and subcutaneous tissue, unspecified: Secondary | ICD-10-CM | POA: Diagnosis not present

## 2018-02-21 DIAGNOSIS — S81802A Unspecified open wound, left lower leg, initial encounter: Secondary | ICD-10-CM | POA: Diagnosis not present

## 2018-02-21 DIAGNOSIS — N183 Chronic kidney disease, stage 3 (moderate): Secondary | ICD-10-CM | POA: Diagnosis not present

## 2018-02-21 DIAGNOSIS — S81801A Unspecified open wound, right lower leg, initial encounter: Secondary | ICD-10-CM | POA: Diagnosis not present

## 2018-02-24 DIAGNOSIS — E785 Hyperlipidemia, unspecified: Secondary | ICD-10-CM | POA: Diagnosis not present

## 2018-02-24 DIAGNOSIS — N183 Chronic kidney disease, stage 3 (moderate): Secondary | ICD-10-CM | POA: Diagnosis not present

## 2018-02-24 DIAGNOSIS — E034 Atrophy of thyroid (acquired): Secondary | ICD-10-CM | POA: Diagnosis not present

## 2018-02-24 DIAGNOSIS — D519 Vitamin B12 deficiency anemia, unspecified: Secondary | ICD-10-CM | POA: Diagnosis not present

## 2018-02-24 NOTE — Progress Notes (Signed)
LUNDYN, COSTE ([id number]) Visit Report for 02/21/2018 Chief Complaint Document Details Patient Name: [name] Date of Service: 02/21/2018 2:00 PM Medical Record Number: [id number] Patient Account Number: [id number] Date of Birth/Sex: [birth year] (86 y.o. F) Treating RN: Harold Barban Primary Care Provider: Harrel Lemon Other Clinician: Referring Provider: Harrel Lemon Treating Provider/Extender: Melburn Hake, HOYT Weeks in Treatment: 5 Information Obtained from: Patient Chief Complaint Bilateral LE Ulcers Electronic Signature(s) Signed: 02/23/2018 7:15:48 AM By: Worthy Keeler PA-Fields Entered By: Worthy Keeler on 02/21/2018 14:07:26 Elizabeth Fields, [name] ([id number]) -------------------------------------------------------------------------------- HPI Details Patient Name: [name] Date of Service: 02/21/2018 2:00 PM Medical Record Number: [id number] Patient Account Number: [id number] Date of Birth/Sex: [birth year] (86 y.o. F) Treating RN: Harold Barban Primary Care Provider: Harrel Lemon Other Clinician: Referring Provider: Harrel Lemon Treating Provider/Extender: Melburn Hake, HOYT Weeks in Treatment: 5 History of Present Illness HPI Description: 01/17/18 on evaluation today patient presents for evaluation of two ulcers both on the anterior portion of her lower extremities one right and one left. The right she has had for about three weeks the left for about eight weeks and both occurred as a result of her traumatizing her shins on the bottom of the card were trying to get out of the car. She states this is never happen with any other car doors before but she has been having some issues with this sharp area on the store in particular. Both occurred in the similar situation where she was trying not to open the door the whole way in order to avoid damaging anybody else's car beside them. She is been using peroxide initially to clean the wound although she has converted to the  wound cleanser now. She has been using if anything just over the counter triple antibiotic ointment and a Band-Aid. With that being said she's not even been using that more recently. The most part she's just been attempting allow this to dry out/scab over. Her APIs were greater than 220 and noncompressible. Patient does have a history of paroxysmal atrial fibrillation for which she is on Coumadin. She also has chronic kidney disease stage III. She has been on antibiotics that I see no evidence of infection right now which is good news. 01/24/18 on evaluation today patient appears to be doing excellent in regard to her lower extremity ulcers. She has been tolerating the dressing changes without complication which is excellent news. Overall I have been very pleased with her progress. Nonetheless she still has have some ways to go before this will be completely healed. The left is a little bit better in appearance compared to the right which is somewhat deeper. 01/31/18 on evaluation today patient appears to be doing better in regard to her bilateral anterior lower extremity ulcers. She has been tolerating the dressing changes without complication which is excellent news. Fortunately there does not appear to be any evidence of infection at this time which is also good news. I'm very pleased with how things seem to be progressing at this point. The patient likewise is very happy and states she's not having the pain that she was having previous. 02/07/18 on evaluation today patient appears to be doing rather well in regard to her ulcers. The left especially seems to be making good progress in the right does have more slough and poor granulation tissue on the surface at this point. I think this is gonna require little bit more extensive debridement. Fortunately there's no signs of infection at this time. 02/14/18  on evaluation today patient appears to be doing better in the overall appearance of the wounds  although she tells me she's been having more burning over the past week. There appears to be some adhesive irritation. That has me somewhat concerned as far as the reason for the burning is concerned. With that being said I do not see any signs of actual infection which is good news. No fevers, chills, nausea, or vomiting noted at this time. Overall there does not appear to be any sign that there is worsening to me and the only reason the right extremity wound is bigger is that I actually performed a fairly significant debridement last week. That maybe some of the reason why she's had a little bit more pain as well. 02/21/18 on evaluation today patient appears to be doing poorly in regard to her lower extremity ulcers unfortunately. She continues to have discomfort despite the dressing switch I don't think the dressing is the issue I believe she may actually have an infection and that could be the problem that we're facing at this point. Fortunately there's no signs of systemic infection. No fevers, chills, nausea, or vomiting noted at this time. Unfortunately I'm just not seeing the progress that I would expect especially after cleaning the wounds off sufficiently. Electronic Signature(s) Signed: 02/23/2018 7:15:48 AM By: Worthy Keeler PA-Fields Entered By: Worthy Keeler on 02/21/2018 14:59:20 Elizabeth Fields, [name] ([id number]) -------------------------------------------------------------------------------- Physical Exam Details Patient Name: [name] Date of Service: 02/21/2018 2:00 PM Medical Record Number: [id number] Patient Account Number: [id number] Date of Birth/Sex: 1931-01-26 (86 y.o. F) Treating RN: Harold Barban Primary Care Provider: Harrel Lemon Other Clinician: Referring Provider: Harrel Lemon Treating Provider/Extender: Melburn Hake, HOYT Weeks in Treatment: 5 Constitutional Thin and well-hydrated in no acute distress. Respiratory normal breathing without difficulty. clear to  auscultation bilaterally. Cardiovascular regular rate and rhythm with normal S1, S2. Psychiatric this patient is able to make decisions and demonstrates good insight into disease process. Alert and Oriented x 3. pleasant and cooperative. Notes Due to the pain no sharp debridement was performed today as the patient did not want me to do anything at this point. I'm definitely okay with avoiding the debridement at this time her swelling seems to be doing fairly well she also would prefer not to have the wrap like we did last time again I she states the pressure pushing on it seemed to make it burn and hurt worse. She still has Circle noted on the surface of the wounds especially the right lower extremity. This also sniffing everything in maceration around the edge of the wound and I did look at the dressing there was purulent drainage noted. I'm gonna culture this wound on the fray that she may actually have an infection. Electronic Signature(s) Signed: 02/23/2018 7:15:48 AM By: Worthy Keeler PA-Fields Entered By: Worthy Keeler on 02/21/2018 15:00:43 Elizabeth Fields, Elizabeth Fields ([id number]) -------------------------------------------------------------------------------- Physician Orders Details Patient Name: Quiroa, Cherylee Fields. Date of Service: 02/21/2018 2:00 PM Medical Record Number: [id number] Patient Account Number: [id number] Date of Birth/Sex: Oct 24, 1931 (86 y.o. F) Treating RN: Harold Barban Primary Care Provider: Harrel Lemon Other Clinician: Referring Provider: Harrel Lemon Treating Provider/Extender: Melburn Hake, HOYT Weeks in Treatment: 5 Verbal / Phone Orders: No Diagnosis Coding ICD-10 Coding Code Description [phone] Non-pressure chronic ulcer of other part of right lower leg with fat layer exposed L97.822 Non-pressure chronic ulcer of other part of left lower leg with fat layer exposed S81.802A Unspecified open wound, left lower  leg, initial encounter S81.801A Unspecified open wound, right  lower leg, initial encounter I48.0 Paroxysmal atrial fibrillation Z79.01 Long term (current) use of anticoagulants N18.3 Chronic kidney disease, stage 3 (moderate) Wound Cleansing Wound #1 Right,Anterior Lower Leg o May Shower, gently pat wound dry prior to applying new dressing. - Clean with soap and water Wound #2 Left,Anterior Lower Leg o May Shower, gently pat wound dry prior to applying new dressing. - Clean with soap and water Primary Wound Dressing Wound #1 Right,Anterior Lower Leg o Silver Alginate Wound #2 Left,Anterior Lower Leg o Silver Alginate Secondary Dressing Wound #1 Right,Anterior Lower Leg o ABD and Kerlix/Conform - Fish net Wound #2 Left,Anterior Lower Leg o ABD and Kerlix/Conform - Fish net Dressing Change Frequency Wound #1 Right,Anterior Lower Leg o Change Dressing Monday, Wednesday, Friday Wound #2 Left,Anterior Lower Leg o Change Dressing Monday, Wednesday, Friday Follow-up Appointments Wound #1 Right,Anterior Lower Leg o Return Appointment in 1 week. Elizabeth Fields, Elizabeth Fields ([id number]) Edema Control Wound #1 Right,Anterior Lower Leg o Elevate legs to the level of the heart and pump ankles as often as possible Medications-please add to medication list. Wound #1 Right,Anterior Lower Leg o P.O. Antibiotics - Take and finish all antibiotics prescribed Wound #2 Left,Anterior Lower Leg o P.O. Antibiotics - Take and finish all antibiotics prescribed Laboratory o Bacteria identified in Wound by Culture (MICRO) - Left lower leg oooo LOINC Code: 8811-0 oooo Convenience Name: Wound culture routine Patient Medications Allergies: No Known Allergies Notifications Medication Indication Start End doxycycline hyclate 02/21/2018 DOSE 1 - oral 100 mg capsule - 1 capsule oral taken 2 times a day for 14 days Electronic Signature(s) Signed: 02/21/2018 3:04:55 PM By: Worthy Keeler PA-Fields Entered By: Worthy Keeler on 02/21/2018 15:04:55 Elizabeth Fields, Elizabeth  Fields. ([id number]) -------------------------------------------------------------------------------- Problem List Details Patient Name: Pevehouse, Brandee Fields. Date of Service: 02/21/2018 2:00 PM Medical Record Number: [id number] Patient Account Number: [id number] Date of Birth/Sex: 07-Mar-1931 (86 y.o. F) Treating RN: Harold Barban Primary Care Provider: Harrel Lemon Other Clinician: Referring Provider: Harrel Lemon Treating Provider/Extender: Melburn Hake, HOYT Weeks in Treatment: 5 Active Problems ICD-10 Evaluated Encounter Code Description Active Date Today Diagnosis L97.812 Non-pressure chronic ulcer of other part of right lower leg 01/17/2018 No Yes with fat layer exposed L97.822 Non-pressure chronic ulcer of other part of left lower leg with 01/17/2018 No Yes fat layer exposed S81.802A Unspecified open wound, left lower leg, initial encounter 01/17/2018 No Yes S81.801A Unspecified open wound, right lower leg, initial encounter 01/17/2018 No Yes I48.0 Paroxysmal atrial fibrillation 01/17/2018 No Yes Z79.01 Long term (current) use of anticoagulants 01/17/2018 No Yes N18.3 Chronic kidney disease, stage 3 (moderate) 01/17/2018 No Yes Inactive Problems Resolved Problems Electronic Signature(s) Signed: 02/23/2018 7:15:48 AM By: Worthy Keeler PA-Fields Entered By: Worthy Keeler on 02/21/2018 14:07:20 Elizabeth Fields, [name] ([id number]) -------------------------------------------------------------------------------- Progress Note Details Patient Name: Westervelt, Roselyn Fields. Date of Service: 02/21/2018 2:00 PM Medical Record Number: [id number] Patient Account Number: [id number] Date of Birth/Sex: [birth year] (86 y.o. F) Treating RN: Harold Barban Primary Care Provider: Harrel Lemon Other Clinician: Referring Provider: Harrel Lemon Treating Provider/Extender: Melburn Hake, HOYT Weeks in Treatment: 5 Subjective Chief Complaint Information obtained from Patient Bilateral LE Ulcers History of Present Illness  (HPI) 01/17/18 on evaluation today patient presents for evaluation of two ulcers both on the anterior portion of her lower extremities one right and one left. The right she has had for about three weeks the left for about eight weeks and both occurred as a result of  her traumatizing her shins on the bottom of the card were trying to get out of the car. She states this is never happen with any other car doors before but she has been having some issues with this sharp area on the store in particular. Both occurred in the similar situation where she was trying not to open the door the whole way in order to avoid damaging anybody else's car beside them. She is been using peroxide initially to clean the wound although she has converted to the wound cleanser now. She has been using if anything just over the counter triple antibiotic ointment and a Band-Aid. With that being said she's not even been using that more recently. The most part she's just been attempting allow this to dry out/scab over. Her APIs were greater than 220 and noncompressible. Patient does have a history of paroxysmal atrial fibrillation for which she is on Coumadin. She also has chronic kidney disease stage III. She has been on antibiotics that I see no evidence of infection right now which is good news. 01/24/18 on evaluation today patient appears to be doing excellent in regard to her lower extremity ulcers. She has been tolerating the dressing changes without complication which is excellent news. Overall I have been very pleased with her progress. Nonetheless she still has have some ways to go before this will be completely healed. The left is a little bit better in appearance compared to the right which is somewhat deeper. 01/31/18 on evaluation today patient appears to be doing better in regard to her bilateral anterior lower extremity ulcers. She has been tolerating the dressing changes without complication which is excellent news.  Fortunately there does not appear to be any evidence of infection at this time which is also good news. I'm very pleased with how things seem to be progressing at this point. The patient likewise is very happy and states she's not having the pain that she was having previous. 02/07/18 on evaluation today patient appears to be doing rather well in regard to her ulcers. The left especially seems to be making good progress in the right does have more slough and poor granulation tissue on the surface at this point. I think this is gonna require little bit more extensive debridement. Fortunately there's no signs of infection at this time. 02/14/18 on evaluation today patient appears to be doing better in the overall appearance of the wounds although she tells me she's been having more burning over the past week. There appears to be some adhesive irritation. That has me somewhat concerned as far as the reason for the burning is concerned. With that being said I do not see any signs of actual infection which is good news. No fevers, chills, nausea, or vomiting noted at this time. Overall there does not appear to be any sign that there is worsening to me and the only reason the right extremity wound is bigger is that I actually performed a fairly significant debridement last week. That maybe some of the reason why she's had a little bit more pain as well. 02/21/18 on evaluation today patient appears to be doing poorly in regard to her lower extremity ulcers unfortunately. She continues to have discomfort despite the dressing switch I don't think the dressing is the issue I believe she may actually have an infection and that could be the problem that we're facing at this point. Fortunately there's no signs of systemic infection. No fevers, chills, nausea, or vomiting noted at  this time. Unfortunately I'm just not seeing the progress that I would expect especially after cleaning the wounds off  sufficiently. Patient History SHATIKA, GRINNELL ([id number]) Information obtained from Patient. Family History Cancer - Mother, Diabetes - Siblings, Heart Disease - Father,Siblings, Hypertension - Siblings, Kidney Disease - Siblings, No family history of Hereditary Spherocytosis, Lung Disease, Seizures. Social History Never smoker, Marital Status - Married, Alcohol Use - Never, Drug Use - No History, Caffeine Use - Rarely. Medical History Eyes Denies history of Cataracts, Glaucoma, Optic Neuritis Ear/Nose/Mouth/Throat Denies history of Chronic sinus problems/congestion, Middle ear problems Hematologic/Lymphatic Denies history of Anemia, Hemophilia, Human Immunodeficiency Virus, Lymphedema, Sickle Cell Disease Respiratory Denies history of Aspiration, Asthma, Chronic Obstructive Pulmonary Disease (COPD), Pneumothorax, Sleep Apnea, Tuberculosis Cardiovascular Patient has history of Congestive Heart Failure, Coronary Artery Disease Denies history of Angina, Arrhythmia, Deep Vein Thrombosis, Hypertension, Hypotension, Myocardial Infarction, Peripheral Arterial Disease, Peripheral Venous Disease, Phlebitis, Vasculitis Gastrointestinal Denies history of Cirrhosis , Colitis, Crohn s, Hepatitis A, Hepatitis B, Hepatitis Fields Endocrine Denies history of Type I Diabetes, Type II Diabetes Genitourinary Patient has history of End Stage Renal Disease Immunological Denies history of Lupus Erythematosus, Raynaud s, Scleroderma Integumentary (Skin) Denies history of History of Burn, History of pressure wounds Musculoskeletal Denies history of Gout, Rheumatoid Arthritis, Osteoarthritis, Osteomyelitis Neurologic Denies history of Dementia, Neuropathy, Quadriplegia, Paraplegia Oncologic Denies history of Received Chemotherapy, Received Radiation Psychiatric Denies history of Anorexia/bulimia, Confinement Anxiety Review of Systems (ROS) Constitutional Symptoms (General Health) Denies complaints or  symptoms of Fever, Chills. Respiratory The patient has no complaints or symptoms. Cardiovascular The patient has no complaints or symptoms. Psychiatric The patient has no complaints or symptoms. Elizabeth Fields, [name] ([id number]) Objective Constitutional Thin and well-hydrated in no acute distress. Vitals Time Taken: 2:02 PM, Height: 61 in, Weight: 100 lbs, BMI: 18.9, Temperature: 97.8 F, Pulse: 95 bpm, Respiratory Rate: 16 breaths/min, Blood Pressure: 128/60 mmHg. Respiratory normal breathing without difficulty. clear to auscultation bilaterally. Cardiovascular regular rate and rhythm with normal S1, S2. Psychiatric this patient is able to make decisions and demonstrates good insight into disease process. Alert and Oriented x 3. pleasant and cooperative. General Notes: Due to the pain no sharp debridement was performed today as the patient did not want me to do anything at this point. I'm definitely okay with avoiding the debridement at this time her swelling seems to be doing fairly well she also would prefer not to have the wrap like we did last time again I she states the pressure pushing on it seemed to make it burn and hurt worse. She still has [place] noted on the surface of the wounds especially the right lower extremity. This also sniffing everything in maceration around the edge of the wound and I did look at the dressing there was purulent drainage noted. I'm gonna culture this wound on the fray that she may actually have an infection. Integumentary (Hair, Skin) Wound #1 status is Open. Original cause of wound was Trauma. The wound is located on the Right,Anterior Lower Leg. The wound measures 1.8cm length x 1.5cm width x 0.2cm depth; 2.121cm^2 area and 0.424cm^3 volume. There is Fat Layer (Subcutaneous Tissue) Exposed exposed. There is no tunneling or undermining noted. There is a small amount of serous drainage noted. The wound margin is flat and intact. There is no granulation  within the wound bed. There is a large (67-100%) amount of necrotic tissue within the wound bed including Adherent Slough. The periwound skin appearance exhibited: Hemosiderin Staining. The periwound  skin appearance did not exhibit: Callus, Crepitus, Excoriation, Induration, Rash, Scarring, Dry/Scaly, Maceration, Atrophie Blanche, Cyanosis, Ecchymosis, Mottled, Pallor, Rubor, Erythema. Periwound temperature was noted as No Abnormality. The periwound has tenderness on palpation. Wound #2 status is Open. Original cause of wound was Trauma. The wound is located on the Left,Anterior Lower Leg. The wound measures 1.1cm length x 1.2cm width x 0.2cm depth; 1.037cm^2 area and 0.207cm^3 volume. There is Fat Layer (Subcutaneous Tissue) Exposed exposed. There is no tunneling or undermining noted. There is a medium amount of serous drainage noted. The wound margin is flat and intact. There is no granulation within the wound bed. There is a medium (34- 66%) amount of necrotic tissue within the wound bed including Adherent Slough. The periwound skin appearance exhibited: Hemosiderin Staining. The periwound skin appearance did not exhibit: Callus, Crepitus, Excoriation, Induration, Rash, Scarring, Dry/Scaly, Maceration, Atrophie Blanche, Cyanosis, Ecchymosis, Mottled, Pallor, Rubor, Erythema. Periwound temperature was noted as No Abnormality. The periwound has tenderness on palpation. Assessment Active Problems ICD-10 Non-pressure chronic ulcer of other part of right lower leg with fat layer exposed Non-pressure chronic ulcer of other part of left lower leg with fat layer exposed Unspecified open wound, left lower leg, initial encounter Elizabeth Fields, [name] ([id number]) Unspecified open wound, right lower leg, initial encounter Paroxysmal atrial fibrillation Long term (current) use of anticoagulants Chronic kidney disease, stage 3 (moderate) Plan Wound Cleansing: Wound #1 Right,Anterior Lower Leg: May Shower,  gently pat wound dry prior to applying new dressing. - Clean with soap and water Wound #2 Left,Anterior Lower Leg: May Shower, gently pat wound dry prior to applying new dressing. - Clean with soap and water Primary Wound Dressing: Wound #1 Right,Anterior Lower Leg: Silver Alginate Wound #2 Left,Anterior Lower Leg: Silver Alginate Secondary Dressing: Wound #1 Right,Anterior Lower Leg: ABD and Kerlix/Conform - Fish net Wound #2 Left,Anterior Lower Leg: ABD and Kerlix/Conform - Fish net Dressing Change Frequency: Wound #1 Right,Anterior Lower Leg: Change Dressing Monday, Wednesday, Friday Wound #2 Left,Anterior Lower Leg: Change Dressing Monday, Wednesday, Friday Follow-up Appointments: Wound #1 Right,Anterior Lower Leg: Return Appointment in 1 week. Edema Control: Wound #1 Right,Anterior Lower Leg: Elevate legs to the level of the heart and pump ankles as often as possible Medications-please add to medication list.: Wound #1 Right,Anterior Lower Leg: P.O. Antibiotics - Take and finish all antibiotics prescribed Wound #2 Left,Anterior Lower Leg: P.O. Antibiotics - Take and finish all antibiotics prescribed Laboratory ordered were: Wound culture routine - Left lower leg The following medication(s) was prescribed: doxycycline hyclate oral 100 mg capsule 1 1 capsule oral taken 2 times a day for 14 days starting 02/21/2018 My suggestion currently is gonna be that we go ahead and initiate treatment with the above wound care measures for the next week. Patient is in agreement with the plan. Subsequently we're gonna see were things stand at follow-up. If anything changes or worsens the meantime she will contact the office and let me know. Hopefully the antibiotic will be beneficial in helping to calm down the inflammation and pain. Please see above for specific wound care orders. We will see patient for re-evaluation in 1 week(s) here in the clinic. If anything worsens or changes patient  will contact our office for additional recommendations. Elizabeth Fields, Elizabeth Fields ([id number]) Electronic Signature(s) Signed: 02/23/2018 7:15:48 AM By: Worthy Keeler PA-Fields Entered By: Worthy Keeler on 02/21/2018 15:05:09 Elizabeth Fields, [name] ([id number]) -------------------------------------------------------------------------------- ROS/PFSH Details Patient Name: Elizabeth Fields, [name] Date of Service: 02/21/2018 2:00 PM Medical Record Number: [id number]  Patient Account Number: [id number] Date of Birth/Sex: [birth year] (86 y.o. F) Treating RN: Harold Barban Primary Care Provider: Harrel Lemon Other Clinician: Referring Provider: Harrel Lemon Treating Provider/Extender: Melburn Hake, HOYT Weeks in Treatment: 5 Information Obtained From Patient Wound History Do you currently have one or more open woundso Yes How many open wounds do you currently haveo 2 Approximately how long have you had your woundso 8 weeks How have you been treating your wound(s) until nowo peroxide, neosporin and wound cleanser and bandage. Has your wound(s) ever healed and then re-openedo No Have you had any lab work done in the past montho No Have you tested positive for an antibiotic resistant organism No (MRSA, VRE)o Have you tested positive for osteomyelitis (bone infection)o No Have you had any tests for circulation on your legso No Constitutional Symptoms (General Health) Complaints and Symptoms: Negative for: Fever; Chills Eyes Medical History: Negative for: Cataracts; Glaucoma; Optic Neuritis Ear/Nose/Mouth/Throat Medical History: Negative for: Chronic sinus problems/congestion; Middle ear problems Hematologic/Lymphatic Medical History: Negative for: Anemia; Hemophilia; Human Immunodeficiency Virus; Lymphedema; Sickle Cell Disease Respiratory Complaints and Symptoms: No Complaints or Symptoms Medical History: Negative for: Aspiration; Asthma; Chronic Obstructive Pulmonary Disease (COPD); Pneumothorax; Sleep  Apnea; Tuberculosis Cardiovascular Complaints and Symptoms: No Complaints or Symptoms Elizabeth Fields, [name] ([id number]) Medical History: Positive for: Congestive Heart Failure; Coronary Artery Disease Negative for: Angina; Arrhythmia; Deep Vein Thrombosis; Hypertension; Hypotension; Myocardial Infarction; Peripheral Arterial Disease; Peripheral Venous Disease; Phlebitis; Vasculitis Gastrointestinal Medical History: Negative for: Cirrhosis ; Colitis; Crohnos; Hepatitis A; Hepatitis B; Hepatitis Fields Endocrine Medical History: Negative for: Type I Diabetes; Type II Diabetes Genitourinary Medical History: Positive for: End Stage Renal Disease Immunological Medical History: Negative for: Lupus Erythematosus; Raynaudos; Scleroderma Integumentary (Skin) Medical History: Negative for: History of Burn; History of pressure wounds Musculoskeletal Medical History: Negative for: Gout; Rheumatoid Arthritis; Osteoarthritis; Osteomyelitis Neurologic Medical History: Negative for: Dementia; Neuropathy; Quadriplegia; Paraplegia Oncologic Medical History: Negative for: Received Chemotherapy; Received Radiation Psychiatric Complaints and Symptoms: No Complaints or Symptoms Medical History: Negative for: Anorexia/bulimia; Confinement Anxiety Immunizations Pneumococcal Vaccine: Received Pneumococcal Vaccination: Yes Implantable Devices Elizabeth Fields, Darnita Fields. ([id number]) Family and Social History Cancer: Yes - Mother; Diabetes: Yes - Siblings; Heart Disease: Yes - Father,Siblings; Hereditary Spherocytosis: No; Hypertension: Yes - Siblings; Kidney Disease: Yes - Siblings; Lung Disease: No; Seizures: No; Never smoker; Marital Status - Married; Alcohol Use: Never; Drug Use: No History; Caffeine Use: Rarely; Financial Concerns: No; Food, Clothing or Shelter Needs: No; Support System Lacking: No; Transportation Concerns: No; Advanced Directives: Yes (Not Provided); Patient does not want information on Advanced  Directives; Do not resuscitate: Yes (Not Provided); Living Will: Yes (Not Provided); Medical Power of Attorney: Yes (Not Provided) Physician Affirmation I have reviewed and agree with the above information. Electronic Signature(s) Signed: 02/23/2018 7:15:48 AM By: Worthy Keeler PA-Fields Signed: 02/24/2018 9:36:27 AM By: Harold Barban Entered By: Worthy Keeler on 02/21/2018 14:59:37 Klauer, Kyliee Fields. ([id number]) -------------------------------------------------------------------------------- SuperBill Details Patient Name: Hegeman, Lucillie Fields. Date of Service: 02/21/2018 Medical Record Number: [id number] Patient Account Number: [id number] Date of Birth/Sex: 1931/11/05 (85 y.o. F) Treating RN: Harold Barban Primary Care Provider: Harrel Lemon Other Clinician: Referring Provider: Harrel Lemon Treating Provider/Extender: Melburn Hake, HOYT Weeks in Treatment: 5 Diagnosis Coding ICD-10 Codes Code Description [id number] Non-pressure chronic ulcer of other part of right lower leg with fat layer exposed L97.822 Non-pressure chronic ulcer of other part of left lower leg with fat layer exposed S81.802A Unspecified open wound, left lower leg, initial encounter S81.801A Unspecified open  wound, right lower leg, initial encounter I48.0 Paroxysmal atrial fibrillation Z79.01 Long term (current) use of anticoagulants N18.3 Chronic kidney disease, stage 3 (moderate) Physician Procedures CPT4 Code Description: [id number] - WC PHYS LEVEL 4 - EST PT ICD-10 Diagnosis Description C37.628 Non-pressure chronic ulcer of other part of right lower leg w L97.822 Non-pressure chronic ulcer of other part of left lower leg wi S81.802A Unspecified  open wound, left lower leg, initial encounter S81.801A Unspecified open wound, right lower leg, initial encounter Modifier: ith fat layer expo th fat layer expos Quantity: 1 sed ed Electronic Signature(s) Signed: 02/23/2018 7:15:48 AM By: Worthy Keeler PA-Fields Entered By: Worthy Keeler on 02/21/2018 15:01:40

## 2018-02-24 NOTE — Progress Notes (Signed)
RAVONDA, BRECHEEN ([id number]) Visit Report for 02/21/2018 Arrival Information Details Patient Name: Plack, Margaux C. Date of Service: 02/21/2018 2:00 PM Medical Record Number: [id number] Patient Account Number: [id number] Date of Birth/Sex: Apr 07, 1931 (85 y.o. F) Treating RN: Army Melia Primary Care Krizia Flight: Harrel Lemon Other Clinician: Referring Ashonte Angelucci: Harrel Lemon Treating Kiran Carline/Extender: Melburn Hake, HOYT Weeks in Treatment: 5 Visit Information History Since Last Visit Added or deleted any medications: No Patient Arrived: Ambulatory Any new allergies or adverse reactions: No Arrival Time: 14:03 Had a fall or experienced change in No Accompanied By: husband activities of daily living that may affect Transfer Assistance: None risk of falls: Patient Identification Verified: Yes Signs or symptoms of abuse/neglect since last visito No Hospitalized since last visit: No Implantable device outside of the clinic excluding No cellular tissue based products placed in the center since last visit: Pain Present Now: Yes Electronic Signature(s) Signed: 02/24/2018 8:46:05 AM By: Army Melia Entered By: Army Melia on 02/21/2018 14:03:47 Bickle, Ellaree C. ([id number]) -------------------------------------------------------------------------------- Clinic Level of Care Assessment Details Patient Name: Schneeberger, Adelaine C. Date of Service: 02/21/2018 2:00 PM Medical Record Number: [id number] Patient Account Number: [id number] Date of Birth/Sex: [birth year] (85 y.o. F) Treating RN: Harold Barban Primary Care Raeann Offner: Harrel Lemon Other Clinician: Referring Rakeya Glab: Harrel Lemon Treating Keydi Giel/Extender: Melburn Hake, HOYT Weeks in Treatment: 5 Clinic Level of Care Assessment Items TOOL 4 Quantity Score []  - Use when only an EandM is performed on FOLLOW-UP visit 0 ASSESSMENTS - Nursing Assessment / Reassessment X - Reassessment of Co-morbidities (includes updates in patient status) 1  10 X- 1 5 Reassessment of Adherence to Treatment Plan ASSESSMENTS - Wound and Skin Assessment / Reassessment []  - Simple Wound Assessment / Reassessment - one wound 0 X- 2 5 Complex Wound Assessment / Reassessment - multiple wounds []  - 0 Dermatologic / Skin Assessment (not related to wound area) ASSESSMENTS - Focused Assessment []  - Circumferential Edema Measurements - multi extremities 0 []  - 0 Nutritional Assessment / Counseling / Intervention []  - 0 Lower Extremity Assessment (monofilament, tuning fork, pulses) []  - 0 Peripheral Arterial Disease Assessment (using hand held doppler) ASSESSMENTS - Ostomy and/or Continence Assessment and Care []  - Incontinence Assessment and Management 0 []  - 0 Ostomy Care Assessment and Management (repouching, etc.) PROCESS - Coordination of Care X - Simple Patient / Family Education for ongoing care 1 15 []  - 0 Complex (extensive) Patient / Family Education for ongoing care X- 1 10 Staff obtains Programmer, systems, Records, Test Results / Process Orders []  - 0 Staff telephones HHA, Nursing Homes / Clarify orders / etc []  - 0 Routine Transfer to another Facility (non-emergent condition) []  - 0 Routine Hospital Admission (non-emergent condition) []  - 0 New Admissions / Biomedical engineer / Ordering NPWT, Apligraf, etc. []  - 0 Emergency Hospital Admission (emergent condition) X- 1 10 Simple Discharge Coordination Minami, Jaculin C. ([id number]) []  - 0 Complex (extensive) Discharge Coordination PROCESS - Special Needs []  - Pediatric / Minor Patient Management 0 []  - 0 Isolation Patient Management []  - 0 Hearing / Language / Visual special needs []  - 0 Assessment of Community assistance (transportation, D/C planning, etc.) []  - 0 Additional assistance / Altered mentation []  - 0 Support Surface(s) Assessment (bed, cushion, seat, etc.) INTERVENTIONS - Wound Cleansing / Measurement []  - Simple Wound Cleansing - one wound 0 X- 2 5 Complex  Wound Cleansing - multiple wounds X- 1 5 Wound Imaging (photographs - any number of wounds) []  - 0 Wound Tracing (instead of photographs) []  -  0 Simple Wound Measurement - one wound X- 2 5 Complex Wound Measurement - multiple wounds INTERVENTIONS - Wound Dressings []  - Small Wound Dressing one or multiple wounds 0 X- 2 15 Medium Wound Dressing one or multiple wounds []  - 0 Large Wound Dressing one or multiple wounds []  - 0 Application of Medications - topical []  - 0 Application of Medications - injection INTERVENTIONS - Miscellaneous []  - External ear exam 0 []  - 0 Specimen Collection (cultures, biopsies, blood, body fluids, etc.) []  - 0 Specimen(s) / Culture(s) sent or taken to Lab for analysis []  - 0 Patient Transfer (multiple staff / Civil Service fast streamer / Similar devices) []  - 0 Simple Staple / Suture removal (25 or less) []  - 0 Complex Staple / Suture removal (26 or more) []  - 0 Hypo / Hyperglycemic Management (close monitor of Blood Glucose) []  - 0 Ankle / Brachial Index (ABI) - do not check if billed separately X- 1 5 Vital Signs Reagor, Palyn C. ([id number]) Has the patient been seen at the hospital within the last three years: Yes Total Score: 120 Level Of Care: New/Established - Level 4 Electronic Signature(s) Signed: 02/24/2018 9:36:27 AM By: Harold Barban Entered By: Harold Barban on 02/21/2018 15:02:32 Marsan, Early C. ([id number]) -------------------------------------------------------------------------------- Encounter Discharge Information Details Patient Name: Temples, See C. Date of Service: 02/21/2018 2:00 PM Medical Record Number: [id number] Patient Account Number: [id number] Date of Birth/Sex: February 08, 1931 (85 y.o. F) Treating RN: Montey Hora Primary Care Jacqualynn Parco: Harrel Lemon Other Clinician: Referring Winfield Caba: Harrel Lemon Treating Smayan Hackbart/Extender: Melburn Hake, HOYT Weeks in Treatment: 5 Encounter Discharge Information Items Discharge Condition:  Stable Ambulatory Status: Ambulatory Discharge Destination: Home Transportation: Private Auto Accompanied By: self Schedule Follow-up Appointment: Yes Clinical Summary of Care: Electronic Signature(s) Signed: 02/21/2018 3:17:53 PM By: Montey Hora Entered By: Montey Hora on 02/21/2018 15:17:53 Fojtik, Brenna C. ([id number]) -------------------------------------------------------------------------------- Lower Extremity Assessment Details Patient Name: Aquilar, Marquesha C. Date of Service: 02/21/2018 2:00 PM Medical Record Number: [id number] Patient Account Number: [id number] Date of Birth/Sex: 1931-05-27 (85 y.o. F) Treating RN: Army Melia Primary Care Zacari Radick: Harrel Lemon Other Clinician: Referring Matthews Franks: Harrel Lemon Treating Jamear Carbonneau/Extender: Melburn Hake, HOYT Weeks in Treatment: 5 Edema Assessment Assessed: [Left: No] [Right: No] Edema: [Left: No] [Right: No] Vascular Assessment Pulses: Dorsalis Pedis Palpable: [Left:Yes] [Right:Yes] Posterior Tibial Extremity colors, hair growth, and conditions: Extremity Color: [Left:Hyperpigmented] [Right:Hyperpigmented] Hair Growth on Extremity: [Left:No] [Right:No] Temperature of Extremity: [Left:Warm] [Right:Warm] Capillary Refill: [Left:< 3 seconds] [Right:< 3 seconds] Toe Nail Assessment Left: Right: Thick: No No Discolored: No No Deformed: No No Improper Length and Hygiene: No No Electronic Signature(s) Signed: 02/24/2018 8:46:05 AM By: Army Melia Entered By: Army Melia on 02/21/2018 14:13:52 Grounds, Lindamarie C. ([id number]) -------------------------------------------------------------------------------- Multi Wound Chart Details Patient Name: Nyborg, Serrita C. Date of Service: 02/21/2018 2:00 PM Medical Record Number: [id number] Patient Account Number: [id number] Date of Birth/Sex: 06/14/1931 (85 y.o. F) Treating RN: Harold Barban Primary Care Vianca Bracher: Harrel Lemon Other Clinician: Referring Terrence Pizana: Harrel Lemon Treating Khylah Kendra/Extender: Melburn Hake, HOYT Weeks in Treatment: 5 Vital Signs Height(in): 61 Pulse(bpm): 95 Weight(lbs): 100 Blood Pressure(mmHg): 128/60 Body Mass Index(BMI): 19 Temperature(F): 97.8 Respiratory Rate 16 (breaths/min): Photos: [1:No Photos] [2:No Photos] [N/A:N/A] Wound Location: [1:Right Lower Leg - Anterior] [2:Left Lower Leg - Anterior] [N/A:N/A] Wounding Event: [1:Trauma] [2:Trauma] [N/A:N/A] Primary Etiology: [1:Trauma, Other] [2:Trauma, Other] [N/A:N/A] Comorbid History: [1:Congestive Heart Failure, Coronary Artery Disease, End Stage Renal Disease] [2:Congestive Heart Failure, Coronary Artery Disease, End Stage Renal Disease] [N/A:N/A] Date Acquired: [1:12/26/2017] [2:11/17/2017] [N/A:N/A] Weeks  of Treatment: [1:5] [2:5] [N/A:N/A] Wound Status: [1:Open] [2:Open] [N/A:N/A] Measurements L x W x D [1:1.8x1.5x0.2] [2:1.1x1.2x0.2] [N/A:N/A] (cm) Area (cm) : [1:2.121] [2:1.037] [N/A:N/A] Volume (cm) : [1:0.424] [2:0.207] [N/A:N/A] % Reduction in Area: [1:18.20%] [2:-65.10%] [N/A:N/A] % Reduction in Volume: [1:-63.70%] [2:-228.60%] [N/A:N/A] Classification: [1:Full Thickness Without Exposed Support Structures] [2:Full Thickness Without Exposed Support Structures] [N/A:N/A] Exudate Amount: [1:Small] [2:Medium] [N/A:N/A] Exudate Type: [1:Serous] [2:Serous] [N/A:N/A] Exudate Color: [1:amber] [2:amber] [N/A:N/A] Wound Margin: [1:Flat and Intact] [2:Flat and Intact] [N/A:N/A] Granulation Amount: [1:None Present (0%)] [2:None Present (0%)] [N/A:N/A] Necrotic Amount: [1:Large (67-100%)] [2:Medium (34-66%)] [N/A:N/A] Exposed Structures: [1:Fat Layer (Subcutaneous Tissue) Exposed: Yes Fascia: No Tendon: No Muscle: No Joint: No Bone: No] [2:Fat Layer (Subcutaneous Tissue) Exposed: Yes Fascia: No Tendon: No Muscle: No Joint: No Bone: No] [N/A:N/A] Epithelialization: [1:None] [2:None] [N/A:N/A] Periwound Skin Texture: [1:Excoriation: No Induration: No Callus: No  Crepitus: No] [2:Excoriation: No Induration: No Callus: No Crepitus: No] [N/A:N/A] Rash: No Rash: No Scarring: No Scarring: No Periwound Skin Moisture: Maceration: No Maceration: No N/A Dry/Scaly: No Dry/Scaly: No Periwound Skin Color: Hemosiderin Staining: Yes Hemosiderin Staining: Yes N/A Atrophie Blanche: No Atrophie Blanche: No Cyanosis: No Cyanosis: No Ecchymosis: No Ecchymosis: No Erythema: No Erythema: No Mottled: No Mottled: No Pallor: No Pallor: No Rubor: No Rubor: No Temperature: No Abnormality No Abnormality N/A Tenderness on Palpation: Yes Yes N/A Wound Preparation: Ulcer Cleansing: Ulcer Cleansing: N/A Rinsed/Irrigated with Saline Rinsed/Irrigated with Saline Topical Anesthetic Applied: Topical Anesthetic Applied: Other: lidocaine 4% Other: lidocaine 4% Treatment Notes Electronic Signature(s) Signed: 02/24/2018 9:36:27 AM By: Harold Barban Entered By: Harold Barban on 02/21/2018 14:48:45 Skelly, Sonny C. ([id number]) -------------------------------------------------------------------------------- [place] Details Patient Name: Wimbish, Haille C. Date of Service: 02/21/2018 2:00 PM Medical Record Number: [id number] Patient Account Number: [id number] Date of Birth/Sex: 12-Apr-1931 (85 y.o. F) Treating RN: Harold Barban Primary Care Kaenan Jake: Harrel Lemon Other Clinician: Referring Jaiyah Beining: Harrel Lemon Treating Jef Futch/Extender: Melburn Hake, HOYT Weeks in Treatment: 5 Active Inactive Abuse / Safety / Falls / Self Care Management Nursing Diagnoses: Potential for falls Goals: Patient will not experience any injury related to falls Date Initiated: 01/17/2018 Target Resolution Date: 04/12/2018 Goal Status: Active Interventions: Assess fall risk on admission and as needed Notes: Orientation to the Wound Care Program Nursing Diagnoses: Knowledge deficit related to the wound healing center program Goals: Patient/caregiver will  verbalize understanding of the [facility] Program Date Initiated: 01/17/2018 Target Resolution Date: 04/12/2018 Goal Status: Active Interventions: Provide education on orientation to the wound center Notes: Wound/Skin Impairment Nursing Diagnoses: Impaired tissue integrity Goals: Ulcer/skin breakdown will heal within 14 weeks Date Initiated: 01/17/2018 Target Resolution Date: 04/12/2018 Goal Status: Active Interventions: Assess patient/caregiver ability to obtain necessary supplies Stanhope, Kattie C. ([id number]) Assess patient/caregiver ability to perform ulcer/skin care regimen upon admission and as needed Assess ulceration(s) every visit Notes: Electronic Signature(s) Signed: 02/24/2018 9:36:27 AM By: Harold Barban Entered By: Harold Barban on 02/21/2018 14:48:38 Arvie, Leauna C. ([id number]) -------------------------------------------------------------------------------- Pain Assessment Details Patient Name: Meany, Ireta C. Date of Service: 02/21/2018 2:00 PM Medical Record Number: [id number] Patient Account Number: [id number] Date of Birth/Sex: [birth year] (85 y.o. F) Treating RN: Army Melia Primary Care Zhane Donlan: Harrel Lemon Other Clinician: Referring Royetta Probus: Harrel Lemon Treating Jazlin Tapscott/Extender: Melburn Hake, HOYT Weeks in Treatment: 5 Active Problems Location of Pain Severity and Description of Pain Patient Has Paino Yes Site Locations Pain Location: Pain in Ulcers Rate the pain. Current Pain Level: 4 Character of Pain Describe the Pain: Burning Pain Management and Medication Current Pain Management:  Electronic Signature(s) Signed: 02/24/2018 8:46:05 AM By: Army Melia Entered By: Army Melia on 02/21/2018 14:04:09 Nierenberg, Tanna Savoy ([id number]) -------------------------------------------------------------------------------- Patient/Caregiver Education Details Patient Name: Bethard, Alyce C. Date of Service: 02/21/2018 2:00 PM Medical Record Number:  [id number] Patient Account Number: [id number] Date of Birth/Gender: 1931-12-29 (85 y.o. F) Treating RN: Harold Barban Primary Care Physician: Harrel Lemon Other Clinician: Referring Physician: Harrel Lemon Treating Physician/Extender: Sharalyn Ink in Treatment: 5 Education Assessment Education Provided To: Patient Education Topics Provided Wound/Skin Impairment: Handouts: Caring for Your Ulcer Methods: Demonstration, Explain/Verbal Responses: State content correctly Electronic Signature(s) Signed: 02/24/2018 9:36:27 AM By: Harold Barban Entered By: Harold Barban on 02/21/2018 14:49:48 Shealy, Valkyrie C. ([id number]) -------------------------------------------------------------------------------- Wound Assessment Details Patient Name: Wilz, Abiha C. Date of Service: 02/21/2018 2:00 PM Medical Record Number: [id number] Patient Account Number: [id number] Date of Birth/Sex: 1931/08/10 (85 y.o. F) Treating RN: Army Melia Primary Care Lasonya Hubner: Harrel Lemon Other Clinician: Referring Antone Summons: Harrel Lemon Treating Delsa Walder/Extender: Melburn Hake, HOYT Weeks in Treatment: 5 Wound Status Wound Number: 1 Primary Trauma, Other Etiology: Wound Location: Right Lower Leg - Anterior Wound Open Wounding Event: Trauma Status: Date Acquired: 12/26/2017 Comorbid Congestive Heart Failure, Coronary Artery Weeks Of Treatment: 5 History: Disease, End Stage Renal Disease Clustered Wound: No Photos Wound Measurements Length: (cm) 1.8 Width: (cm) 1.5 Depth: (cm) 0.2 Area: (cm) 2.121 Volume: (cm) 0.424 % Reduction in Area: 18.2% % Reduction in Volume: -63.7% Epithelialization: None Tunneling: No Undermining: No Wound Description Full Thickness Without Exposed Support Classification: Structures Wound Margin: Flat and Intact Exudate Medium Amount: Exudate Type: Serous Exudate Color: amber Foul Odor After Cleansing: No Slough/Fibrino Yes Wound Bed Granulation  Amount: None Present (0%) Exposed Structure Necrotic Amount: Large (67-100%) Fascia Exposed: No Necrotic Quality: Adherent Slough Fat Layer (Subcutaneous Tissue) Exposed: Yes Tendon Exposed: No Muscle Exposed: No Joint Exposed: No Bone Exposed: No Periwound Skin Texture Patti, Niya C. ([id number]) Texture Color No Abnormalities Noted: No No Abnormalities Noted: No Callus: No Atrophie Blanche: No Crepitus: No Cyanosis: No Excoriation: No Ecchymosis: No Induration: No Erythema: No Rash: No Hemosiderin Staining: Yes Scarring: No Mottled: No Pallor: No Moisture Rubor: No No Abnormalities Noted: No Dry / Scaly: No Temperature / Pain Maceration: No Temperature: No Abnormality Tenderness on Palpation: Yes Wound Preparation Ulcer Cleansing: Rinsed/Irrigated with Saline Topical Anesthetic Applied: Other: lidocaine 4%, Treatment Notes Wound #1 (Right, Anterior Lower Leg) Notes silvercel, abd, conform and netting Electronic Signature(s) Signed: 02/21/2018 4:52:52 PM By: Gretta Cool, BSN, RN, CWS, Kim RN, BSN Signed: 02/24/2018 8:46:05 AM By: Army Melia Entered By: Gretta Cool, BSN, RN, CWS, Kim on 02/21/2018 16:52:51 Lamb, Gracelee C. ([id number]) -------------------------------------------------------------------------------- Wound Assessment Details Patient Name: Ragain, Valma C. Date of Service: 02/21/2018 2:00 PM Medical Record Number: [id number] Patient Account Number: [id number] Date of Birth/Sex: [birth year] (85 y.o. F) Treating RN: Army Melia Primary Care Honestee Revard: Harrel Lemon Other Clinician: Referring Durene Dodge: Harrel Lemon Treating Edwing Figley/Extender: Melburn Hake, HOYT Weeks in Treatment: 5 Wound Status Wound Number: 2 Primary Trauma, Other Etiology: Wound Location: Left Lower Leg - Anterior Wound Open Wounding Event: Trauma Status: Date Acquired: 11/17/2017 Comorbid Congestive Heart Failure, Coronary Artery Weeks Of Treatment: 5 History: Disease, End Stage Renal  Disease Clustered Wound: No Photos Photo Uploaded By: Army Melia on 02/21/2018 15:18:30 Wound Measurements Length: (cm) 1.1 Width: (cm) 1.2 Depth: (cm) 0.2 Area: (cm) 1.037 Volume: (cm) 0.207 % Reduction in Area: -65.1% % Reduction in Volume: -228.6% Epithelialization: None Tunneling: No Undermining: No Wound Description Full Thickness Without Exposed Support Classification: Structures Wound  Margin: Flat and Intact Exudate Medium Amount: Exudate Type: Serous Exudate Color: amber Foul Odor After Cleansing: No Slough/Fibrino Yes Wound Bed Granulation Amount: None Present (0%) Exposed Structure Necrotic Amount: Medium (34-66%) Fascia Exposed: No Necrotic Quality: Adherent Slough Fat Layer (Subcutaneous Tissue) Exposed: Yes Tendon Exposed: No Muscle Exposed: No Joint Exposed: No Bone Exposed: No Korte, Mayson C. ([id number]) Periwound Skin Texture Texture Color No Abnormalities Noted: No No Abnormalities Noted: No Callus: No Atrophie Blanche: No Crepitus: No Cyanosis: No Excoriation: No Ecchymosis: No Induration: No Erythema: No Rash: No Hemosiderin Staining: Yes Scarring: No Mottled: No Pallor: No Moisture Rubor: No No Abnormalities Noted: No Dry / Scaly: No Temperature / Pain Maceration: No Temperature: No Abnormality Tenderness on Palpation: Yes Wound Preparation Ulcer Cleansing: Rinsed/Irrigated with Saline Topical Anesthetic Applied: Other: lidocaine 4%, Treatment Notes Wound #2 (Left, Anterior Lower Leg) Notes silvercel, abd, conform and netting Electronic Signature(s) Signed: 02/24/2018 8:46:05 AM By: Army Melia Entered By: Army Melia on 02/21/2018 14:13:12 [place], [place] ([id number]) -------------------------------------------------------------------------------- Vitals Details Patient Name: Konicki, Ardelle C. Date of Service: 02/21/2018 2:00 PM Medical Record Number: [id number] Patient Account Number: [id number] Date of Birth/Sex:  Jul 16, 1931 (85 y.o. F) Treating RN: Army Melia Primary Care Darnelle Corp: Harrel Lemon Other Clinician: Referring Zyanne Schumm: Harrel Lemon Treating Melenda Bielak/Extender: Melburn Hake, HOYT Weeks in Treatment: 5 Vital Signs Time Taken: 14:02 Temperature (F): 97.8 Height (in): 61 Pulse (bpm): 95 Weight (lbs): 100 Respiratory Rate (breaths/min): 16 Body Mass Index (BMI): 18.9 Blood Pressure (mmHg): 128/60 Reference Range: 80 - 120 mg / dl Airway Electronic Signature(s) Signed: 02/24/2018 8:46:05 AM By: Army Melia Entered By: Army Melia on 02/21/2018 14:05:04

## 2018-02-28 ENCOUNTER — Encounter: Payer: PPO | Admitting: Physician Assistant

## 2018-02-28 DIAGNOSIS — B965 Pseudomonas (aeruginosa) (mallei) (pseudomallei) as the cause of diseases classified elsewhere: Secondary | ICD-10-CM | POA: Diagnosis not present

## 2018-02-28 DIAGNOSIS — L97822 Non-pressure chronic ulcer of other part of left lower leg with fat layer exposed: Secondary | ICD-10-CM | POA: Diagnosis not present

## 2018-02-28 DIAGNOSIS — L97812 Non-pressure chronic ulcer of other part of right lower leg with fat layer exposed: Secondary | ICD-10-CM | POA: Diagnosis not present

## 2018-02-28 LAB — AEROBIC/ANAEROBIC CULTURE W GRAM STAIN (SURGICAL/DEEP WOUND): Gram Stain: NONE SEEN

## 2018-03-03 DIAGNOSIS — I482 Chronic atrial fibrillation, unspecified: Secondary | ICD-10-CM | POA: Diagnosis not present

## 2018-03-03 DIAGNOSIS — I480 Paroxysmal atrial fibrillation: Secondary | ICD-10-CM | POA: Diagnosis not present

## 2018-03-03 DIAGNOSIS — I2510 Atherosclerotic heart disease of native coronary artery without angina pectoris: Secondary | ICD-10-CM | POA: Diagnosis not present

## 2018-03-03 DIAGNOSIS — L03116 Cellulitis of left lower limb: Secondary | ICD-10-CM | POA: Diagnosis not present

## 2018-03-03 DIAGNOSIS — E785 Hyperlipidemia, unspecified: Secondary | ICD-10-CM | POA: Diagnosis not present

## 2018-03-03 DIAGNOSIS — M810 Age-related osteoporosis without current pathological fracture: Secondary | ICD-10-CM | POA: Diagnosis not present

## 2018-03-03 DIAGNOSIS — Z0001 Encounter for general adult medical examination with abnormal findings: Secondary | ICD-10-CM | POA: Diagnosis not present

## 2018-03-03 DIAGNOSIS — N183 Chronic kidney disease, stage 3 (moderate): Secondary | ICD-10-CM | POA: Diagnosis not present

## 2018-03-03 DIAGNOSIS — E034 Atrophy of thyroid (acquired): Secondary | ICD-10-CM | POA: Diagnosis not present

## 2018-03-04 ENCOUNTER — Ambulatory Visit
Admission: RE | Admit: 2018-03-04 | Discharge: 2018-03-04 | Disposition: A | Discharge status: Home / Self Care | Payer: PPO | Source: Ambulatory Visit | Attending: Internal Medicine | Admitting: Internal Medicine

## 2018-03-04 DIAGNOSIS — Z1231 Encounter for screening mammogram for malignant neoplasm of breast: Secondary | ICD-10-CM | POA: Diagnosis not present

## 2018-03-04 NOTE — Progress Notes (Signed)
ARMIDA, VICKROY ([id number]) Visit Report for 02/28/2018 Arrival Information Details Patient Name: Elizabeth [name], [name] Date of Service: 02/28/2018 1:30 PM Medical Record Number: [id number] Patient Account Number: [id number] Date of Birth/Sex: 03-13-1931 (86 y.o. F) Treating RN: Montey Hora Primary Care Kitzia Camus: Harrel Lemon Other Clinician: Referring Elise Knobloch: Harrel Lemon Treating Lanayah Gartley/Extender: Melburn Hake, HOYT Weeks in Treatment: 6 Visit Information History Since Last Visit Added or deleted any medications: No Patient Arrived: Ambulatory Any new allergies or adverse reactions: No Arrival Time: 13:35 Had a fall or experienced change in No Accompanied By: self activities of daily living that may affect Transfer Assistance: None risk of falls: Patient Identification Verified: Yes Signs or symptoms of abuse/neglect since last visito No Secondary Verification Process Completed: Yes Hospitalized since last visit: No Implantable device outside of the clinic excluding No cellular tissue based products placed in the center since last visit: Has Dressing in Place as Prescribed: Yes Pain Present Now: No Electronic Signature(s) Signed: 02/28/2018 4:37:43 PM By: Montey Hora Entered By: Montey Hora on 02/28/2018 13:35:36 Ashby, Bintou C. ([id number]) -------------------------------------------------------------------------------- Clinic Level of Care Assessment Details Patient Name: [name], [name] Date of Service: 02/28/2018 1:30 PM Medical Record Number: [id number] Patient Account Number: [id number] Date of Birth/Sex: 11/30/1931 (86 y.o. F) Treating RN: Harold Barban Primary Care Carlito Bogert: Harrel Lemon Other Clinician: Referring Geofrey Silliman: Harrel Lemon Treating Khalel Alms/Extender: Melburn Hake, HOYT Weeks in Treatment: 6 Clinic Level of Care Assessment Items TOOL 4 Quantity Score []  - Use when only an EandM is performed on FOLLOW-UP visit 0 ASSESSMENTS - Nursing Assessment /  Reassessment X - Reassessment of Co-morbidities (includes updates in patient status) 1 10 X- 1 5 Reassessment of Adherence to Treatment Plan ASSESSMENTS - Wound and Skin Assessment / Reassessment []  - Simple Wound Assessment / Reassessment - one wound 0 X- 2 5 Complex Wound Assessment / Reassessment - multiple wounds []  - 0 Dermatologic / Skin Assessment (not related to wound area) ASSESSMENTS - Focused Assessment []  - Circumferential Edema Measurements - multi extremities 0 []  - 0 Nutritional Assessment / Counseling / Intervention []  - 0 Lower Extremity Assessment (monofilament, tuning fork, pulses) []  - 0 Peripheral Arterial Disease Assessment (using hand held doppler) ASSESSMENTS - Ostomy and/or Continence Assessment and Care []  - Incontinence Assessment and Management 0 []  - 0 Ostomy Care Assessment and Management (repouching, etc.) PROCESS - Coordination of Care X - Simple Patient / Family Education for ongoing care 1 15 []  - 0 Complex (extensive) Patient / Family Education for ongoing care X- 1 10 Staff obtains Programmer, systems, Records, Test Results / Process Orders []  - 0 Staff telephones HHA, Nursing Homes / Clarify orders / etc []  - 0 Routine Transfer to another Facility (non-emergent condition) []  - 0 Routine Hospital Admission (non-emergent condition) []  - 0 New Admissions / Biomedical engineer / Ordering NPWT, Apligraf, etc. []  - 0 Emergency Hospital Admission (emergent condition) X- 1 10 Simple Discharge Coordination Elizabeth [name], [name] ([id number]) []  - 0 Complex (extensive) Discharge Coordination PROCESS - Special Needs []  - Pediatric / Minor Patient Management 0 []  - 0 Isolation Patient Management []  - 0 Hearing / Language / Visual special needs []  - 0 Assessment of Community assistance (transportation, D/C planning, etc.) []  - 0 Additional assistance / Altered mentation []  - 0 Support Surface(s) Assessment (bed, cushion, seat, etc.) INTERVENTIONS -  Wound Cleansing / Measurement []  - Simple Wound Cleansing - one wound 0 X- 2 5 Complex Wound Cleansing - multiple wounds X- 1 5 Wound Imaging (photographs -  any number of wounds) []  - 0 Wound Tracing (instead of photographs) []  - 0 Simple Wound Measurement - one wound X- 2 5 Complex Wound Measurement - multiple wounds INTERVENTIONS - Wound Dressings X - Small Wound Dressing one or multiple wounds 2 10 []  - 0 Medium Wound Dressing one or multiple wounds []  - 0 Large Wound Dressing one or multiple wounds []  - 0 Application of Medications - topical []  - 0 Application of Medications - injection INTERVENTIONS - Miscellaneous []  - External ear exam 0 []  - 0 Specimen Collection (cultures, biopsies, blood, body fluids, etc.) []  - 0 Specimen(s) / Culture(s) sent or taken to Lab for analysis []  - 0 Patient Transfer (multiple staff / Civil Service fast streamer / Similar devices) []  - 0 Simple Staple / Suture removal (25 or less) []  - 0 Complex Staple / Suture removal (26 or more) []  - 0 Hypo / Hyperglycemic Management (close monitor of Blood Glucose) []  - 0 Ankle / Brachial Index (ABI) - do not check if billed separately X- 1 5 Vital Signs Elizabeth [name], [name] ([id number]) Has the patient been seen at the hospital within the last three years: Yes Total Score: 110 Level Of Care: New/Established - Level 3 Electronic Signature(s) Signed: 03/04/2018 11:13:25 AM By: Harold Barban Entered By: Harold Barban on 02/28/2018 14:01:58 Elizabeth [name], [name] ([id number]) -------------------------------------------------------------------------------- Encounter Discharge Information Details Patient Name: [name], [name] Date of Service: 02/28/2018 1:30 PM Medical Record Number: [id number] Patient Account Number: [id number] Date of Birth/Sex: Aug 09, 1931 (86 y.o. F) Treating RN: Army Melia Primary Care Jaycelynn Knickerbocker: Harrel Lemon Other Clinician: Referring Elgar Scoggins: Harrel Lemon Treating Tyanne Derocher/Extender: Melburn Hake,  HOYT Weeks in Treatment: 6 Encounter Discharge Information Items Discharge Condition: Stable Ambulatory Status: Ambulatory Discharge Destination: Home Transportation: Private Auto Accompanied By: self Schedule Follow-up Appointment: Yes Clinical Summary of Care: Electronic Signature(s) Signed: 02/28/2018 2:16:45 PM By: Army Melia Entered By: Army Melia on 02/28/2018 14:16:45 Elizabeth [name], [name] ([id number]) -------------------------------------------------------------------------------- Lower Extremity Assessment Details Patient Name: Elizabeth [name], [name] Date of Service: 02/28/2018 1:30 PM Medical Record Number: [id number] Patient Account Number: [id number] Date of Birth/Sex: Sep 20, 1931 (86 y.o. F) Treating RN: Montey Hora Primary Care Orabelle Rylee: Harrel Lemon Other Clinician: Referring Mckinnon Glick: Harrel Lemon Treating Desare Duddy/Extender: Melburn Hake, HOYT Weeks in Treatment: 6 Vascular Assessment Pulses: Dorsalis Pedis Palpable: [Left:Yes] [Right:Yes] Posterior Tibial Extremity colors, hair growth, and conditions: Extremity Color: [Left:Hyperpigmented] [Right:Hyperpigmented] Hair Growth on Extremity: [Left:No] [Right:No] Temperature of Extremity: [Left:Warm] [Right:Warm] Capillary Refill: [Left:< 3 seconds] [Right:< 3 seconds] Electronic Signature(s) Signed: 02/28/2018 4:37:43 PM By: Montey Hora Entered By: Montey Hora on 02/28/2018 13:42:25 Elizabeth [name], [name] ([id number]) -------------------------------------------------------------------------------- Multi Wound Chart Details Patient Name: Elizabeth [name], [name] Date of Service: 02/28/2018 1:30 PM Medical Record Number: [id number] Patient Account Number: [id number] Date of Birth/Sex: [birth year] (86 y.o. F) Treating RN: Harold Barban Primary Care Janayla Marik: Harrel Lemon Other Clinician: Referring Lynnel Zanetti: Harrel Lemon Treating Caitlynne Harbeck/Extender: Melburn Hake, HOYT Weeks in Treatment: 6 Vital Signs Height(in): 61 Pulse(bpm):  80 Weight(lbs): 100 Blood Pressure(mmHg): 125/56 Body Mass Index(BMI): 19 Temperature(F): 97.9 Respiratory Rate 16 (breaths/min): Photos: [1:No Photos] [2:No Photos] [N/A:N/A] Wound Location: [1:Right Lower Leg - Anterior] [2:Left Lower Leg - Anterior] [N/A:N/A] Wounding Event: [1:Trauma] [2:Trauma] [N/A:N/A] Primary Etiology: [1:Trauma, Other] [2:Trauma, Other] [N/A:N/A] Comorbid History: [1:Congestive Heart Failure, Coronary Artery Disease, End Stage Renal Disease] [2:Congestive Heart Failure, Coronary Artery Disease, End Stage Renal Disease] [N/A:N/A] Date Acquired: [1:12/26/2017] [2:11/17/2017] [N/A:N/A] Weeks of Treatment: [1:6] [2:6] [N/A:N/A] Wound Status: [1:Open] [2:Open] [N/A:N/A] Measurements L x W x D [1:1.6x1.4x0.2] [  2:0.9x0.8x0.1] [N/A:N/A] (cm) Area (cm) : [1:1.759] [2:0.565] [N/A:N/A] Volume (cm) : [1:0.352] [2:0.057] [N/A:N/A] % Reduction in Area: [1:32.10%] [2:10.00%] [N/A:N/A] % Reduction in Volume: [1:-35.90%] [2:9.50%] [N/A:N/A] Classification: [1:Full Thickness Without Exposed Support Structures] [2:Full Thickness Without Exposed Support Structures] [N/A:N/A] Exudate Amount: [1:Medium] [2:Medium] [N/A:N/A] Exudate Type: [1:Serous] [2:Serous] [N/A:N/A] Exudate Color: [1:amber] [2:amber] [N/A:N/A] Wound Margin: [1:Flat and Intact] [2:Flat and Intact] [N/A:N/A] Granulation Amount: [1:None Present (0%)] [2:None Present (0%)] [N/A:N/A] Necrotic Amount: [1:Large (67-100%)] [2:Medium (34-66%)] [N/A:N/A] Exposed Structures: [1:Fat Layer (Subcutaneous Tissue) Exposed: Yes Fascia: No Tendon: No Muscle: No Joint: No Bone: No] [2:Fat Layer (Subcutaneous Tissue) Exposed: Yes Fascia: No Tendon: No Muscle: No Joint: No Bone: No] [N/A:N/A] Epithelialization: [1:None] [2:None] [N/A:N/A] Periwound Skin Texture: [1:Excoriation: No Induration: No Callus: No Crepitus: No] [2:Excoriation: No Induration: No Callus: No Crepitus: No] [N/A:N/A] Rash: No Rash: No Scarring:  No Scarring: No Periwound Skin Moisture: Maceration: No Maceration: No N/A Dry/Scaly: No Dry/Scaly: No Periwound Skin Color: Hemosiderin Staining: Yes Hemosiderin Staining: Yes N/A Atrophie Blanche: No Atrophie Blanche: No Cyanosis: No Cyanosis: No Ecchymosis: No Ecchymosis: No Erythema: No Erythema: No Mottled: No Mottled: No Pallor: No Pallor: No Rubor: No Rubor: No Temperature: No Abnormality No Abnormality N/A Tenderness on Palpation: Yes Yes N/A Wound Preparation: Ulcer Cleansing: Ulcer Cleansing: N/A Rinsed/Irrigated with Saline Rinsed/Irrigated with Saline Topical Anesthetic Applied: Topical Anesthetic Applied: Other: lidocaine 4% Other: lidocaine 4% Treatment Notes Electronic Signature(s) Signed: 03/04/2018 11:13:25 AM By: Harold Barban Entered By: Harold Barban on 02/28/2018 14:00:19 Elizabeth [name], [name] ([id number]) -------------------------------------------------------------------------------- [place] Details Patient Name: Elizabeth [name], [name] Date of Service: 02/28/2018 1:30 PM Medical Record Number: [id number] Patient Account Number: [id number] Date of Birth/Sex: 08-30-1931 (86 y.o. F) Treating RN: Harold Barban Primary Care [place]: Harrel Lemon Other Clinician: Referring Flem Enderle: Harrel Lemon Treating Noam Karaffa/Extender: Melburn Hake, HOYT Weeks in Treatment: 6 Active Inactive Abuse / Safety / Falls / Self Care Management Nursing Diagnoses: Potential for falls Goals: Patient will not experience any injury related to falls Date Initiated: 01/17/2018 Target Resolution Date: 04/12/2018 Goal Status: Active Interventions: Assess fall risk on admission and as needed Notes: Orientation to the Wound Care Program Nursing Diagnoses: Knowledge deficit related to the wound healing center program Goals: Patient/caregiver will verbalize understanding of the [place] Program Date Initiated: 01/17/2018 Target Resolution Date:  04/12/2018 Goal Status: Active Interventions: Provide education on orientation to the wound center Notes: Wound/Skin Impairment Nursing Diagnoses: Impaired tissue integrity Goals: Ulcer/skin breakdown will heal within 14 weeks Date Initiated: 01/17/2018 Target Resolution Date: 04/12/2018 Goal Status: Active Interventions: Assess patient/caregiver ability to obtain necessary supplies An, Shanvi C. ([id number]) Assess patient/caregiver ability to perform ulcer/skin care regimen upon admission and as needed Assess ulceration(s) every visit Notes: Electronic Signature(s) Signed: 03/04/2018 11:13:25 AM By: Harold Barban Entered By: Harold Barban on 02/28/2018 14:00:12 Oxly, Tarnisha C. ([id number]) -------------------------------------------------------------------------------- Pain Assessment Details Patient Name: Elizabeth [name], [name] Date of Service: 02/28/2018 1:30 PM Medical Record Number: [id number] Patient Account Number: [id number] Date of Birth/Sex: 1931/09/06 (86 y.o. F) Treating RN: Montey Hora Primary Care Bobbye Reinitz: Harrel Lemon Other Clinician: Referring Raygen Dahm: Harrel Lemon Treating Valda Christenson/Extender: Melburn Hake, HOYT Weeks in Treatment: 6 Active Problems Location of Pain Severity and Description of Pain Patient Has Paino No Site Locations Pain Management and Medication Current Pain Management: Electronic Signature(s) Signed: 02/28/2018 4:37:43 PM By: Montey Hora Entered By: Montey Hora on 02/28/2018 13:35:41 Elizabeth [name], Samaria C. ([id number]) -------------------------------------------------------------------------------- Patient/Caregiver Education Details Patient Name: Barca, Chastity C. Date of Service: 02/28/2018 1:30 PM Medical  Record Number: [id number] Patient Account Number: [id number] Date of Birth/Gender: [birth year] (86 y.o. F) Treating RN: Harold Barban Primary Care Physician: Harrel Lemon Other Clinician: Referring Physician: Harrel Lemon Treating  Physician/Extender: Sharalyn Ink in Treatment: 6 Education Assessment Education Provided To: Patient Education Topics Provided Infection: Handouts: Infection Prevention and Management Methods: Demonstration, Explain/Verbal Responses: State content correctly Wound/Skin Impairment: Handouts: Caring for Your Ulcer Methods: Demonstration Responses: State content correctly Electronic Signature(s) Signed: 03/04/2018 11:13:25 AM By: Harold Barban Entered By: Harold Barban on 02/28/2018 14:00:49 Petre, Estelita C. ([id number]) -------------------------------------------------------------------------------- Wound Assessment Details Patient Name: Dreyfuss, Taffany C. Date of Service: 02/28/2018 1:30 PM Medical Record Number: [id number] Patient Account Number: [id number] Date of Birth/Sex: April 30, 1931 (86 y.o. F) Treating RN: Montey Hora Primary Care Sanad Fearnow: Harrel Lemon Other Clinician: Referring Blaike Vickers: Harrel Lemon Treating Jolyn Deshmukh/Extender: Melburn Hake, HOYT Weeks in Treatment: 6 Wound Status Wound Number: 1 Primary Trauma, Other Etiology: Wound Location: Right Lower Leg - Anterior Wound Open Wounding Event: Trauma Status: Date Acquired: 12/26/2017 Comorbid Congestive Heart Failure, Coronary Artery Weeks Of Treatment: 6 History: Disease, End Stage Renal Disease Clustered Wound: No Photos Photo Uploaded By: Montey Hora on 02/28/2018 16:34:06 Wound Measurements Length: (cm) 1.6 Width: (cm) 1.4 Depth: (cm) 0.2 Area: (cm) 1.759 Volume: (cm) 0.352 % Reduction in Area: 32.1% % Reduction in Volume: -35.9% Epithelialization: None Tunneling: No Undermining: No Wound Description Full Thickness Without Exposed Support Classification: Structures Wound Margin: Flat and Intact Exudate Medium Amount: Exudate Type: Serous Exudate Color: amber Foul Odor After Cleansing: No Slough/Fibrino Yes Wound Bed Granulation Amount: None Present (0%) Exposed  Structure Necrotic Amount: Large (67-100%) Fascia Exposed: No Necrotic Quality: Adherent Slough Fat Layer (Subcutaneous Tissue) Exposed: Yes Tendon Exposed: No Muscle Exposed: No Joint Exposed: No Bone Exposed: No Wernick, Nikole C. ([id number]) Periwound Skin Texture Texture Color No Abnormalities Noted: No No Abnormalities Noted: No Callus: No Atrophie Blanche: No Crepitus: No Cyanosis: No Excoriation: No Ecchymosis: No Induration: No Erythema: No Rash: No Hemosiderin Staining: Yes Scarring: No Mottled: No Pallor: No Moisture Rubor: No No Abnormalities Noted: No Dry / Scaly: No Temperature / Pain Maceration: No Temperature: No Abnormality Tenderness on Palpation: Yes Wound Preparation Ulcer Cleansing: Rinsed/Irrigated with Saline Topical Anesthetic Applied: Other: lidocaine 4%, Treatment Notes Wound #1 (Right, Anterior Lower Leg) Notes silvercel, abd, conform and netting Electronic Signature(s) Signed: 02/28/2018 4:37:43 PM By: Montey Hora Entered By: Montey Hora on 02/28/2018 13:41:53 Halteman, Brissia C. ([id number]) -------------------------------------------------------------------------------- Wound Assessment Details Patient Name: Facemire, Charley C. Date of Service: 02/28/2018 1:30 PM Medical Record Number: [id number] Patient Account Number: [id number] Date of Birth/Sex: 12/28/1931 (86 y.o. F) Treating RN: Montey Hora Primary Care Aiyanna Awtrey: Harrel Lemon Other Clinician: Referring Travaris Kosh: Harrel Lemon Treating Rayelynn Loyal/Extender: Melburn Hake, HOYT Weeks in Treatment: 6 Wound Status Wound Number: 2 Primary Trauma, Other Etiology: Wound Location: Left Lower Leg - Anterior Wound Open Wounding Event: Trauma Status: Date Acquired: 11/17/2017 Comorbid Congestive Heart Failure, Coronary Artery Weeks Of Treatment: 6 History: Disease, End Stage Renal Disease Clustered Wound: No Photos Photo Uploaded By: Montey Hora on 02/28/2018 16:34:07 Wound  Measurements Length: (cm) 0.9 Width: (cm) 0.8 Depth: (cm) 0.1 Area: (cm) 0.565 Volume: (cm) 0.057 % Reduction in Area: 10% % Reduction in Volume: 9.5% Epithelialization: None Tunneling: No Undermining: No Wound Description Full Thickness Without Exposed Support Classification: Structures Wound Margin: Flat and Intact Exudate Medium Amount: Exudate Type: Serous Exudate Color: amber Foul Odor After Cleansing: No Slough/Fibrino Yes Wound Bed Granulation Amount: None Present (0%) Exposed Structure Necrotic Amount:  Medium (34-66%) Fascia Exposed: No Necrotic Quality: Adherent Slough Fat Layer (Subcutaneous Tissue) Exposed: Yes Tendon Exposed: No Muscle Exposed: No Joint Exposed: No Bone Exposed: No Euceda, Atheena C. ([id number]) Periwound Skin Texture Texture Color No Abnormalities Noted: No No Abnormalities Noted: No Callus: No Atrophie Blanche: No Crepitus: No Cyanosis: No Excoriation: No Ecchymosis: No Induration: No Erythema: No Rash: No Hemosiderin Staining: Yes Scarring: No Mottled: No Pallor: No Moisture Rubor: No No Abnormalities Noted: No Dry / Scaly: No Temperature / Pain Maceration: No Temperature: No Abnormality Tenderness on Palpation: Yes Wound Preparation Ulcer Cleansing: Rinsed/Irrigated with Saline Topical Anesthetic Applied: Other: lidocaine 4%, Treatment Notes Wound #2 (Left, Anterior Lower Leg) Notes silvercel, abd, conform and netting Electronic Signature(s) Signed: 02/28/2018 4:37:43 PM By: Montey Hora Entered By: Montey Hora on 02/28/2018 13:42:04 Bray, Jalana C. ([id number]) -------------------------------------------------------------------------------- [place] Details Patient Name: Tudisco, Miu C. Date of Service: 02/28/2018 1:30 PM Medical Record Number: [id number] Patient Account Number: [id number] Date of Birth/Sex: 27-Dec-1931 (86 y.o. F) Treating RN: Montey Hora Primary Care Coretha Creswell: Harrel Lemon Other  Clinician: Referring Anniah Glick: Harrel Lemon Treating Willies Laviolette/Extender: Melburn Hake, HOYT Weeks in Treatment: 6 Vital Signs Time Taken: 13:35 Temperature (F): 97.9 Height (in): 61 Pulse (bpm): 73 Weight (lbs): 100 Respiratory Rate (breaths/min): 16 Body Mass Index (BMI): 18.9 Blood Pressure (mmHg): 125/56 Reference Range: 80 - 120 mg / dl Electronic Signature(s) Signed: 02/28/2018 4:37:43 PM By: Montey Hora Entered By: Montey Hora on 02/28/2018 13:36:16

## 2018-03-04 NOTE — Progress Notes (Signed)
Elizabeth [name], Elizabeth [name] ([id number]) Visit Report for 02/28/2018 Chief Complaint Document Details Patient Name: Elizabeth [name], [name] Date of Service: 02/28/2018 1:30 PM Medical Record Number: [id number] Patient Account Number: [id number] Date of Birth/Sex: [birth year] (85 y.o. F) Treating RN: Harold Barban Primary Care Provider: Harrel Lemon Other Clinician: Referring Provider: Harrel Lemon Treating Provider/Extender: Melburn Hake, HOYT Weeks in Treatment: 6 Information Obtained from: Patient Chief Complaint Bilateral LE Ulcers Electronic Signature(s) Signed: 02/28/2018 4:27:09 PM By: Worthy Keeler PA-C Entered By: Worthy Keeler on 02/28/2018 13:25:12 Elizabeth [name], [name] ([id number]) -------------------------------------------------------------------------------- HPI Details Patient Name: Elizabeth [name], [name] Date of Service: 02/28/2018 1:30 PM Medical Record Number: [id number] Patient Account Number: [id number] Date of Birth/Sex: 09/06/1931 (85 y.o. F) Treating RN: Harold Barban Primary Care Provider: Harrel Lemon Other Clinician: Referring Provider: Harrel Lemon Treating Provider/Extender: Melburn Hake, HOYT Weeks in Treatment: 6 History of Present Illness HPI Description: 01/17/18 on evaluation today patient presents for evaluation of two ulcers both on the anterior portion of her lower extremities one right and one left. The right she has had for about three weeks the left for about eight weeks and both occurred as a result of her traumatizing her shins on the bottom of the card were trying to get out of the car. She states this is never happen with any other car doors before but she has been having some issues with this sharp area on the store in particular. Both occurred in the similar situation where she was trying not to open the door the whole way in order to avoid damaging anybody else's car beside them. She is been using peroxide initially to clean the wound although she has converted to the  wound cleanser now. She has been using if anything just over the counter triple antibiotic ointment and a Band-Aid. With that being said she's not even been using that more recently. The most part she's just been attempting allow this to dry out/scab over. Her APIs were greater than 220 and noncompressible. Patient does have a history of paroxysmal atrial fibrillation for which she is on Coumadin. She also has chronic kidney disease stage III. She has been on antibiotics that I see no evidence of infection right now which is good news. 01/24/18 on evaluation today patient appears to be doing excellent in regard to her lower extremity ulcers. She has been tolerating the dressing changes without complication which is excellent news. Overall I have been very pleased with her progress. Nonetheless she still has have some ways to go before this will be completely healed. The left is a little bit better in appearance compared to the right which is somewhat deeper. 01/31/18 on evaluation today patient appears to be doing better in regard to her bilateral anterior lower extremity ulcers. She has been tolerating the dressing changes without complication which is excellent news. Fortunately there does not appear to be any evidence of infection at this time which is also good news. I'm very pleased with how things seem to be progressing at this point. The patient likewise is very happy and states she's not having the pain that she was having previous. 02/07/18 on evaluation today patient appears to be doing rather well in regard to her ulcers. The left especially seems to be making good progress in the right does have more slough and poor granulation tissue on the surface at this point. I think this is gonna require little bit more extensive debridement. Fortunately there's no signs of infection at this time. 02/14/18  on evaluation today patient appears to be doing better in the overall appearance of the wounds  although she tells me she's been having more burning over the past week. There appears to be some adhesive irritation. That has me somewhat concerned as far as the reason for the burning is concerned. With that being said I do not see any signs of actual infection which is good news. No fevers, chills, nausea, or vomiting noted at this time. Overall there does not appear to be any sign that there is worsening to me and the only reason the right extremity wound is bigger is that I actually performed a fairly significant debridement last week. That maybe some of the reason why she's had a little bit more pain as well. 02/21/18 on evaluation today patient appears to be doing poorly in regard to her lower extremity ulcers unfortunately. She continues to have discomfort despite the dressing switch I don't think the dressing is the issue I believe she may actually have an infection and that could be the problem that we're facing at this point. Fortunately there's no signs of systemic infection. No fevers, chills, nausea, or vomiting noted at this time. Unfortunately I'm just not seeing the progress that I would expect especially after cleaning the wounds off sufficiently. 02/28/18 on evaluation today patient appears to be doing a little better in regard to her bilateral anterior lower Trinity ulcer. She still has some inflammation surrounding the wound but not nearly as bad as last week. Her culture did come back showing positive for Pseudomonas as the organism likely responsible for the infection. For that reason I did stop the doxycycline sent in a prescription for Levaquin for her today. Other than that and pleased with how things seem to be progressing I do believe the alginate have been better for her. Electronic Signature(s) ITZAE, MCCURDY ([id number]) Signed: 02/28/2018 4:27:09 PM By: Worthy Keeler PA-C Entered By: Worthy Keeler on 02/28/2018 14:04:32 Elizabeth [name], Elizabeth [name]  ([id number]) -------------------------------------------------------------------------------- Physical Exam Details Patient Name: Elizabeth [name], [name] Date of Service: 02/28/2018 1:30 PM Medical Record Number: [id number] Patient Account Number: [id number] Date of Birth/Sex: 1931/10/03 (85 y.o. F) Treating RN: Harold Barban Primary Care Provider: Harrel Lemon Other Clinician: Referring Provider: Harrel Lemon Treating Provider/Extender: Melburn Hake, HOYT Weeks in Treatment: 6 Constitutional Well-nourished and well-hydrated in no acute distress. Respiratory normal breathing without difficulty. clear to auscultation bilaterally. Cardiovascular regular rate and rhythm with normal S1, S2. Psychiatric this patient is able to make decisions and demonstrates good insight into disease process. Alert and Oriented x 3. pleasant and cooperative. Notes Patient's wound bed currently shows signs of good granulation at this time which is excellent she does have some slough covering the surface of the wound though no sharp debridement was performed today since she seems to be doing somewhat poorly still in regard to the infection. All this he was the infection is cleared we may need to clear away some of the slough in order to allow the area to heal appropriately. Nonetheless right now I'm definitely not going to jump into anything that aggressive. Electronic Signature(s) Signed: 02/28/2018 4:27:09 PM By: Worthy Keeler PA-C Entered By: Worthy Keeler on 02/28/2018 14:05:24 Elizabeth [name], Elizabeth [name] ([id number]) -------------------------------------------------------------------------------- Physician Orders Details Patient Name: Elizabeth [name], [name] Date of Service: 02/28/2018 1:30 PM Medical Record Number: [id number] Patient Account Number: [id number] Date of Birth/Sex: [birth year] (85 y.o. F) Treating RN: Harold Barban Primary Care Provider: Harrel Lemon Other Clinician: Referring Provider: Edwina Barth  JOHN Treating  Provider/Extender: Melburn Hake, HOYT Weeks in Treatment: 6 Verbal / Phone Orders: No Diagnosis Coding ICD-10 Coding Code Description [phone] Non-pressure chronic ulcer of other part of right lower leg with fat layer exposed L97.822 Non-pressure chronic ulcer of other part of left lower leg with fat layer exposed S81.802A Unspecified open wound, left lower leg, initial encounter S81.801A Unspecified open wound, right lower leg, initial encounter I48.0 Paroxysmal atrial fibrillation Z79.01 Long term (current) use of anticoagulants N18.3 Chronic kidney disease, stage 3 (moderate) Wound Cleansing Wound #1 Right,Anterior Lower Leg o May Shower, gently pat wound dry prior to applying new dressing. - Clean with soap and water Wound #2 Left,Anterior Lower Leg o May Shower, gently pat wound dry prior to applying new dressing. - Clean with soap and water Primary Wound Dressing Wound #1 Right,Anterior Lower Leg o Silver Alginate Wound #2 Left,Anterior Lower Leg o Silver Alginate Secondary Dressing Wound #1 Right,Anterior Lower Leg o ABD and Kerlix/Conform - Fish net Wound #2 Left,Anterior Lower Leg o ABD and Kerlix/Conform - Fish net Dressing Change Frequency Wound #1 Right,Anterior Lower Leg o Change Dressing Monday, Wednesday, Friday Wound #2 Left,Anterior Lower Leg o Change Dressing Monday, Wednesday, Friday Follow-up Appointments Wound #1 Right,Anterior Lower Leg o Return Appointment in 1 week. AYEISHA, LINDENBERGER ([id number]) Edema Control Wound #1 Right,Anterior Lower Leg o Elevate legs to the level of the heart and pump ankles as often as possible Medications-please add to medication list. Wound #1 Right,Anterior Lower Leg o P.O. Antibiotics - Take and finish all antibiotics prescribed Wound #2 Left,Anterior Lower Leg o P.O. Antibiotics - Take and finish all antibiotics prescribed Patient Medications Allergies: No Known Allergies Notifications Medication  Indication Start End Levaquin 02/28/2018 DOSE 1 - oral 500 mg tablet - 1 tablet oral taken daily for 14 days Electronic Signature(s) Signed: 02/28/2018 4:27:09 PM By: Worthy Keeler PA-C Signed: 03/04/2018 11:13:25 AM By: Harold Barban Previous Signature: 02/28/2018 1:55:46 PM Version By: Worthy Keeler PA-C Entered By: Harold Barban on 02/28/2018 14:02:59 Elizabeth [name], [name] ([id number]) -------------------------------------------------------------------------------- Problem List Details Patient Name: Kargbo, Shakeita C. Date of Service: 02/28/2018 1:30 PM Medical Record Number: [id number] Patient Account Number: [id number] Date of Birth/Sex: Jul 06, 1931 (85 y.o. F) Treating RN: Harold Barban Primary Care Provider: Harrel Lemon Other Clinician: Referring Provider: Harrel Lemon Treating Provider/Extender: Melburn Hake, HOYT Weeks in Treatment: 6 Active Problems ICD-10 Evaluated Encounter Code Description Active Date Today Diagnosis L97.812 Non-pressure chronic ulcer of other part of right lower leg 01/17/2018 No Yes with fat layer exposed L97.822 Non-pressure chronic ulcer of other part of left lower leg with 01/17/2018 No Yes fat layer exposed S81.802A Unspecified open wound, left lower leg, initial encounter 01/17/2018 No Yes S81.801A Unspecified open wound, right lower leg, initial encounter 01/17/2018 No Yes I48.0 Paroxysmal atrial fibrillation 01/17/2018 No Yes Z79.01 Long term (current) use of anticoagulants 01/17/2018 No Yes N18.3 Chronic kidney disease, stage 3 (moderate) 01/17/2018 No Yes Inactive Problems Resolved Problems Electronic Signature(s) Signed: 02/28/2018 4:27:09 PM By: Worthy Keeler PA-C Entered By: Worthy Keeler on 02/28/2018 13:25:06 Elizabeth [name], [name] ([id number]) -------------------------------------------------------------------------------- Progress Note Details Patient Name: Oo, Elisavet C. Date of Service: 02/28/2018 1:30 PM Medical Record Number:  [id number] Patient Account Number: [id number] Date of Birth/Sex: Nov 08, 1931 (85 y.o. F) Treating RN: Harold Barban Primary Care Provider: Harrel Lemon Other Clinician: Referring Provider: Harrel Lemon Treating Provider/Extender: Melburn Hake, HOYT Weeks in Treatment: 6 Subjective Chief Complaint Information obtained from Patient Bilateral LE Ulcers History of Present Illness (HPI)  01/17/18 on evaluation today patient presents for evaluation of two ulcers both on the anterior portion of her lower extremities one right and one left. The right she has had for about three weeks the left for about eight weeks and both occurred as a result of her traumatizing her shins on the bottom of the card were trying to get out of the car. She states this is never happen with any other car doors before but she has been having some issues with this sharp area on the store in particular. Both occurred in the similar situation where she was trying not to open the door the whole way in order to avoid damaging anybody else's car beside them. She is been using peroxide initially to clean the wound although she has converted to the wound cleanser now. She has been using if anything just over the counter triple antibiotic ointment and a Band-Aid. With that being said she's not even been using that more recently. The most part she's just been attempting allow this to dry out/scab over. Her APIs were greater than 220 and noncompressible. Patient does have a history of paroxysmal atrial fibrillation for which she is on Coumadin. She also has chronic kidney disease stage III. She has been on antibiotics that I see no evidence of infection right now which is good news. 01/24/18 on evaluation today patient appears to be doing excellent in regard to her lower extremity ulcers. She has been tolerating the dressing changes without complication which is excellent news. Overall I have been very pleased with her progress.  Nonetheless she still has have some ways to go before this will be completely healed. The left is a little bit better in appearance compared to the right which is somewhat deeper. 01/31/18 on evaluation today patient appears to be doing better in regard to her bilateral anterior lower extremity ulcers. She has been tolerating the dressing changes without complication which is excellent news. Fortunately there does not appear to be any evidence of infection at this time which is also good news. I'm very pleased with how things seem to be progressing at this point. The patient likewise is very happy and states she's not having the pain that she was having previous. 02/07/18 on evaluation today patient appears to be doing rather well in regard to her ulcers. The left especially seems to be making good progress in the right does have more slough and poor granulation tissue on the surface at this point. I think this is gonna require little bit more extensive debridement. Fortunately there's no signs of infection at this time. 02/14/18 on evaluation today patient appears to be doing better in the overall appearance of the wounds although she tells me she's been having more burning over the past week. There appears to be some adhesive irritation. That has me somewhat concerned as far as the reason for the burning is concerned. With that being said I do not see any signs of actual infection which is good news. No fevers, chills, nausea, or vomiting noted at this time. Overall there does not appear to be any sign that there is worsening to me and the only reason the right extremity wound is bigger is that I actually performed a fairly significant debridement last week. That maybe some of the reason why she's had a little bit more pain as well. 02/21/18 on evaluation today patient appears to be doing poorly in regard to her lower extremity ulcers unfortunately. She continues to have discomfort  despite the dressing  switch I don't think the dressing is the issue I believe she may actually have an infection and that could be the problem that we're facing at this point. Fortunately there's no signs of systemic infection. No fevers, chills, nausea, or vomiting noted at this time. Unfortunately I'm just not seeing the progress that I would expect especially after cleaning the wounds off sufficiently. 02/28/18 on evaluation today patient appears to be doing a little better in regard to her bilateral anterior lower Trinity ulcer. She still has some inflammation surrounding the wound but not nearly as bad as last week. Her culture did come back showing Rudzinski, Mida C. ([id number]) positive for Pseudomonas as the organism likely responsible for the infection. For that reason I did stop the doxycycline sent in a prescription for Levaquin for her today. Other than that and pleased with how things seem to be progressing I do believe the alginate have been better for her. Patient History Information obtained from Patient. Family History Cancer - Mother, Diabetes - Siblings, Heart Disease - Father,Siblings, Hypertension - Siblings, Kidney Disease - Siblings, No family history of Hereditary Spherocytosis, Lung Disease, Seizures. Social History Never smoker, Marital Status - Married, Alcohol Use - Never, Drug Use - No History, Caffeine Use - Rarely. Medical History Eyes Denies history of Cataracts, Glaucoma, Optic Neuritis Ear/Nose/Mouth/Throat Denies history of Chronic sinus problems/congestion, Middle ear problems Hematologic/Lymphatic Denies history of Anemia, Hemophilia, Human Immunodeficiency Virus, Lymphedema, Sickle Cell Disease Respiratory Denies history of Aspiration, Asthma, Chronic Obstructive Pulmonary Disease (COPD), Pneumothorax, Sleep Apnea, Tuberculosis Cardiovascular Patient has history of Congestive Heart Failure, Coronary Artery Disease Denies history of Angina, Arrhythmia, Deep Vein Thrombosis,  Hypertension, Hypotension, Myocardial Infarction, Peripheral Arterial Disease, Peripheral Venous Disease, Phlebitis, Vasculitis Gastrointestinal Denies history of Cirrhosis , Colitis, Crohn s, Hepatitis A, Hepatitis B, Hepatitis C Endocrine Denies history of Type I Diabetes, Type II Diabetes Genitourinary Patient has history of End Stage Renal Disease Immunological Denies history of Lupus Erythematosus, Raynaud s, Scleroderma Integumentary (Skin) Denies history of History of Burn, History of pressure wounds Musculoskeletal Denies history of Gout, Rheumatoid Arthritis, Osteoarthritis, Osteomyelitis Neurologic Denies history of Dementia, Neuropathy, Quadriplegia, Paraplegia Oncologic Denies history of Received Chemotherapy, Received Radiation Psychiatric Denies history of Anorexia/bulimia, Confinement Anxiety Review of Systems (ROS) Constitutional Symptoms (General Health) Denies complaints or symptoms of Fatigue, Fever, Chills. Respiratory The patient has no complaints or symptoms. Cardiovascular The patient has no complaints or symptoms. Psychiatric The patient has no complaints or symptoms. Elizabeth [name], [name] ([id number]) Objective Constitutional Well-nourished and well-hydrated in no acute distress. Vitals Time Taken: 1:35 PM, Height: 61 in, Weight: 100 lbs, BMI: 18.9, Temperature: 97.9 F, Pulse: 73 bpm, Respiratory Rate: 16 breaths/min, Blood Pressure: 125/56 mmHg. Respiratory normal breathing without difficulty. clear to auscultation bilaterally. Cardiovascular regular rate and rhythm with normal S1, S2. Psychiatric this patient is able to make decisions and demonstrates good insight into disease process. Alert and Oriented x 3. pleasant and cooperative. General Notes: Patient's wound bed currently shows signs of good granulation at this time which is excellent she does have some slough covering the surface of the wound though no sharp debridement was performed today since  she seems to be doing somewhat poorly still in regard to the infection. All this he was the infection is cleared we may need to clear away some of the slough in order to allow the area to heal appropriately. Nonetheless right now I'm definitely not going to jump into anything that aggressive. Integumentary (Hair,  Skin) Wound #1 status is Open. Original cause of wound was Trauma. The wound is located on the Right,Anterior Lower Leg. The wound measures 1.6cm length x 1.4cm width x 0.2cm depth; 1.759cm^2 area and 0.352cm^3 volume. There is Fat Layer (Subcutaneous Tissue) Exposed exposed. There is no tunneling or undermining noted. There is a medium amount of serous drainage noted. The wound margin is flat and intact. There is no granulation within the wound bed. There is a large (67-100%) amount of necrotic tissue within the wound bed including Adherent Slough. The periwound skin appearance exhibited: Hemosiderin Staining. The periwound skin appearance did not exhibit: Callus, Crepitus, Excoriation, Induration, Rash, Scarring, Dry/Scaly, Maceration, Atrophie Blanche, Cyanosis, Ecchymosis, Mottled, Pallor, Rubor, Erythema. Periwound temperature was noted as No Abnormality. The periwound has tenderness on palpation. Wound #2 status is Open. Original cause of wound was Trauma. The wound is located on the Left,Anterior Lower Leg. The wound measures 0.9cm length x 0.8cm width x 0.1cm depth; 0.565cm^2 area and 0.057cm^3 volume. There is Fat Layer (Subcutaneous Tissue) Exposed exposed. There is no tunneling or undermining noted. There is a medium amount of serous drainage noted. The wound margin is flat and intact. There is no granulation within the wound bed. There is a medium (34- 66%) amount of necrotic tissue within the wound bed including Adherent Slough. The periwound skin appearance exhibited: Hemosiderin Staining. The periwound skin appearance did not exhibit: Callus, Crepitus, Excoriation,  Induration, Rash, Scarring, Dry/Scaly, Maceration, Atrophie Blanche, Cyanosis, Ecchymosis, Mottled, Pallor, Rubor, Erythema. Periwound temperature was noted as No Abnormality. The periwound has tenderness on palpation. Assessment Active Problems Elizabeth [name], [name] ([id number]) ICD-10 Non-pressure chronic ulcer of other part of right lower leg with fat layer exposed Non-pressure chronic ulcer of other part of left lower leg with fat layer exposed Unspecified open wound, left lower leg, initial encounter Unspecified open wound, right lower leg, initial encounter Paroxysmal atrial fibrillation Long term (current) use of anticoagulants Chronic kidney disease, stage 3 (moderate) Plan Wound Cleansing: Wound #1 Right,Anterior Lower Leg: May Shower, gently pat wound dry prior to applying new dressing. - Clean with soap and water Wound #2 Left,Anterior Lower Leg: May Shower, gently pat wound dry prior to applying new dressing. - Clean with soap and water Primary Wound Dressing: Wound #1 Right,Anterior Lower Leg: Silver Alginate Wound #2 Left,Anterior Lower Leg: Silver Alginate Secondary Dressing: Wound #1 Right,Anterior Lower Leg: ABD and Kerlix/Conform - Fish net Wound #2 Left,Anterior Lower Leg: ABD and Kerlix/Conform - Fish net Dressing Change Frequency: Wound #1 Right,Anterior Lower Leg: Change Dressing Monday, Wednesday, Friday Wound #2 Left,Anterior Lower Leg: Change Dressing Monday, Wednesday, Friday Follow-up Appointments: Wound #1 Right,Anterior Lower Leg: Return Appointment in 1 week. Edema Control: Wound #1 Right,Anterior Lower Leg: Elevate legs to the level of the heart and pump ankles as often as possible Medications-please add to medication list.: Wound #1 Right,Anterior Lower Leg: P.O. Antibiotics - Take and finish all antibiotics prescribed Wound #2 Left,Anterior Lower Leg: P.O. Antibiotics - Take and finish all antibiotics prescribed The following medication(s) was  prescribed: Levaquin oral 500 mg tablet 1 1 tablet oral taken daily for 14 days starting 02/28/2018 I'm a recommend that we continue with the above wound care measures for the next week. Patient is in agreement the plan. We will subsequently see were things stand at follow-up. If anything changes worsens the meantime patient will contact the office and let me know. Otherwise we will see were things stand at follow-up in one week. DELOROS, BERETTA. ([id number])  Please see above for specific wound care orders. We will see patient for re-evaluation in 1 week(s) here in the clinic. If anything worsens or changes patient will contact our office for additional recommendations. Electronic Signature(s) Signed: 02/28/2018 4:27:09 PM By: Worthy Keeler PA-C Entered By: Worthy Keeler on 02/28/2018 14:05:56 Pew, Lyndsi C. ([id number]) -------------------------------------------------------------------------------- ROS/PFSH Details Patient Name: Custer, Paisely C. Date of Service: 02/28/2018 1:30 PM Medical Record Number: [id number] Patient Account Number: [id number] Date of Birth/Sex: 04/24/1931 (85 y.o. F) Treating RN: Harold Barban Primary Care Provider: Harrel Lemon Other Clinician: Referring Provider: Harrel Lemon Treating Provider/Extender: Melburn Hake, HOYT Weeks in Treatment: 6 Information Obtained From Patient Wound History Do you currently have one or more open woundso Yes How many open wounds do you currently haveo 2 Approximately how long have you had your woundso 8 weeks How have you been treating your wound(s) until nowo peroxide, neosporin and wound cleanser and bandage. Has your wound(s) ever healed and then re-openedo No Have you had any lab work done in the past montho No Have you tested positive for an antibiotic resistant organism No (MRSA, VRE)o Have you tested positive for osteomyelitis (bone infection)o No Have you had any tests for circulation on your legso No Constitutional  Symptoms (General Health) Complaints and Symptoms: Negative for: Fatigue; Fever; Chills Eyes Medical History: Negative for: Cataracts; Glaucoma; Optic Neuritis Ear/Nose/Mouth/Throat Medical History: Negative for: Chronic sinus problems/congestion; Middle ear problems Hematologic/Lymphatic Medical History: Negative for: Anemia; Hemophilia; Human Immunodeficiency Virus; Lymphedema; Sickle Cell Disease Respiratory Complaints and Symptoms: No Complaints or Symptoms Medical History: Negative for: Aspiration; Asthma; Chronic Obstructive Pulmonary Disease (COPD); Pneumothorax; Sleep Apnea; Tuberculosis Cardiovascular Complaints and Symptoms: No Complaints or Symptoms Crace, Brette C. ([id number]) Medical History: Positive for: Congestive Heart Failure; Coronary Artery Disease Negative for: Angina; Arrhythmia; Deep Vein Thrombosis; Hypertension; Hypotension; Myocardial Infarction; Peripheral Arterial Disease; Peripheral Venous Disease; Phlebitis; Vasculitis Gastrointestinal Medical History: Negative for: Cirrhosis ; Colitis; Crohnos; Hepatitis A; Hepatitis B; Hepatitis C Endocrine Medical History: Negative for: Type I Diabetes; Type II Diabetes Genitourinary Medical History: Positive for: End Stage Renal Disease Immunological Medical History: Negative for: Lupus Erythematosus; Raynaudos; Scleroderma Integumentary (Skin) Medical History: Negative for: History of Burn; History of pressure wounds Musculoskeletal Medical History: Negative for: Gout; Rheumatoid Arthritis; Osteoarthritis; Osteomyelitis Neurologic Medical History: Negative for: Dementia; Neuropathy; Quadriplegia; Paraplegia Oncologic Medical History: Negative for: Received Chemotherapy; Received Radiation Psychiatric Complaints and Symptoms: No Complaints or Symptoms Medical History: Negative for: Anorexia/bulimia; Confinement Anxiety Immunizations Pneumococcal Vaccine: Received Pneumococcal Vaccination:  Yes Implantable Devices Dehne, Willo C. ([id number]) Family and Social History Cancer: Yes - Mother; Diabetes: Yes - Siblings; Heart Disease: Yes - Father,Siblings; Hereditary Spherocytosis: No; Hypertension: Yes - Siblings; Kidney Disease: Yes - Siblings; Lung Disease: No; Seizures: No; Never smoker; Marital Status - Married; Alcohol Use: Never; Drug Use: No History; Caffeine Use: Rarely; Financial Concerns: No; Food, Clothing or Shelter Needs: No; Support System Lacking: No; Transportation Concerns: No; Advanced Directives: Yes (Not Provided); Patient does not want information on Advanced Directives; Do not resuscitate: Yes (Not Provided); Living Will: Yes (Not Provided); Medical Power of Attorney: Yes (Not Provided) Physician Affirmation I have reviewed and agree with the above information. Electronic Signature(s) Signed: 02/28/2018 4:27:09 PM By: Worthy Keeler PA-C Signed: 03/04/2018 11:13:25 AM By: Harold Barban Entered By: Worthy Keeler on 02/28/2018 14:05:09 Iracheta, Elly C. ([id number]) -------------------------------------------------------------------------------- SuperBill Details Patient Name: Campton, Dennisha C. Date of Service: 02/28/2018 Medical Record Number: [id number] Patient Account Number: [id number] Date of Birth/Sex:  [birth year] (85 y.o. F) Treating RN: Harold Barban Primary Care Provider: Harrel Lemon Other Clinician: Referring Provider: Harrel Lemon Treating Provider/Extender: Melburn Hake, HOYT Weeks in Treatment: 6 Diagnosis Coding ICD-10 Codes Code Description [phone] Non-pressure chronic ulcer of other part of right lower leg with fat layer exposed L97.822 Non-pressure chronic ulcer of other part of left lower leg with fat layer exposed S81.802A Unspecified open wound, left lower leg, initial encounter S81.801A Unspecified open wound, right lower leg, initial encounter I48.0 Paroxysmal atrial fibrillation Z79.01 Long term (current) use of anticoagulants N18.3  Chronic kidney disease, stage 3 (moderate) Facility Procedures CPT4 Code: 62194712 Description: 99213 - WOUND CARE VISIT-LEV 3 EST PT Modifier: Quantity: 1 Physician Procedures CPT4 Code Description: 5271292 99214 - WC PHYS LEVEL 4 - EST PT ICD-10 Diagnosis Description T09.030 Non-pressure chronic ulcer of other part of right lower leg w B49.969 Non-pressure chronic ulcer of other part of left lower leg wi S81.802A Unspecified  open wound, left lower leg, initial encounter S81.801A Unspecified open wound, right lower leg, initial encounter Modifier: ith fat layer expo th fat layer expos Quantity: 1 sed ed Electronic Signature(s) Signed: 02/28/2018 4:27:09 PM By: Worthy Keeler PA-C Entered By: Worthy Keeler on 02/28/2018 14:06:32

## 2018-03-06 ENCOUNTER — Ambulatory Visit: Payer: PPO | Admitting: Physician Assistant

## 2018-03-06 DIAGNOSIS — E538 Deficiency of other specified B group vitamins: Secondary | ICD-10-CM | POA: Diagnosis not present

## 2018-03-07 ENCOUNTER — Encounter: Payer: PPO | Admitting: Physician Assistant

## 2018-03-07 DIAGNOSIS — L97812 Non-pressure chronic ulcer of other part of right lower leg with fat layer exposed: Secondary | ICD-10-CM | POA: Diagnosis not present

## 2018-03-07 DIAGNOSIS — L97822 Non-pressure chronic ulcer of other part of left lower leg with fat layer exposed: Secondary | ICD-10-CM | POA: Diagnosis not present

## 2018-03-09 NOTE — Progress Notes (Signed)
SAKIA, SCHRIMPF ([id number]) Visit Report for 03/07/2018 Chief Complaint Document Details Patient Name: Elizabeth Fields, [name] Date of Service: 03/07/2018 3:30 PM Medical Record Number: [id number] Patient Account Number: [id number] Date of Birth/Sex: 01/27/1931 (85 y.o. F) Treating RN: Harold Barban Primary Care Provider: Harrel Lemon Other Clinician: Referring Provider: Harrel Lemon Treating Provider/Extender: Melburn Hake, Lus Kriegel Weeks in Treatment: 7 Information Obtained from: Patient Chief Complaint Bilateral LE Ulcers Electronic Signature(s) Signed: 03/07/2018 5:19:05 PM By: Worthy Keeler PA-Fields Entered By: Worthy Keeler on 03/07/2018 16:13:42 Elizabeth Fields, [name] ([id number]) -------------------------------------------------------------------------------- HPI Details Patient Name: [name] Date of Service: 03/07/2018 3:30 PM Medical Record Number: [id number] Patient Account Number: [id number] Date of Birth/Sex: January 20, 1931 (85 y.o. F) Treating RN: Harold Barban Primary Care Provider: Harrel Lemon Other Clinician: Referring Provider: Harrel Lemon Treating Provider/Extender: Melburn Hake, Jermario Kalmar Weeks in Treatment: 7 History of Present Illness HPI Description: 01/17/18 on evaluation today patient presents for evaluation of two ulcers both on the anterior portion of her lower extremities one right and one left. The right she has had for about three weeks the left for about eight weeks and both occurred as a result of her traumatizing her shins on the bottom of the card were trying to get out of the car. She states this is never happen with any other car doors before but she has been having some issues with this sharp area on the store in particular. Both occurred in the similar situation where she was trying not to open the door the whole way in order to avoid damaging anybody else's car beside them. She is been using peroxide initially to clean the wound although she has converted to the  wound cleanser now. She has been using if anything just over the counter triple antibiotic ointment and a Band-Aid. With that being said she's not even been using that more recently. The most part she's just been attempting allow this to dry out/scab over. Her APIs were greater than 220 and noncompressible. Patient does have a history of paroxysmal atrial fibrillation for which she is on Coumadin. She also has chronic kidney disease stage III. She has been on antibiotics that I see no evidence of infection right now which is good news. 01/24/18 on evaluation today patient appears to be doing excellent in regard to her lower extremity ulcers. She has been tolerating the dressing changes without complication which is excellent news. Overall I have been very pleased with her progress. Nonetheless she still has have some ways to go before this will be completely healed. The left is a little bit better in appearance compared to the right which is somewhat deeper. 01/31/18 on evaluation today patient appears to be doing better in regard to her bilateral anterior lower extremity ulcers. She has been tolerating the dressing changes without complication which is excellent news. Fortunately there does not appear to be any evidence of infection at this time which is also good news. I'm very pleased with how things seem to be progressing at this point. The patient likewise is very happy and states she's not having the pain that she was having previous. 02/07/18 on evaluation today patient appears to be doing rather well in regard to her ulcers. The left especially seems to be making good progress in the right does have more slough and poor granulation tissue on the surface at this point. I think this is gonna require little bit more extensive debridement. Fortunately there's no signs of infection at this time. 02/14/18  on evaluation today patient appears to be doing better in the overall appearance of the wounds  although she tells me she's been having more burning over the past week. There appears to be some adhesive irritation. That has me somewhat concerned as far as the reason for the burning is concerned. With that being said I do not see any signs of actual infection which is good news. No fevers, chills, nausea, or vomiting noted at this time. Overall there does not appear to be any sign that there is worsening to me and the only reason the right extremity wound is bigger is that I actually performed a fairly significant debridement last week. That maybe some of the reason why she's had a little bit more pain as well. 02/21/18 on evaluation today patient appears to be doing poorly in regard to her lower extremity ulcers unfortunately. She continues to have discomfort despite the dressing switch I don't think the dressing is the issue I believe she may actually have an infection and that could be the problem that we're facing at this point. Fortunately there's no signs of systemic infection. No fevers, chills, nausea, or vomiting noted at this time. Unfortunately I'm just not seeing the progress that I would expect especially after cleaning the wounds off sufficiently. 02/28/18 on evaluation today patient appears to be doing a little better in regard to her bilateral anterior lower Trinity ulcer. She still has some inflammation surrounding the wound but not nearly as bad as last week. Her culture did come back showing positive for Pseudomonas as the organism likely responsible for the infection. For that reason I did stop the doxycycline sent in a prescription for Levaquin for her today. Other than that and pleased with how things seem to be progressing I do believe the alginate have been better for her. 03/07/18 on evaluation today patient actually appears to be doing very well in regard to her bilateral lower Trinity ulcer's. In fact she seems to be showing signs of excellent improvement I do believe  that the Levaquin we prescribed has been of benefit for [name] ([id number]) her the infection appears to be clearing quite nicely. I'm very pleased in that regard. Her pain is also much less severe compared to what it was previous. Electronic Signature(s) Signed: 03/07/2018 5:19:05 PM By: Worthy Keeler PA-Fields Entered By: Worthy Keeler on 03/07/2018 16:13:57 Elizabeth Fields, [name] ([id number]) -------------------------------------------------------------------------------- Physical Exam Details Patient Name: Treto, Staphanie Fields. Date of Service: 03/07/2018 3:30 PM Medical Record Number: [id number] Patient Account Number: [id number] Date of Birth/Sex: 1932/01/08 (85 y.o. F) Treating RN: Harold Barban Primary Care Provider: Harrel Lemon Other Clinician: Referring Provider: Harrel Lemon Treating Provider/Extender: Melburn Hake, Decie Verne Weeks in Treatment: 7 Constitutional Well-nourished and well-hydrated in no acute distress. Respiratory normal breathing without difficulty. clear to auscultation bilaterally. Cardiovascular regular rate and rhythm with normal S1, S2. Psychiatric this patient is able to make decisions and demonstrates good insight into disease process. Alert and Oriented x 3. pleasant and cooperative. Notes Patient's wound bed currently did show some Slough noted on servicing wound in both locations I believe this is something that we need to try to loosen and clean up. I think cental would be an excellent option for her in this regard. The patient is in agreement with giving this a try she would much prefer that over sharp debridement. She states that just hurts so bad she's not sure that she can do that again. Electronic Signature(s) Signed: 03/07/2018 5:19:05  PM By: Worthy Keeler PA-Fields Entered By: Worthy Keeler on 03/07/2018 16:14:27 Elizabeth Fields, Elizabeth Fields ([id number]) -------------------------------------------------------------------------------- Physician Orders Details Patient  Name: Elizabeth Fields, [name] Date of Service: 03/07/2018 3:30 PM Medical Record Number: [id number] Patient Account Number: [id number] Date of Birth/Sex: [birth year] (85 y.o. F) Treating RN: Harold Barban Primary Care Provider: Harrel Lemon Other Clinician: Referring Provider: Harrel Lemon Treating Provider/Extender: Melburn Hake, Sheron Tallman Weeks in Treatment: 7 Verbal / Phone Orders: No Diagnosis Coding Wound Cleansing Wound #1 Right,Anterior Lower Leg o May Shower, gently pat wound dry prior to applying new dressing. - Clean with soap and water Wound #2 Left,Anterior Lower Leg o May Shower, gently pat wound dry prior to applying new dressing. - Clean with soap and water Primary Wound Dressing Wound #1 Right,Anterior Lower Leg o Santyl Ointment - If Santyl is too expensive, Use MediHoney (over the counter) Wound #2 Left,Anterior Lower Leg o Santyl Ointment - If Santyl is too expensive, Use MediHoney (over the counter) Secondary Dressing Wound #1 Right,Anterior Lower Leg o ABD and Kerlix/Conform - Fish net Wound #2 Left,Anterior Lower Leg o ABD and Kerlix/Conform - Fish net Dressing Change Frequency Wound #1 Right,Anterior Lower Leg o Change dressing every day. Wound #2 Left,Anterior Lower Leg o Change dressing every day. Follow-up Appointments Wound #1 Right,Anterior Lower Leg o Return Appointment in 1 week. Wound #2 Left,Anterior Lower Leg o Return Appointment in 1 week. Edema Control Wound #1 Right,Anterior Lower Leg o Elevate legs to the level of the heart and pump ankles as often as possible Medications-please add to medication list. Wound #1 Right,Anterior Lower Leg o P.O. Antibiotics - Take and finish all antibiotics prescribed Elizabeth Fields, Mili Fields. ([id number]) Wound #2 Left,Anterior Lower Leg o P.O. Antibiotics - Take and finish all antibiotics prescribed Patient Medications Allergies: No Known Allergies Notifications Medication Indication Start End Santyl  03/07/2018 DOSE 0 - topical 250 unit/gram ointment - ointment topical applied nickel thick to the wound bed and then cover with a dressing as directed Electronic Signature(s) Signed: 03/07/2018 4:15:18 PM By: Worthy Keeler PA-Fields Entered By: Worthy Keeler on 03/07/2018 16:15:18 Elizabeth Fields, [name] ([id number]) -------------------------------------------------------------------------------- Problem List Details Patient Name: Elizabeth Fields, [name] Date of Service: 03/07/2018 3:30 PM Medical Record Number: [id number] Patient Account Number: [id number] Date of Birth/Sex: 12-Oct-1931 (85 y.o. F) Treating RN: Harold Barban Primary Care Provider: Harrel Lemon Other Clinician: Referring Provider: Harrel Lemon Treating Provider/Extender: Melburn Hake, Atilla Zollner Weeks in Treatment: 7 Active Problems ICD-10 Evaluated Encounter Code Description Active Date Today Diagnosis L97.812 Non-pressure chronic ulcer of other part of right lower leg 01/17/2018 No Yes with fat layer exposed L97.822 Non-pressure chronic ulcer of other part of left lower leg with 01/17/2018 No Yes fat layer exposed S81.802A Unspecified open wound, left lower leg, initial encounter 01/17/2018 No Yes S81.801A Unspecified open wound, right lower leg, initial encounter 01/17/2018 No Yes I48.0 Paroxysmal atrial fibrillation 01/17/2018 No Yes Z79.01 Long term (current) use of anticoagulants 01/17/2018 No Yes N18.3 Chronic kidney disease, stage 3 (moderate) 01/17/2018 No Yes Inactive Problems Resolved Problems Electronic Signature(s) Signed: 03/07/2018 5:19:05 PM By: Worthy Keeler PA-Fields Entered By: Worthy Keeler on 03/07/2018 16:13:37 Elizabeth Fields, [name] ([id number]) -------------------------------------------------------------------------------- Progress Note Details Patient Name: Elizabeth Fields, [name] Date of Service: 03/07/2018 3:30 PM Medical Record Number: [id number] Patient Account Number: [id number] Date of Birth/Sex: 06/23/1931 (85 y.o. F) Treating RN:  Harold Barban Primary Care Provider: Harrel Lemon Other Clinician: Referring Provider: Harrel Lemon Treating Provider/Extender: Melburn Hake, Desire Fulp Weeks in Treatment: 7  Subjective Chief Complaint Information obtained from Patient Bilateral LE Ulcers History of Present Illness (HPI) 01/17/18 on evaluation today patient presents for evaluation of two ulcers both on the anterior portion of her lower extremities one right and one left. The right she has had for about three weeks the left for about eight weeks and both occurred as a result of her traumatizing her shins on the bottom of the card were trying to get out of the car. She states this is never happen with any other car doors before but she has been having some issues with this sharp area on the store in particular. Both occurred in the similar situation where she was trying not to open the door the whole way in order to avoid damaging anybody else's car beside them. She is been using peroxide initially to clean the wound although she has converted to the wound cleanser now. She has been using if anything just over the counter triple antibiotic ointment and a Band-Aid. With that being said she's not even been using that more recently. The most part she's just been attempting allow this to dry out/scab over. Her APIs were greater than 220 and noncompressible. Patient does have a history of paroxysmal atrial fibrillation for which she is on Coumadin. She also has chronic kidney disease stage III. She has been on antibiotics that I see no evidence of infection right now which is good news. 01/24/18 on evaluation today patient appears to be doing excellent in regard to her lower extremity ulcers. She has been tolerating the dressing changes without complication which is excellent news. Overall I have been very pleased with her progress. Nonetheless she still has have some ways to go before this will be completely healed. The left is a little  bit better in appearance compared to the right which is somewhat deeper. 01/31/18 on evaluation today patient appears to be doing better in regard to her bilateral anterior lower extremity ulcers. She has been tolerating the dressing changes without complication which is excellent news. Fortunately there does not appear to be any evidence of infection at this time which is also good news. I'm very pleased with how things seem to be progressing at this point. The patient likewise is very happy and states she's not having the pain that she was having previous. 02/07/18 on evaluation today patient appears to be doing rather well in regard to her ulcers. The left especially seems to be making good progress in the right does have more slough and poor granulation tissue on the surface at this point. I think this is gonna require little bit more extensive debridement. Fortunately there's no signs of infection at this time. 02/14/18 on evaluation today patient appears to be doing better in the overall appearance of the wounds although she tells me she's been having more burning over the past week. There appears to be some adhesive irritation. That has me somewhat concerned as far as the reason for the burning is concerned. With that being said I do not see any signs of actual infection which is good news. No fevers, chills, nausea, or vomiting noted at this time. Overall there does not appear to be any sign that there is worsening to me and the only reason the right extremity wound is bigger is that I actually performed a fairly significant debridement last week. That maybe some of the reason why she's had a little bit more pain as well. 02/21/18 on evaluation today patient appears to be  doing poorly in regard to her lower extremity ulcers unfortunately. She continues to have discomfort despite the dressing switch I don't think the dressing is the issue I believe she may actually have an infection and that could  be the problem that we're facing at this point. Fortunately there's no signs of systemic infection. No fevers, chills, nausea, or vomiting noted at this time. Unfortunately I'm just not seeing the progress that I would expect especially after cleaning the wounds off sufficiently. 02/28/18 on evaluation today patient appears to be doing a little better in regard to her bilateral anterior lower Trinity ulcer. She still has some inflammation surrounding the wound but not nearly as bad as last week. Her culture did come back showing Elizabeth Fields, [name] ([id number]) positive for Pseudomonas as the organism likely responsible for the infection. For that reason I did stop the doxycycline sent in a prescription for Levaquin for her today. Other than that and pleased with how things seem to be progressing I do believe the alginate have been better for her. 03/07/18 on evaluation today patient actually appears to be doing very well in regard to her bilateral lower Trinity ulcer's. In fact she seems to be showing signs of excellent improvement I do believe that the Levaquin we prescribed has been of benefit for her the infection appears to be clearing quite nicely. I'm very pleased in that regard. Her pain is also much less severe compared to what it was previous. Patient History Information obtained from Patient. Family History Cancer - Mother, Diabetes - Siblings, Heart Disease - Father,Siblings, Hypertension - Siblings, Kidney Disease - Siblings, No family history of Hereditary Spherocytosis, Lung Disease, Seizures. Social History Never smoker, Marital Status - Married, Alcohol Use - Never, Drug Use - No History, Caffeine Use - Rarely. Medical History Eyes Denies history of Cataracts, Glaucoma, Optic Neuritis Ear/Nose/Mouth/Throat Denies history of Chronic sinus problems/congestion, Middle ear problems Hematologic/Lymphatic Denies history of Anemia, Hemophilia, Human Immunodeficiency Virus, Lymphedema,  Sickle Cell Disease Respiratory Denies history of Aspiration, Asthma, Chronic Obstructive Pulmonary Disease (COPD), Pneumothorax, Sleep Apnea, Tuberculosis Cardiovascular Patient has history of Congestive Heart Failure, Coronary Artery Disease Denies history of Angina, Arrhythmia, Deep Vein Thrombosis, Hypertension, Hypotension, Myocardial Infarction, Peripheral Arterial Disease, Peripheral Venous Disease, Phlebitis, Vasculitis Gastrointestinal Denies history of Cirrhosis , Colitis, Crohn s, Hepatitis A, Hepatitis B, Hepatitis Fields Endocrine Denies history of Type I Diabetes, Type II Diabetes Genitourinary Patient has history of End Stage Renal Disease Immunological Denies history of Lupus Erythematosus, Raynaud s, Scleroderma Integumentary (Skin) Denies history of History of Burn, History of pressure wounds Musculoskeletal Denies history of Gout, Rheumatoid Arthritis, Osteoarthritis, Osteomyelitis Neurologic Denies history of Dementia, Neuropathy, Quadriplegia, Paraplegia Oncologic Denies history of Received Chemotherapy, Received Radiation Psychiatric Denies history of Anorexia/bulimia, Confinement Anxiety Review of Systems (ROS) Constitutional Symptoms (General Health) Denies complaints or symptoms of Fever, Chills. Respiratory The patient has no complaints or symptoms. Cardiovascular The patient has no complaints or symptoms. NIKKIE, LIMING ([id number]) Psychiatric The patient has no complaints or symptoms. Objective Constitutional Well-nourished and well-hydrated in no acute distress. Vitals Time Taken: 3:45 PM, Height: 61 in, Weight: 100 lbs, BMI: 18.9, Temperature: 97.8 F, Pulse: 81 bpm, Respiratory Rate: 16 breaths/min, Blood Pressure: 129/69 mmHg. Respiratory normal breathing without difficulty. clear to auscultation bilaterally. Cardiovascular regular rate and rhythm with normal S1, S2. Psychiatric this patient is able to make decisions and demonstrates good  insight into disease process. Alert and Oriented x 3. pleasant and cooperative. General Notes: Patient's wound bed  currently did show some Slough noted on servicing wound in both locations I believe this is something that we need to try to loosen and clean up. I think cental would be an excellent option for her in this regard. The patient is in agreement with giving this a try she would much prefer that over sharp debridement. She states that just hurts so bad she's not sure that she can do that again. Integumentary (Hair, Skin) Wound #1 status is Open. Original cause of wound was Trauma. The wound is located on the Right,Anterior Lower Leg. The wound measures 0.5cm length x 0.2cm width x 0.1cm depth; 0.079cm^2 area and 0.008cm^3 volume. There is Fat Layer (Subcutaneous Tissue) Exposed exposed. There is no tunneling or undermining noted. There is a medium amount of serous drainage noted. The wound margin is flat and intact. There is no granulation within the wound bed. There is a large (67-100%) amount of necrotic tissue within the wound bed including Adherent Slough. The periwound skin appearance exhibited: Hemosiderin Staining. The periwound skin appearance did not exhibit: Callus, Crepitus, Excoriation, Induration, Rash, Scarring, Dry/Scaly, Maceration, Atrophie Blanche, Cyanosis, Ecchymosis, Mottled, Pallor, Rubor, Erythema. Periwound temperature was noted as No Abnormality. The periwound has tenderness on palpation. Wound #2 status is Open. Original cause of wound was Trauma. The wound is located on the Left,Anterior Lower Leg. The wound measures 0.1cm length x 0.1cm width x 0.1cm depth; 0.008cm^2 area and 0.001cm^3 volume. There is Fat Layer (Subcutaneous Tissue) Exposed exposed. There is no tunneling or undermining noted. There is a medium amount of serous drainage noted. The wound margin is flat and intact. There is no granulation within the wound bed. There is a medium (34- 66%) amount of  necrotic tissue within the wound bed including Adherent Slough. The periwound skin appearance exhibited: Hemosiderin Staining. The periwound skin appearance did not exhibit: Callus, Crepitus, Excoriation, Induration, Rash, Scarring, Dry/Scaly, Maceration, Atrophie Blanche, Cyanosis, Ecchymosis, Mottled, Pallor, Rubor, Erythema. Periwound temperature was noted as No Abnormality. The periwound has tenderness on palpation. XIA, STOHR ([id number]) Assessment Active Problems ICD-10 Non-pressure chronic ulcer of other part of right lower leg with fat layer exposed Non-pressure chronic ulcer of other part of left lower leg with fat layer exposed Unspecified open wound, left lower leg, initial encounter Unspecified open wound, right lower leg, initial encounter Paroxysmal atrial fibrillation Long term (current) use of anticoagulants Chronic kidney disease, stage 3 (moderate) Plan Wound Cleansing: Wound #1 Right,Anterior Lower Leg: May Shower, gently pat wound dry prior to applying new dressing. - Clean with soap and water Wound #2 Left,Anterior Lower Leg: May Shower, gently pat wound dry prior to applying new dressing. - Clean with soap and water Primary Wound Dressing: Wound #1 Right,Anterior Lower Leg: Santyl Ointment - If Santyl is too expensive, Use MediHoney (over the counter) Wound #2 Left,Anterior Lower Leg: Santyl Ointment - If Santyl is too expensive, Use MediHoney (over the counter) Secondary Dressing: Wound #1 Right,Anterior Lower Leg: ABD and Kerlix/Conform - Fish net Wound #2 Left,Anterior Lower Leg: ABD and Kerlix/Conform - Fish net Dressing Change Frequency: Wound #1 Right,Anterior Lower Leg: Change dressing every day. Wound #2 Left,Anterior Lower Leg: Change dressing every day. Follow-up Appointments: Wound #1 Right,Anterior Lower Leg: Return Appointment in 1 week. Wound #2 Left,Anterior Lower Leg: Return Appointment in 1 week. Edema Control: Wound #1  Right,Anterior Lower Leg: Elevate legs to the level of the heart and pump ankles as often as possible Medications-please add to medication list.: Wound #1 Right,Anterior Lower Leg:  P.O. Antibiotics - Take and finish all antibiotics prescribed Wound #2 Left,Anterior Lower Leg: P.O. Antibiotics - Take and finish all antibiotics prescribed The following medication(s) was prescribed: Santyl topical 250 unit/gram ointment 0 ointment topical applied nickel thick to the wound bed and then cover with a dressing as directed starting 03/07/2018 Elizabeth Fields, [name] ([id number]) I'm in a recommend that we continue with the above wound care measures for the next week. Patient is in agreement with plan. If anything changes worsens meantime she will contact the office and let us know who otherwise show her out apply the dressings with the Santyl today. I'll send the Santyl to the pharmacy for her. Please see above for specific wound care orders. We will see patient for re-evaluation in 1 week(s) here in the clinic. If anything worsens or changes patient will contact our office for additional recommendations. Electronic Signature(s) Signed: 03/07/2018 5:19:05 PM By: Worthy Keeler PA-Fields Entered By: Worthy Keeler on 03/07/2018 16:15:32 Elizabeth Fields, [name] ([id number]) -------------------------------------------------------------------------------- ROS/PFSH Details Patient Name: Buskirk, Valaria Fields. Date of Service: 03/07/2018 3:30 PM Medical Record Number: [id number] Patient Account Number: [id number] Date of Birth/Sex: March 20, 1931 (85 y.o. F) Treating RN: Harold Barban Primary Care Provider: Harrel Lemon Other Clinician: Referring Provider: Harrel Lemon Treating Provider/Extender: Melburn Hake, Zenia Guest Weeks in Treatment: 7 Information Obtained From Patient Wound History Do you currently have one or more open woundso Yes How many open wounds do you currently haveo 2 Approximately how long have you had your woundso 8  weeks How have you been treating your wound(s) until nowo peroxide, neosporin and wound cleanser and bandage. Has your wound(s) ever healed and then re-openedo No Have you had any lab work done in the past montho No Have you tested positive for an antibiotic resistant organism No (MRSA, VRE)o Have you tested positive for osteomyelitis (bone infection)o No Have you had any tests for circulation on your legso No Constitutional Symptoms (General Health) Complaints and Symptoms: Negative for: Fever; Chills Eyes Medical History: Negative for: Cataracts; Glaucoma; Optic Neuritis Ear/Nose/Mouth/Throat Medical History: Negative for: Chronic sinus problems/congestion; Middle ear problems Hematologic/Lymphatic Medical History: Negative for: Anemia; Hemophilia; Human Immunodeficiency Virus; Lymphedema; Sickle Cell Disease Respiratory Complaints and Symptoms: No Complaints or Symptoms Medical History: Negative for: Aspiration; Asthma; Chronic Obstructive Pulmonary Disease (COPD); Pneumothorax; Sleep Apnea; Tuberculosis Cardiovascular Complaints and Symptoms: No Complaints or Symptoms Cong, Ania Fields. ([id number]) Medical History: Positive for: Congestive Heart Failure; Coronary Artery Disease Negative for: Angina; Arrhythmia; Deep Vein Thrombosis; Hypertension; Hypotension; Myocardial Infarction; Peripheral Arterial Disease; Peripheral Venous Disease; Phlebitis; Vasculitis Gastrointestinal Medical History: Negative for: Cirrhosis ; Colitis; Crohnos; Hepatitis A; Hepatitis B; Hepatitis Fields Endocrine Medical History: Negative for: Type I Diabetes; Type II Diabetes Genitourinary Medical History: Positive for: End Stage Renal Disease Immunological Medical History: Negative for: Lupus Erythematosus; Raynaudos; Scleroderma Integumentary (Skin) Medical History: Negative for: History of Burn; History of pressure wounds Musculoskeletal Medical History: Negative for: Gout; Rheumatoid  Arthritis; Osteoarthritis; Osteomyelitis Neurologic Medical History: Negative for: Dementia; Neuropathy; Quadriplegia; Paraplegia Oncologic Medical History: Negative for: Received Chemotherapy; Received Radiation Psychiatric Complaints and Symptoms: No Complaints or Symptoms Medical History: Negative for: Anorexia/bulimia; Confinement Anxiety Immunizations Pneumococcal Vaccine: Received Pneumococcal Vaccination: Yes Implantable Devices No devices added Seidel, Kayah Fields. ([id number]) Family and Social History Cancer: Yes - Mother; Diabetes: Yes - Siblings; Heart Disease: Yes - Father,Siblings; Hereditary Spherocytosis: No; Hypertension: Yes - Siblings; Kidney Disease: Yes - Siblings; Lung Disease: No; Seizures: No; Never smoker; Marital Status - Married; Alcohol Use: Never; Drug  Use: No History; Caffeine Use: Rarely; Financial Concerns: No; Food, Clothing or Shelter Needs: No; Support System Lacking: No; Transportation Concerns: No; Advanced Directives: Yes (Not Provided); Patient does not want information on Advanced Directives; Do not resuscitate: Yes (Not Provided); Living Will: Yes (Not Provided); Medical Power of Attorney: Yes (Not Provided) Physician Affirmation I have reviewed and agree with the above information. Electronic Signature(s) Signed: 03/07/2018 5:19:05 PM By: Worthy Keeler PA-Fields Signed: 03/07/2018 5:19:20 PM By: Harold Barban Entered By: Worthy Keeler on 03/07/2018 16:14:11 Gropp, Daniele Fields. ([id number]) -------------------------------------------------------------------------------- SuperBill Details Patient Name: Plaskett, Laveyah Fields. Date of Service: 03/07/2018 Medical Record Number: [id number] Patient Account Number: [id number] Date of Birth/Sex: Dec 17, 1931 (85 y.o. F) Treating RN: Harold Barban Primary Care Provider: Harrel Lemon Other Clinician: Referring Provider: Harrel Lemon Treating Provider/Extender: Melburn Hake, Yanelli Zapanta Weeks in Treatment: 7 Diagnosis  Coding ICD-10 Codes Code Description [id number] Non-pressure chronic ulcer of other part of right lower leg with fat layer exposed L97.822 Non-pressure chronic ulcer of other part of left lower leg with fat layer exposed S81.802A Unspecified open wound, left lower leg, initial encounter S81.801A Unspecified open wound, right lower leg, initial encounter I48.0 Paroxysmal atrial fibrillation Z79.01 Long term (current) use of anticoagulants N18.3 Chronic kidney disease, stage 3 (moderate) Facility Procedures CPT4 Code: [id number] Description: 99213 - WOUND CARE VISIT-LEV 3 EST PT Modifier: Quantity: 1 Physician Procedures CPT4 Code Description: [id number] 99214 - WC PHYS LEVEL 4 - EST PT ICD-10 Diagnosis Description P53.005 Non-pressure chronic ulcer of other part of right lower leg w R10.211 Non-pressure chronic ulcer of other part of left lower leg wi S81.802A Unspecified  open wound, left lower leg, initial encounter S81.801A Unspecified open wound, right lower leg, initial encounter Modifier: ith fat layer expo th fat layer expos Quantity: 1 sed ed Electronic Signature(s) Signed: 03/07/2018 5:19:05 PM By: Worthy Keeler PA-Fields Entered By: Worthy Keeler on 03/07/2018 16:15:53

## 2018-03-11 NOTE — Progress Notes (Signed)
Elizabeth Fields ([id number]) Visit Report for 03/07/2018 Arrival Information Details Patient Name: Elizabeth Fields, [name] Date of Service: 03/07/2018 3:30 PM Medical Record Number: [id number] Patient Account Number: [id number] Date of Birth/Sex: Jan 26, 1931 (85 y.o. F) Treating RN: Elizabeth Fields Primary Care Elizabeth Fields: Harrel Lemon Other Clinician: Referring Elizabeth Fields: Harrel Lemon Treating Elizabeth Fields: Elizabeth Fields, Elizabeth Fields: 7 Visit Information History Since Last Visit Added or deleted any medications: No Patient Arrived: Ambulatory Any new allergies or adverse reactions: No Arrival Time: 15:40 Had a fall or experienced change in No Accompanied By: self activities of daily living that may affect Transfer Assistance: None risk of falls: Signs or symptoms of abuse/neglect since last visito No Hospitalized since last visit: No Pain Present Now: Yes Electronic Signature(s) Signed: 03/07/2018 4:47:10 PM By: Elizabeth Fields Entered By: Elizabeth Fields on 03/07/2018 15:41:11 Mascari, Klaudia C. ([id number]) -------------------------------------------------------------------------------- Clinic Level of Care Assessment Details Patient Name: Elizabeth Fields, [name] Date of Service: 03/07/2018 3:30 PM Medical Record Number: [id number] Patient Account Number: [id number] Date of Birth/Sex: 10-Apr-1931 (85 y.o. F) Treating RN: Elizabeth Fields Primary Care Elizabeth Fields: Harrel Lemon Other Clinician: Referring Elizabeth Fields: Harrel Lemon Treating Elizabeth Fields: Elizabeth Fields, Elizabeth Fields: 7 Clinic Level of Care Assessment Items TOOL 4 Quantity Score []  - Use when only an EandM is performed on FOLLOW-UP visit 0 ASSESSMENTS - Nursing Assessment / Reassessment X - Reassessment of Co-morbidities (includes updates in patient status) 1 10 X- 1 5 Reassessment of Adherence to Fields Plan ASSESSMENTS - Wound and Skin Assessment / Reassessment X - Simple Wound Assessment / Reassessment - one wound 1  5 []  - 0 Complex Wound Assessment / Reassessment - multiple wounds []  - 0 Dermatologic / Skin Assessment (not related to wound area) ASSESSMENTS - Focused Assessment []  - Circumferential Edema Measurements - multi extremities 0 []  - 0 Nutritional Assessment / Counseling / Intervention []  - 0 Lower Extremity Assessment (monofilament, tuning fork, pulses) []  - 0 Peripheral Arterial Disease Assessment (using hand held doppler) ASSESSMENTS - Ostomy and/or Continence Assessment and Care []  - Incontinence Assessment and Management 0 []  - 0 Ostomy Care Assessment and Management (repouching, etc.) PROCESS - Coordination of Care X - Simple Patient / Family Education for ongoing care 1 15 []  - 0 Complex (extensive) Patient / Family Education for ongoing care []  - 0 Staff obtains Programmer, systems, Records, Test Results / Process Orders []  - 0 Staff telephones HHA, Nursing Homes / Clarify orders / etc []  - 0 Routine Transfer to another Facility (non-emergent condition) []  - 0 Routine Hospital Admission (non-emergent condition) []  - 0 New Admissions / Biomedical engineer / Ordering NPWT, Apligraf, etc. []  - 0 Emergency Hospital Admission (emergent condition) X- 1 10 Simple Discharge Coordination Elizabeth Fields, [name] ([id number]) []  - 0 Complex (extensive) Discharge Coordination PROCESS - Special Needs []  - Pediatric / Minor Patient Management 0 []  - 0 Isolation Patient Management []  - 0 Hearing / Language / Visual special needs []  - 0 Assessment of Community assistance (transportation, D/C planning, etc.) []  - 0 Additional assistance / Altered mentation []  - 0 Support Surface(s) Assessment (bed, cushion, seat, etc.) INTERVENTIONS - Wound Cleansing / Measurement []  - Simple Wound Cleansing - one wound 0 X- 2 5 Complex Wound Cleansing - multiple wounds X- 1 5 Wound Imaging (photographs - any number of wounds) []  - 0 Wound Tracing (instead of photographs) []  - 0 Simple Wound  Measurement - one wound X- 2 5 Complex Wound Measurement - multiple wounds INTERVENTIONS - Wound Dressings  X - Small Wound Dressing one or multiple wounds 2 10 []  - 0 Medium Wound Dressing one or multiple wounds []  - 0 Large Wound Dressing one or multiple wounds []  - 0 Application of Medications - topical []  - 0 Application of Medications - injection INTERVENTIONS - Miscellaneous []  - External ear exam 0 []  - 0 Specimen Collection (cultures, biopsies, blood, body fluids, etc.) []  - 0 Specimen(s) / Culture(s) sent or taken to Lab for analysis []  - 0 Patient Transfer (multiple staff / Civil Service fast streamer / Similar devices) []  - 0 Simple Staple / Suture removal (25 or less) []  - 0 Complex Staple / Suture removal (26 or more) []  - 0 Hypo / Hyperglycemic Management (close monitor of Blood Glucose) []  - 0 Ankle / Brachial Index (ABI) - do not check if billed separately X- 1 5 Vital Signs [name], [name] ([id number]) Has the patient been seen at the hospital within the last three years: Yes Total Score: 95 Level Of Care: New/Established - Level 3 Electronic Signature(s) Signed: 03/07/2018 5:19:20 PM By: Elizabeth Fields Entered By: Elizabeth Fields on 03/07/2018 16:10:01 Elizabeth Fields, [name] ([id number]) -------------------------------------------------------------------------------- Encounter Discharge Information Details Patient Name: [name], [name] Date of Service: 03/07/2018 3:30 PM Medical Record Number: [id number] Patient Account Number: [id number] Date of Birth/Sex: 1931-02-08 (85 y.o. F) Treating RN: Elizabeth Fields Primary Care Elizabeth Fields: Harrel Lemon Other Clinician: Referring Elizabeth Fields: Harrel Lemon Treating Elizabeth Fields: Elizabeth Fields, Elizabeth Fields: 7 Encounter Discharge Information Items Discharge Condition: Stable Ambulatory Status: Ambulatory Discharge Destination: Home Transportation: Private Auto Accompanied By: self Schedule Follow-up Appointment: Yes Clinical  Summary of Care: Electronic Signature(s) Signed: 03/10/2018 4:00:49 PM By: Elizabeth Fields, BSN, RN, CWS, Kim RN, BSN Entered By: Elizabeth Fields, BSN, RN, CWS, Elizabeth Fields on 03/07/2018 16:18:23 Elizabeth Fields, Elizabeth Fields ([id number]) -------------------------------------------------------------------------------- Lower Extremity Assessment Details Patient Name: Elizabeth Fields, [name] Date of Service: 03/07/2018 3:30 PM Medical Record Number: [id number] Patient Account Number: [id number] Date of Birth/Sex: January 13, 1931 (85 y.o. F) Treating RN: Elizabeth Fields Primary Care Donalyn Schneeberger: Harrel Lemon Other Clinician: Referring Tyrah Broers: Harrel Lemon Treating Marketta Valadez/Extender: Elizabeth Fields, Elizabeth Fields: 7 Edema Assessment Assessed: [Left: No] [Right: No] Edema: [Left: Yes] [Right: Yes] Vascular Assessment Pulses: Dorsalis Pedis Palpable: [Left:Yes] [Right:Yes] Posterior Tibial Extremity colors, hair growth, and conditions: Extremity Color: [Left:Hyperpigmented] [Right:Hyperpigmented] Hair Growth on Extremity: [Left:No] [Right:No] Temperature of Extremity: [Left:Warm] [Right:Warm] Capillary Refill: [Left:< 3 seconds] [Right:< 3 seconds] Toe Nail Assessment Left: Right: Thick: No No Discolored: No No Deformed: No No Improper Length and Hygiene: No No Electronic Signature(s) Signed: 03/07/2018 4:47:10 PM By: Elizabeth Fields Entered By: Elizabeth Fields on 03/07/2018 15:47:35 Koeppen, Asher C. ([id number]) -------------------------------------------------------------------------------- Multi Wound Chart Details Patient Name: Elizabeth Fields, [name] Date of Service: 03/07/2018 3:30 PM Medical Record Number: [id number] Patient Account Number: [id number] Date of Birth/Sex: August 14, 1931 (85 y.o. F) Treating RN: Elizabeth Fields Primary Care Tavonte Seybold: Harrel Lemon Other Clinician: Referring Jule Whitsel: Harrel Lemon Treating Corey Caulfield/Extender: Elizabeth Fields, Elizabeth Fields: 7 Vital Signs Height(in): 61 Pulse(bpm): 81 Weight(lbs): 100 Blood  Pressure(mmHg): 129/69 Body Mass Index(BMI): 19 Temperature(F): 97.8 Respiratory Rate 16 (breaths/min): Photos: [1:No Photos] [2:No Photos] [N/A:N/A] Wound Location: [1:Right Lower Leg - Anterior] [2:Left Lower Leg - Anterior] [N/A:N/A] Wounding Event: [1:Trauma] [2:Trauma] [N/A:N/A] Primary Etiology: [1:Trauma, Other] [2:Trauma, Other] [N/A:N/A] Comorbid History: [1:Congestive Heart Failure, Coronary Artery Disease, End Stage Renal Disease] [2:Congestive Heart Failure, Coronary Artery Disease, End Stage Renal Disease] [N/A:N/A] Date Acquired: [1:12/26/2017] [2:11/17/2017] [N/A:N/A] Weeks of Fields: [1:7] [2:7] [N/A:N/A] Wound Status: [1:Open] [2:Open] [N/A:N/A] Measurements  L x W x D [1:0.5x0.2x0.1] [2:0.1x0.1x0.1] [N/A:N/A] (cm) Area (cm) : [1:0.079] [2:0.008] [N/A:N/A] Volume (cm) : [1:0.008] [2:0.001] [N/A:N/A] % Reduction in Area: [1:97.00%] [2:98.70%] [N/A:N/A] % Reduction in Volume: [1:96.90%] [2:98.40%] [N/A:N/A] Classification: [1:Full Thickness Without Exposed Support Structures] [2:Full Thickness Without Exposed Support Structures] [N/A:N/A] Exudate Amount: [1:Medium] [2:Medium] [N/A:N/A] Exudate Type: [1:Serous] [2:Serous] [N/A:N/A] Exudate Color: [1:amber] [2:amber] [N/A:N/A] Wound Margin: [1:Flat and Intact] [2:Flat and Intact] [N/A:N/A] Granulation Amount: [1:None Present (0%)] [2:None Present (0%)] [N/A:N/A] Necrotic Amount: [1:Large (67-100%)] [2:Medium (34-66%)] [N/A:N/A] Exposed Structures: [1:Fat Layer (Subcutaneous Tissue) Exposed: Yes Fascia: No Tendon: No Muscle: No Joint: No Bone: No] [2:Fat Layer (Subcutaneous Tissue) Exposed: Yes Fascia: No Tendon: No Muscle: No Joint: No Bone: No] [N/A:N/A] Epithelialization: [1:None] [2:None] [N/A:N/A] Periwound Skin Texture: [1:Excoriation: No Induration: No Callus: No Crepitus: No] [2:Excoriation: No Induration: No Callus: No Crepitus: No] [N/A:N/A] Rash: No Rash: No Scarring: No Scarring: No Periwound Skin  Moisture: Maceration: No Maceration: No N/A Dry/Scaly: No Dry/Scaly: No Periwound Skin Color: Hemosiderin Staining: Yes Hemosiderin Staining: Yes N/A Atrophie Blanche: No Atrophie Blanche: No Cyanosis: No Cyanosis: No Ecchymosis: No Ecchymosis: No Erythema: No Erythema: No Mottled: No Mottled: No Pallor: No Pallor: No Rubor: No Rubor: No Temperature: No Abnormality No Abnormality N/A Tenderness on Palpation: Yes Yes N/A Wound Preparation: Ulcer Cleansing: Ulcer Cleansing: N/A Rinsed/Irrigated with Saline Rinsed/Irrigated with Saline Topical Anesthetic Applied: Topical Anesthetic Applied: Other: lidocaine 4% Other: lidocaine 4% Fields Notes Electronic Signature(s) Signed: 03/07/2018 5:19:20 PM By: Elizabeth Fields Entered By: Elizabeth Fields on 03/07/2018 16:04:38 Elizabeth Fields, [name] ([id number]) -------------------------------------------------------------------------------- [place] Details Patient Name: Elizabeth Fields, [name] Date of Service: 03/07/2018 3:30 PM Medical Record Number: [id number] Patient Account Number: [id number] Date of Birth/Sex: January 15, 1931 (85 y.o. F) Treating RN: Elizabeth Fields Primary Care Undrea Shipes: Harrel Lemon Other Clinician: Referring Cassandria Drew: Harrel Lemon Treating Soffia Doshier/Extender: Elizabeth Fields, Elizabeth Fields: 7 Active Inactive Abuse / Safety / Falls / Self Care Management Nursing Diagnoses: Potential for falls Goals: Patient will not experience any injury related to falls Date Initiated: 01/17/2018 Target Resolution Date: 04/12/2018 Goal Status: Active Interventions: Assess fall risk on admission and as needed Notes: Orientation to the Wound Care Program Nursing Diagnoses: Knowledge deficit related to the wound healing center program Goals: Patient/caregiver will verbalize understanding of the [place] Program Date Initiated: 01/17/2018 Target Resolution Date: 04/12/2018 Goal Status:  Active Interventions: Provide education on orientation to the wound center Notes: Wound/Skin Impairment Nursing Diagnoses: Impaired tissue integrity Goals: Ulcer/skin breakdown will heal within 14 weeks Date Initiated: 01/17/2018 Target Resolution Date: 04/12/2018 Goal Status: Active Interventions: Assess patient/caregiver ability to obtain necessary supplies Elizabeth Fields, [name] ([id number]) Assess patient/caregiver ability to perform ulcer/skin care regimen upon admission and as needed Assess ulceration(s) every visit Notes: Electronic Signature(s) Signed: 03/07/2018 5:19:20 PM By: Elizabeth Fields Entered By: Elizabeth Fields on 03/07/2018 16:04:30 Elizabeth Fields, [name] ([id number]) -------------------------------------------------------------------------------- Pain Assessment Details Patient Name: Schnebly, Jerry C. Date of Service: 03/07/2018 3:30 PM Medical Record Number: [id number] Patient Account Number: [id number] Date of Birth/Sex: 29-Jan-1931 (85 y.o. F) Treating RN: Elizabeth Fields Primary Care Pavlos Yon: Harrel Lemon Other Clinician: Referring Shonnie Poudrier: Harrel Lemon Treating Geanine Vandekamp/Extender: Elizabeth Fields, Elizabeth Fields: 7 Active Problems Location of Pain Severity and Description of Pain Patient Has Paino Yes Site Locations Pain Location: Pain in Ulcers With Dressing Change: Yes Duration of the Pain. Constant / Intermittento Constant Rate the pain. Current Pain Level: 3 Character of Pain Describe the Pain: Sharp Pain Management and Medication Current Pain  Management: Electronic Signature(s) Signed: 03/07/2018 4:47:10 PM By: Elizabeth Fields Entered By: Elizabeth Fields on 03/07/2018 15:41:24 Elizabeth Fields, Elizabeth Fields Grayer ([id number]) -------------------------------------------------------------------------------- Patient/Caregiver Education Details Patient Name: Ground, Renae C. Date of Service: 03/07/2018 3:30 PM Medical Record Number: [id number] Patient Account Number: [id number] Date of  Birth/Gender: [birth year] (85 y.o. F) Treating RN: Elizabeth Fields Primary Care Physician: Harrel Lemon Other Clinician: Referring Physician: Harrel Lemon Treating Physician/Extender: Sharalyn Ink in Fields: 7 Education Assessment Education Provided To: Patient Education Topics Provided Wound/Skin Impairment: Handouts: Caring for Your Ulcer Methods: Demonstration, Explain/Verbal Responses: State content correctly Electronic Signature(s) Signed: 03/07/2018 5:19:20 PM By: Elizabeth Fields Entered By: Elizabeth Fields on 03/07/2018 16:04:55 Nanninga, Reilyn C. ([id number]) -------------------------------------------------------------------------------- Wound Assessment Details Patient Name: Lage, Kathe C. Date of Service: 03/07/2018 3:30 PM Medical Record Number: [id number] Patient Account Number: [id number] Date of Birth/Sex: March 16, 1931 (85 y.o. F) Treating RN: Elizabeth Fields Primary Care Seaton Hofmann: Harrel Lemon Other Clinician: Referring Lila Lufkin: Harrel Lemon Treating Freida Nebel/Extender: Elizabeth Fields, Elizabeth Fields: 7 Wound Status Wound Number: 1 Primary Trauma, Other Etiology: Wound Location: Right Lower Leg - Anterior Wound Open Wounding Event: Trauma Status: Date Acquired: 12/26/2017 Comorbid Congestive Heart Failure, Coronary Artery Weeks Of Fields: 7 History: Disease, End Stage Renal Disease Clustered Wound: No Photos Photo Uploaded By: Elizabeth Fields on 03/10/2018 08:14:27 Wound Measurements Length: (cm) 0.5 Width: (cm) 0.2 Depth: (cm) 0.1 Area: (cm) 0.079 Volume: (cm) 0.008 % Reduction in Area: 97% % Reduction in Volume: 96.9% Epithelialization: None Tunneling: No Undermining: No Wound Description Full Thickness Without Exposed Support Classification: Structures Wound Margin: Flat and Intact Exudate Medium Amount: Exudate Type: Serous Exudate Color: amber Foul Odor After Cleansing: No Slough/Fibrino Yes Wound Bed Granulation  Amount: None Present (0%) Exposed Structure Necrotic Amount: Large (67-100%) Fascia Exposed: No Necrotic Quality: Adherent Slough Fat Layer (Subcutaneous Tissue) Exposed: Yes Tendon Exposed: No Muscle Exposed: No Joint Exposed: No Bone Exposed: No Matkins, Lurdes C. ([id number]) Periwound Skin Texture Texture Color No Abnormalities Noted: No No Abnormalities Noted: No Callus: No Atrophie Blanche: No Crepitus: No Cyanosis: No Excoriation: No Ecchymosis: No Induration: No Erythema: No Rash: No Hemosiderin Staining: Yes Scarring: No Mottled: No Pallor: No Moisture Rubor: No No Abnormalities Noted: No Dry / Scaly: No Temperature / Pain Maceration: No Temperature: No Abnormality Tenderness on Palpation: Yes Wound Preparation Ulcer Cleansing: Rinsed/Irrigated with Saline Topical Anesthetic Applied: Other: lidocaine 4%, Fields Notes Wound #1 (Right, Anterior Lower Leg) 1. Cleansed with: Clean wound with Normal Saline 4. Dressing Applied: Santyl Ointment 5. Secondary Dressing Applied ABD Pad 7. Secured with Other (specify in notes) Notes stretch net to secure Electronic Signature(s) Signed: 03/07/2018 4:47:10 PM By: Elizabeth Fields Entered By: Elizabeth Fields on 03/07/2018 15:46:35 Luby, Krystian C. ([id number]) -------------------------------------------------------------------------------- Wound Assessment Details Patient Name: Blasius, Cyrah C. Date of Service: 03/07/2018 3:30 PM Medical Record Number: [id number] Patient Account Number: [id number] Date of Birth/Sex: 1931-09-19 (85 y.o. F) Treating RN: Elizabeth Fields Primary Care Maxxon Schwanke: Harrel Lemon Other Clinician: Referring Kaidyn Hernandes: Harrel Lemon Treating Ammanda Dobbins/Extender: Elizabeth Fields, Elizabeth Fields: 7 Wound Status Wound Number: 2 Primary Trauma, Other Etiology: Wound Location: Left Lower Leg - Anterior Wound Open Wounding Event: Trauma Status: Date Acquired: 11/17/2017 Comorbid Congestive Heart  Failure, Coronary Artery Weeks Of Fields: 7 History: Disease, End Stage Renal Disease Clustered Wound: No Photos Photo Uploaded By: Elizabeth Fields on 03/10/2018 08:14:57 Wound Measurements Length: (cm) 0.1 Width: (cm) 0.1 Depth: (cm) 0.1 Area: (cm) 0.008 Volume: (cm) 0.001 % Reduction in Area: 98.7% % Reduction  in Volume: 98.4% Epithelialization: None Tunneling: No Undermining: No Wound Description Full Thickness Without Exposed Support Classification: Structures Wound Margin: Flat and Intact Exudate Medium Amount: Exudate Type: Serous Exudate Color: amber Foul Odor After Cleansing: No Slough/Fibrino Yes Wound Bed Granulation Amount: None Present (0%) Exposed Structure Necrotic Amount: Medium (34-66%) Fascia Exposed: No Necrotic Quality: Adherent Slough Fat Layer (Subcutaneous Tissue) Exposed: Yes Tendon Exposed: No Muscle Exposed: No Joint Exposed: No Bone Exposed: No Julius, Sahalie C. ([id number]) Periwound Skin Texture Texture Color No Abnormalities Noted: No No Abnormalities Noted: No Callus: No Atrophie Blanche: No Crepitus: No Cyanosis: No Excoriation: No Ecchymosis: No Induration: No Erythema: No Rash: No Hemosiderin Staining: Yes Scarring: No Mottled: No Pallor: No Moisture Rubor: No No Abnormalities Noted: No Dry / Scaly: No Temperature / Pain Maceration: No Temperature: No Abnormality Tenderness on Palpation: Yes Wound Preparation Ulcer Cleansing: Rinsed/Irrigated with Saline Topical Anesthetic Applied: Other: lidocaine 4%, Fields Notes Wound #2 (Left, Anterior Lower Leg) 1. Cleansed with: Clean wound with Normal Saline 4. Dressing Applied: Santyl Ointment 5. Secondary Dressing Applied ABD Pad 7. Secured with Other (specify in notes) Notes stretch net to secure Electronic Signature(s) Signed: 03/07/2018 4:47:10 PM By: Elizabeth Fields Entered By: Elizabeth Fields on 03/07/2018 15:46:59 Hasz, Adaliz C.  ([id number]) -------------------------------------------------------------------------------- Vitals Details Patient Name: Moeser, Heydi C. Date of Service: 03/07/2018 3:30 PM Medical Record Number: [id number] Patient Account Number: [id number] Date of Birth/Sex: 1931/06/01 (85 y.o. F) Treating RN: Elizabeth Fields Primary Care Imogine Carvell: Harrel Lemon Other Clinician: Referring Rylen Hou: Harrel Lemon Treating Zillah Alexie/Extender: Elizabeth Fields, Elizabeth Fields: 7 Vital Signs Time Taken: 15:45 Temperature (F): 97.8 Height (in): 61 Pulse (bpm): 81 Weight (lbs): 100 Respiratory Rate (breaths/min): 16 Body Mass Index (BMI): 18.9 Blood Pressure (mmHg): 129/69 Reference Range: 80 - 120 mg / dl Electronic Signature(s) Signed: 03/07/2018 4:47:10 PM By: Elizabeth Fields Entered By: Elizabeth Fields on 03/07/2018 15:48:01

## 2018-03-13 DIAGNOSIS — I480 Paroxysmal atrial fibrillation: Secondary | ICD-10-CM | POA: Diagnosis not present

## 2018-03-13 DIAGNOSIS — N183 Chronic kidney disease, stage 3 (moderate): Secondary | ICD-10-CM | POA: Diagnosis not present

## 2018-03-13 DIAGNOSIS — S81802A Unspecified open wound, left lower leg, initial encounter: Secondary | ICD-10-CM | POA: Diagnosis not present

## 2018-03-13 DIAGNOSIS — S81801A Unspecified open wound, right lower leg, initial encounter: Secondary | ICD-10-CM | POA: Diagnosis not present

## 2018-03-13 DIAGNOSIS — L97812 Non-pressure chronic ulcer of other part of right lower leg with fat layer exposed: Secondary | ICD-10-CM | POA: Diagnosis not present

## 2018-03-13 DIAGNOSIS — L97822 Non-pressure chronic ulcer of other part of left lower leg with fat layer exposed: Secondary | ICD-10-CM | POA: Diagnosis not present

## 2018-03-13 DIAGNOSIS — Z7901 Long term (current) use of anticoagulants: Secondary | ICD-10-CM | POA: Diagnosis not present

## 2018-03-14 ENCOUNTER — Encounter: Payer: PPO | Attending: Physician Assistant | Admitting: Physician Assistant

## 2018-03-14 DIAGNOSIS — N186 End stage renal disease: Secondary | ICD-10-CM | POA: Diagnosis not present

## 2018-03-14 DIAGNOSIS — E1122 Type 2 diabetes mellitus with diabetic chronic kidney disease: Secondary | ICD-10-CM | POA: Insufficient documentation

## 2018-03-14 DIAGNOSIS — E11622 Type 2 diabetes mellitus with other skin ulcer: Secondary | ICD-10-CM | POA: Diagnosis not present

## 2018-03-14 DIAGNOSIS — L97812 Non-pressure chronic ulcer of other part of right lower leg with fat layer exposed: Secondary | ICD-10-CM | POA: Diagnosis not present

## 2018-03-14 DIAGNOSIS — Z809 Family history of malignant neoplasm, unspecified: Secondary | ICD-10-CM | POA: Insufficient documentation

## 2018-03-14 DIAGNOSIS — I132 Hypertensive heart and chronic kidney disease with heart failure and with stage 5 chronic kidney disease, or end stage renal disease: Secondary | ICD-10-CM | POA: Diagnosis not present

## 2018-03-14 DIAGNOSIS — Z7901 Long term (current) use of anticoagulants: Secondary | ICD-10-CM | POA: Diagnosis not present

## 2018-03-14 DIAGNOSIS — I480 Paroxysmal atrial fibrillation: Secondary | ICD-10-CM | POA: Diagnosis not present

## 2018-03-14 DIAGNOSIS — Z8249 Family history of ischemic heart disease and other diseases of the circulatory system: Secondary | ICD-10-CM | POA: Diagnosis not present

## 2018-03-14 DIAGNOSIS — I509 Heart failure, unspecified: Secondary | ICD-10-CM | POA: Diagnosis not present

## 2018-03-14 DIAGNOSIS — I2510 Atherosclerotic heart disease of native coronary artery without angina pectoris: Secondary | ICD-10-CM | POA: Insufficient documentation

## 2018-03-14 DIAGNOSIS — L97822 Non-pressure chronic ulcer of other part of left lower leg with fat layer exposed: Secondary | ICD-10-CM | POA: Diagnosis not present

## 2018-03-16 NOTE — Progress Notes (Signed)
IMOGINE, CARVELL ([id number]) Visit Report for 03/14/2018 Arrival Information Details Patient Name: Elizabeth Fields, [name] Date of Service: 03/14/2018 1:45 PM Medical Record Number: [id number] Patient Account Number: [id number] Date of Birth/Sex: 1931-04-25 (86 y.o. F) Treating RN: Army Melia Primary Care Daemien Fronczak: Harrel Lemon Other Clinician: Referring Cephus Tupy: Harrel Lemon Treating Ranisha Allaire/Extender: Melburn Hake, HOYT Weeks in Treatment: 8 Visit Information History Since Last Visit All ordered tests and consults were completed: No Patient Arrived: Ambulatory Added or deleted any medications: No Arrival Time: 13:54 Any new allergies or adverse reactions: No Accompanied By: self Had a fall or experienced change in No Transfer Assistance: None activities of daily living that may affect risk of falls: Signs or symptoms of abuse/neglect since last visito No Hospitalized since last visit: No Implantable device outside of the clinic excluding No cellular tissue based products placed in the center since last visit: Has Dressing in Place as Prescribed: Yes Pain Present Now: Yes Electronic Signature(s) Signed: 03/14/2018 3:17:07 PM By: Army Melia Entered By: Army Melia on 03/14/2018 13:55:03 Elizabeth Fields, [name] ([id number]) -------------------------------------------------------------------------------- Encounter Discharge Information Details Patient Name: Elizabeth Fields, [name] Date of Service: 03/14/2018 1:45 PM Medical Record Number: [id number] Patient Account Number: [id number] Date of Birth/Sex: 17-Jul-1931 (86 y.o. F) Treating RN: Cornell Barman Primary Care Navon Kotowski: Harrel Lemon Other Clinician: Referring Dior Stepter: Harrel Lemon Treating Elyanah Farino/Extender: Melburn Hake, HOYT Weeks in Treatment: 8 Encounter Discharge Information Items Post Procedure Vitals Discharge Condition: Stable Temperature (F): 97.4 Ambulatory Status: Ambulatory Pulse (bpm): 83 Discharge Destination: Home Respiratory Rate  (breaths/min): 16 Transportation: Private Auto Blood Pressure (mmHg): 122/58 Accompanied By: self Schedule Follow-up Appointment: Yes Clinical Summary of Care: Electronic Signature(s) Signed: 03/14/2018 4:58:22 PM By: Gretta Cool, BSN, RN, CWS, Kim RN, BSN Entered By: Gretta Cool, BSN, RN, CWS, Kim on 03/14/2018 14:35:59 Statz, Tanna Savoy ([id number]) -------------------------------------------------------------------------------- Lower Extremity Assessment Details Patient Name: [name] Date of Service: 03/14/2018 1:45 PM Medical Record Number: [id number] Patient Account Number: [id number] Date of Birth/Sex: 1931/01/28 (86 y.o. F) Treating RN: Army Melia Primary Care Anil Havard: Harrel Lemon Other Clinician: Referring Darran Gabay: Harrel Lemon Treating Mitali Shenefield/Extender: Melburn Hake, HOYT Weeks in Treatment: 8 Edema Assessment Assessed: [Left: No] [Right: No] Edema: [Left: No] [Right: No] Vascular Assessment Pulses: Dorsalis Pedis Palpable: [Left:Yes] [Right:Yes] Posterior Tibial Extremity colors, hair growth, and conditions: Extremity Color: [Left:Hyperpigmented] [Right:Hyperpigmented] Hair Growth on Extremity: [Left:No] [Right:No] Temperature of Extremity: [Left:Warm] [Right:Warm] Capillary Refill: [Left:< 3 seconds] [Right:< 3 seconds] Toe Nail Assessment Left: Right: Thick: No No Discolored: No No Deformed: No No Improper Length and Hygiene: No No Electronic Signature(s) Signed: 03/14/2018 3:17:07 PM By: Army Melia Entered By: Army Melia on 03/14/2018 13:59:59 Elizabeth Fields, [name] ([id number]) -------------------------------------------------------------------------------- Multi Wound Chart Details Patient Name: [name] Date of Service: 03/14/2018 1:45 PM Medical Record Number: [id number] Patient Account Number: [id number] Date of Birth/Sex: 1931/03/14 (86 y.o. F) Treating RN: Montey Hora Primary Care Miyonna Ormiston: Harrel Lemon Other Clinician: Referring Danney Bungert: Harrel Lemon Treating Nafeesah Lapaglia/Extender: Melburn Hake, HOYT Weeks in Treatment: 8 Vital Signs Height(in): 61 Pulse(bpm): 4 Weight(lbs): 100 Blood Pressure(mmHg): 122/58 Body Mass Index(BMI): 19 Temperature(F): 97.4 Respiratory Rate 16 (breaths/min): Photos: [1:No Photos] [2:No Photos] [N/A:N/A] Wound Location: [1:Right Lower Leg - Anterior] [2:Left Lower Leg - Anterior] [N/A:N/A] Wounding Event: [1:Trauma] [2:Trauma] [N/A:N/A] Primary Etiology: [1:Trauma, Other] [2:Trauma, Other] [N/A:N/A] Comorbid History: [1:Congestive Heart Failure, Coronary Artery Disease, End Stage Renal Disease] [2:Congestive Heart Failure, Coronary Artery Disease, End Stage Renal Disease] [N/A:N/A] Date Acquired: [1:12/26/2017] [2:11/17/2017] [N/A:N/A] Weeks of Treatment: [1:8] [2:8] [N/A:N/A] Wound Status: [  1:Open] [2:Open] [N/A:N/A] Measurements L x W x D [1:1x0.6x0.1] [2:0.1x0.1x0.1] [N/A:N/A] (cm) Area (cm) : [1:0.471] [2:0.008] [N/A:N/A] Volume (cm) : [1:0.047] [2:0.001] [N/A:N/A] % Reduction in Area: [1:81.80%] [2:98.70%] [N/A:N/A] % Reduction in Volume: [1:81.90%] [2:98.40%] [N/A:N/A] Classification: [1:Full Thickness Without Exposed Support Structures] [2:Full Thickness Without Exposed Support Structures] [N/A:N/A] Exudate Amount: [1:Medium] [2:Medium] [N/A:N/A] Exudate Type: [1:Serous] [2:Serous] [N/A:N/A] Exudate Color: [1:amber] [2:amber] [N/A:N/A] Wound Margin: [1:Flat and Intact] [2:Flat and Intact] [N/A:N/A] Granulation Amount: [1:None Present (0%)] [2:None Present (0%)] [N/A:N/A] Necrotic Amount: [1:Large (67-100%)] [2:Medium (34-66%)] [N/A:N/A] Necrotic Tissue: [1:Adherent Slough] [2:Eschar] [N/A:N/A] Exposed Structures: [1:Fat Layer (Subcutaneous Tissue) Exposed: Yes Fascia: No Tendon: No Muscle: No Joint: No Bone: No] [2:Fat Layer (Subcutaneous Tissue) Exposed: Yes Fascia: No Tendon: No Muscle: No Joint: No Bone: No] [N/A:N/A] Epithelialization: [1:None] [2:None] [N/A:N/A] Periwound Skin  Texture: [1:Excoriation: No Induration: No Callus: No Crepitus: No] [2:Excoriation: No Induration: No Callus: No Crepitus: No] [N/A:N/A] Rash: No Rash: No Scarring: No Scarring: No Periwound Skin Moisture: Maceration: No Maceration: No N/A Dry/Scaly: No Dry/Scaly: No Periwound Skin Color: Hemosiderin Staining: Yes Hemosiderin Staining: Yes N/A Atrophie Blanche: No Atrophie Blanche: No Cyanosis: No Cyanosis: No Ecchymosis: No Ecchymosis: No Erythema: No Erythema: No Mottled: No Mottled: No Pallor: No Pallor: No Rubor: No Rubor: No Temperature: No Abnormality No Abnormality N/A Tenderness on Palpation: Yes Yes N/A Wound Preparation: Ulcer Cleansing: Ulcer Cleansing: N/A Rinsed/Irrigated with Saline Rinsed/Irrigated with Saline Topical Anesthetic Applied: Topical Anesthetic Applied: Other: lidocaine 4% Other: lidocaine 4% Treatment Notes Electronic Signature(s) Signed: 03/14/2018 4:27:08 PM By: Montey Hora Entered By: Montey Hora on 03/14/2018 14:29:56 Elizabeth Fields, [name] ([id number]) -------------------------------------------------------------------------------- [place] Details Patient Name: Lady, Ashlay Fields. Date of Service: 03/14/2018 1:45 PM Medical Record Number: [id number] Patient Account Number: [id number] Date of Birth/Sex: 1931-01-16 (86 y.o. F) Treating RN: Montey Hora Primary Care Sarin [place]: Harrel Lemon Other Clinician: Referring Kearia Yin: Harrel Lemon Treating Boaz Berisha/Extender: Melburn Hake, HOYT Weeks in Treatment: 8 Active Inactive Abuse / Safety / Falls / Self Care Management Nursing Diagnoses: Potential for falls Goals: Patient will not experience any injury related to falls Date Initiated: 01/17/2018 Target Resolution Date: 04/12/2018 Goal Status: Active Interventions: Assess fall risk on admission and as needed Notes: Orientation to the Wound Care Program Nursing Diagnoses: Knowledge deficit related to the wound healing  center program Goals: Patient/caregiver will verbalize understanding of the [place] Program Date Initiated: 01/17/2018 Target Resolution Date: 04/12/2018 Goal Status: Active Interventions: Provide education on orientation to the wound center Notes: Wound/Skin Impairment Nursing Diagnoses: Impaired tissue integrity Goals: Ulcer/skin breakdown will heal within 14 weeks Date Initiated: 01/17/2018 Target Resolution Date: 04/12/2018 Goal Status: Active Interventions: Assess patient/caregiver ability to obtain necessary supplies Elizabeth Fields, [name] ([id number]) Assess patient/caregiver ability to perform ulcer/skin care regimen upon admission and as needed Assess ulceration(s) every visit Notes: Electronic Signature(s) Signed: 03/14/2018 4:27:08 PM By: Montey Hora Entered By: Montey Hora on 03/14/2018 14:27:42 Elizabeth Fields, [name] ([id number]) -------------------------------------------------------------------------------- Pain Assessment Details Patient Name: Elizabeth Fields, [name] Date of Service: 03/14/2018 1:45 PM Medical Record Number: [id number] Patient Account Number: [id number] Date of Birth/Sex: 1931-11-11 (86 y.o. F) Treating RN: Army Melia Primary Care Dnya Hickle: Harrel Lemon Other Clinician: Referring Amatullah Christy: Harrel Lemon Treating Joleah Kosak/Extender: Melburn Hake, HOYT Weeks in Treatment: 8 Active Problems Location of Pain Severity and Description of Pain Patient Has Paino Yes Site Locations Pain Location: Pain in Ulcers With Dressing Change: Yes Duration of the Pain. Constant / Intermittento Intermittent Rate the pain. Current Pain Level: 3 Pain Management and  Medication Current Pain Management: Electronic Signature(s) Signed: 03/14/2018 3:17:07 PM By: Army Melia Entered By: Army Melia on 03/14/2018 13:55:44 Elizabeth Fields, [name] ([id number]) -------------------------------------------------------------------------------- Patient/Caregiver Education Details Patient  Name: Elizabeth Fields, [name] Date of Service: 03/14/2018 1:45 PM Medical Record Number: [id number] Patient Account Number: [id number] Date of Birth/Gender: 1931-07-15 (85 y.o. F) Treating RN: Montey Hora Primary Care Physician: Harrel Lemon Other Clinician: Referring Physician: Harrel Lemon Treating Physician/Extender: Sharalyn Ink in Treatment: 8 Education Assessment Education Provided To: Patient Education Topics Provided Wound/Skin Impairment: Handouts: Other: wound care as ordered Methods: Demonstration, Explain/Verbal Responses: State content correctly Electronic Signature(s) Signed: 03/14/2018 4:27:08 PM By: Montey Hora Entered By: Montey Hora on 03/14/2018 14:32:41 Elizabeth Fields, [name] ([id number]) -------------------------------------------------------------------------------- Wound Assessment Details Patient Name: Elizabeth Fields, [name] Date of Service: 03/14/2018 1:45 PM Medical Record Number: [id number] Patient Account Number: [id number] Date of Birth/Sex: February 20, 1931 (86 y.o. F) Treating RN: Army Melia Primary Care Shamyah Stantz: Harrel Lemon Other Clinician: Referring Arpita Fentress: Harrel Lemon Treating [place] Straus/Extender: Melburn Hake, HOYT Weeks in Treatment: 8 Wound Status Wound Number: 1 Primary Trauma, Other Etiology: Wound Location: Right Lower Leg - Anterior Wound Open Wounding Event: Trauma Status: Date Acquired: 12/26/2017 Comorbid Congestive Heart Failure, Coronary Artery Weeks Of Treatment: 8 History: Disease, End Stage Renal Disease Clustered Wound: No Photos Photo Uploaded By: Army Melia on 03/14/2018 15:11:29 Wound Measurements Length: (cm) 1 Width: (cm) 0.6 Depth: (cm) 0.1 Area: (cm) 0.471 Volume: (cm) 0.047 % Reduction in Area: 81.8% % Reduction in Volume: 81.9% Epithelialization: None Tunneling: No Undermining: No Wound Description Full Thickness Without Exposed Support Foul Odo Classification: Structures Slough/F Wound Margin: Flat and  Intact Exudate Medium Amount: Exudate Type: Serous Exudate Color: amber r After Cleansing: No ibrino Yes Wound Bed Granulation Amount: None Present (0%) Exposed Structure Necrotic Amount: Large (67-100%) Fascia Exposed: No Necrotic Quality: Adherent Slough Fat Layer (Subcutaneous Tissue) Exposed: Yes Tendon Exposed: No Muscle Exposed: No Joint Exposed: No Bone Exposed: No Gentile, Estie Fields. ([id number]) Periwound Skin Texture Texture Color No Abnormalities Noted: No No Abnormalities Noted: No Callus: No Atrophie Blanche: No Crepitus: No Cyanosis: No Excoriation: No Ecchymosis: No Induration: No Erythema: No Rash: No Hemosiderin Staining: Yes Scarring: No Mottled: No Pallor: No Moisture Rubor: No No Abnormalities Noted: No Dry / Scaly: No Temperature / Pain Maceration: No Temperature: No Abnormality Tenderness on Palpation: Yes Wound Preparation Ulcer Cleansing: Rinsed/Irrigated with Saline Topical Anesthetic Applied: Other: lidocaine 4%, Treatment Notes Wound #1 (Right, Anterior Lower Leg) Notes santyl, telfa, conform Electronic Signature(s) Signed: 03/14/2018 3:17:07 PM By: Army Melia Entered By: Army Melia on 03/14/2018 13:59:20 Elizabeth Fields, [name] ([id number]) -------------------------------------------------------------------------------- Wound Assessment Details Patient Name: Bahena, Bernell Fields. Date of Service: 03/14/2018 1:45 PM Medical Record Number: [id number] Patient Account Number: [id number] Date of Birth/Sex: [birth year] (86 y.o. F) Treating RN: Army Melia Primary Care Marwin Primmer: Harrel Lemon Other Clinician: Referring Detrell Umscheid: Harrel Lemon Treating Cheron Pasquarelli/Extender: Melburn Hake, HOYT Weeks in Treatment: 8 Wound Status Wound Number: 2 Primary Trauma, Other Etiology: Wound Location: Left Lower Leg - Anterior Wound Open Wounding Event: Trauma Status: Date Acquired: 11/17/2017 Comorbid Congestive Heart Failure, Coronary Artery Weeks Of Treatment:  8 History: Disease, End Stage Renal Disease Clustered Wound: No Photos Photo Uploaded By: Army Melia on 03/14/2018 15:11:30 Wound Measurements Length: (cm) 0.1 Width: (cm) 0.1 Depth: (cm) 0.1 Area: (cm) 0.008 Volume: (cm) 0.001 % Reduction in Area: 98.7% % Reduction in Volume: 98.4% Epithelialization: None Tunneling: No Undermining: No Wound Description Full Thickness Without Exposed Support Foul Odo Classification: Structures Slough/F  Wound Margin: Flat and Intact Exudate Medium Amount: Exudate Type: Serous Exudate Color: amber r After Cleansing: No ibrino Yes Wound Bed Granulation Amount: None Present (0%) Exposed Structure Necrotic Amount: Medium (34-66%) Fascia Exposed: No Necrotic Quality: Eschar Fat Layer (Subcutaneous Tissue) Exposed: Yes Tendon Exposed: No Muscle Exposed: No Joint Exposed: No Bone Exposed: No Iverson, Avi Fields. ([id number]) Periwound Skin Texture Texture Color No Abnormalities Noted: No No Abnormalities Noted: No Callus: No Atrophie Blanche: No Crepitus: No Cyanosis: No Excoriation: No Ecchymosis: No Induration: No Erythema: No Rash: No Hemosiderin Staining: Yes Scarring: No Mottled: No Pallor: No Moisture Rubor: No No Abnormalities Noted: No Dry / Scaly: No Temperature / Pain Maceration: No Temperature: No Abnormality Tenderness on Palpation: Yes Wound Preparation Ulcer Cleansing: Rinsed/Irrigated with Saline Topical Anesthetic Applied: Other: lidocaine 4%, Treatment Notes Wound #2 (Left, Anterior Lower Leg) Notes santyl, telfa, conform Electronic Signature(s) Signed: 03/14/2018 3:17:07 PM By: Army Melia Entered By: Army Melia on 03/14/2018 13:59:33 Nurse, Khrystyna Fields. ([id number]) -------------------------------------------------------------------------------- Vitals Details Patient Name: Caldeira, Voncille Fields. Date of Service: 03/14/2018 1:45 PM Medical Record Number: [id number] Patient Account Number: [id number] Date of  Birth/Sex: [birth year] (86 y.o. F) Treating RN: Army Melia Primary Care Lynetta Tomczak: Harrel Lemon Other Clinician: Referring Khamiya Varin: Harrel Lemon Treating Glorianne Proctor/Extender: Melburn Hake, HOYT Weeks in Treatment: 8 Vital Signs Time Taken: 13:55 Temperature (F): 97.4 Height (in): 61 Pulse (bpm): 83 Weight (lbs): 100 Respiratory Rate (breaths/min): 16 Body Mass Index (BMI): 18.9 Blood Pressure (mmHg): 122/58 Reference Range: 80 - 120 mg / dl Electronic Signature(s) Signed: 03/14/2018 3:17:07 PM By: Army Melia Entered By: Army Melia on 03/14/2018 13:55:58

## 2018-03-17 NOTE — Progress Notes (Signed)
TIARNA, KOPPEN ([id number]) Visit Report for 01/24/2018 Chief Complaint Document Details Patient Name: Elizabeth Fields, [name] Date of Service: 01/24/2018 1:00 PM Medical Record Number: [id number] Patient Account Number: [id number] Date of Birth/Sex: 1931/04/28 (86 y.o. F) Treating RN: Harold Barban Primary Care Provider: Harrel Lemon Other Clinician: Referring Provider: Harrel Lemon Treating Provider/Extender: Melburn Hake, Jessikah Dicker Weeks in Treatment: 1 Information Obtained from: Patient Chief Complaint Bilateral LE Ulcers Electronic Signature(s) Signed: 01/30/2018 9:23:56 AM By: Worthy Keeler PA-C Entered By: Worthy Keeler on 01/24/2018 13:08:01 [place], Mallie C. ([id number]) -------------------------------------------------------------------------------- Debridement Details Patient Name: [name], [name] Date of Service: 01/24/2018 1:00 PM Medical Record Number: [id number] Patient Account Number: [id number] Date of Birth/Sex: 05/07/1931 (86 y.o. F) Treating RN: Harold Barban Primary Care Provider: Harrel Lemon Other Clinician: Referring Provider: Harrel Lemon Treating Provider/Extender: Melburn Hake, Aharon Carriere Weeks in Treatment: 1 Debridement Performed for Wound #1 Right,Anterior Lower Leg Assessment: Performed By: Physician STONE III, Lailyn Appelbaum E., PA-C Debridement Type: Debridement Level of Consciousness (Pre- Awake and Alert procedure): Pre-procedure Verification/Time Yes - 13:32 Out Taken: Start Time: 13:32 Pain Control: Lidocaine Total Area Debrided (L x W): 1.6 (cm) x 1 (cm) = 1.6 (cm) Tissue and other material Viable, Non-Viable, Slough, Subcutaneous, Slough debrided: Level: Skin/Subcutaneous Tissue Debridement Description: Excisional Instrument: Curette Bleeding: Minimum Hemostasis Achieved: Pressure End Time: 13:37 Procedural Pain: 0 Post Procedural Pain: 0 Response to Treatment: Procedure was tolerated well Level of Consciousness Awake and Alert (Post-procedure): Post  Debridement Measurements of Total Wound Length: (cm) 1.6 Width: (cm) 1 Depth: (cm) 0.1 Volume: (cm) 0.126 Character of Wound/Ulcer Post Debridement: Improved Post Procedure Diagnosis Same as Pre-procedure Electronic Signature(s) Signed: 01/27/2018 5:13:49 PM By: Harold Barban Signed: 01/30/2018 9:23:56 AM By: Worthy Keeler PA-C Entered By: Harold Barban on 01/24/2018 13:34:37 Bartram, Mckinleigh C. ([id number]) -------------------------------------------------------------------------------- Debridement Details Patient Name: Granada, [name] Date of Service: 01/24/2018 1:00 PM Medical Record Number: [id number] Patient Account Number: [id number] Date of Birth/Sex: [birth year] (86 y.o. F) Treating RN: Harold Barban Primary Care Provider: Harrel Lemon Other Clinician: Referring Provider: Harrel Lemon Treating Provider/Extender: Melburn Hake, Tien Spooner Weeks in Treatment: 1 Debridement Performed for Wound #2 Left,Anterior Lower Leg Assessment: Performed By: Physician STONE III, Mercedes Fort E., PA-C Debridement Type: Debridement Level of Consciousness (Pre- Awake and Alert procedure): Pre-procedure Verification/Time Yes - 13:32 Out Taken: Start Time: 13:32 Pain Control: Lidocaine Total Area Debrided (L x W): 1.5 (cm) x 1.4 (cm) = 2.1 (cm) Tissue and other material Viable, Non-Viable, Slough, Subcutaneous, Slough debrided: Level: Skin/Subcutaneous Tissue Debridement Description: Excisional Instrument: Curette Bleeding: Minimum Hemostasis Achieved: Pressure End Time: 13:37 Procedural Pain: 0 Post Procedural Pain: 0 Response to Treatment: Procedure was tolerated well Level of Consciousness Awake and Alert (Post-procedure): Post Debridement Measurements of Total Wound Length: (cm) 1.5 Width: (cm) 1.4 Depth: (cm) 0.3 Volume: (cm) 0.495 Character of Wound/Ulcer Post Debridement: Improved Post Procedure Diagnosis Same as Pre-procedure Electronic Signature(s) Signed: 02/03/2018  12:31:00 PM By: Worthy Keeler PA-C Signed: 03/17/2018 10:53:34 AM By: Harold Barban Previous Signature: 01/27/2018 5:13:49 PM Version By: Harold Barban Previous Signature: 01/30/2018 9:23:56 AM Version By: Worthy Keeler PA-C Entered By: Worthy Keeler on 02/03/2018 12:27:16 Cardozo, Hiliana C. ([id number]) -------------------------------------------------------------------------------- HPI Details Patient Name: [name], [name] Date of Service: 01/24/2018 1:00 PM Medical Record Number: [id number] Patient Account Number: [id number] Date of Birth/Sex: Aug 05, 1931 (86 y.o. F) Treating RN: Harold Barban Primary Care Provider: Harrel Lemon Other Clinician: Referring Provider: Harrel Lemon Treating Provider/Extender: Melburn Hake, Ingeborg Fite Weeks in Treatment: 1 History of  Present Illness HPI Description: 01/17/18 on evaluation today patient presents for evaluation of two ulcers both on the anterior portion of her lower extremities one right and one left. The right she has had for about three weeks the left for about eight weeks and both occurred as a result of her traumatizing her shins on the bottom of the card were trying to get out of the car. She states this is never happen with any other car doors before but she has been having some issues with this sharp area on the store in particular. Both occurred in the similar situation where she was trying not to open the door the whole way in order to avoid damaging anybody else's car beside them. She is been using peroxide initially to clean the wound although she has converted to the wound cleanser now. She has been using if anything just over the counter triple antibiotic ointment and a Band-Aid. With that being said she's not even been using that more recently. The most part she's just been attempting allow this to dry out/scab over. Her APIs were greater than 220 and noncompressible. Patient does have a history of paroxysmal atrial fibrillation for  which she is on Coumadin. She also has chronic kidney disease stage III. She has been on antibiotics that I see no evidence of infection right now which is good news. 01/24/18 on evaluation today patient appears to be doing excellent in regard to her lower extremity ulcers. She has been tolerating the dressing changes without complication which is excellent news. Overall I have been very pleased with her progress. Nonetheless she still has have some ways to go before this will be completely healed. The left is a little bit better in appearance compared to the right which is somewhat deeper. Electronic Signature(s) Signed: 01/30/2018 9:23:56 AM By: Worthy Keeler PA-C Entered By: Worthy Keeler on 01/30/2018 09:19:01 Philipp, Oluwadarasimi CMarland Kitchen ([id number]) -------------------------------------------------------------------------------- Physical Exam Details Patient Name: Hakeem, Lilinoe C. Date of Service: 01/24/2018 1:00 PM Medical Record Number: [id number] Patient Account Number: [id number] Date of Birth/Sex: May 02, 1931 (86 y.o. F) Treating RN: Harold Barban Primary Care Provider: Harrel Lemon Other Clinician: Referring Provider: Harrel Lemon Treating Provider/Extender: Melburn Hake, Ashley Bultema Weeks in Treatment: 1 Constitutional Well-nourished and well-hydrated in no acute distress. Respiratory normal breathing without difficulty. Psychiatric this patient is able to make decisions and demonstrates good insight into disease process. Alert and Oriented x 3. pleasant and cooperative. Notes Both the patient's wounds require sharp debridement today. Post debridement she seems to be doing much better which is excellent news. Electronic Signature(s) Signed: 01/30/2018 9:23:56 AM By: Worthy Keeler PA-C Entered By: Worthy Keeler on 01/30/2018 09:19:20 Grochowski, Lezley Loletha Grayer ([id number]) -------------------------------------------------------------------------------- Physician Orders Details Patient Name: Antone,  Eyva C. Date of Service: 01/24/2018 1:00 PM Medical Record Number: [id number] Patient Account Number: [id number] Date of Birth/Sex: [birth year] (86 y.o. F) Treating RN: Harold Barban Primary Care Provider: Harrel Lemon Other Clinician: Referring Provider: Harrel Lemon Treating Provider/Extender: Melburn Hake, Esli Clements Weeks in Treatment: 1 Verbal / Phone Orders: No Diagnosis Coding ICD-10 Coding Code Description [phone] Non-pressure chronic ulcer of other part of right lower leg with fat layer exposed L97.822 Non-pressure chronic ulcer of other part of left lower leg with fat layer exposed S81.802A Unspecified open wound, left lower leg, initial encounter S81.801A Unspecified open wound, right lower leg, initial encounter I48.0 Paroxysmal atrial fibrillation Z79.01 Long term (current) use of anticoagulants N18.3 Chronic kidney disease, stage 3 (moderate) Wound Cleansing Wound #1  Right,Anterior Lower Leg o Clean wound with Normal Saline. o May Shower, gently pat wound dry prior to applying new dressing. Wound #2 Left,Anterior Lower Leg o Clean wound with Normal Saline. o May Shower, gently pat wound dry prior to applying new dressing. Primary Wound Dressing Wound #1 Right,Anterior Lower Leg o Silver Collagen Wound #2 Left,Anterior Lower Leg o Silver Collagen Secondary Dressing Wound #1 Right,Anterior Lower Leg o Tegaderm o Non-adherent pad Wound #2 Left,Anterior Lower Leg o Tegaderm o Non-adherent pad Dressing Change Frequency Wound #1 Right,Anterior Lower Leg o Change Dressing Monday, Wednesday, Friday Wound #2 Left,Anterior Lower Leg o Change Dressing Monday, Wednesday, Friday [place]ilenburg, Ronnika C. ([id number]) Follow-up Appointments Wound #1 Right,Anterior Lower Leg o Return Appointment in 1 week. Wound #2 Left,Anterior Lower Leg o Return Appointment in 1 week. Edema Control Wound #1 Right,Anterior Lower Leg o Elevate legs to the level of the  heart and pump ankles as often as possible Wound #2 Left,Anterior Lower Leg o Elevate legs to the level of the heart and pump ankles as often as possible Electronic Signature(s) Signed: 01/27/2018 5:13:49 PM By: Harold Barban Signed: 01/30/2018 9:23:56 AM By: Worthy Keeler PA-C Entered By: Harold Barban on 01/24/2018 13:37:35 Wiedemann, Ksenia C. ([id number]) -------------------------------------------------------------------------------- Problem List Details Patient Name: Stidd, Kaydynce C. Date of Service: 01/24/2018 1:00 PM Medical Record Number: [id number] Patient Account Number: [id number] Date of Birth/Sex: 1931/06/04 (86 y.o. F) Treating RN: Harold Barban Primary Care Provider: Harrel Lemon Other Clinician: Referring Provider: Harrel Lemon Treating Provider/Extender: Melburn Hake, Zeph Riebel Weeks in Treatment: 1 Active Problems ICD-10 Evaluated Encounter Code Description Active Date Today Diagnosis L97.812 Non-pressure chronic ulcer of other part of right lower leg 01/17/2018 No Yes with fat layer exposed L97.822 Non-pressure chronic ulcer of other part of left lower leg with 01/17/2018 No Yes fat layer exposed S81.802A Unspecified open wound, left lower leg, initial encounter 01/17/2018 No Yes S81.801A Unspecified open wound, right lower leg, initial encounter 01/17/2018 No Yes I48.0 Paroxysmal atrial fibrillation 01/17/2018 No Yes Z79.01 Long term (current) use of anticoagulants 01/17/2018 No Yes N18.3 Chronic kidney disease, stage 3 (moderate) 01/17/2018 No Yes Inactive Problems Resolved Problems Electronic Signature(s) Signed: 01/30/2018 9:23:56 AM By: Worthy Keeler PA-C Entered By: Worthy Keeler on 01/24/2018 13:07:57 Tewksbury, Verley C. ([id number]) -------------------------------------------------------------------------------- Progress Note Details Patient Name: Pett, Leann C. Date of Service: 01/24/2018 1:00 PM Medical Record Number: [id number] Patient Account Number:  [id number] Date of Birth/Sex: 06/16/1931 (86 y.o. F) Treating RN: Harold Barban Primary Care Provider: Harrel Lemon Other Clinician: Referring Provider: Harrel Lemon Treating Provider/Extender: Melburn Hake, Wynell Halberg Weeks in Treatment: 1 Subjective Chief Complaint Information obtained from Patient Bilateral LE Ulcers History of Present Illness (HPI) 01/17/18 on evaluation today patient presents for evaluation of two ulcers both on the anterior portion of her lower extremities one right and one left. The right she has had for about three weeks the left for about eight weeks and both occurred as a result of her traumatizing her shins on the bottom of the card were trying to get out of the car. She states this is never happen with any other car doors before but she has been having some issues with this sharp area on the store in particular. Both occurred in the similar situation where she was trying not to open the door the whole way in order to avoid damaging anybody else's car beside them. She is been using peroxide initially to clean the wound although she has converted to the wound cleanser  now. She has been using if anything just over the counter triple antibiotic ointment and a Band-Aid. With that being said she's not even been using that more recently. The most part she's just been attempting allow this to dry out/scab over. Her APIs were greater than 220 and noncompressible. Patient does have a history of paroxysmal atrial fibrillation for which she is on Coumadin. She also has chronic kidney disease stage III. She has been on antibiotics that I see no evidence of infection right now which is good news. 01/24/18 on evaluation today patient appears to be doing excellent in regard to her lower extremity ulcers. She has been tolerating the dressing changes without complication which is excellent news. Overall I have been very pleased with her progress. Nonetheless she still has have some ways  to go before this will be completely healed. The left is a little bit better in appearance compared to the right which is somewhat deeper. Patient History Information obtained from Patient. Family History Cancer - Mother, Diabetes - Siblings, Heart Disease - Father,Siblings, Hypertension - Siblings, Kidney Disease - Siblings, No family history of Hereditary Spherocytosis, Lung Disease, Seizures. Social History Never smoker, Marital Status - Married, Alcohol Use - Never, Drug Use - No History, Caffeine Use - Rarely. Review of Systems (ROS) Constitutional Symptoms (General Health) Denies complaints or symptoms of Fever, Chills. Respiratory The patient has no complaints or symptoms. Cardiovascular The patient has no complaints or symptoms. Psychiatric The patient has no complaints or symptoms. Enterline, Shamiracle C. ([id number]) Objective Constitutional Well-nourished and well-hydrated in no acute distress. Vitals Time Taken: 12:58 PM, Height: 61 in, Weight: 100 lbs, BMI: 18.9, Temperature: 97.5 F, Pulse: 69 bpm, Respiratory Rate: 16 breaths/min, Blood Pressure: 137/60 mmHg. Respiratory normal breathing without difficulty. Psychiatric this patient is able to make decisions and demonstrates good insight into disease process. Alert and Oriented x 3. pleasant and cooperative. General Notes: Both the patient's wounds require sharp debridement today. Post debridement she seems to be doing much better which is excellent news. Integumentary (Hair, Skin) Wound #1 status is Open. Original cause of wound was Trauma. The wound is located on the Right,Anterior Lower Leg. The wound measures 1.6cm length x 1cm width x 0.1cm depth; 1.257cm^2 area and 0.126cm^3 volume. There is Fat Layer (Subcutaneous Tissue) Exposed exposed. There is no tunneling noted, however, there is undermining starting at 12:00 and ending at 12:00 with a maximum distance of 0.5cm. There is a none present amount of drainage noted. The  wound margin is flat and intact. There is no granulation within the wound bed. There is a large (67-100%) amount of necrotic tissue within the wound bed including Adherent Slough. The periwound skin appearance exhibited: Hemosiderin Staining. The periwound skin appearance did not exhibit: Callus, Crepitus, Excoriation, Induration, Rash, Scarring, Dry/Scaly, Maceration, Atrophie Blanche, Cyanosis, Ecchymosis, Mottled, Pallor, Rubor, Erythema. The periwound has tenderness on palpation. Wound #2 status is Open. Original cause of wound was Trauma. The wound is located on the Left,Anterior Lower Leg. The wound measures 1.5cm length x 1.4cm width x 0.3cm depth; 1.649cm^2 area and 0.495cm^3 volume. There is Fat Layer (Subcutaneous Tissue) Exposed exposed. There is no tunneling or undermining noted. There is a medium amount of serosanguineous drainage noted. The wound margin is flat and intact. There is no granulation within the wound bed. There is a large (67-100%) amount of necrotic tissue within the wound bed including Adherent Slough. The periwound skin appearance exhibited: Hemosiderin Staining. The periwound skin appearance did not exhibit: Callus,  Crepitus, Excoriation, Induration, Rash, Scarring, Dry/Scaly, Maceration, Atrophie Blanche, Cyanosis, Ecchymosis, Mottled, Pallor, Rubor, Erythema. The periwound has tenderness on palpation. Assessment Active Problems ICD-10 Non-pressure chronic ulcer of other part of right lower leg with fat layer exposed Non-pressure chronic ulcer of other part of left lower leg with fat layer exposed Unspecified open wound, left lower leg, initial encounter Unspecified open wound, right lower leg, initial encounter Paroxysmal atrial fibrillation Long term (current) use of anticoagulants Chronic kidney disease, stage 3 (moderate) Hoppes, Mialee C. ([id number]) Procedures Wound #1 Pre-procedure diagnosis of Wound #1 is a Trauma, Other located on the Right,Anterior  Lower Leg . There was a Excisional Skin/Subcutaneous Tissue Debridement with a total area of 1.6 sq cm performed by STONE III, Rondell [name] E., PA-C. With the following instrument(s): Curette to remove Viable and Non-Viable tissue/material. Material removed includes Subcutaneous Tissue and Slough and after achieving pain control using Lidocaine. No specimens were taken. A time out was conducted at 13:32, prior to the start of the procedure. A Minimum amount of bleeding was controlled with Pressure. The procedure was tolerated well with a pain level of 0 throughout and a pain level of 0 following the procedure. Post Debridement Measurements: 1.6cm length x 1cm width x 0.1cm depth; 0.126cm^3 volume. Character of Wound/Ulcer Post Debridement is improved. Post procedure Diagnosis Wound #1: Same as Pre-Procedure Wound #2 Pre-procedure diagnosis of Wound #2 is a Trauma, Other located on the Left,Anterior Lower Leg . There was a Excisional Skin/Subcutaneous Tissue Debridement with a total area of 2.1 sq cm performed by STONE III, Cyle Kenyon E., PA-C. With the following instrument(s): Curette to remove Viable and Non-Viable tissue/material. Material removed includes Subcutaneous Tissue and Slough and after achieving pain control using Lidocaine. No specimens were taken. A time out was conducted at 13:32, prior to the start of the procedure. A Minimum amount of bleeding was controlled with Pressure. The procedure was tolerated well with a pain level of 0 throughout and a pain level of 0 following the procedure. Post Debridement Measurements: 1.5cm length x 1.4cm width x 0.3cm depth; 0.495cm^3 volume. Character of Wound/Ulcer Post Debridement is improved. Post procedure Diagnosis Wound #2: Same as Pre-Procedure Plan Wound Cleansing: Wound #1 Right,Anterior Lower Leg: Clean wound with Normal Saline. May Shower, gently pat wound dry prior to applying new dressing. Wound #2 Left,Anterior Lower Leg: Clean wound with  Normal Saline. May Shower, gently pat wound dry prior to applying new dressing. Primary Wound Dressing: Wound #1 Right,Anterior Lower Leg: Silver Collagen Wound #2 Left,Anterior Lower Leg: Silver Collagen Secondary Dressing: Wound #1 Right,Anterior Lower Leg: Tegaderm Non-adherent pad Wound #2 Left,Anterior Lower Leg: Tegaderm Non-adherent pad Dressing Change Frequency: Marsalis, Hollie C. ([id number]) Wound #1 Right,Anterior Lower Leg: Change Dressing Monday, Wednesday, Friday Wound #2 Left,Anterior Lower Leg: Change Dressing Monday, Wednesday, Friday Follow-up Appointments: Wound #1 Right,Anterior Lower Leg: Return Appointment in 1 week. Wound #2 Left,Anterior Lower Leg: Return Appointment in 1 week. Edema Control: Wound #1 Right,Anterior Lower Leg: Elevate legs to the level of the heart and pump ankles as often as possible Wound #2 Left,Anterior Lower Leg: Elevate legs to the level of the heart and pump ankles as often as possible I'm gonna suggest that we continue with the above wound care measures she's in agreement with that plan. If anything changes patient will let me know. Please see above for specific wound care orders. We will see patient for re-evaluation in 1 week(s) here in the clinic. If anything worsens or changes patient will contact our  office for additional recommendations. Electronic Signature(s) Signed: 02/03/2018 12:31:00 PM By: Worthy Keeler PA-C Previous Signature: 01/30/2018 9:23:56 AM Version By: Worthy Keeler PA-C Entered By: Worthy Keeler on 02/03/2018 12:27:30 Veazey, Emerald C. ([id number]) -------------------------------------------------------------------------------- ROS/PFSH Details Patient Name: Shafran, Ashlyn C. Date of Service: 01/24/2018 1:00 PM Medical Record Number: [id number] Patient Account Number: [id number] Date of Birth/Sex: [birth year] (86 y.o. F) Treating RN: Harold Barban Primary Care Provider: Harrel Lemon Other  Clinician: Referring Provider: Harrel Lemon Treating Provider/Extender: Melburn Hake, Rogen Porte Weeks in Treatment: 1 Information Obtained From Patient Wound History Do you currently have one or more open woundso Yes How many open wounds do you currently haveo 2 Approximately how long have you had your woundso 8 weeks How have you been treating your wound(s) until nowo peroxide, neosporin and wound cleanser and bandage. Has your wound(s) ever healed and then re-openedo No Have you had any lab work done in the past montho No Have you tested positive for an antibiotic resistant organism No (MRSA, VRE)o Have you tested positive for osteomyelitis (bone infection)o No Have you had any tests for circulation on your legso No Constitutional Symptoms (General Health) Complaints and Symptoms: Negative for: Fever; Chills Eyes Medical History: Negative for: Cataracts; Glaucoma; Optic Neuritis Ear/Nose/Mouth/Throat Medical History: Negative for: Chronic sinus problems/congestion; Middle ear problems Hematologic/Lymphatic Medical History: Negative for: Anemia; Hemophilia; Human Immunodeficiency Virus; Lymphedema; Sickle Cell Disease Respiratory Complaints and Symptoms: No Complaints or Symptoms Medical History: Negative for: Aspiration; Asthma; Chronic Obstructive Pulmonary Disease (COPD); Pneumothorax; Sleep Apnea; Tuberculosis Cardiovascular Complaints and Symptoms: No Complaints or Symptoms Neuhaus, Maekayla C. ([id number]) Medical History: Positive for: Congestive Heart Failure; Coronary Artery Disease Negative for: Angina; Arrhythmia; Deep Vein Thrombosis; Hypertension; Hypotension; Myocardial Infarction; Peripheral Arterial Disease; Peripheral Venous Disease; Phlebitis; Vasculitis Gastrointestinal Medical History: Negative for: Cirrhosis ; Colitis; Crohnos; Hepatitis A; Hepatitis B; Hepatitis C Endocrine Medical History: Negative for: Type I Diabetes; Type II  Diabetes Genitourinary Medical History: Positive for: End Stage Renal Disease Immunological Medical History: Negative for: Lupus Erythematosus; Raynaudos; Scleroderma Integumentary (Skin) Medical History: Negative for: History of Burn; History of pressure wounds Musculoskeletal Medical History: Negative for: Gout; Rheumatoid Arthritis; Osteoarthritis; Osteomyelitis Neurologic Medical History: Negative for: Dementia; Neuropathy; Quadriplegia; Paraplegia Oncologic Medical History: Negative for: Received Chemotherapy; Received Radiation Psychiatric Complaints and Symptoms: No Complaints or Symptoms Medical History: Negative for: Anorexia/bulimia; Confinement Anxiety Immunizations Pneumococcal Vaccine: Received Pneumococcal Vaccination: Yes Implantable Devices Coombs, Harrietta C. ([id number]) Family and Social History Cancer: Yes - Mother; Diabetes: Yes - Siblings; Heart Disease: Yes - Father,Siblings; Hereditary Spherocytosis: No; Hypertension: Yes - Siblings; Kidney Disease: Yes - Siblings; Lung Disease: No; Seizures: No; Never smoker; Marital Status - Married; Alcohol Use: Never; Drug Use: No History; Caffeine Use: Rarely; Financial Concerns: No; Food, Clothing or Shelter Needs: No; Support System Lacking: No; Transportation Concerns: No; Advanced Directives: Yes (Not Provided); Patient does not want information on Advanced Directives; Do not resuscitate: Yes (Not Provided); Living Will: Yes (Not Provided); Medical Power of Attorney: Yes (Not Provided) Physician Affirmation I have reviewed and agree with the above information. Electronic Signature(s) Signed: 01/30/2018 9:23:56 AM By: Worthy Keeler PA-C Signed: 03/17/2018 10:53:34 AM By: Harold Barban Entered By: Worthy Keeler on 01/30/2018 09:19:12 Stockman, Celie C. ([id number]) -------------------------------------------------------------------------------- SuperBill Details Patient Name: Mackel, Hetvi C. Date of Service:  01/24/2018 Medical Record Number: [id number] Patient Account Number: [id number] Date of Birth/Sex: 1931-11-30 (85 y.o. F) Treating RN: Harold Barban Primary Care Provider: Harrel Lemon Other Clinician: Referring Provider: Harrel Lemon  Treating Provider/Extender: Melburn Hake, Kirsi Hugh Weeks in Treatment: 1 Diagnosis Coding ICD-10 Codes Code Description [phone] Non-pressure chronic ulcer of other part of right lower leg with fat layer exposed L97.822 Non-pressure chronic ulcer of other part of left lower leg with fat layer exposed S81.802A Unspecified open wound, left lower leg, initial encounter S81.801A Unspecified open wound, right lower leg, initial encounter I48.0 Paroxysmal atrial fibrillation Z79.01 Long term (current) use of anticoagulants N18.3 Chronic kidney disease, stage 3 (moderate) Facility Procedures CPT4 Code Description: [id number] 11042 - DEB SUBQ TISSUE 20 SQ CM/< ICD-10 Diagnosis Description T09.311 Non-pressure chronic ulcer of other part of right lower leg wi L97.822 Non-pressure chronic ulcer of other part of left lower leg wit Modifier: th fat layer expo h fat layer expos Quantity: 1 sed ed Physician Procedures CPT4 Code Description: [id number] 11042 - WC PHYS SUBQ TISS 20 SQ CM ICD-10 Diagnosis Description X50.722 Non-pressure chronic ulcer of other part of right lower leg wi L97.822 Non-pressure chronic ulcer of other part of left lower leg wit Modifier: th fat layer expo h fat layer expos Quantity: 1 sed ed Electronic Signature(s) Signed: 02/03/2018 12:31:00 PM By: Worthy Keeler PA-C Previous Signature: 01/27/2018 9:58:15 PM Version By: Worthy Keeler PA-C Entered By: Worthy Keeler on 02/03/2018 12:27:52

## 2018-03-17 NOTE — Progress Notes (Signed)
Elizabeth, Fields ([id number]) Visit Report for 03/14/2018 Chief Complaint Document Details Patient Name: Elizabeth Fields, [name] Date of Service: 03/14/2018 1:45 PM Medical Record Number: [id number] Patient Account Number: [id number] Date of Birth/Sex: 1931/12/19 (86 y.o. F) Treating RN: Montey Hora Primary Care Provider: Harrel Lemon Other Clinician: Referring Provider: Harrel Lemon Treating Provider/Extender: Melburn Hake, HOYT Weeks in Treatment: 8 Information Obtained from: Patient Chief Complaint Bilateral LE Ulcers Electronic Signature(s) Signed: 03/15/2018 6:13:30 AM By: Worthy Keeler PA-C Entered By: Worthy Keeler on 03/14/2018 13:38:59 Logue, Teea C. ([id number]) -------------------------------------------------------------------------------- Debridement Details Patient Name: Fields, [name] Date of Service: 03/14/2018 1:45 PM Medical Record Number: [id number] Patient Account Number: [id number] Date of Birth/Sex: [birth year] (86 y.o. F) Treating RN: Montey Hora Primary Care Provider: Harrel Lemon Other Clinician: Referring Provider: Harrel Lemon Treating Provider/Extender: Melburn Hake, HOYT Weeks in Treatment: 8 Debridement Performed for Wound #1 Right,Anterior Lower Leg Assessment: Performed By: Clinician Montey Hora, RN Debridement Type: Chemical/Enzymatic/Mechanical Agent Used: Santyl Level of Consciousness (Pre- Awake and Alert procedure): Pre-procedure Verification/Time Yes - 14:35 Out Taken: Start Time: 14:35 Pain Control: Lidocaine 4% Topical Solution Instrument: Other : tongue blade Bleeding: None End Time: 14:36 Procedural Pain: 0 Post Procedural Pain: 0 Response to Treatment: Procedure was tolerated well Level of Consciousness Awake and Alert (Post-procedure): Post Debridement Measurements of Total Wound Length: (cm) 1 Width: (cm) 0.6 Depth: (cm) 0.1 Volume: (cm) 0.047 Character of Wound/Ulcer Post Debridement: Improved Post Procedure  Diagnosis Same as Pre-procedure Electronic Signature(s) Signed: 03/14/2018 4:27:08 PM By: Montey Hora Signed: 03/15/2018 6:13:30 AM By: Worthy Keeler PA-C Entered By: Montey Hora on 03/14/2018 14:32:14 Kruck, Rochel C. ([id number]) -------------------------------------------------------------------------------- HPI Details Patient Name: Fields, [name] Date of Service: 03/14/2018 1:45 PM Medical Record Number: [id number] Patient Account Number: [id number] Date of Birth/Sex: 1931-10-27 (86 y.o. F) Treating RN: Montey Hora Primary Care Provider: Harrel Lemon Other Clinician: Referring Provider: Harrel Lemon Treating Provider/Extender: Melburn Hake, HOYT Weeks in Treatment: 8 History of Present Illness HPI Description: 01/17/18 on evaluation today patient presents for evaluation of two ulcers both on the anterior portion of her lower extremities one right and one left. The right she has had for about three weeks the left for about eight weeks and both occurred as a result of her traumatizing her shins on the bottom of the card were trying to get out of the car. She states this is never happen with any other car doors before but she has been having some issues with this sharp area on the store in particular. Both occurred in the similar situation where she was trying not to open the door the whole way in order to avoid damaging anybody else's car beside them. She is been using peroxide initially to clean the wound although she has converted to the wound cleanser now. She has been using if anything just over the counter triple antibiotic ointment and a Band-Aid. With that being said she's not even been using that more recently. The most part she's just been attempting allow this to dry out/scab over. Her APIs were greater than 220 and noncompressible. Patient does have a history of paroxysmal atrial fibrillation for which she is on Coumadin. She also has chronic kidney disease stage III. She  has been on antibiotics that I see no evidence of infection right now which is good news. 01/24/18 on evaluation today patient appears to be doing excellent in regard to her lower extremity ulcers. She has been tolerating the dressing changes without complication which  is excellent news. Overall I have been very pleased with her progress. Nonetheless she still has have some ways to go before this will be completely healed. The left is a little bit better in appearance compared to the right which is somewhat deeper. 01/31/18 on evaluation today patient appears to be doing better in regard to her bilateral anterior lower extremity ulcers. She has been tolerating the dressing changes without complication which is excellent news. Fortunately there does not appear to be any evidence of infection at this time which is also good news. I'm very pleased with how things seem to be progressing at this point. The patient likewise is very happy and states she's not having the pain that she was having previous. 02/07/18 on evaluation today patient appears to be doing rather well in regard to her ulcers. The left especially seems to be making good progress in the right does have more slough and poor granulation tissue on the surface at this point. I think this is gonna require little bit more extensive debridement. Fortunately there's no signs of infection at this time. 02/14/18 on evaluation today patient appears to be doing better in the overall appearance of the wounds although she tells me she's been having more burning over the past week. There appears to be some adhesive irritation. That has me somewhat concerned as far as the reason for the burning is concerned. With that being said I do not see any signs of actual infection which is good news. No fevers, chills, nausea, or vomiting noted at this time. Overall there does not appear to be any sign that there is worsening to me and the only reason the right  extremity wound is bigger is that I actually performed a fairly significant debridement last week. That maybe some of the reason why she's had a little bit more pain as well. 02/21/18 on evaluation today patient appears to be doing poorly in regard to her lower extremity ulcers unfortunately. She continues to have discomfort despite the dressing switch I don't think the dressing is the issue I believe she may actually have an infection and that could be the problem that we're facing at this point. Fortunately there's no signs of systemic infection. No fevers, chills, nausea, or vomiting noted at this time. Unfortunately I'm just not seeing the progress that I would expect especially after cleaning the wounds off sufficiently. 02/28/18 on evaluation today patient appears to be doing a little better in regard to her bilateral anterior lower Trinity ulcer. She still has some inflammation surrounding the wound but not nearly as bad as last week. Her culture did come back showing positive for Pseudomonas as the organism likely responsible for the infection. For that reason I did stop the doxycycline sent in a prescription for Levaquin for her today. Other than that and pleased with how things seem to be progressing I do believe the alginate have been better for her. 03/07/18 on evaluation today patient actually appears to be doing very well in regard to her bilateral lower Trinity ulcer's. In fact she seems to be showing signs of excellent improvement I do believe that the Levaquin we prescribed has been of benefit for Dethlefs, Yoland C. ([id number]) her the infection appears to be clearing quite nicely. I'm very pleased in that regard. Her pain is also much less severe compared to what it was previous. 03/14/18 on evaluation today patient actually appears to be doing much better in regard to her bilateral lower is from the  ulcers. In fact she is appearing to almost be if not completely healed in regard to left  lower extremity. Parts of the right lower extremity this is significantly improved although not completely healed. Fortunately there's no signs of active infection at this time I believe we may finally have got not under control which is excellent. Electronic Signature(s) Signed: 03/15/2018 6:13:30 AM By: Worthy Keeler PA-C Entered By: Worthy Keeler on 03/15/2018 05:43:39 Lupinski, Shannell Loletha Grayer ([id number]) -------------------------------------------------------------------------------- Physical Exam Details Patient Name: Nave, Yula C. Date of Service: 03/14/2018 1:45 PM Medical Record Number: [id number] Patient Account Number: [id number] Date of Birth/Sex: Dec 03, 1931 (86 y.o. F) Treating RN: Montey Hora Primary Care Provider: Harrel Lemon Other Clinician: Referring Provider: Harrel Lemon Treating Provider/Extender: Melburn Hake, HOYT Weeks in Treatment: 8 Constitutional Well-nourished and well-hydrated in no acute distress. Respiratory normal breathing without difficulty. clear to auscultation bilaterally. Cardiovascular regular rate and rhythm with normal S1, S2. Psychiatric this patient is able to make decisions and demonstrates good insight into disease process. Alert and Oriented x 3. pleasant and cooperative. Notes Patient's wound bed currently shows evidence of good granulation at this time. Fortunately there's no signs of infection at this point which is good news. She's been tolerating the dressing changes she attempted to use the central this was going to be $90 associated getting the Medihoney which was 30. Nonetheless this seems to be doing rather well for her at this point. Electronic Signature(s) Signed: 03/15/2018 6:13:30 AM By: Worthy Keeler PA-C Entered By: Worthy Keeler on 03/15/2018 05:44:15 Smigelski, Tanna Savoy ([id number]) -------------------------------------------------------------------------------- Physician Orders Details Patient Name: Conran, Arlissa C. Date of  Service: 03/14/2018 1:45 PM Medical Record Number: [id number] Patient Account Number: [id number] Date of Birth/Sex: 1931/03/02 (86 y.o. F) Treating RN: Montey Hora Primary Care Provider: Harrel Lemon Other Clinician: Referring Provider: Harrel Lemon Treating Provider/Extender: Melburn Hake, HOYT Weeks in Treatment: 8 Verbal / Phone Orders: No Diagnosis Coding ICD-10 Coding Code Description [phone] Non-pressure chronic ulcer of other part of right lower leg with fat layer exposed L97.822 Non-pressure chronic ulcer of other part of left lower leg with fat layer exposed S81.802A Unspecified open wound, left lower leg, initial encounter S81.801A Unspecified open wound, right lower leg, initial encounter I48.0 Paroxysmal atrial fibrillation Z79.01 Long term (current) use of anticoagulants N18.3 Chronic kidney disease, stage 3 (moderate) Wound Cleansing Wound #1 Right,Anterior Lower Leg o May Shower, gently pat wound dry prior to applying new dressing. - Clean with soap and water Primary Wound Dressing Wound #1 Right,Anterior Lower Leg o Santyl Ointment - If Santyl is too expensive, Use MediHoney (over the counter) Wound #2 Left,Anterior Lower Leg o Other: - Leave this wound open to air with no treatment Secondary Dressing Wound #1 Right,Anterior Lower Leg o Conform/Kerlix - secure with netting o Non-adherent pad Dressing Change Frequency Wound #1 Right,Anterior Lower Leg o Change dressing every day. Follow-up Appointments Wound #1 Right,Anterior Lower Leg o Return Appointment in 1 week. Wound #2 Left,Anterior Lower Leg o Return Appointment in 1 week. Edema Control Wound #1 Right,Anterior Lower Leg o Elevate legs to the level of the heart and pump ankles as often as possible Schweikert, Sherrey C. ([id number]) Electronic Signature(s) Signed: 03/14/2018 4:27:08 PM By: Montey Hora Signed: 03/15/2018 6:13:30 AM By: Worthy Keeler PA-C Entered By: Montey Hora on  03/14/2018 14:31:12 Cleavenger, Corona C. ([id number]) -------------------------------------------------------------------------------- Problem List Details Patient Name: Sweezy, Delsie C. Date of Service: 03/14/2018 1:45 PM Medical Record Number: [id number] Patient Account Number: [id number] Date  of Birth/Sex: [birth year] (86 y.o. F) Treating RN: Montey Hora Primary Care Provider: Harrel Lemon Other Clinician: Referring Provider: Harrel Lemon Treating Provider/Extender: Melburn Hake, HOYT Weeks in Treatment: 8 Active Problems ICD-10 Evaluated Encounter Code Description Active Date Today Diagnosis L97.812 Non-pressure chronic ulcer of other part of right lower leg 01/17/2018 No Yes with fat layer exposed L97.822 Non-pressure chronic ulcer of other part of left lower leg with 01/17/2018 No Yes fat layer exposed S81.802A Unspecified open wound, left lower leg, initial encounter 01/17/2018 No Yes S81.801A Unspecified open wound, right lower leg, initial encounter 01/17/2018 No Yes I48.0 Paroxysmal atrial fibrillation 01/17/2018 No Yes Z79.01 Long term (current) use of anticoagulants 01/17/2018 No Yes N18.3 Chronic kidney disease, stage 3 (moderate) 01/17/2018 No Yes Inactive Problems Resolved Problems Electronic Signature(s) Signed: 03/15/2018 6:13:30 AM By: Worthy Keeler PA-C Entered By: Worthy Keeler on 03/14/2018 13:38:55 Krenz, Gerene C. ([id number]) -------------------------------------------------------------------------------- Progress Note Details Patient Name: Rupe, Shakina C. Date of Service: 03/14/2018 1:45 PM Medical Record Number: [id number] Patient Account Number: [id number] Date of Birth/Sex: 21-Jun-1931 (86 y.o. F) Treating RN: Montey Hora Primary Care Provider: Harrel Lemon Other Clinician: Referring Provider: Harrel Lemon Treating Provider/Extender: Melburn Hake, HOYT Weeks in Treatment: 8 Subjective Chief Complaint Information obtained from Patient Bilateral LE Ulcers History  of Present Illness (HPI) 01/17/18 on evaluation today patient presents for evaluation of two ulcers both on the anterior portion of her lower extremities one right and one left. The right she has had for about three weeks the left for about eight weeks and both occurred as a result of her traumatizing her shins on the bottom of the card were trying to get out of the car. She states this is never happen with any other car doors before but she has been having some issues with this sharp area on the store in particular. Both occurred in the similar situation where she was trying not to open the door the whole way in order to avoid damaging anybody else's car beside them. She is been using peroxide initially to clean the wound although she has converted to the wound cleanser now. She has been using if anything just over the counter triple antibiotic ointment and a Band-Aid. With that being said she's not even been using that more recently. The most part she's just been attempting allow this to dry out/scab over. Her APIs were greater than 220 and noncompressible. Patient does have a history of paroxysmal atrial fibrillation for which she is on Coumadin. She also has chronic kidney disease stage III. She has been on antibiotics that I see no evidence of infection right now which is good news. 01/24/18 on evaluation today patient appears to be doing excellent in regard to her lower extremity ulcers. She has been tolerating the dressing changes without complication which is excellent news. Overall I have been very pleased with her progress. Nonetheless she still has have some ways to go before this will be completely healed. The left is a little bit better in appearance compared to the right which is somewhat deeper. 01/31/18 on evaluation today patient appears to be doing better in regard to her bilateral anterior lower extremity ulcers. She has been tolerating the dressing changes without complication which  is excellent news. Fortunately there does not appear to be any evidence of infection at this time which is also good news. I'm very pleased with how things seem to be progressing at this point. The patient likewise is very happy and states she's  not having the pain that she was having previous. 02/07/18 on evaluation today patient appears to be doing rather well in regard to her ulcers. The left especially seems to be making good progress in the right does have more slough and poor granulation tissue on the surface at this point. I think this is gonna require little bit more extensive debridement. Fortunately there's no signs of infection at this time. 02/14/18 on evaluation today patient appears to be doing better in the overall appearance of the wounds although she tells me she's been having more burning over the past week. There appears to be some adhesive irritation. That has me somewhat concerned as far as the reason for the burning is concerned. With that being said I do not see any signs of actual infection which is good news. No fevers, chills, nausea, or vomiting noted at this time. Overall there does not appear to be any sign that there is worsening to me and the only reason the right extremity wound is bigger is that I actually performed a fairly significant debridement last week. That maybe some of the reason why she's had a little bit more pain as well. 02/21/18 on evaluation today patient appears to be doing poorly in regard to her lower extremity ulcers unfortunately. She continues to have discomfort despite the dressing switch I don't think the dressing is the issue I believe she may actually have an infection and that could be the problem that we're facing at this point. Fortunately there's no signs of systemic infection. No fevers, chills, nausea, or vomiting noted at this time. Unfortunately I'm just not seeing the progress that I would expect especially after cleaning the wounds off  sufficiently. 02/28/18 on evaluation today patient appears to be doing a little better in regard to her bilateral anterior lower Trinity ulcer. She still has some inflammation surrounding the wound but not nearly as bad as last week. Her culture did come back showing Thull, Delight C. ([id number]) positive for Pseudomonas as the organism likely responsible for the infection. For that reason I did stop the doxycycline sent in a prescription for Levaquin for her today. Other than that and pleased with how things seem to be progressing I do believe the alginate have been better for her. 03/07/18 on evaluation today patient actually appears to be doing very well in regard to her bilateral lower Trinity ulcer's. In fact she seems to be showing signs of excellent improvement I do believe that the Levaquin we prescribed has been of benefit for her the infection appears to be clearing quite nicely. I'm very pleased in that regard. Her pain is also much less severe compared to what it was previous. 03/14/18 on evaluation today patient actually appears to be doing much better in regard to her bilateral lower is from the ulcers. In fact she is appearing to almost be if not completely healed in regard to left lower extremity. Parts of the right lower extremity this is significantly improved although not completely healed. Fortunately there's no signs of active infection at this time I believe we may finally have got not under control which is excellent. Patient History Information obtained from Patient. Family History Cancer - Mother, Diabetes - Siblings, Heart Disease - Father,Siblings, Hypertension - Siblings, Kidney Disease - Siblings, No family history of Hereditary Spherocytosis, Lung Disease, Seizures. Social History Never smoker, Marital Status - Married, Alcohol Use - Never, Drug Use - No History, Caffeine Use - Rarely. Medical History Eyes Denies history of  Cataracts, Glaucoma, Optic  Neuritis Ear/Nose/Mouth/Throat Denies history of Chronic sinus problems/congestion, Middle ear problems Hematologic/Lymphatic Denies history of Anemia, Hemophilia, Human Immunodeficiency Virus, Lymphedema, Sickle Cell Disease Respiratory Denies history of Aspiration, Asthma, Chronic Obstructive Pulmonary Disease (COPD), Pneumothorax, Sleep Apnea, Tuberculosis Cardiovascular Patient has history of Congestive Heart Failure, Coronary Artery Disease Denies history of Angina, Arrhythmia, Deep Vein Thrombosis, Hypertension, Hypotension, Myocardial Infarction, Peripheral Arterial Disease, Peripheral Venous Disease, Phlebitis, Vasculitis Gastrointestinal Denies history of Cirrhosis , Colitis, Crohn s, Hepatitis A, Hepatitis B, Hepatitis C Endocrine Denies history of Type I Diabetes, Type II Diabetes Genitourinary Patient has history of End Stage Renal Disease Immunological Denies history of Lupus Erythematosus, Raynaud s, Scleroderma Integumentary (Skin) Denies history of History of Burn, History of pressure wounds Musculoskeletal Denies history of Gout, Rheumatoid Arthritis, Osteoarthritis, Osteomyelitis Neurologic Denies history of Dementia, Neuropathy, Quadriplegia, Paraplegia Oncologic Denies history of Received Chemotherapy, Received Radiation Psychiatric Denies history of Anorexia/bulimia, Confinement Anxiety Review of Systems (ROS) Constitutional Symptoms (General Health) Hochstatter, Antara C. ([id number]) Denies complaints or symptoms of Fever, Chills. Respiratory The patient has no complaints or symptoms. Cardiovascular The patient has no complaints or symptoms. Psychiatric The patient has no complaints or symptoms. Objective Constitutional Well-nourished and well-hydrated in no acute distress. Vitals Time Taken: 1:55 PM, Height: 61 in, Weight: 100 lbs, BMI: 18.9, Temperature: 97.4 F, Pulse: 83 bpm, Respiratory Rate: 16 breaths/min, Blood Pressure: 122/58  mmHg. Respiratory normal breathing without difficulty. clear to auscultation bilaterally. Cardiovascular regular rate and rhythm with normal S1, S2. Psychiatric this patient is able to make decisions and demonstrates good insight into disease process. Alert and Oriented x 3. pleasant and cooperative. General Notes: Patient's wound bed currently shows evidence of good granulation at this time. Fortunately there's no signs of infection at this point which is good news. She's been tolerating the dressing changes she attempted to use the central this was going to be $90 associated getting the Medihoney which was 30. Nonetheless this seems to be doing rather well for her at this point. Integumentary (Hair, Skin) Wound #1 status is Open. Original cause of wound was Trauma. The wound is located on the Right,Anterior Lower Leg. The wound measures 1cm length x 0.6cm width x 0.1cm depth; 0.471cm^2 area and 0.047cm^3 volume. There is Fat Layer (Subcutaneous Tissue) Exposed exposed. There is no tunneling or undermining noted. There is a medium amount of serous drainage noted. The wound margin is flat and intact. There is no granulation within the wound bed. There is a large (67-100%) amount of necrotic tissue within the wound bed including Adherent Slough. The periwound skin appearance exhibited: Hemosiderin Staining. The periwound skin appearance did not exhibit: Callus, Crepitus, Excoriation, Induration, Rash, Scarring, Dry/Scaly, Maceration, Atrophie Blanche, Cyanosis, Ecchymosis, Mottled, Pallor, Rubor, Erythema. Periwound temperature was noted as No Abnormality. The periwound has tenderness on palpation. Wound #2 status is Open. Original cause of wound was Trauma. The wound is located on the Left,Anterior Lower Leg. The wound measures 0.1cm length x 0.1cm width x 0.1cm depth; 0.008cm^2 area and 0.001cm^3 volume. There is Fat Layer (Subcutaneous Tissue) Exposed exposed. There is no tunneling or  undermining noted. There is a medium amount of serous drainage noted. The wound margin is flat and intact. There is no granulation within the wound bed. There is a medium (34- 66%) amount of necrotic tissue within the wound bed including Eschar. The periwound skin appearance exhibited: Hemosiderin Staining. The periwound skin appearance did not exhibit: Callus, Crepitus, Excoriation, Induration, Rash, Scarring, Dry/Scaly, Maceration, Atrophie Blanche,  Cyanosis, Ecchymosis, Mottled, Pallor, Rubor, Erythema. Periwound Tabron, Dameisha C. ([id number]) temperature was noted as No Abnormality. The periwound has tenderness on palpation. 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 There was a Chemical/Enzymatic/Mechanical debridement performed by Montey Hora, RN. With the following instrument(s): tongue blade after achieving pain control using Lidocaine 4% Topical Solution. Agent used was [employer]. A time out was conducted at 14:35, prior to the start of the procedure. There was no bleeding. The procedure was tolerated well with a pain level of 0 throughout and a pain level of 0 following the procedure. Post Debridement Measurements: 1cm length x 0.6cm width x 0.1cm depth; 0.047cm^3 volume. Character of Wound/Ulcer Post Debridement is improved. Post procedure Diagnosis Wound #1: Same as Pre-Procedure Plan Wound Cleansing: Wound #1 Right,Anterior Lower Leg: May Shower, gently pat wound dry prior to  applying new dressing. - Clean with soap and water Primary Wound Dressing: Wound #1 Right,Anterior Lower Leg: Santyl Ointment - If Santyl is too expensive, Use MediHoney (over the counter) Wound #2 Left,Anterior Lower Leg: Other: - Leave this wound open to air with no treatment Secondary Dressing: Wound #1 Right,Anterior Lower Leg: Conform/Kerlix - secure with netting Non-adherent pad Dressing Change Frequency: Wound #1 Right,Anterior Lower Leg: Change dressing every day. Follow-up Appointments: ISAAC, LACSON ([id number]) Wound #1 Right,Anterior Lower Leg: Return Appointment in 1 week. Wound #2 Left,Anterior Lower Leg: Return Appointment in 1 week. Edema Control: Wound #1 Right,Anterior Lower Leg: Elevate legs to the level of the heart and pump ankles as often as possible The patient really did not want me to perform any sharp debridement this causes her significant discomfort unfortunately. For that reason I did avoid sharp debridement today per her request we will continue to let [employer] clean the word out for auto let it debridement. She's in agreement this plan. If anything changes or worsens the meantime she will contact the office let me know otherwise will see were things stand at follow-up. Please see above for specific wound care orders. We will see patient for re-evaluation in 1 week(s) here in the clinic. If anything worsens or changes patient will contact our office for additional recommendations. Electronic Signature(s) Signed: 03/15/2018 6:13:30 AM By: Worthy Keeler PA-C Entered By: Worthy Keeler on 03/15/2018 05:47:47 Cubero, Jiali Loletha Grayer ([id number]) -------------------------------------------------------------------------------- ROS/PFSH Details Patient Name: Balaban, Sarahbeth C. Date of Service: 03/14/2018 1:45 PM Medical Record Number: [id number] Patient Account Number: [id number] Date of Birth/Sex: October 20, 1931 (86 y.o. F) Treating RN: Montey Hora Primary Care Provider:  Harrel Lemon Other Clinician: Referring Provider: Harrel Lemon Treating Provider/Extender: Melburn Hake, HOYT Weeks in Treatment: 8 Information Obtained From Patient Wound History Do you currently have one or more open woundso Yes How many open wounds do you currently haveo 2 Approximately how long have you had your woundso 8 weeks How have you been treating your wound(s) until nowo peroxide, neosporin and wound cleanser and bandage. Has your wound(s) ever healed and then re-openedo No Have you had any lab work done in the past montho No Have you tested positive for an  antibiotic resistant organism No (MRSA, VRE)o Have you tested positive for osteomyelitis (bone infection)o No Have you had any tests for circulation on your legso No Constitutional Symptoms (General Health) Complaints and Symptoms: Negative for: Fever; Chills Eyes Medical History: Negative for: Cataracts; Glaucoma; Optic Neuritis Ear/Nose/Mouth/Throat Medical History: Negative for: Chronic sinus problems/congestion; Middle ear problems Hematologic/Lymphatic Medical History: Negative for: Anemia; Hemophilia; Human Immunodeficiency Virus; Lymphedema; Sickle Cell Disease Respiratory Complaints and Symptoms: No Complaints or Symptoms Medical History: Negative for: Aspiration; Asthma; Chronic Obstructive Pulmonary Disease (COPD); Pneumothorax; Sleep Apnea; Tuberculosis Cardiovascular Complaints and Symptoms: No Complaints or Symptoms Hirschmann, Caeley C. ([id number]) Medical History: Positive for: Congestive Heart Failure; Coronary Artery Disease Negative for: Angina; Arrhythmia; Deep Vein Thrombosis; Hypertension; Hypotension; Myocardial Infarction; Peripheral Arterial Disease; Peripheral Venous Disease; Phlebitis; Vasculitis Gastrointestinal Medical History: Negative for: Cirrhosis ; Colitis; Crohnos; Hepatitis A; Hepatitis B; Hepatitis C Endocrine Medical History: Negative for: Type I Diabetes; Type II  Diabetes Genitourinary Medical History: Positive for: End Stage Renal Disease Immunological Medical History: Negative for: Lupus Erythematosus; Raynaudos; Scleroderma Integumentary (Skin) Medical History: Negative for: History of Burn; History of pressure wounds Musculoskeletal Medical History: Negative for: Gout; Rheumatoid Arthritis; Osteoarthritis; Osteomyelitis Neurologic Medical History: Negative for: Dementia; Neuropathy; Quadriplegia; Paraplegia Oncologic Medical History: Negative for: Received Chemotherapy; Received Radiation Psychiatric Complaints and Symptoms: No Complaints or Symptoms Medical History: Negative for: Anorexia/bulimia; Confinement Anxiety Immunizations Pneumococcal Vaccine: Received Pneumococcal Vaccination: Yes Implantable Devices No devices added Bitting, Alany C. ([id number]) Family and Social History Cancer: Yes - Mother; Diabetes: Yes - Siblings; Heart Disease: Yes - Father,Siblings; Hereditary Spherocytosis: No; Hypertension: Yes - Siblings; Kidney Disease: Yes - Siblings; Lung Disease: No; Seizures: No; Never smoker; Marital Status - Married; Alcohol Use: Never; Drug Use: No History; Caffeine Use: Rarely; Financial Concerns: No; Food, Clothing or Shelter Needs: No; Support System Lacking: No; Transportation Concerns: No; Advanced Directives: Yes (Not Provided); Patient does not want information on Advanced Directives; Do not resuscitate: Yes (Not Provided); Living Will: Yes (Not Provided); Medical Power of Attorney: Yes (Not Provided) Physician Affirmation I have reviewed and agree with the above information. Electronic Signature(s) Signed: 03/15/2018 6:13:30 AM By: Worthy Keeler PA-C Signed: 03/17/2018 10:44:46 AM By: Montey Hora Entered By: Worthy Keeler on 03/15/2018 05:43:59 Carew, Soriyah C. ([id number]) -------------------------------------------------------------------------------- SuperBill Details Patient Name: Lundy, Jashanti C. Date of  Service: 03/14/2018 Medical Record Number: [id number] Patient Account Number: [id number] Date of Birth/Sex: 1931-02-20 (85 y.o. F) Treating RN: Montey Hora Primary Care Provider: Harrel Lemon Other Clinician: Referring Provider: Harrel Lemon Treating Provider/Extender: Melburn Hake, HOYT Weeks in Treatment: 8 Diagnosis Coding ICD-10 Codes Code Description [phone] Non-pressure chronic ulcer of other part of right lower leg with fat layer exposed L97.822 Non-pressure chronic ulcer of other part of left lower leg with fat layer exposed S81.802A Unspecified open wound, left lower leg, initial encounter S81.801A Unspecified open wound, right lower leg, initial encounter I48.0 Paroxysmal atrial fibrillation Z79.01 Long term (current) use of anticoagulants N18.3 Chronic kidney disease, stage 3 (moderate) Facility Procedures CPT4 Code: [id number] Description: 93235 - DEBRIDE W/O ANES NON SELECT Modifier: Quantity: 1 Physician Procedures CPT4 Code Description: [id number] - WC PHYS LEVEL 4 - EST PT ICD-10 Diagnosis Description L97.812 Non-pressure chronic ulcer of other part of right lower leg w W23.762 Non-pressure chronic ulcer of other part of left lower leg wi S81.802A Unspecified  open wound, left lower leg, initial encounter S81.801A Unspecified open wound, right lower leg, initial encounter Modifier: ith fat layer expo th fat layer expos  Quantity: 1 sed ed Electronic Signature(s) Signed: 03/15/2018 6:13:30 AM By: Worthy Keeler PA-C Entered By: Worthy Keeler on 03/15/2018 05:52:49

## 2018-03-19 DIAGNOSIS — M810 Age-related osteoporosis without current pathological fracture: Secondary | ICD-10-CM | POA: Diagnosis not present

## 2018-03-21 ENCOUNTER — Encounter: Payer: PPO | Admitting: Physician Assistant

## 2018-03-21 ENCOUNTER — Other Ambulatory Visit: Payer: Self-pay

## 2018-03-21 DIAGNOSIS — E11622 Type 2 diabetes mellitus with other skin ulcer: Secondary | ICD-10-CM | POA: Diagnosis not present

## 2018-03-21 DIAGNOSIS — L97812 Non-pressure chronic ulcer of other part of right lower leg with fat layer exposed: Secondary | ICD-10-CM | POA: Diagnosis not present

## 2018-03-21 DIAGNOSIS — L97822 Non-pressure chronic ulcer of other part of left lower leg with fat layer exposed: Secondary | ICD-10-CM | POA: Diagnosis not present

## 2018-03-23 NOTE — Progress Notes (Signed)
[name], STAPLES ([id number]) Visit Report for 03/21/2018 Chief Complaint Document Details Patient Name: Elizabeth Fields, [name] Date of Service: 03/21/2018 1:45 PM Medical Record Number: [id number] Patient Account Number: [id number] Date of Birth/Sex: 24-Nov-1931 (85 y.o. F) Treating RN: Montey Hora Primary Care Provider: Harrel Lemon Other Clinician: Referring Provider: Harrel Lemon Treating Provider/Extender: Melburn Hake, Aydn Ferrara Weeks in Treatment: 9 Information Obtained from: Patient Chief Complaint Bilateral LE Ulcers Electronic Signature(s) Signed: 03/21/2018 5:40:27 PM By: Worthy Keeler PA-C Entered By: Worthy Keeler on 03/21/2018 13:59:43 Houdeshell, [name] ([id number]) -------------------------------------------------------------------------------- Debridement Details Patient Name: Elizabeth Fields, [name] Date of Service: 03/21/2018 1:45 PM Medical Record Number: [id number] Patient Account Number: [id number] Date of Birth/Sex: 27-Aug-1931 (85 y.o. F) Treating RN: Montey Hora Primary Care Provider: Harrel Lemon Other Clinician: Referring Provider: Harrel Lemon Treating Provider/Extender: Melburn Hake, Rosebud [name] Weeks in Treatment: 9 Debridement Performed for Wound #1 Right,Anterior Lower Leg Assessment: Performed By: Physician STONE III, Leocadia Idleman E., PA-C Debridement Type: Chemical/Enzymatic/Mechanical Agent Used: Santyl Level of Consciousness (Pre- Awake and Alert procedure): Pre-procedure Verification/Time Yes - 14:15 Out Taken: Start Time: 14:15 Pain Control: Lidocaine 4% Topical Solution Instrument: Other : tongue depressor Bleeding: None End Time: 14:16 Procedural Pain: 0 Post Procedural Pain: 0 Response to Treatment: Procedure was tolerated well Level of Consciousness Awake and Alert (Post-procedure): Post Debridement Measurements of Total Wound Length: (cm) 1 Width: (cm) 0.6 Depth: (cm) 0.1 Volume: (cm) 0.047 Character of Wound/Ulcer Post Debridement: Improved Post  Procedure Diagnosis Same as Pre-procedure Electronic Signature(s) Signed: 03/21/2018 2:25:45 PM By: Montey Hora Signed: 03/21/2018 5:40:27 PM By: Worthy Keeler PA-C Entered By: Montey Hora on 03/21/2018 14:25:45 Etzkorn, [name] ([id number]) -------------------------------------------------------------------------------- HPI Details Patient Name: Elizabeth Fields, [name] Date of Service: 03/21/2018 1:45 PM Medical Record Number: [id number] Patient Account Number: [id number] Date of Birth/Sex: Jan 13, 1931 (85 y.o. F) Treating RN: Montey Hora Primary Care Provider: Harrel Lemon Other Clinician: Referring Provider: Harrel Lemon Treating Provider/Extender: Melburn Hake, Ashleynicole Mcclees Weeks in Treatment: 9 History of Present Illness HPI Description: 01/17/18 on evaluation today patient presents for evaluation of two ulcers both on the anterior portion of her lower extremities one right and one left. The right she has had for about three weeks the left for about eight weeks and both occurred as a result of her traumatizing her shins on the bottom of the card were trying to get out of the car. She states this is never happen with any other car doors before but she has been having some issues with this sharp area on the store in particular. Both occurred in the similar situation where she was trying not to open the door the whole way in order to avoid damaging anybody else's car beside them. She is been using peroxide initially to clean the wound although she has converted to the wound cleanser now. She has been using if anything just over the counter triple antibiotic ointment and a Band-Aid. With that being said she's not even been using that more recently. The most part she's just been attempting allow this to dry out/scab over. Her APIs were greater than 220 and noncompressible. Patient does have a history of paroxysmal atrial fibrillation for which she is on Coumadin. She also has chronic kidney disease  stage III. She has been on antibiotics that I see no evidence of infection right now which is good news. 01/24/18 on evaluation today patient appears to be doing excellent in regard to her lower extremity ulcers. She has been tolerating the dressing changes without  complication which is excellent news. Overall I have been very pleased with her progress. Nonetheless she still has have some ways to go before this will be completely healed. The left is a little bit better in appearance compared to the right which is somewhat deeper. 01/31/18 on evaluation today patient appears to be doing better in regard to her bilateral anterior lower extremity ulcers. She has been tolerating the dressing changes without complication which is excellent news. Fortunately there does not appear to be any evidence of infection at this time which is also good news. I'm very pleased with how things seem to be progressing at this point. The patient likewise is very happy and states she's not having the pain that she was having previous. 02/07/18 on evaluation today patient appears to be doing rather well in regard to her ulcers. The left especially seems to be making good progress in the right does have more slough and poor granulation tissue on the surface at this point. I think this is gonna require little bit more extensive debridement. Fortunately there's no signs of infection at this time. 02/14/18 on evaluation today patient appears to be doing better in the overall appearance of the wounds although she tells me she's been having more burning over the past week. There appears to be some adhesive irritation. That has me somewhat concerned as far as the reason for the burning is concerned. With that being said I do not see any signs of actual infection which is good news. No fevers, chills, nausea, or vomiting noted at this time. Overall there does not appear to be any sign that there is worsening to me and the only reason  the right extremity wound is bigger is that I actually performed a fairly significant debridement last week. That maybe some of the reason why she's had a little bit more pain as well. 02/21/18 on evaluation today patient appears to be doing poorly in regard to her lower extremity ulcers unfortunately. She continues to have discomfort despite the dressing switch I don't think the dressing is the issue I believe she may actually have an infection and that could be the problem that we're facing at this point. Fortunately there's no signs of systemic infection. No fevers, chills, nausea, or vomiting noted at this time. Unfortunately I'm just not seeing the progress that I would expect especially after cleaning the wounds off sufficiently. 02/28/18 on evaluation today patient appears to be doing a little better in regard to her bilateral anterior lower Trinity ulcer. She still has some inflammation surrounding the wound but not nearly as bad as last week. Her culture did come back showing positive for Pseudomonas as the organism likely responsible for the infection. For that reason I did stop the doxycycline sent in a prescription for Levaquin for her today. Other than that and pleased with how things seem to be progressing I do believe the alginate have been better for her. 03/07/18 on evaluation today patient actually appears to be doing very well in regard to her bilateral lower Trinity ulcer's. In fact she seems to be showing signs of excellent improvement I do believe that the Levaquin we prescribed has been of benefit for Justice, Chaela C. ([id number]) her the infection appears to be clearing quite nicely. I'm very pleased in that regard. Her pain is also much less severe compared to what it was previous. 03/14/18 on evaluation today patient actually appears to be doing much better in regard to her bilateral lower is  from the ulcers. In fact she is appearing to almost be if not completely healed in regard  to left lower extremity. Parts of the right lower extremity this is significantly improved although not completely healed. Fortunately there's no signs of active infection at this time I believe we may finally have got not under control which is excellent. 03/21/18 on evaluation today patient actually appears to be doing much better in regard to her bilateral lower extremity ulcer. The left ulcer still show signs of being covered with a dry eschar although it appears this is likely healed underneath I could not quite get this off easily and therefore I'm more apt to leave it in place until it starts to loosen up a little bit more than its own. In regard to the right it appears that this is doing much better there's more granulation as well as epithelialization noted and she's not having any pain like what she was previous. Electronic Signature(s) Signed: 03/21/2018 5:40:27 PM By: Worthy Keeler PA-C Entered By: Worthy Keeler on 03/21/2018 15:05:06 Elizabeth Fields, Elizabeth Fields Kitchen ([id number]) -------------------------------------------------------------------------------- Physical Exam Details Patient Name: Elizabeth Fields, [name] Date of Service: 03/21/2018 1:45 PM Medical Record Number: [id number] Patient Account Number: [id number] Date of Birth/Sex: [birth year] (85 y.o. F) Treating RN: Montey Hora Primary Care Provider: Harrel Lemon Other Clinician: Referring Provider: Harrel Lemon Treating Provider/Extender: Melburn Hake, Antonietta Lansdowne Weeks in Treatment: 9 Constitutional Well-nourished and well-hydrated in no acute distress. Respiratory normal breathing without difficulty. clear to auscultation bilaterally. Cardiovascular regular rate and rhythm with normal S1, S2. Psychiatric this patient is able to make decisions and demonstrates good insight into disease process. Alert and Oriented x 3. pleasant and cooperative. Notes Patient's wound bed currently shows signs of good granulation at this time on the right lower  extremity injuries shin. She also had good epithelialization especially at the proximal portion of the wound and the slough is clearly not quite nicely in regard to the distal portion wound overall I'm excited about the fact that this seems to be showing signs of improvement and no signs of infection. Per patient request no debridement was undertaken today. Electronic Signature(s) Signed: 03/21/2018 5:40:27 PM By: Worthy Keeler PA-C Entered By: Worthy Keeler on 03/21/2018 15:05:35 Elizabeth Fields, Elizabeth Fields ([id number]) -------------------------------------------------------------------------------- Physician Orders Details Patient Name: Elizabeth Fields, [name] Date of Service: 03/21/2018 1:45 PM Medical Record Number: [id number] Patient Account Number: [id number] Date of Birth/Sex: [birth year] (85 y.o. F) Treating RN: Montey Hora Primary Care Provider: Harrel Lemon Other Clinician: Referring Provider: Harrel Lemon Treating Provider/Extender: Melburn Hake, Divya Munshi Weeks in Treatment: 9 Verbal / Phone Orders: No Diagnosis Coding ICD-10 Coding Code Description [phone] Non-pressure chronic ulcer of other part of right lower leg with fat layer exposed L97.822 Non-pressure chronic ulcer of other part of left lower leg with fat layer exposed S81.802A Unspecified open wound, left lower leg, initial encounter S81.801A Unspecified open wound, right lower leg, initial encounter I48.0 Paroxysmal atrial fibrillation Z79.01 Long term (current) use of anticoagulants N18.3 Chronic kidney disease, stage 3 (moderate) Wound Cleansing Wound #1 Right,Anterior Lower Leg o May Shower, gently pat wound dry prior to applying new dressing. - Clean with soap and water Primary Wound Dressing Wound #1 Right,Anterior Lower Leg o Medihoney gel - Santyl in clinic Wound #2 Left,Anterior Lower Leg o Other: - Leave this wound open to air with no treatment Secondary Dressing Wound #1 Right,Anterior Lower Leg o Conform/Kerlix  - secure with netting o Non-adherent pad Dressing Change Frequency Wound #1 Right,Anterior  Lower Leg o Change dressing every day. Follow-up Appointments Wound #1 Right,Anterior Lower Leg o Return Appointment in 1 week. Wound #2 Left,Anterior Lower Leg o Return Appointment in 1 week. Edema Control Wound #1 Right,Anterior Lower Leg o Elevate legs to the level of the heart and pump ankles as often as possible Manygoats, Dezaray C. ([id number]) Electronic Signature(s) Signed: 03/21/2018 5:05:27 PM By: Montey Hora Signed: 03/21/2018 5:40:27 PM By: Worthy Keeler PA-C Entered By: Montey Hora on 03/21/2018 14:12:31 Elizabeth Fields, [name] ([id number]) -------------------------------------------------------------------------------- Problem List Details Patient Name: Elizabeth Fields, [name] Date of Service: 03/21/2018 1:45 PM Medical Record Number: [id number] Patient Account Number: [id number] Date of Birth/Sex: 01-14-1931 (85 y.o. F) Treating RN: Montey Hora Primary Care Provider: Harrel Lemon Other Clinician: Referring Provider: Harrel Lemon Treating Provider/Extender: Melburn Hake, Yekaterina Escutia Weeks in Treatment: 9 Active Problems ICD-10 Evaluated Encounter Code Description Active Date Today Diagnosis L97.812 Non-pressure chronic ulcer of other part of right lower leg 01/17/2018 No Yes with fat layer exposed L97.822 Non-pressure chronic ulcer of other part of left lower leg with 01/17/2018 No Yes fat layer exposed S81.802A Unspecified open wound, left lower leg, initial encounter 01/17/2018 No Yes S81.801A Unspecified open wound, right lower leg, initial encounter 01/17/2018 No Yes I48.0 Paroxysmal atrial fibrillation 01/17/2018 No Yes Z79.01 Long term (current) use of anticoagulants 01/17/2018 No Yes N18.3 Chronic kidney disease, stage 3 (moderate) 01/17/2018 No Yes Inactive Problems Resolved Problems Electronic Signature(s) Signed: 03/21/2018 5:40:27 PM By: Worthy Keeler PA-C Entered By: Worthy Keeler on 03/21/2018 13:59:35 Elizabeth Fields, [name] ([id number]) -------------------------------------------------------------------------------- Progress Note Details Patient Name: Elizabeth Fields, [name] Date of Service: 03/21/2018 1:45 PM Medical Record Number: [id number] Patient Account Number: [id number] Date of Birth/Sex: 07-Sep-1931 (85 y.o. F) Treating RN: Montey Hora Primary Care Provider: Harrel Lemon Other Clinician: Referring Provider: Harrel Lemon Treating Provider/Extender: Melburn Hake, Tremane Spurgeon Weeks in Treatment: 9 Subjective Chief Complaint Information obtained from Patient Bilateral LE Ulcers History of Present Illness (HPI) 01/17/18 on evaluation today patient presents for evaluation of two ulcers both on the anterior portion of her lower extremities one right and one left. The right she has had for about three weeks the left for about eight weeks and both occurred as a result of her traumatizing her shins on the bottom of the card were trying to get out of the car. She states this is never happen with any other car doors before but she has been having some issues with this sharp area on the store in particular. Both occurred in the similar situation where she was trying not to open the door the whole way in order to avoid damaging anybody else's car beside them. She is been using peroxide initially to clean the wound although she has converted to the wound cleanser now. She has been using if anything just over the counter triple antibiotic ointment and a Band-Aid. With that being said she's not even been using that more recently. The most part she's just been attempting allow this to dry out/scab over. Her APIs were greater than 220 and noncompressible. Patient does have a history of paroxysmal atrial fibrillation for which she is on Coumadin. She also has chronic kidney disease stage III. She has been on antibiotics that I see no evidence of infection right now which is good  news. 01/24/18 on evaluation today patient appears to be doing excellent in regard to her lower extremity ulcers. She has been tolerating the dressing changes without complication which is excellent news. Overall I have  been very pleased with her progress. Nonetheless she still has have some ways to go before this will be completely healed. The left is a little bit better in appearance compared to the right which is somewhat deeper. 01/31/18 on evaluation today patient appears to be doing better in regard to her bilateral anterior lower extremity ulcers. She has been tolerating the dressing changes without complication which is excellent news. Fortunately there does not appear to be any evidence of infection at this time which is also good news. I'm very pleased with how things seem to be progressing at this point. The patient likewise is very happy and states she's not having the pain that she was having previous. 02/07/18 on evaluation today patient appears to be doing rather well in regard to her ulcers. The left especially seems to be making good progress in the right does have more slough and poor granulation tissue on the surface at this point. I think this is gonna require little bit more extensive debridement. Fortunately there's no signs of infection at this time. 02/14/18 on evaluation today patient appears to be doing better in the overall appearance of the wounds although she tells me she's been having more burning over the past week. There appears to be some adhesive irritation. That has me somewhat concerned as far as the reason for the burning is concerned. With that being said I do not see any signs of actual infection which is good news. No fevers, chills, nausea, or vomiting noted at this time. Overall there does not appear to be any sign that there is worsening to me and the only reason the right extremity wound is bigger is that I actually performed a fairly significant debridement  last week. That maybe some of the reason why she's had a little bit more pain as well. 02/21/18 on evaluation today patient appears to be doing poorly in regard to her lower extremity ulcers unfortunately. She continues to have discomfort despite the dressing switch I don't think the dressing is the issue I believe she may actually have an infection and that could be the problem that we're facing at this point. Fortunately there's no signs of systemic infection. No fevers, chills, nausea, or vomiting noted at this time. Unfortunately I'm just not seeing the progress that I would expect especially after cleaning the wounds off sufficiently. 02/28/18 on evaluation today patient appears to be doing a little better in regard to her bilateral anterior lower Trinity ulcer. She still has some inflammation surrounding the wound but not nearly as bad as last week. Her culture did come back showing Burkard, Salley C. ([id number]) positive for Pseudomonas as the organism likely responsible for the infection. For that reason I did stop the doxycycline sent in a prescription for Levaquin for her today. Other than that and pleased with how things seem to be progressing I do believe the alginate have been better for her. 03/07/18 on evaluation today patient actually appears to be doing very well in regard to her bilateral lower Trinity ulcer's. In fact she seems to be showing signs of excellent improvement I do believe that the Levaquin we prescribed has been of benefit for her the infection appears to be clearing quite nicely. I'm very pleased in that regard. Her pain is also much less severe compared to what it was previous. 03/14/18 on evaluation today patient actually appears to be doing much better in regard to her bilateral lower is from the ulcers. In fact she is appearing  to almost be if not completely healed in regard to left lower extremity. Parts of the right lower extremity this is significantly improved  although not completely healed. Fortunately there's no signs of active infection at this time I believe we may finally have got not under control which is excellent. 03/21/18 on evaluation today patient actually appears to be doing much better in regard to her bilateral lower extremity ulcer. The left ulcer still show signs of being covered with a dry eschar although it appears this is likely healed underneath I could not quite get this off easily and therefore I'm more apt to leave it in place until it starts to loosen up a little bit more than its own. In regard to the right it appears that this is doing much better there's more granulation as well as epithelialization noted and she's not having any pain like what she was previous. Patient History Information obtained from Patient. Family History Cancer - Mother, Diabetes - Siblings, Heart Disease - Father,Siblings, Hypertension - Siblings, Kidney Disease - Siblings, No family history of Hereditary Spherocytosis, Lung Disease, Seizures. Social History Never smoker, Marital Status - Married, Alcohol Use - Never, Drug Use - No History, Caffeine Use - Rarely. Medical History Eyes Denies history of Cataracts, Glaucoma, Optic Neuritis Ear/Nose/Mouth/Throat Denies history of Chronic sinus problems/congestion, Middle ear problems Hematologic/Lymphatic Denies history of Anemia, Hemophilia, Human Immunodeficiency Virus, Lymphedema, Sickle Cell Disease Respiratory Denies history of Aspiration, Asthma, Chronic Obstructive Pulmonary Disease (COPD), Pneumothorax, Sleep Apnea, Tuberculosis Cardiovascular Patient has history of Congestive Heart Failure, Coronary Artery Disease Denies history of Angina, Arrhythmia, Deep Vein Thrombosis, Hypertension, Hypotension, Myocardial Infarction, Peripheral Arterial Disease, Peripheral Venous Disease, Phlebitis, Vasculitis Gastrointestinal Denies history of Cirrhosis , Colitis, Crohn s, Hepatitis A, Hepatitis B,  Hepatitis C Endocrine Denies history of Type I Diabetes, Type II Diabetes Genitourinary Patient has history of End Stage Renal Disease Immunological Denies history of Lupus Erythematosus, Raynaud s, Scleroderma Integumentary (Skin) Denies history of History of Burn, History of pressure wounds Musculoskeletal Denies history of Gout, Rheumatoid Arthritis, Osteoarthritis, Osteomyelitis Neurologic Denies history of Dementia, Neuropathy, Quadriplegia, Paraplegia Oncologic Elizabeth Fields, [name] ([id number]) Denies history of Received Chemotherapy, Received Radiation Psychiatric Denies history of Anorexia/bulimia, Confinement Anxiety Review of Systems (ROS) Constitutional Symptoms (General Health) Denies complaints or symptoms of Fever, Chills. Respiratory The patient has no complaints or symptoms. Cardiovascular The patient has no complaints or symptoms. Psychiatric The patient has no complaints or symptoms. Objective Constitutional Well-nourished and well-hydrated in no acute distress. Vitals Time Taken: 1:45 PM, Height: 61 in, Weight: 100 lbs, BMI: 18.9, Temperature: 98.0 F, Pulse: 68 bpm, Respiratory Rate: 16 breaths/min, Blood Pressure: 121/54 mmHg. Respiratory normal breathing without difficulty. clear to auscultation bilaterally. Cardiovascular regular rate and rhythm with normal S1, S2. Psychiatric this patient is able to make decisions and demonstrates good insight into disease process. Alert and Oriented x 3. pleasant and cooperative. General Notes: Patient's wound bed currently shows signs of good granulation at this time on the right lower extremity injuries shin. She also had good epithelialization especially at the proximal portion of the wound and the slough is clearly not quite nicely in regard to the distal portion wound overall I'm excited about the fact that this seems to be showing signs of improvement and no signs of infection. Per patient request no debridement was  undertaken today. Integumentary (Hair, Skin) Wound #1 status is Open. Original cause of wound was Trauma. The wound is located on the Right,Anterior Lower Leg. The wound  measures 1cm length x 0.6cm width x 0.1cm depth; 0.471cm^2 area and 0.047cm^3 volume. There is Fat Layer (Subcutaneous Tissue) Exposed exposed. There is no tunneling or undermining noted. There is a medium amount of serous drainage noted. The wound margin is flat and intact. There is no granulation within the wound bed. There is a large (67-100%) amount of necrotic tissue within the wound bed including Adherent Slough. The periwound skin appearance exhibited: Hemosiderin Staining. The periwound skin appearance did not exhibit: Callus, Crepitus, Excoriation, Induration, Rash, Scarring, Dry/Scaly, Maceration, Atrophie Blanche, Cyanosis, Ecchymosis, Mottled, Pallor, Rubor, Erythema. Periwound temperature was noted as No Abnormality. The periwound has tenderness on palpation. Wound #2 status is Open. Original cause of wound was Trauma. The wound is located on the Left,Anterior Lower Leg. The wound measures 0.1cm length x 0.1cm width x 0.1cm depth; 0.008cm^2 area and 0.001cm^3 volume. There is Fat Layer Cuccaro, Dhanya C. ([id number]) (Subcutaneous Tissue) Exposed exposed. There is no tunneling or undermining noted. There is a medium amount of serous drainage noted. The wound margin is flat and intact. There is no granulation within the wound bed. There is a large (67-100%) amount of necrotic tissue within the wound bed including Eschar. The periwound skin appearance exhibited: Hemosiderin Staining. The periwound skin appearance did not exhibit: Callus, Crepitus, Excoriation, Induration, Rash, Scarring, Dry/Scaly, Maceration, Atrophie Blanche, Cyanosis, Ecchymosis, Mottled, Pallor, Rubor, Erythema. Periwound temperature was noted as No Abnormality. The periwound has tenderness on palpation. Assessment Active Problems ICD-10 Non-pressure  chronic ulcer of other part of right lower leg with fat layer exposed Non-pressure chronic ulcer of other part of left lower leg with fat layer exposed Unspecified open wound, left lower leg, initial encounter Unspecified open wound, right lower leg, initial encounter Paroxysmal atrial fibrillation Long term (current) use of anticoagulants Chronic kidney disease, stage 3 (moderate) Procedures Wound #1 Pre-procedure diagnosis of Wound #1 is a Trauma, Other located on the Right,Anterior Lower Leg . There was a Chemical/Enzymatic/Mechanical debridement performed by STONE III, Londyn Wotton E., PA-C. With the following instrument(s): tongue depressor after achieving pain control using Lidocaine 4% Topical Solution. Agent used was [employer]. A time out was conducted at 14:15, prior to the start of the procedure. There was no bleeding. The procedure was tolerated well with a pain level of 0 throughout and a pain level of 0 following the procedure. Post Debridement Measurements: 1cm length x 0.6cm width x 0.1cm depth; 0.047cm^3 volume. Character of Wound/Ulcer Post Debridement is improved. Post procedure Diagnosis Wound #1: Same as Pre-Procedure Plan Wound Cleansing: Wound #1 Right,Anterior Lower Leg: May Shower, gently pat wound dry prior to applying new dressing. - Clean with soap and water Primary Wound Dressing: Wound #1 Right,Anterior Lower Leg: Medihoney gel - Santyl in clinic Wound #2 Left,Anterior Lower Leg: Other: - Leave this wound open to air with no treatment Secondary Dressing: Wound #1 Right,Anterior Lower Leg: Conform/Kerlix - secure with netting Epting, Chaney C. ([id number]) Non-adherent pad Dressing Change Frequency: Wound #1 Right,Anterior Lower Leg: Change dressing every day. Follow-up Appointments: Wound #1 Right,Anterior Lower Leg: Return Appointment in 1 week. Wound #2 Left,Anterior Lower Leg: Return Appointment in 1 week. Edema Control: Wound #1 Right,Anterior Lower  Leg: Elevate legs to the level of the heart and pump ankles as often as possible My suggestion is that we go ahead and continue with the above wound care measures for the next week and the patient is in agreement with that plan. We will subsequently see were things stand at follow-up. If anything changes  or worsens in the meantime she will let me know. Please see above for specific wound care orders. We will see patient for re-evaluation in 1 week(s) here in the clinic. If anything worsens or changes patient will contact our office for additional recommendations. Electronic Signature(s) Signed: 03/21/2018 5:40:27 PM By: Worthy Keeler PA-C Entered By: Worthy Keeler on 03/21/2018 15:05:56 Tiso, Brytni C. ([id number]) -------------------------------------------------------------------------------- ROS/PFSH Details Patient Name: Elizabeth Fields, Oliviah C. Date of Service: 03/21/2018 1:45 PM Medical Record Number: [id number] Patient Account Number: [id number] Date of Birth/Sex: 1931/12/22 (85 y.o. F) Treating RN: Montey Hora Primary Care Provider: Harrel Lemon Other Clinician: Referring Provider: Harrel Lemon Treating Provider/Extender: Melburn Hake, Raela Bohl Weeks in Treatment: 9 Information Obtained From Patient Wound History Do you currently have one or more open woundso Yes How many open wounds do you currently haveo 2 Approximately how long have you had your woundso 8 weeks How have you been treating your wound(s) until nowo peroxide, neosporin and wound cleanser and bandage. Has your wound(s) ever healed and then re-openedo No Have you had any lab work done in the past montho No Have you tested positive for an antibiotic resistant organism No (MRSA, VRE)o Have you tested positive for osteomyelitis (bone infection)o No Have you had any tests for circulation on your legso No Constitutional Symptoms (General Health) Complaints and Symptoms: Negative for: Fever; Chills Eyes Medical  History: Negative for: Cataracts; Glaucoma; Optic Neuritis Ear/Nose/Mouth/Throat Medical History: Negative for: Chronic sinus problems/congestion; Middle ear problems Hematologic/Lymphatic Medical History: Negative for: Anemia; Hemophilia; Human Immunodeficiency Virus; Lymphedema; Sickle Cell Disease Respiratory Complaints and Symptoms: No Complaints or Symptoms Medical History: Negative for: Aspiration; Asthma; Chronic Obstructive Pulmonary Disease (COPD); Pneumothorax; Sleep Apnea; Tuberculosis Cardiovascular Complaints and Symptoms: No Complaints or Symptoms Scroggins, Lavinia C. ([id number]) Medical History: Positive for: Congestive Heart Failure; Coronary Artery Disease Negative for: Angina; Arrhythmia; Deep Vein Thrombosis; Hypertension; Hypotension; Myocardial Infarction; Peripheral Arterial Disease; Peripheral Venous Disease; Phlebitis; Vasculitis Gastrointestinal Medical History: Negative for: Cirrhosis ; Colitis; Crohnos; Hepatitis A; Hepatitis B; Hepatitis C Endocrine Medical History: Negative for: Type I Diabetes; Type II Diabetes Genitourinary Medical History: Positive for: End Stage Renal Disease Immunological Medical History: Negative for: Lupus Erythematosus; Raynaudos; Scleroderma Integumentary (Skin) Medical History: Negative for: History of Burn; History of pressure wounds Musculoskeletal Medical History: Negative for: Gout; Rheumatoid Arthritis; Osteoarthritis; Osteomyelitis Neurologic Medical History: Negative for: Dementia; Neuropathy; Quadriplegia; Paraplegia Oncologic Medical History: Negative for: Received Chemotherapy; Received Radiation Psychiatric Complaints and Symptoms: No Complaints or Symptoms Medical History: Negative for: Anorexia/bulimia; Confinement Anxiety Immunizations Pneumococcal Vaccine: Received Pneumococcal Vaccination: Yes Implantable Devices No devices added Feltes, Erinne C. ([id number]) Family and Social History Cancer: Yes  - Mother; Diabetes: Yes - Siblings; Heart Disease: Yes - Father,Siblings; Hereditary Spherocytosis: No; Hypertension: Yes - Siblings; Kidney Disease: Yes - Siblings; Lung Disease: No; Seizures: No; Never smoker; Marital Status - Married; Alcohol Use: Never; Drug Use: No History; Caffeine Use: Rarely; Financial Concerns: No; Food, Clothing or Shelter Needs: No; Support System Lacking: No; Transportation Concerns: No; Advanced Directives: Yes (Not Provided); Patient does not want information on Advanced Directives; Do not resuscitate: Yes (Not Provided); Living Will: Yes (Not Provided); Medical Power of Attorney: Yes (Not Provided) Physician Affirmation I have reviewed and agree with the above information. Electronic Signature(s) Signed: 03/21/2018 5:05:27 PM By: Montey Hora Signed: 03/21/2018 5:40:27 PM By: Worthy Keeler PA-C Entered By: Worthy Keeler on 03/21/2018 15:05:23 Muska, Daphane C. ([id number]) -------------------------------------------------------------------------------- SuperBill Details Patient Name: Ewan, Rickayla C. Date of Service:  03/21/2018 Medical Record Number: [id number] Patient Account Number: [id number] Date of Birth/Sex: October 27, 1931 (85 y.o. F) Treating RN: Montey Hora Primary Care Provider: Harrel Lemon Other Clinician: Referring Provider: Harrel Lemon Treating Provider/Extender: Melburn Hake, Nakea Gouger Weeks in Treatment: 9 Diagnosis Coding ICD-10 Codes Code Description [phone] Non-pressure chronic ulcer of other part of right lower leg with fat layer exposed L97.822 Non-pressure chronic ulcer of other part of left lower leg with fat layer exposed S81.802A Unspecified open wound, left lower leg, initial encounter S81.801A Unspecified open wound, right lower leg, initial encounter I48.0 Paroxysmal atrial fibrillation Z79.01 Long term (current) use of anticoagulants N18.3 Chronic kidney disease, stage 3 (moderate) Facility Procedures CPT4 Code:  [id number] Description: 33744 - DEBRIDE W/O ANES NON SELECT Modifier: Quantity: 1 Physician Procedures CPT4 Code Description: [id number] - WC PHYS LEVEL 4 - EST PT ICD-10 Diagnosis Description J58.727 Non-pressure chronic ulcer of other part of right lower leg w M18.485 Non-pressure chronic ulcer of other part of left lower leg wi S81.802A Unspecified  open wound, left lower leg, initial encounter S81.801A Unspecified open wound, right lower leg, initial encounter Modifier: ith fat layer expo th fat layer expos Quantity: 1 sed ed Electronic Signature(s) Signed: 03/21/2018 5:40:27 PM By: Worthy Keeler PA-C Entered By: Worthy Keeler on 03/21/2018 15:06:09

## 2018-03-25 NOTE — Progress Notes (Signed)
HARNOOR, RETA ([id number]) Visit Report for 03/21/2018 Arrival Information Details Patient Name: Rampey, [name] Date of Service: 03/21/2018 1:45 PM Medical Record Number: [id number] Patient Account Number: [id number] Date of Birth/Sex: 07-26-1931 (86 y.o. F) Treating RN: Harold Barban Primary Care Ladarion Munyon: Harrel Lemon Other Clinician: Referring Jearline Hirschhorn: Harrel Lemon Treating Edda Orea/Extender: Melburn Hake, HOYT Weeks in Treatment: 9 Visit Information History Since Last Visit Added or deleted any medications: No Patient Arrived: Ambulatory Any new allergies or adverse reactions: No Arrival Time: 13:45 Had a fall or experienced change in No Accompanied By: self activities of daily living that may affect Transfer Assistance: None risk of falls: Patient Identification Verified: Yes Signs or symptoms of abuse/neglect since last visito No Secondary Verification Process Completed: Yes Hospitalized since last visit: No Has Dressing in Place as Prescribed: Yes Pain Present Now: No Electronic Signature(s) Signed: 03/24/2018 5:03:00 PM By: Harold Barban Entered By: Harold Barban on 03/21/2018 13:46:49 Duan, Jaycee C. ([id number]) -------------------------------------------------------------------------------- Encounter Discharge Information Details Patient Name: Aguirre, Evangaline C. Date of Service: 03/21/2018 1:45 PM Medical Record Number: [id number] Patient Account Number: [id number] Date of Birth/Sex: May 11, 1931 (86 y.o. F) Treating RN: Montey Hora Primary Care Azizah Lisle: Harrel Lemon Other Clinician: Referring Coryn Mosso: Harrel Lemon Treating Zury Fazzino/Extender: Melburn Hake, HOYT Weeks in Treatment: 9 Encounter Discharge Information Items Post Procedure Vitals Discharge Condition: Stable Temperature (F): 98.0 Ambulatory Status: Ambulatory Pulse (bpm): 68 Discharge Destination: Home Respiratory Rate (breaths/min): 18 Transportation: Private Auto Blood Pressure (mmHg):  121/54 Accompanied By: self Schedule Follow-up Appointment: Yes Clinical Summary of Care: Electronic Signature(s) Signed: 03/21/2018 2:27:42 PM By: Montey Hora Previous Signature: 03/21/2018 2:27:15 PM Version By: Montey Hora Entered By: Montey Hora on 03/21/2018 14:27:42 Petrides, Marelin C. ([id number]) -------------------------------------------------------------------------------- Lower Extremity Assessment Details Patient Name: Kruzel, Mackinley C. Date of Service: 03/21/2018 1:45 PM Medical Record Number: [id number] Patient Account Number: [id number] Date of Birth/Sex: 09/01/1931 (86 y.o. F) Treating RN: Harold Barban Primary Care Krishan Mcbreen: Harrel Lemon Other Clinician: Referring Sylvanus Telford: Harrel Lemon Treating Naydene Kamrowski/Extender: Melburn Hake, HOYT Weeks in Treatment: 9 Electronic Signature(s) Signed: 03/24/2018 5:03:00 PM By: Harold Barban Entered By: Harold Barban on 03/21/2018 13:55:17 Delahunt, Sayre C. ([id number]) -------------------------------------------------------------------------------- Multi Wound Chart Details Patient Name: Donigan, Zakiah C. Date of Service: 03/21/2018 1:45 PM Medical Record Number: [id number] Patient Account Number: [id number] Date of Birth/Sex: 1931/12/07 (86 y.o. F) Treating RN: Montey Hora Primary Care Herman Fiero: Harrel Lemon Other Clinician: Referring Maila Dukes: Harrel Lemon Treating Averyana Pillars/Extender: Melburn Hake, HOYT Weeks in Treatment: 9 Vital Signs Height(in): 61 Pulse(bpm): 68 Weight(lbs): 100 Blood Pressure(mmHg): 121/54 Body Mass Index(BMI): 19 Temperature(F): 98.0 Respiratory Rate 16 (breaths/min): Photos: [N/A:N/A] Wound Location: Right Lower Leg - Anterior Left Lower Leg - Anterior N/A Wounding Event: Trauma Trauma N/A Primary Etiology: Trauma, Other Trauma, Other N/A Comorbid History: Congestive Heart Failure, Congestive Heart Failure, N/A Coronary Artery Disease, End Coronary Artery Disease, End Stage Renal Disease  Stage Renal Disease Date Acquired: 12/26/2017 11/17/2017 N/A Weeks of Treatment: 9 9 N/A Wound Status: Open Open N/A Measurements L x W x D 1x0.6x0.1 0.1x0.1x0.1 N/A (cm) Area (cm) : 0.471 0.008 N/A Volume (cm) : 0.047 0.001 N/A % Reduction in Area: 81.80% 98.70% N/A % Reduction in Volume: 81.90% 98.40% N/A Classification: Full Thickness Without Full Thickness Without N/A Exposed Support Structures Exposed Support Structures Exudate Amount: Medium Medium N/A Exudate Type: Serous Serous N/A Exudate Color: amber amber N/A Wound Margin: Flat and Intact Flat and Intact N/A Granulation Amount: None Present (0%) None Present (0%) N/A Necrotic Amount: Large (67-100%) Large (67-100%)  N/A Necrotic Tissue: Adherent Slough Eschar N/A Exposed Structures: Fat Layer (Subcutaneous Fat Layer (Subcutaneous N/A Tissue) Exposed: Yes Tissue) Exposed: Yes Fascia: No Fascia: No Tendon: No Tendon: No Muscle: No Muscle: No Nair, Jaxie C. ([id number]) Joint: No Joint: No Bone: No Bone: No Epithelialization: None None N/A Periwound Skin Texture: Excoriation: No Excoriation: No N/A Induration: No Induration: No Callus: No Callus: No Crepitus: No Crepitus: No Rash: No Rash: No Scarring: No Scarring: No Periwound Skin Moisture: Maceration: No Maceration: No N/A Dry/Scaly: No Dry/Scaly: No Periwound Skin Color: Hemosiderin Staining: Yes Hemosiderin Staining: Yes N/A Atrophie Blanche: No Atrophie Blanche: No Cyanosis: No Cyanosis: No Ecchymosis: No Ecchymosis: No Erythema: No Erythema: No Mottled: No Mottled: No Pallor: No Pallor: No Rubor: No Rubor: No Temperature: No Abnormality No Abnormality N/A Tenderness on Palpation: Yes Yes N/A Wound Preparation: Ulcer Cleansing: Ulcer Cleansing: N/A Rinsed/Irrigated with Saline Rinsed/Irrigated with Saline Topical Anesthetic Applied: Topical Anesthetic Applied: Other: lidocaine 4% Other: lidocaine 4% Treatment  Notes Electronic Signature(s) Signed: 03/21/2018 5:05:27 PM By: Montey Hora Entered By: Montey Hora on 03/21/2018 14:08:58 Boeke, Aprill C. ([id number]) -------------------------------------------------------------------------------- Dozier Details Patient Name: Ohair, Tashya C. Date of Service: 03/21/2018 1:45 PM Medical Record Number: [id number] Patient Account Number: [id number] Date of Birth/Sex: 1931/06/19 (86 y.o. F) Treating RN: Montey Hora Primary Care Rashaun Curl: Harrel Lemon Other Clinician: Referring [place] Dost: Harrel Lemon Treating Mahayla Haddaway/Extender: Melburn Hake, HOYT Weeks in Treatment: 9 Active Inactive Abuse / Safety / Falls / Self Care Management Nursing Diagnoses: Potential for falls Goals: Patient will not experience any injury related to falls Date Initiated: 01/17/2018 Target Resolution Date: 04/12/2018 Goal Status: Active Interventions: Assess fall risk on admission and as needed Notes: Orientation to the Wound Care Program Nursing Diagnoses: Knowledge deficit related to the wound healing center program Goals: Patient/caregiver will verbalize understanding of the [facility] Date Initiated: 01/17/2018 Target Resolution Date: 04/12/2018 Goal Status: Active Interventions: Provide education on orientation to the wound center Notes: Wound/Skin Impairment Nursing Diagnoses: Impaired tissue integrity Goals: Ulcer/skin breakdown will heal within 14 weeks Date Initiated: 01/17/2018 Target Resolution Date: 04/12/2018 Goal Status: Active Interventions: Assess patient/caregiver ability to obtain necessary supplies Brant, Shandy C. ([id number]) Assess patient/caregiver ability to perform ulcer/skin care regimen upon admission and as needed Assess ulceration(s) every visit Notes: Electronic Signature(s) Signed: 03/21/2018 5:05:27 PM By: Montey Hora Entered By: Montey Hora on 03/21/2018 14:08:32 Yohn, Xiadani C.  ([id number]) -------------------------------------------------------------------------------- Pain Assessment Details Patient Name: Ewalt, Florabel C. Date of Service: 03/21/2018 1:45 PM Medical Record Number: [id number] Patient Account Number: [id number] Date of Birth/Sex: 1931-03-29 (86 y.o. F) Treating RN: Harold Barban Primary Care Idali Lafever: Harrel Lemon Other Clinician: Referring Cing : Harrel Lemon Treating Daemien Fronczak/Extender: Melburn Hake, HOYT Weeks in Treatment: 9 Active Problems Location of Pain Severity and Description of Pain Patient Has Paino No Site Locations Pain Management and Medication Current Pain Management: Electronic Signature(s) Signed: 03/24/2018 5:03:00 PM By: Harold Barban Entered By: Harold Barban on 03/21/2018 13:47:05 Chai, Vaniya C. ([id number]) -------------------------------------------------------------------------------- Patient/Caregiver Education Details Patient Name: Deupree, Jaylah C. Date of Service: 03/21/2018 1:45 PM Medical Record Number: [id number] Patient Account Number: [id number] Date of Birth/Gender: 07-05-1931 (85 y.o. F) Treating RN: Montey Hora Primary Care Physician: Harrel Lemon Other Clinician: Referring Physician: Harrel Lemon Treating Physician/Extender: Sharalyn Ink in Treatment: 9 Education Assessment Education Provided To: Patient Education Topics Provided Wound/Skin Impairment: Handouts: Other: wound care as ordered Methods: Demonstration, Explain/Verbal Responses: State content correctly Electronic Signature(s) Signed: 03/21/2018 5:05:27 PM By: Montey Hora Entered  By: Montey Hora on 03/21/2018 14:26:35 Howald, Abegail CMarland Kitchen ([id number]) -------------------------------------------------------------------------------- Wound Assessment Details Patient Name: Maulding, Jonae C. Date of Service: 03/21/2018 1:45 PM Medical Record Number: [id number] Patient Account Number: [id number] Date of Birth/Sex: [birth year] (86  y.o. F) Treating RN: Harold Barban Primary Care Welma Mccombs: Harrel Lemon Other Clinician: Referring Sandara Tyree: Harrel Lemon Treating Elnora Quizon/Extender: Melburn Hake, HOYT Weeks in Treatment: 9 Wound Status Wound Number: 1 Primary Trauma, Other Etiology: Wound Location: Right Lower Leg - Anterior Wound Open Wounding Event: Trauma Status: Date Acquired: 12/26/2017 Comorbid Congestive Heart Failure, Coronary Artery Weeks Of Treatment: 9 History: Disease, End Stage Renal Disease Clustered Wound: No Photos Wound Measurements Length: (cm) 1 Width: (cm) 0.6 Depth: (cm) 0.1 Area: (cm) 0.471 Volume: (cm) 0.047 % Reduction in Area: 81.8% % Reduction in Volume: 81.9% Epithelialization: None Tunneling: No Undermining: No Wound Description Full Thickness Without Exposed Support Classification: Structures Wound Margin: Flat and Intact Exudate Medium Amount: Exudate Type: Serous Exudate Color: amber Foul Odor After Cleansing: No Slough/Fibrino Yes Wound Bed Granulation Amount: None Present (0%) Exposed Structure Necrotic Amount: Large (67-100%) Fascia Exposed: No Necrotic Quality: Adherent Slough Fat Layer (Subcutaneous Tissue) Exposed: Yes Tendon Exposed: No Muscle Exposed: No Joint Exposed: No Bone Exposed: No Periwound Skin Texture Obst, Esta C. ([id number]) Texture Color No Abnormalities Noted: No No Abnormalities Noted: No Callus: No Atrophie Blanche: No Crepitus: No Cyanosis: No Excoriation: No Ecchymosis: No Induration: No Erythema: No Rash: No Hemosiderin Staining: Yes Scarring: No Mottled: No Pallor: No Moisture Rubor: No No Abnormalities Noted: No Dry / Scaly: No Temperature / Pain Maceration: No Temperature: No Abnormality Tenderness on Palpation: Yes Wound Preparation Ulcer Cleansing: Rinsed/Irrigated with Saline Topical Anesthetic Applied: Other: lidocaine 4%, Treatment Notes Wound #1 (Right, Anterior Lower Leg) Notes santyl,  telfa, conform on right, left open to air Electronic Signature(s) Signed: 03/24/2018 5:03:00 PM By: Harold Barban Entered By: Harold Barban on 03/21/2018 13:53:58 Holsapple, Maeleigh C. ([id number]) -------------------------------------------------------------------------------- Wound Assessment Details Patient Name: Vanriper, Marjean C. Date of Service: 03/21/2018 1:45 PM Medical Record Number: [id number] Patient Account Number: [id number] Date of Birth/Sex: 1931/03/02 (86 y.o. F) Treating RN: Harold Barban Primary Care Sayana Salley: Harrel Lemon Other Clinician: Referring Avalina Benko: Harrel Lemon Treating Gabbi Whetstone/Extender: Melburn Hake, HOYT Weeks in Treatment: 9 Wound Status Wound Number: 2 Primary Trauma, Other Etiology: Wound Location: Left Lower Leg - Anterior Wound Open Wounding Event: Trauma Status: Date Acquired: 11/17/2017 Comorbid Congestive Heart Failure, Coronary Artery Weeks Of Treatment: 9 History: Disease, End Stage Renal Disease Clustered Wound: No Photos Wound Measurements Length: (cm) 0.1 Width: (cm) 0.1 Depth: (cm) 0.1 Area: (cm) 0.008 Volume: (cm) 0.001 % Reduction in Area: 98.7% % Reduction in Volume: 98.4% Epithelialization: None Tunneling: No Undermining: No Wound Description Full Thickness Without Exposed Support Classification: Structures Wound Margin: Flat and Intact Exudate Medium Amount: Exudate Type: Serous Exudate Color: amber Foul Odor After Cleansing: No Slough/Fibrino Yes Wound Bed Granulation Amount: None Present (0%) Exposed Structure Necrotic Amount: Large (67-100%) Fascia Exposed: No Necrotic Quality: Eschar Fat Layer (Subcutaneous Tissue) Exposed: Yes Tendon Exposed: No Muscle Exposed: No Joint Exposed: No Bone Exposed: No Periwound Skin Texture Hulon, Shatasia C. ([id number]) Texture Color No Abnormalities Noted: No No Abnormalities Noted: No Callus: No Atrophie Blanche: No Crepitus: No Cyanosis: No Excoriation:  No Ecchymosis: No Induration: No Erythema: No Rash: No Hemosiderin Staining: Yes Scarring: No Mottled: No Pallor: No Moisture Rubor: No No Abnormalities Noted: No Dry / Scaly: No Temperature / Pain Maceration: No Temperature: No Abnormality Tenderness on Palpation: Yes Wound  Preparation Ulcer Cleansing: Rinsed/Irrigated with Saline Topical Anesthetic Applied: Other: lidocaine 4%, Treatment Notes Wound #2 (Left, Anterior Lower Leg) Notes santyl, telfa, conform on right, left open to air Electronic Signature(s) Signed: 03/24/2018 5:03:00 PM By: Harold Barban Entered By: Harold Barban on 03/21/2018 13:54:33 Gidley, Nami C. ([id number]) -------------------------------------------------------------------------------- Vitals Details Patient Name: Macphail, Lark C. Date of Service: 03/21/2018 1:45 PM Medical Record Number: [id number] Patient Account Number: [id number] Date of Birth/Sex: 21-Dec-1931 (86 y.o. F) Treating RN: Harold Barban Primary Care Jeramiah Mccaughey: Harrel Lemon Other Clinician: Referring Shamya Macfadden: Harrel Lemon Treating Dorell Gatlin/Extender: Melburn Hake, HOYT Weeks in Treatment: 9 Vital Signs Time Taken: 13:45 Temperature (F): 98.0 Height (in): 61 Pulse (bpm): 68 Weight (lbs): 100 Respiratory Rate (breaths/min): 16 Body Mass Index (BMI): 18.9 Blood Pressure (mmHg): 121/54 Reference Range: 80 - 120 mg / dl Electronic Signature(s) Signed: 03/24/2018 5:03:00 PM By: Harold Barban Entered By: Harold Barban on 03/21/2018 13:48:49

## 2018-03-28 ENCOUNTER — Other Ambulatory Visit: Payer: Self-pay

## 2018-03-28 ENCOUNTER — Encounter: Payer: PPO | Admitting: Physician Assistant

## 2018-03-28 DIAGNOSIS — E11622 Type 2 diabetes mellitus with other skin ulcer: Secondary | ICD-10-CM | POA: Diagnosis not present

## 2018-03-28 DIAGNOSIS — L97812 Non-pressure chronic ulcer of other part of right lower leg with fat layer exposed: Secondary | ICD-10-CM | POA: Diagnosis not present

## 2018-03-28 DIAGNOSIS — L97822 Non-pressure chronic ulcer of other part of left lower leg with fat layer exposed: Secondary | ICD-10-CM | POA: Diagnosis not present

## 2018-03-29 NOTE — Progress Notes (Signed)
[name], [name] ([id number]) Visit Report for 03/28/2018 Chief Complaint Document Details Patient Name: Elizabeth Fields, [name] Date of Service: 03/28/2018 10:30 AM Medical Record Number: [id number] Patient Account Number: [id number] Date of Birth/Sex: 10/12/1931 (86 y.o. F) Treating RN: Montey Hora Primary Care Provider: Harrel Lemon Other Clinician: Referring Provider: Harrel Lemon Treating Provider/Extender: Melburn Hake, Greer Koeppen Weeks in Treatment: 10 Information Obtained from: Patient Chief Complaint Bilateral LE Ulcers Electronic Signature(s) Signed: 03/28/2018 11:59:00 AM By: Worthy Keeler PA-C Entered By: Worthy Keeler on 03/28/2018 11:06:12 Rittman, Yeraldin C. ([id number]) -------------------------------------------------------------------------------- Debridement Details Patient Name: Elizabeth Fields, [name] Date of Service: 03/28/2018 10:30 AM Medical Record Number: [id number] Patient Account Number: [id number] Date of Birth/Sex: Sep 08, 1931 (86 y.o. F) Treating RN: Montey Hora Primary Care Provider: Harrel Lemon Other Clinician: Referring Provider: Harrel Lemon Treating Provider/Extender: Melburn Hake, Teondra Newburg Weeks in Treatment: 10 Debridement Performed for Wound #1 Right,Anterior Lower Leg Assessment: Performed By: Physician STONE III, Callan Yontz E., PA-C Debridement Type: Chemical/Enzymatic/Mechanical Agent Used: Santyl Level of Consciousness (Pre- Awake and Alert procedure): Pre-procedure Verification/Time Yes - 11:10 Out Taken: Start Time: 11:10 Pain Control: Lidocaine 4% Topical Solution Instrument: Other : tongue blade Bleeding: None End Time: 11:10 Procedural Pain: 0 Post Procedural Pain: 0 Response to Treatment: Procedure was tolerated well Level of Consciousness Awake and Alert (Post-procedure): Post Debridement Measurements of Total Wound Length: (cm) 0.9 Width: (cm) 0.5 Depth: (cm) 0.1 Volume: (cm) 0.035 Character of Wound/Ulcer Post Debridement: Improved Post  Procedure Diagnosis Same as Pre-procedure Electronic Signature(s) Signed: 03/28/2018 11:59:00 AM By: Worthy Keeler PA-C Signed: 03/28/2018 3:50:35 PM By: Montey Hora Entered By: Montey Hora on 03/28/2018 11:11:40 Simar, Angellynn C. ([id number]) -------------------------------------------------------------------------------- HPI Details Patient Name: Elizabeth Fields, [name] Date of Service: 03/28/2018 10:30 AM Medical Record Number: [id number] Patient Account Number: [id number] Date of Birth/Sex: 31-Mar-1931 (86 y.o. F) Treating RN: Montey Hora Primary Care Provider: Harrel Lemon Other Clinician: Referring Provider: Harrel Lemon Treating Provider/Extender: Melburn Hake, Shellene Sweigert Weeks in Treatment: 10 History of Present Illness HPI Description: 01/17/18 on evaluation today patient presents for evaluation of two ulcers both on the anterior portion of her lower extremities one right and one left. The right she has had for about three weeks the left for about eight weeks and both occurred as a result of her traumatizing her shins on the bottom of the card were trying to get out of the car. She states this is never happen with any other car doors before but she has been having some issues with this sharp area on the store in particular. Both occurred in the similar situation where she was trying not to open the door the whole way in order to avoid damaging anybody else's car beside them. She is been using peroxide initially to clean the wound although she has converted to the wound cleanser now. She has been using if anything just over the counter triple antibiotic ointment and a Band-Aid. With that being said she's not even been using that more recently. The most part she's just been attempting allow this to dry out/scab over. Her APIs were greater than 220 and noncompressible. Patient does have a history of paroxysmal atrial fibrillation for which she is on Coumadin. She also has chronic kidney disease  stage III. She has been on antibiotics that I see no evidence of infection right now which is good news. 01/24/18 on evaluation today patient appears to be doing excellent in regard to her lower extremity ulcers. She has been tolerating the dressing changes without  complication which is excellent news. Overall I have been very pleased with her progress. Nonetheless she still has have some ways to go before this will be completely healed. The left is a little bit better in appearance compared to the right which is somewhat deeper. 01/31/18 on evaluation today patient appears to be doing better in regard to her bilateral anterior lower extremity ulcers. She has been tolerating the dressing changes without complication which is excellent news. Fortunately there does not appear to be any evidence of infection at this time which is also good news. I'm very pleased with how things seem to be progressing at this point. The patient likewise is very happy and states she's not having the pain that she was having previous. 02/07/18 on evaluation today patient appears to be doing rather well in regard to her ulcers. The left especially seems to be making good progress in the right does have more slough and poor granulation tissue on the surface at this point. I think this is gonna require little bit more extensive debridement. Fortunately there's no signs of infection at this time. 02/14/18 on evaluation today patient appears to be doing better in the overall appearance of the wounds although she tells me she's been having more burning over the past week. There appears to be some adhesive irritation. That has me somewhat concerned as far as the reason for the burning is concerned. With that being said I do not see any signs of actual infection which is good news. No fevers, chills, nausea, or vomiting noted at this time. Overall there does not appear to be any sign that there is worsening to me and the only reason  the right extremity wound is bigger is that I actually performed a fairly significant debridement last week. That maybe some of the reason why she's had a little bit more pain as well. 02/21/18 on evaluation today patient appears to be doing poorly in regard to her lower extremity ulcers unfortunately. She continues to have discomfort despite the dressing switch I don't think the dressing is the issue I believe she may actually have an infection and that could be the problem that we're facing at this point. Fortunately there's no signs of systemic infection. No fevers, chills, nausea, or vomiting noted at this time. Unfortunately I'm just not seeing the progress that I would expect especially after cleaning the wounds off sufficiently. 02/28/18 on evaluation today patient appears to be doing a little better in regard to her bilateral anterior lower Trinity ulcer. She still has some inflammation surrounding the wound but not nearly as bad as last week. Her culture did come back showing positive for Pseudomonas as the organism likely responsible for the infection. For that reason I did stop the doxycycline sent in a prescription for Levaquin for her today. Other than that and pleased with how things seem to be progressing I do believe the alginate have been better for her. 03/07/18 on evaluation today patient actually appears to be doing very well in regard to her bilateral lower Trinity ulcer's. In fact she seems to be showing signs of excellent improvement I do believe that the Levaquin we prescribed has been of benefit for Rask, Janiqua C. ([id number]) her the infection appears to be clearing quite nicely. I'm very pleased in that regard. Her pain is also much less severe compared to what it was previous. 03/14/18 on evaluation today patient actually appears to be doing much better in regard to her bilateral lower is  from the ulcers. In fact she is appearing to almost be if not completely healed in regard  to left lower extremity. Parts of the right lower extremity this is significantly improved although not completely healed. Fortunately there's no signs of active infection at this time I believe we may finally have got not under control which is excellent. 03/21/18 on evaluation today patient actually appears to be doing much better in regard to her bilateral lower extremity ulcer. The left ulcer still show signs of being covered with a dry eschar although it appears this is likely healed underneath I could not quite get this off easily and therefore I'm more apt to leave it in place until it starts to loosen up a little bit more than its own. In regard to the right it appears that this is doing much better there's more granulation as well as epithelialization noted and she's not having any pain like what she was previous. 03/28/18 on evaluation today patient actually appears to be doing very well in regard to her bilateral lower extremity ulcer's. In fact the left leg ulcer is completely healed at this point. The right leg ulcer is doing much better and show signs of excellent improvement although it's not healed yet. I do feel like she's making good progress. Electronic Signature(s) Signed: 03/28/2018 11:59:00 AM By: Worthy Keeler PA-C Entered By: Worthy Keeler on 03/28/2018 11:38:18 Romulus, Dezhane CMarland Kitchen ([id number]) -------------------------------------------------------------------------------- Physical Exam Details Patient Name: Elizabeth Fields, [name] Date of Service: 03/28/2018 10:30 AM Medical Record Number: [id number] Patient Account Number: [id number] Date of Birth/Sex: 08-27-1931 (86 y.o. F) Treating RN: Montey Hora Primary Care Provider: Harrel Lemon Other Clinician: Referring Provider: Harrel Lemon Treating Provider/Extender: Melburn Hake, Andres Bantz Weeks in Treatment: 31 Constitutional Well-nourished and well-hydrated in no acute distress. Respiratory normal breathing without difficulty.  clear to auscultation bilaterally. Cardiovascular regular rate and rhythm with normal S1, S2. Psychiatric this patient is able to make decisions and demonstrates good insight into disease process. Alert and Oriented x 3. pleasant and cooperative. Notes My suggestion currently was that no sharp debridement was really necessary I feel like she's done very well with the Medihoney at this time. There is evidence of excellent granulation and though she did have some Slough I was able to remove this with a sterile Q-tip. Electronic Signature(s) Signed: 03/28/2018 11:59:00 AM By: Worthy Keeler PA-C Entered By: Worthy Keeler on 03/28/2018 11:38:42 Gardin, Gorgeous Loletha Grayer ([id number]) -------------------------------------------------------------------------------- Physician Orders Details Patient Name: Christner, Breunna C. Date of Service: 03/28/2018 10:30 AM Medical Record Number: [id number] Patient Account Number: [id number] Date of Birth/Sex: 1931-09-13 (86 y.o. F) Treating RN: Montey Hora Primary Care Provider: Harrel Lemon Other Clinician: Referring Provider: Harrel Lemon Treating Provider/Extender: Melburn Hake, Ace Bergfeld Weeks in Treatment: 10 Verbal / Phone Orders: No Diagnosis Coding ICD-10 Coding Code Description [phone] Non-pressure chronic ulcer of other part of right lower leg with fat layer exposed L97.822 Non-pressure chronic ulcer of other part of left lower leg with fat layer exposed S81.802A Unspecified open wound, left lower leg, initial encounter S81.801A Unspecified open wound, right lower leg, initial encounter I48.0 Paroxysmal atrial fibrillation Z79.01 Long term (current) use of anticoagulants N18.3 Chronic kidney disease, stage 3 (moderate) Wound Cleansing Wound #1 Right,Anterior Lower Leg o May Shower, gently pat wound dry prior to applying new dressing. - Clean with soap and water Primary Wound Dressing Wound #1 Right,Anterior Lower Leg o Medihoney gel - Santyl in  clinic Secondary Dressing Wound #1 Right,Anterior Lower Leg o  Conform/Kerlix - secure with netting o Non-adherent pad Dressing Change Frequency Wound #1 Right,Anterior Lower Leg o Change dressing every day. Follow-up Appointments Wound #1 Right,Anterior Lower Leg o Return Appointment in 1 week. Edema Control Wound #1 Right,Anterior Lower Leg o Elevate legs to the level of the heart and pump ankles as often as possible Electronic Signature(s) Signed: 03/28/2018 11:59:00 AM By: Worthy Keeler PA-C Signed: 03/28/2018 3:50:35 PM By: Adam Phenix, Tanna Savoy ([id number]) Entered By: Montey Hora on 03/28/2018 11:10:07 Zeien, Yamilett C. ([id number]) -------------------------------------------------------------------------------- Problem List Details Patient Name: Elizabeth Fields, [name] Date of Service: 03/28/2018 10:30 AM Medical Record Number: [id number] Patient Account Number: [id number] Date of Birth/Sex: [birth year] (86 y.o. F) Treating RN: Montey Hora Primary Care Provider: Harrel Lemon Other Clinician: Referring Provider: Harrel Lemon Treating Provider/Extender: Melburn Hake, Elbert Polyakov Weeks in Treatment: 10 Active Problems ICD-10 Evaluated Encounter Code Description Active Date Today Diagnosis L97.812 Non-pressure chronic ulcer of other part of right lower leg 01/17/2018 No Yes with fat layer exposed L97.822 Non-pressure chronic ulcer of other part of left lower leg with 01/17/2018 No Yes fat layer exposed S81.802A Unspecified open wound, left lower leg, initial encounter 01/17/2018 No Yes S81.801A Unspecified open wound, right lower leg, initial encounter 01/17/2018 No Yes I48.0 Paroxysmal atrial fibrillation 01/17/2018 No Yes Z79.01 Long term (current) use of anticoagulants 01/17/2018 No Yes N18.3 Chronic kidney disease, stage 3 (moderate) 01/17/2018 No Yes Inactive Problems Resolved Problems Electronic Signature(s) Signed: 03/28/2018 11:59:00 AM By: Worthy Keeler  PA-C Entered By: Worthy Keeler on 03/28/2018 11:06:07 Maston, Supriya C. ([id number]) -------------------------------------------------------------------------------- Progress Note Details Patient Name: Elizabeth Fields, [name] Date of Service: 03/28/2018 10:30 AM Medical Record Number: [id number] Patient Account Number: [id number] Date of Birth/Sex: [birth year] (86 y.o. F) Treating RN: Montey Hora Primary Care Provider: Harrel Lemon Other Clinician: Referring Provider: Harrel Lemon Treating Provider/Extender: Melburn Hake, Forever Arechiga Weeks in Treatment: 10 Subjective Chief Complaint Information obtained from Patient Bilateral LE Ulcers History of Present Illness (HPI) 01/17/18 on evaluation today patient presents for evaluation of two ulcers both on the anterior portion of her lower extremities one right and one left. The right she has had for about three weeks the left for about eight weeks and both occurred as a result of her traumatizing her shins on the bottom of the card were trying to get out of the car. She states this is never happen with any other car doors before but she has been having some issues with this sharp area on the store in particular. Both occurred in the similar situation where she was trying not to open the door the whole way in order to avoid damaging anybody else's car beside them. She is been using peroxide initially to clean the wound although she has converted to the wound cleanser now. She has been using if anything just over the counter triple antibiotic ointment and a Band-Aid. With that being said she's not even been using that more recently. The most part she's just been attempting allow this to dry out/scab over. Her APIs were greater than 220 and noncompressible. Patient does have a history of paroxysmal atrial fibrillation for which she is on Coumadin. She also has chronic kidney disease stage III. She has been on antibiotics that I see no evidence of infection right  now which is good news. 01/24/18 on evaluation today patient appears to be doing excellent in regard to her lower extremity ulcers. She has been tolerating the dressing changes without complication which is excellent news.  Overall I have been very pleased with her progress. Nonetheless she still has have some ways to go before this will be completely healed. The left is a little bit better in appearance compared to the right which is somewhat deeper. 01/31/18 on evaluation today patient appears to be doing better in regard to her bilateral anterior lower extremity ulcers. She has been tolerating the dressing changes without complication which is excellent news. Fortunately there does not appear to be any evidence of infection at this time which is also good news. I'm very pleased with how things seem to be progressing at this point. The patient likewise is very happy and states she's not having the pain that she was having previous. 02/07/18 on evaluation today patient appears to be doing rather well in regard to her ulcers. The left especially seems to be making good progress in the right does have more slough and poor granulation tissue on the surface at this point. I think this is gonna require little bit more extensive debridement. Fortunately there's no signs of infection at this time. 02/14/18 on evaluation today patient appears to be doing better in the overall appearance of the wounds although she tells me she's been having more burning over the past week. There appears to be some adhesive irritation. That has me somewhat concerned as far as the reason for the burning is concerned. With that being said I do not see any signs of actual infection which is good news. No fevers, chills, nausea, or vomiting noted at this time. Overall there does not appear to be any sign that there is worsening to me and the only reason the right extremity wound is bigger is that I actually performed a  fairly significant debridement last week. That maybe some of the reason why she's had a little bit more pain as well. 02/21/18 on evaluation today patient appears to be doing poorly in regard to her lower extremity ulcers unfortunately. She continues to have discomfort despite the dressing switch I don't think the dressing is the issue I believe she may actually have an infection and that could be the problem that we're facing at this point. Fortunately there's no signs of systemic infection. No fevers, chills, nausea, or vomiting noted at this time. Unfortunately I'm just not seeing the progress that I would expect especially after cleaning the wounds off sufficiently. 02/28/18 on evaluation today patient appears to be doing a little better in regard to her bilateral anterior lower Trinity ulcer. She still has some inflammation surrounding the wound but not nearly as bad as last week. Her culture did come back showing Galvis, Keiasia C. ([id number]) positive for Pseudomonas as the organism likely responsible for the infection. For that reason I did stop the doxycycline sent in a prescription for Levaquin for her today. Other than that and pleased with how things seem to be progressing I do believe the alginate have been better for her. 03/07/18 on evaluation today patient actually appears to be doing very well in regard to her bilateral lower Trinity ulcer's. In fact she seems to be showing signs of excellent improvement I do believe that the Levaquin we prescribed has been of benefit for her the infection appears to be clearing quite nicely. I'm very pleased in that regard. Her pain is also much less severe compared to what it was previous. 03/14/18 on evaluation today patient actually appears to be doing much better in regard to her bilateral lower is from the ulcers. In fact  she is appearing to almost be if not completely healed in regard to left lower extremity. Parts of the right lower extremity this  is significantly improved although not completely healed. Fortunately there's no signs of active infection at this time I believe we may finally have got not under control which is excellent. 03/21/18 on evaluation today patient actually appears to be doing much better in regard to her bilateral lower extremity ulcer. The left ulcer still show signs of being covered with a dry eschar although it appears this is likely healed underneath I could not quite get this off easily and therefore I'm more apt to leave it in place until it starts to loosen up a little bit more than its own. In regard to the right it appears that this is doing much better there's more granulation as well as epithelialization noted and she's not having any pain like what she was previous. 03/28/18 on evaluation today patient actually appears to be doing very well in regard to her bilateral lower extremity ulcer's. In fact the left leg ulcer is completely healed at this point. The right leg ulcer is doing much better and show signs of excellent improvement although it's not healed yet. I do feel like she's making good progress. Patient History Information obtained from Patient. Family History Cancer - Mother, Diabetes - Siblings, Heart Disease - Father,Siblings, Hypertension - Siblings, Kidney Disease - Siblings, No family history of Hereditary Spherocytosis, Lung Disease, Seizures. Social History Never smoker, Marital Status - Married, Alcohol Use - Never, Drug Use - No History, Caffeine Use - Rarely. Medical History Eyes Denies history of Cataracts, Glaucoma, Optic Neuritis Ear/Nose/Mouth/Throat Denies history of Chronic sinus problems/congestion, Middle ear problems Hematologic/Lymphatic Denies history of Anemia, Hemophilia, Human Immunodeficiency Virus, Lymphedema, Sickle Cell Disease Respiratory Denies history of Aspiration, Asthma, Chronic Obstructive Pulmonary Disease (COPD), Pneumothorax, Sleep  Apnea, Tuberculosis Cardiovascular Patient has history of Congestive Heart Failure, Coronary Artery Disease Denies history of Angina, Arrhythmia, Deep Vein Thrombosis, Hypertension, Hypotension, Myocardial Infarction, Peripheral Arterial Disease, Peripheral Venous Disease, Phlebitis, Vasculitis Gastrointestinal Denies history of Cirrhosis , Colitis, Crohn s, Hepatitis A, Hepatitis B, Hepatitis C Endocrine Denies history of Type I Diabetes, Type II Diabetes Genitourinary Patient has history of End Stage Renal Disease Immunological Denies history of Lupus Erythematosus, Raynaud s, Scleroderma Integumentary (Skin) Denies history of History of Burn, History of pressure wounds Musculoskeletal Wageman, [place] C. ([id number]) Denies history of Gout, Rheumatoid Arthritis, Osteoarthritis, Osteomyelitis Neurologic Denies history of Dementia, Neuropathy, Quadriplegia, Paraplegia Oncologic Denies history of Received Chemotherapy, Received Radiation Psychiatric Denies history of Anorexia/bulimia, Confinement Anxiety Review of Systems (ROS) Constitutional Symptoms (General Health) Denies complaints or symptoms of Fever, Chills. Respiratory The patient has no complaints or symptoms. Cardiovascular The patient has no complaints or symptoms. Psychiatric The patient has no complaints or symptoms. Objective Constitutional Well-nourished and well-hydrated in no acute distress. Vitals Time Taken: 10:43 AM, Height: 61 in, Weight: 100 lbs, BMI: 18.9, Temperature: 98.3 F, Pulse: 83 bpm, Respiratory Rate: 16 breaths/min, Blood Pressure: 123/83 mmHg. Respiratory normal breathing without difficulty. clear to auscultation bilaterally. Cardiovascular regular rate and rhythm with normal S1, S2. Psychiatric this patient is able to make decisions and demonstrates good insight into disease process. Alert and Oriented x 3. pleasant and cooperative. General Notes: My suggestion currently was that no sharp  debridement was really necessary I feel like she's done very well with the Medihoney at this time. There is evidence of excellent granulation and though she did have some Slough I  was able to remove this with a sterile Q-tip. Integumentary (Hair, Skin) Wound #1 status is Open. Original cause of wound was Trauma. The wound is located on the Right,Anterior Lower Leg. The wound measures 0.9cm length x 0.5cm width x 0.1cm depth; 0.353cm^2 area and 0.035cm^3 volume. There is Fat Layer (Subcutaneous Tissue) Exposed exposed. There is no tunneling or undermining noted. There is a medium amount of serous drainage noted. The wound margin is flat and intact. There is medium (34-66%) red, pink granulation within the wound bed. There is a medium (34-66%) amount of necrotic tissue within the wound bed including Adherent Slough. The periwound skin appearance exhibited: Hemosiderin Staining. The periwound skin appearance did not exhibit: Callus, Crepitus, Excoriation, Induration, Rash, Scarring, Dry/Scaly, Maceration, Atrophie Blanche, Cyanosis, Ecchymosis, Mottled, Pallor, Rubor, Erythema. Periwound temperature was noted as No Abnormality. The periwound has tenderness on palpation. Stirling, Kember C. ([id number]) Wound #2 status is Healed - Epithelialized. Original cause of wound was Trauma. The wound is located on the Left,Anterior Lower Leg. The wound measures 0cm length x 0cm width x 0cm depth; 0cm^2 area and 0cm^3 volume. There is Fat Layer (Subcutaneous Tissue) Exposed exposed. There is no tunneling or undermining noted. There is a medium amount of serous drainage noted. The wound margin is flat and intact. There is no granulation within the wound bed. There is a large (67-100%) amount of necrotic tissue within the wound bed including Eschar. The periwound skin appearance exhibited: Hemosiderin Staining. The periwound skin appearance did not exhibit: Callus, Crepitus, Excoriation, Induration, Rash, Scarring,  Dry/Scaly, Maceration, Atrophie Blanche, Cyanosis, Ecchymosis, Mottled, Pallor, Rubor, Erythema. Periwound temperature was noted as No Abnormality. The periwound has tenderness on palpation. 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 There was a Chemical/Enzymatic/Mechanical debridement performed by STONE III, Orvie Caradine E., PA-C. With the following instrument(s): tongue blade after achieving pain control using Lidocaine 4% Topical Solution. Agent used was [employer]. A time out was conducted at 11:10, prior to the start of the procedure. There was no bleeding. The procedure was tolerated well with a pain level of 0 throughout and a pain level of 0 following the procedure. Post Debridement Measurements: 0.9cm length x 0.5cm width x 0.1cm depth; 0.035cm^3 volume. Character of Wound/Ulcer Post Debridement is improved. Post procedure Diagnosis Wound #1: Same as Pre-Procedure Plan Wound Cleansing: Wound #1 Right,Anterior Lower Leg: May Shower, gently pat wound dry prior to applying new dressing. - Clean with soap and water Primary Wound Dressing: Wound #1 Right,Anterior Lower Leg: Medihoney gel - Santyl in clinic Secondary Dressing: Wound #1 Right,Anterior Lower Leg: Bignell, Enda C. ([id number]) Conform/Kerlix - secure with netting Non-adherent pad Dressing Change Frequency: Wound #1 Right,Anterior Lower Leg: Change dressing every day. Follow-up Appointments: Wound #1 Right,Anterior Lower  Leg: Return Appointment in 1 week. Edema Control: Wound #1 Right,Anterior Lower Leg: Elevate legs to the level of the heart and pump ankles as often as possible At this point I'm gonna suggest we continue with the above wound care measures for the next week the patient is in agreement with plan. We will  subsequently see were things that at follow-up. If anything changes worsens meantime she will contact the office and let me know. Please see above for specific wound care orders. We will see patient for re-evaluation in 1 week(s) here in the clinic. If anything worsens or changes patient will contact our office for additional recommendations. Electronic Signature(s) Signed: 03/28/2018 11:59:00 AM By: Worthy Keeler PA-C Entered By: Worthy Keeler on 03/28/2018 11:39:26 Medaglia, Ivis CMarland Kitchen ([id number]) -------------------------------------------------------------------------------- ROS/PFSH Details Patient Name: Elizabeth Fields, [name] Date of Service: 03/28/2018 10:30 AM Medical Record Number: [id number] Patient Account Number: [id number] Date of Birth/Sex: [birth year] (86 y.o. F) Treating RN: Montey Hora Primary Care Provider: Harrel Lemon Other Clinician: Referring Provider: Harrel Lemon Treating Provider/Extender: Melburn Hake, Johonna Binette Weeks in Treatment: 10 Information Obtained From Patient Wound History Do you currently have one or more open woundso Yes How many open wounds do you currently haveo 2 Approximately how long have you had your woundso 8 weeks How have you been treating your wound(s) until nowo peroxide, neosporin and wound cleanser and bandage. Has your wound(s) ever healed and then re-openedo No Have you had any lab work done in the past montho No Have you tested positive for an antibiotic resistant organism No (MRSA, VRE)o Have you tested positive for osteomyelitis (bone infection)o No Have you had any tests for circulation on your legso No Constitutional Symptoms (General  Health) Complaints and Symptoms: Negative for: Fever; Chills Eyes Medical History: Negative for: Cataracts; Glaucoma; Optic Neuritis Ear/Nose/Mouth/Throat Medical History: Negative for: Chronic sinus problems/congestion; Middle ear problems Hematologic/Lymphatic Medical History: Negative for: Anemia; Hemophilia; Human Immunodeficiency Virus; Lymphedema; Sickle Cell Disease Respiratory Complaints and Symptoms: No Complaints or Symptoms Medical History: Negative for: Aspiration; Asthma; Chronic Obstructive Pulmonary Disease (COPD); Pneumothorax; Sleep Apnea; Tuberculosis Cardiovascular Complaints and Symptoms: No Complaints or Symptoms Elizabeth Fields, [name] ([id number]) Medical History: Positive for: Congestive Heart Failure; Coronary Artery Disease Negative for: Angina; Arrhythmia; Deep Vein Thrombosis; Hypertension; Hypotension; Myocardial Infarction; Peripheral Arterial Disease; Peripheral Venous Disease; Phlebitis; Vasculitis Gastrointestinal Medical History: Negative for: Cirrhosis ; Colitis; Crohnos; Hepatitis A; Hepatitis B; Hepatitis C Endocrine Medical History: Negative for: Type I Diabetes; Type II Diabetes Genitourinary Medical History: Positive for: End Stage Renal Disease Immunological Medical History: Negative for: Lupus Erythematosus; Raynaudos; Scleroderma Integumentary (Skin) Medical History: Negative for: History of Burn; History of pressure wounds Musculoskeletal Medical History: Negative for: Gout; Rheumatoid Arthritis; Osteoarthritis; Osteomyelitis Neurologic Medical History: Negative for: Dementia; Neuropathy; Quadriplegia; Paraplegia Oncologic Medical History: Negative for: Received Chemotherapy; Received Radiation Psychiatric Complaints and Symptoms: No Complaints or Symptoms Medical History: Negative for: Anorexia/bulimia; Confinement Anxiety Immunizations Pneumococcal Vaccine: Received Pneumococcal Vaccination: Yes Implantable Devices No  devices added Elizabeth Fields, [name] ([id number]) Family and Social History Cancer: Yes - Mother; Diabetes: Yes - Siblings; Heart Disease: Yes - Father,Siblings; Hereditary Spherocytosis: No; Hypertension: Yes - Siblings; Kidney Disease: Yes - Siblings; Lung Disease: No; Seizures: No; Never smoker; Marital Status - Married; Alcohol Use: Never; Drug Use: No History; Caffeine Use: Rarely; Financial Concerns: No; Food, Clothing or Shelter Needs: No; Support System Lacking: No; Transportation Concerns: No; Advanced Directives: Yes (Not Provided); Patient does not want information on Advanced Directives; Do not resuscitate: Yes (Not Provided); Living Will: Yes (Not Provided); Medical Power of Attorney: Yes (Not Provided) Physician Affirmation I have reviewed and agree with the above information. Electronic Signature(s) Signed: 03/28/2018 11:59:00 AM By: Worthy Keeler PA-C Signed: 03/28/2018 3:50:35 PM By: Montey Hora Entered By: Worthy Keeler on 03/28/2018 11:38:31 Elizabeth Fields, [name] ([id number]) --------------------------------------------------------------------------------  SuperBill Details Patient Name: Elizabeth Fields, [name] Date of Service: 03/28/2018 Medical Record Number: [id number] Patient Account Number: [id number] Date of Birth/Sex: [birth year] (85 y.o. F) Treating RN: Montey Hora Primary Care Provider: Harrel Lemon Other Clinician: Referring Provider: Harrel Lemon Treating Provider/Extender: Melburn Hake, Silvestre Mines Weeks in Treatment: 10 Diagnosis Coding ICD-10 Codes Code Description [phone] Non-pressure chronic ulcer of other part of right lower leg with fat layer exposed L97.822 Non-pressure chronic ulcer of other part of left lower leg with fat layer exposed S81.802A Unspecified open wound, left lower leg, initial encounter S81.801A Unspecified open wound, right lower leg, initial encounter I48.0 Paroxysmal atrial fibrillation Z79.01 Long term (current) use of anticoagulants N18.3 Chronic  kidney disease, stage 3 (moderate) Facility Procedures CPT4 Code: 25427062 Description: 37628 - DEBRIDE W/O ANES NON SELECT Modifier: Quantity: 1 Physician Procedures CPT4 Code Description: 3151761 60737 - WC PHYS LEVEL 4 - EST PT ICD-10 Diagnosis Description T06.269 Non-pressure chronic ulcer of other part of right lower leg w S85.462 Non-pressure chronic ulcer of other part of left lower leg wi S81.802A Unspecified  open wound, left lower leg, initial encounter S81.801A Unspecified open wound, right lower leg, initial encounter Modifier: ith fat layer expo th fat layer expos Quantity: 1 sed ed Electronic Signature(s) Signed: 03/28/2018 11:59:00 AM By: Worthy Keeler PA-C Entered By: Worthy Keeler on 03/28/2018 11:39:41

## 2018-03-29 NOTE — Progress Notes (Signed)
KHUSHBU, PIPPEN ([id number]) Visit Report for 03/28/2018 Arrival Information Details Patient Name: Elizabeth Fields, [name] Date of Service: 03/28/2018 10:30 AM Medical Record Number: [id number] Patient Account Number: [id number] Date of Birth/Sex: 28-Feb-1931 (86 y.o. F) Treating RN: Army Melia Primary Care Myna Freimark: Harrel Lemon Other Clinician: Referring Kelce Bouton: Harrel Lemon Treating Mellina Benison/Extender: Melburn Hake, HOYT Weeks in Treatment: 10 Visit Information History Since Last Visit Added or deleted any medications: No Patient Arrived: Ambulatory Any new allergies or adverse reactions: No Arrival Time: 10:43 Had a fall or experienced change in No Accompanied By: self activities of daily living that may affect Transfer Assistance: None risk of falls: Signs or symptoms of abuse/neglect since last visito No Hospitalized since last visit: No Implantable device outside of the clinic excluding No cellular tissue based products placed in the center since last visit: Has Dressing in Place as Prescribed: Yes Pain Present Now: No Electronic Signature(s) Signed: 03/28/2018 11:22:54 AM By: Army Melia Entered By: Army Melia on 03/28/2018 10:43:28 Erazo, Kathrin C. ([id number]) -------------------------------------------------------------------------------- Encounter Discharge Information Details Patient Name: Elizabeth Fields, [name] Date of Service: 03/28/2018 10:30 AM Medical Record Number: [id number] Patient Account Number: [id number] Date of Birth/Sex: 07-15-1931 (86 y.o. F) Treating RN: Cornell Barman Primary Care Urban [employer]: Harrel Lemon Other Clinician: Referring Curry Dulski: Harrel Lemon Treating Raghav Verrilli/Extender: Melburn Hake, HOYT Weeks in Treatment: 10 Encounter Discharge Information Items Post Procedure Vitals Discharge Condition: Stable Temperature (F): 98.3 Ambulatory Status: Ambulatory Pulse (bpm): 83 Discharge Destination: Home Respiratory Rate (breaths/min): 16 Transportation: Private  Auto Blood Pressure (mmHg): 123/83 Accompanied By: self Schedule Follow-up Appointment: Yes Clinical Summary of Care: Electronic Signature(s) Signed: 03/28/2018 11:46:34 AM By: Gretta Cool, BSN, RN, CWS, Kim RN, BSN Entered By: Gretta Cool, BSN, RN, CWS, Kim on 03/28/2018 11:46:33 Ingle, Tanna Savoy ([id number]) -------------------------------------------------------------------------------- Lower Extremity Assessment Details Patient Name: Elizabeth Fields, [name] Date of Service: 03/28/2018 10:30 AM Medical Record Number: [id number] Patient Account Number: [id number] Date of Birth/Sex: March 15, 1931 (86 y.o. F) Treating RN: Army Melia Primary Care Ireene Ballowe: Harrel Lemon Other Clinician: Referring Tharon Bomar: Harrel Lemon Treating Hrishikesh Hoeg/Extender: Melburn Hake, HOYT Weeks in Treatment: 10 Edema Assessment Assessed: [Left: No] [Right: No] Edema: [Left: No] [Right: No] Electronic Signature(s) Signed: 03/28/2018 11:22:54 AM By: Army Melia Entered By: Army Melia on 03/28/2018 10:49:10 Lorio, Lilya C. ([id number]) -------------------------------------------------------------------------------- Multi Wound Chart Details Patient Name: Elizabeth Fields, [name] Date of Service: 03/28/2018 10:30 AM Medical Record Number: [id number] Patient Account Number: [id number] Date of Birth/Sex: 03/02/1931 (86 y.o. F) Treating RN: Montey Hora Primary Care Mechelle Pates: Harrel Lemon Other Clinician: Referring Landan Fedie: Harrel Lemon Treating Artia Singley/Extender: Melburn Hake, HOYT Weeks in Treatment: 10 Vital Signs Height(in): 61 Pulse(bpm): 83 Weight(lbs): 100 Blood Pressure(mmHg): 123/83 Body Mass Index(BMI): 19 Temperature(F): 98.3 Respiratory Rate 16 (breaths/min): Photos: [2:No Photos] [N/A:N/A] Wound Location: Right Lower Leg - Anterior Left, Anterior Lower Leg N/A Wounding Event: Trauma Trauma N/A Primary Etiology: Trauma, Other Trauma, Other N/A Comorbid History: Congestive Heart Failure, Congestive Heart Failure,  N/A Coronary Artery Disease, End Coronary Artery Disease, End Stage Renal Disease Stage Renal Disease Date Acquired: 12/26/2017 11/17/2017 N/A Weeks of Treatment: 10 10 N/A Wound Status: Open Healed - Epithelialized N/A Measurements L x W x D 0.9x0.5x0.1 0x0x0 N/A (cm) Area (cm) : 0.353 0 N/A Volume (cm) : 0.035 0 N/A % Reduction in Area: 86.40% 100.00% N/A % Reduction in Volume: 86.50% 100.00% N/A Classification: Full Thickness Without Full Thickness Without N/A Exposed Support Structures Exposed Support Structures Exudate Amount: Medium Medium N/A Exudate Type: Serous Serous N/A Exudate Color: amber amber N/A  Wound Margin: Flat and Intact Flat and Intact N/A Granulation Amount: Medium (34-66%) None Present (0%) N/A Granulation Quality: Red, Pink N/A N/A Necrotic Amount: Medium (34-66%) Large (67-100%) N/A Necrotic Tissue: Adherent Slough Eschar N/A Exposed Structures: Fat Layer (Subcutaneous Fat Layer (Subcutaneous N/A Tissue) Exposed: Yes Tissue) Exposed: Yes Fascia: No Fascia: No Tendon: No Tendon: No Branca, Maryan C. ([id number]) Muscle: No Muscle: No Joint: No Joint: No Bone: No Bone: No Epithelialization: None None N/A Periwound Skin Texture: Excoriation: No Excoriation: No N/A Induration: No Induration: No Callus: No Callus: No Crepitus: No Crepitus: No Rash: No Rash: No Scarring: No Scarring: No Periwound Skin Moisture: Maceration: No Maceration: No N/A Dry/Scaly: No Dry/Scaly: No Periwound Skin Color: Hemosiderin Staining: Yes Hemosiderin Staining: Yes N/A Atrophie Blanche: No Atrophie Blanche: No Cyanosis: No Cyanosis: No Ecchymosis: No Ecchymosis: No Erythema: No Erythema: No Mottled: No Mottled: No Pallor: No Pallor: No Rubor: No Rubor: No Temperature: No Abnormality No Abnormality N/A Tenderness on Palpation: Yes Yes N/A Wound Preparation: Ulcer Cleansing: Ulcer Cleansing: N/A Rinsed/Irrigated with Saline Rinsed/Irrigated  with Saline Topical Anesthetic Applied: Topical Anesthetic Applied: Other: lidocaine 4% Other: lidocaine 4% Treatment Notes Electronic Signature(s) Signed: 03/28/2018 3:50:35 PM By: Montey Hora Entered By: Montey Hora on 03/28/2018 11:09:08 Kozinski, Alexyss C. ([id number]) -------------------------------------------------------------------------------- [place] Details Patient Name: Elizabeth Fields, [name] Date of Service: 03/28/2018 10:30 AM Medical Record Number: [id number] Patient Account Number: [id number] Date of Birth/Sex: Oct 13, 1931 (86 y.o. F) Treating RN: Montey Hora Primary Care Corianna Avallone: Harrel Lemon Other Clinician: Referring Doryan Bahl: Harrel Lemon Treating Florinda Taflinger/Extender: Melburn Hake, HOYT Weeks in Treatment: 10 Active Inactive Abuse / Safety / Falls / Self Care Management Nursing Diagnoses: Potential for falls Goals: Patient will not experience any injury related to falls Date Initiated: 01/17/2018 Target Resolution Date: 04/12/2018 Goal Status: Active Interventions: Assess fall risk on admission and as needed Notes: Orientation to the Wound Care Program Nursing Diagnoses: Knowledge deficit related to the wound healing center program Goals: Patient/caregiver will verbalize understanding of the [place] [facility] Date Initiated: 01/17/2018 Target Resolution Date: 04/12/2018 Goal Status: Active Interventions: Provide education on orientation to the wound center Notes: Wound/Skin Impairment Nursing Diagnoses: Impaired tissue integrity Goals: Ulcer/skin breakdown will heal within 14 weeks Date Initiated: 01/17/2018 Target Resolution Date: 04/12/2018 Goal Status: Active Interventions: Assess patient/caregiver ability to obtain necessary supplies Smigelski, Kamy C. ([id number]) Assess patient/caregiver ability to perform ulcer/skin care regimen upon admission and as needed Assess ulceration(s) every visit Notes: Electronic  Signature(s) Signed: 03/28/2018 3:50:35 PM By: Montey Hora Entered By: Montey Hora on 03/28/2018 11:09:02 Beattie, Ori C. ([id number]) -------------------------------------------------------------------------------- Pain Assessment Details Patient Name: Elizabeth Fields, [name] Date of Service: 03/28/2018 10:30 AM Medical Record Number: [id number] Patient Account Number: [id number] Date of Birth/Sex: Jul 05, 1931 (86 y.o. F) Treating RN: Army Melia Primary Care Labrina Lines: Harrel Lemon Other Clinician: Referring Azile Minardi: Harrel Lemon Treating Elizabth Palka/Extender: Melburn Hake, HOYT Weeks in Treatment: 10 Active Problems Location of Pain Severity and Description of Pain Patient Has Paino No Site Locations Pain Management and Medication Current Pain Management: Electronic Signature(s) Signed: 03/28/2018 11:22:54 AM By: Army Melia Entered By: Army Melia on 03/28/2018 10:43:36 Goracke, Emara Loletha Grayer ([id number]) -------------------------------------------------------------------------------- Patient/Caregiver Education Details Patient Name: Elizabeth Fields, [name] Date of Service: 03/28/2018 10:30 AM Medical Record Number: [id number] Patient Account Number: [id number] Date of Birth/Gender: Jan 10, 1931 (85 y.o. F) Treating RN: Montey Hora Primary Care Physician: Harrel Lemon Other Clinician: Referring Physician: Harrel Lemon Treating Physician/Extender: Sharalyn Ink in Treatment: 10 Education Assessment Education Provided To:  Patient Education Topics Provided Wound/Skin Impairment: Handouts: Other: wound care as ordered Methods: Demonstration, Explain/Verbal Responses: State content correctly Electronic Signature(s) Signed: 03/28/2018 3:50:35 PM By: Montey Hora Entered By: Montey Hora on 03/28/2018 11:11:56 Elizabeth Fields, [name] ([id number]) -------------------------------------------------------------------------------- Wound Assessment Details Patient Name: Elizabeth Fields, [name] Date of  Service: 03/28/2018 10:30 AM Medical Record Number: [id number] Patient Account Number: [id number] Date of Birth/Sex: Jun 05, 1931 (86 y.o. F) Treating RN: Army Melia Primary Care Eloyse Causey: Harrel Lemon Other Clinician: Referring Hendryx Ricke: Harrel Lemon Treating Forest Redwine/Extender: Melburn Hake, HOYT Weeks in Treatment: 10 Wound Status Wound Number: 1 Primary Trauma, Other Etiology: Wound Location: Right Lower Leg - Anterior Wound Open Wounding Event: Trauma Status: Date Acquired: 12/26/2017 Comorbid Congestive Heart Failure, Coronary Artery Weeks Of Treatment: 10 History: Disease, End Stage Renal Disease Clustered Wound: No Photos Photo Uploaded By: Army Melia on 03/28/2018 11:06:52 Wound Measurements Length: (cm) 0.9 Width: (cm) 0.5 Depth: (cm) 0.1 Area: (cm) 0.353 Volume: (cm) 0.035 % Reduction in Area: 86.4% % Reduction in Volume: 86.5% Epithelialization: None Tunneling: No Undermining: No Wound Description Full Thickness Without Exposed Support Classification: Structures Wound Margin: Flat and Intact Exudate Medium Amount: Exudate Type: Serous Exudate Color: amber Foul Odor After Cleansing: No Slough/Fibrino Yes Wound Bed Granulation Amount: Medium (34-66%) Exposed Structure Granulation Quality: Red, Pink Fascia Exposed: No Necrotic Amount: Medium (34-66%) Fat Layer (Subcutaneous Tissue) Exposed: Yes Necrotic Quality: Adherent Slough Tendon Exposed: No Muscle Exposed: No Joint Exposed: No Bone Exposed: No Tarr, Jolette C. ([id number]) Periwound Skin Texture Texture Color No Abnormalities Noted: No No Abnormalities Noted: No Callus: No Atrophie Blanche: No Crepitus: No Cyanosis: No Excoriation: No Ecchymosis: No Induration: No Erythema: No Rash: No Hemosiderin Staining: Yes Scarring: No Mottled: No Pallor: No Moisture Rubor: No No Abnormalities Noted: No Dry / Scaly: No Temperature / Pain Maceration: No Temperature: No  Abnormality Tenderness on Palpation: Yes Wound Preparation Ulcer Cleansing: Rinsed/Irrigated with Saline Topical Anesthetic Applied: Other: lidocaine 4%, Treatment Notes Wound #1 (Right, Anterior Lower Leg) Notes santyl, telfa, conform on right, Electronic Signature(s) Signed: 03/28/2018 11:22:54 AM By: Army Melia Entered By: Army Melia on 03/28/2018 10:48:45 Elizabeth Fields, [name] ([id number]) -------------------------------------------------------------------------------- Wound Assessment Details Patient Name: Elizabeth Fields, [name] Date of Service: 03/28/2018 10:30 AM Medical Record Number: [id number] Patient Account Number: [id number] Date of Birth/Sex: 1931-03-04 (86 y.o. F) Treating RN: Montey Hora Primary Care Kadience Macchi: Harrel Lemon Other Clinician: Referring Masiyah Jorstad: Harrel Lemon Treating Dannis Deroche/Extender: Melburn Hake, HOYT Weeks in Treatment: 10 Wound Status Wound Number: 2 Primary Trauma, Other Etiology: Wound Location: Left, Anterior Lower Leg Wound Healed - Epithelialized Wounding Event: Trauma Status: Date Acquired: 11/17/2017 Comorbid Congestive Heart Failure, Coronary Artery Weeks Of Treatment: 10 History: Disease, End Stage Renal Disease Clustered Wound: No Wound Measurements Length: (cm) 0 % Reduc Width: (cm) 0 % Reduc Depth: (cm) 0 Epithel Area: (cm) 0 Tunnel Volume: (cm) 0 Underm tion in Area: 100% tion in Volume: 100% ialization: None ing: No ining: No Wound Description Full Thickness Without Exposed Support Classification: Structures Wound Margin: Flat and Intact Exudate Medium Amount: Exudate Type: Serous Exudate Color: amber Foul Odor After Cleansing: No Slough/Fibrino Yes Wound Bed Granulation Amount: None Present (0%) Exposed Structure Necrotic Amount: Large (67-100%) Fascia Exposed: No Necrotic Quality: Eschar Fat Layer (Subcutaneous Tissue) Exposed: Yes Tendon Exposed: No Muscle Exposed: No Joint Exposed: No Bone Exposed:  No Periwound Skin Texture Texture Color No Abnormalities Noted: No No Abnormalities Noted: No Callus: No Atrophie Blanche: No Crepitus: No Cyanosis: No Excoriation: No Ecchymosis: No Induration: No  Erythema: No Rash: No Hemosiderin Staining: Yes Scarring: No Mottled: No Pallor: No Moisture Rubor: No No Abnormalities Noted: No Dry / Scaly: No Temperature / Pain Elizabeth Fields, [name] ([id number]) Maceration: No Temperature: No Abnormality Tenderness on Palpation: Yes Wound Preparation Ulcer Cleansing: Rinsed/Irrigated with Saline Topical Anesthetic Applied: Other: lidocaine 4%, Electronic Signature(s) Signed: 03/28/2018 3:50:35 PM By: Montey Hora Entered By: Montey Hora on 03/28/2018 11:08:47 Elizabeth Fields, [name] ([id number]) -------------------------------------------------------------------------------- Vitals Details Patient Name: Elizabeth Fields, Timya C. Date of Service: 03/28/2018 10:30 AM Medical Record Number: [id number] Patient Account Number: [id number] Date of Birth/Sex: [birth year] (86 y.o. F) Treating RN: Army Melia Primary Care Aldon Hengst: Harrel Lemon Other Clinician: Referring Yitzel Shasteen: Harrel Lemon Treating Lurena Naeve/Extender: Melburn Hake, HOYT Weeks in Treatment: 10 Vital Signs Time Taken: 10:43 Temperature (F): 98.3 Height (in): 61 Pulse (bpm): 83 Weight (lbs): 100 Respiratory Rate (breaths/min): 16 Body Mass Index (BMI): 18.9 Blood Pressure (mmHg): 123/83 Reference Range: 80 - 120 mg / dl Electronic Signature(s) Signed: 03/28/2018 11:22:54 AM By: Army Melia Entered By: Army Melia on 03/28/2018 10:44:03

## 2018-03-31 DIAGNOSIS — I482 Chronic atrial fibrillation, unspecified: Secondary | ICD-10-CM | POA: Diagnosis not present

## 2018-04-02 DIAGNOSIS — S81802A Unspecified open wound, left lower leg, initial encounter: Secondary | ICD-10-CM | POA: Diagnosis not present

## 2018-04-02 DIAGNOSIS — Z7901 Long term (current) use of anticoagulants: Secondary | ICD-10-CM | POA: Diagnosis not present

## 2018-04-02 DIAGNOSIS — L97812 Non-pressure chronic ulcer of other part of right lower leg with fat layer exposed: Secondary | ICD-10-CM | POA: Diagnosis not present

## 2018-04-02 DIAGNOSIS — N183 Chronic kidney disease, stage 3 (moderate): Secondary | ICD-10-CM | POA: Diagnosis not present

## 2018-04-02 DIAGNOSIS — L97822 Non-pressure chronic ulcer of other part of left lower leg with fat layer exposed: Secondary | ICD-10-CM | POA: Diagnosis not present

## 2018-04-02 DIAGNOSIS — S81801A Unspecified open wound, right lower leg, initial encounter: Secondary | ICD-10-CM | POA: Diagnosis not present

## 2018-04-02 DIAGNOSIS — I480 Paroxysmal atrial fibrillation: Secondary | ICD-10-CM | POA: Diagnosis not present

## 2018-04-04 ENCOUNTER — Encounter: Payer: PPO | Admitting: Physician Assistant

## 2018-04-04 ENCOUNTER — Other Ambulatory Visit: Payer: Self-pay

## 2018-04-04 DIAGNOSIS — L97812 Non-pressure chronic ulcer of other part of right lower leg with fat layer exposed: Secondary | ICD-10-CM | POA: Diagnosis not present

## 2018-04-04 DIAGNOSIS — E11622 Type 2 diabetes mellitus with other skin ulcer: Secondary | ICD-10-CM | POA: Diagnosis not present

## 2018-04-05 NOTE — Progress Notes (Signed)
Elizabeth [name] ([id number]) Visit Report for 04/04/2018 Arrival Information Details Patient Name: [name], [name] Date of Service: 04/04/2018 10:00 AM Medical Record Number: [id number] Patient Account Number: [id number] Date of Birth/Sex: Apr 17, 1931 (85 y.o. F) Treating RN: Elizabeth [name] Primary Care Elizabeth [name]: Elizabeth [name] Other Clinician: Referring Elizabeth [name]: Elizabeth [name] Treating Elizabeth [name]/Extender: Elizabeth [name] Weeks in Treatment: 11 Visit Information History Since Last Visit Added or deleted any medications: No Patient Arrived: Ambulatory Any new allergies or adverse reactions: No Arrival Time: 10:10 Had a fall or experienced change in No Accompanied By: self activities of daily living that may affect Transfer Assistance: None risk of falls: Patient Identification Verified: Yes Signs or symptoms of abuse/neglect since last visito No Secondary Verification Process Completed: Yes Hospitalized since last visit: No Has Dressing in Place as Prescribed: Yes Pain Present Now: No Electronic Signature(s) Signed: 04/04/2018 3:52:12 PM By: Elizabeth [name] Entered By: Elizabeth [name] on 04/04/2018 10:10:25 [name], [name] ([id number]) -------------------------------------------------------------------------------- Clinic Level of Care Assessment Details Patient Name: [name], [name] Date of Service: 04/04/2018 10:00 AM Medical Record Number: [id number] Patient Account Number: [id number] Date of Birth/Sex: 10-Jan-1931 (85 y.o. F) Treating RN: Elizabeth [name] Primary Care Elizabeth [name]: Elizabeth [name] Other Clinician: Referring Elizabeth [name]: Elizabeth [name] Treating Elizabeth [name]/Extender: Elizabeth [name] Weeks in Treatment: 11 Clinic Level of Care Assessment Items TOOL 4 Quantity Score []  - Use when only an EandM is performed on FOLLOW-UP visit 0 ASSESSMENTS - Nursing Assessment / Reassessment X - Reassessment of Co-morbidities (includes updates in patient status) 1 10 X- 1 5 Reassessment of  Adherence to Treatment Plan ASSESSMENTS - Wound and Skin Assessment / Reassessment X - Simple Wound Assessment / Reassessment - one wound 1 5 []  - 0 Complex Wound Assessment / Reassessment - multiple wounds []  - 0 Dermatologic / Skin Assessment (not related to wound area) ASSESSMENTS - Focused Assessment []  - Circumferential Edema Measurements - multi extremities 0 []  - 0 Nutritional Assessment / Counseling / Intervention X- 1 5 Lower Extremity Assessment (monofilament, tuning fork, pulses) []  - 0 Peripheral Arterial Disease Assessment (using hand held doppler) ASSESSMENTS - Ostomy and/or Continence Assessment and Care []  - Incontinence Assessment and Management 0 []  - 0 Ostomy Care Assessment and Management (repouching, etc.) PROCESS - Coordination of Care X - Simple Patient / Family Education for ongoing care 1 15 []  - 0 Complex (extensive) Patient / Family Education for ongoing care X- 1 10 Staff obtains Programmer, systems, Records, Test Results / Process Orders []  - 0 Staff telephones HHA, Nursing Homes / Clarify orders / etc []  - 0 Routine Transfer to another Facility (non-emergent condition) []  - 0 Routine Hospital Admission (non-emergent condition) []  - 0 New Admissions / Biomedical engineer / Ordering NPWT, Apligraf, etc. []  - 0 Emergency Hospital Admission (emergent condition) X- 1 10 Simple Discharge Coordination [name], [name] ([id number]) []  - 0 Complex (extensive) Discharge Coordination PROCESS - Special Needs []  - Pediatric / Minor Patient Management 0 []  - 0 Isolation Patient Management []  - 0 Hearing / Language / Visual special needs []  - 0 Assessment of Community assistance (transportation, D/C planning, etc.) []  - 0 Additional assistance / Altered mentation []  - 0 Support Surface(s) Assessment (bed, cushion, seat, etc.) INTERVENTIONS - Wound Cleansing / Measurement X - Simple Wound Cleansing - one wound 1 5 []  - 0 Complex Wound Cleansing - multiple  wounds X- 1 5 Wound Imaging (photographs - any number of wounds) []  - 0 Wound Tracing (instead of photographs) X- 1 5 Simple Wound  Measurement - one wound []  - 0 Complex Wound Measurement - multiple wounds INTERVENTIONS - Wound Dressings X - Small Wound Dressing one or multiple wounds 1 10 []  - 0 Medium Wound Dressing one or multiple wounds []  - 0 Large Wound Dressing one or multiple wounds []  - 0 Application of Medications - topical []  - 0 Application of Medications - injection INTERVENTIONS - Miscellaneous []  - External ear exam 0 []  - 0 Specimen Collection (cultures, biopsies, blood, body fluids, etc.) []  - 0 Specimen(s) / Culture(s) sent or taken to Lab for analysis []  - 0 Patient Transfer (multiple staff / Civil Service fast streamer / Similar devices) []  - 0 Simple Staple / Suture removal (25 or less) []  - 0 Complex Staple / Suture removal (26 or more) []  - 0 Hypo / Hyperglycemic Management (close monitor of Blood Glucose) []  - 0 Ankle / Brachial Index (ABI) - do not check if billed separately X- 1 5 Vital Signs [name], [name] ([id number]) Has the patient been seen at the hospital within the last three years: Yes Total Score: 90 Level Of Care: New/Established - Level 3 Electronic Signature(s) Signed: 04/04/2018 4:37:01 PM By: Elizabeth [name] Entered By: Elizabeth [name] on 04/04/2018 10:51:47 [name], [name] ([id number]) -------------------------------------------------------------------------------- Encounter Discharge Information Details Patient Name: [name], [name] Date of Service: 04/04/2018 10:00 AM Medical Record Number: [id number] Patient Account Number: [id number] Date of Birth/Sex: 1931-02-01 (85 y.o. F) Treating RN: Elizabeth [name] Primary Care Caylin Raby: Elizabeth [name] Other Clinician: Referring Jaiyon Wander: Elizabeth [name] Treating Eboni Coval/Extender: Elizabeth [name] Weeks in Treatment: 11 Encounter Discharge Information Items Discharge Condition: Stable Ambulatory Status:  Ambulatory Discharge Destination: Home Transportation: Private Auto Accompanied By: self Schedule Follow-up Appointment: Yes Clinical Summary of Care: Electronic Signature(s) Signed: 04/04/2018 11:50:38 AM By: Elizabeth [name] Entered By: Elizabeth [name] on 04/04/2018 11:50:38 Ciavarella, Clotee C. ([id number]) -------------------------------------------------------------------------------- Lower Extremity Assessment Details Patient Name: Skibicki, Leonard C. Date of Service: 04/04/2018 10:00 AM Medical Record Number: [id number] Patient Account Number: [id number] Date of Birth/Sex: 12-02-1931 (86 y.o. F) Treating RN: Army Melia Primary Care Archie Shea: Elizabeth [name] Other Clinician: Referring Ayman Brull: Elizabeth [name] Treating Nayan Proch/Extender: Elizabeth [name] Weeks in Treatment: 11 Edema Assessment Assessed: [Left: No] [Right: No] Edema: [Left: N] [Right: o] Vascular Assessment Pulses: Dorsalis Pedis Palpable: [Right:Yes] Extremity colors, hair growth, and conditions: Extremity Color: [Right:Hyperpigmented] Hair Growth on Extremity: [Right:No] Temperature of Extremity: [Right:Warm < 3 seconds] Toe Nail Assessment Left: Right: Thick: No Discolored: No Deformed: No Improper Length and Hygiene: No Electronic Signature(s) Signed: 04/04/2018 11:52:41 AM By: Army Melia Entered By: Army Melia on 04/04/2018 10:16:44 [name], [name] ([id number]) -------------------------------------------------------------------------------- Multi Wound Chart Details Patient Name: [name], [name] Date of Service: 04/04/2018 10:00 AM Medical Record Number: [id number] Patient Account Number: [id number] Date of Birth/Sex: 03-20-1931 (86 y.o. F) Treating RN: Elizabeth [name] Primary Care Dakai Braithwaite: Elizabeth [name] Other Clinician: Referring Franky Reier: Elizabeth [name] Treating Vandana Haman/Extender: Elizabeth [name] Weeks in Treatment: 11 Vital Signs Height(in): 61 Pulse(bpm): 79 Weight(lbs): 100 Blood Pressure(mmHg):  141/43 Body Mass Index(BMI): 19 Temperature(F): 97.7 Respiratory Rate 16 (breaths/min): Photos: [1:No Photos] [N/A:N/A] Wound Location: [1:Right, Anterior Lower Leg] [N/A:N/A] Wounding Event: [1:Trauma] [N/A:N/A] Primary Etiology: [1:Trauma, Other] [N/A:N/A] Comorbid History: [1:Congestive Heart Failure, Coronary Artery Disease, End Stage Renal Disease] [N/A:N/A] Date Acquired: [1:12/26/2017] [N/A:N/A] Weeks of Treatment: [1:11] [N/A:N/A] Wound Status: [1:Open] [N/A:N/A] Measurements L x W x D [1:0.5x0.5x0.2] [N/A:N/A] (cm) Area (cm) : [1:0.196] [N/A:N/A] Volume (cm) : [1:0.039] [N/A:N/A] % Reduction in Area: [1:92.40%] [N/A:N/A] % Reduction in Volume: [1:84.90%] [N/A:N/A]  Classification: [1:Full Thickness Without Exposed Support Structures] [N/A:N/A] Exudate Amount: [1:Medium] [N/A:N/A] Exudate Type: [1:Serous] [N/A:N/A] Exudate Color: [1:amber] [N/A:N/A] Wound Margin: [1:Flat and Intact] [N/A:N/A] Granulation Amount: [1:Medium (34-66%)] [N/A:N/A] Granulation Quality: [1:Red, Pink] [N/A:N/A] Necrotic Amount: [1:Medium (34-66%)] [N/A:N/A] Exposed Structures: [1:Fat Layer (Subcutaneous Tissue) Exposed: Yes Fascia: No Tendon: No Muscle: No Joint: No Bone: No] [N/A:N/A] Epithelialization: [1:None] [N/A:N/A] Periwound Skin Texture: [1:Excoriation: No Induration: No Callus: No Crepitus: No] [N/A:N/A] Rash: No Scarring: No Periwound Skin Moisture: Maceration: No N/A N/A Dry/Scaly: No Periwound Skin Color: Hemosiderin Staining: Yes N/A N/A Atrophie Blanche: No Cyanosis: No Ecchymosis: No Erythema: No Mottled: No Pallor: No Rubor: No Temperature: No Abnormality N/A N/A Tenderness on Palpation: Yes N/A N/A Wound Preparation: Ulcer Cleansing: N/A N/A Rinsed/Irrigated with Saline Topical Anesthetic Applied: Other: lidocaine 4% Treatment Notes Electronic Signature(s) Signed: 04/04/2018 4:37:01 PM By: Elizabeth [name] Entered By: Elizabeth [name] on 04/04/2018  10:49:46 [name], [name] ([id number]) -------------------------------------------------------------------------------- [place] Details Patient Name: [name], [name] Date of Service: 04/04/2018 10:00 AM Medical Record Number: [id number] Patient Account Number: [id number] Date of Birth/Sex: 1931/01/29 (86 y.o. F) Treating RN: Elizabeth [name] Primary Care Seraphina Mitchner: Elizabeth [name] Other Clinician: Referring Anaysha Andre: Elizabeth [name] Treating Janica Eldred/Extender: Elizabeth [name] Weeks in Treatment: 11 Active Inactive Abuse / Safety / Falls / Self Care Management Nursing Diagnoses: Potential for falls Goals: Patient will not experience any injury related to falls Date Initiated: 01/17/2018 Target Resolution Date: 04/12/2018 Goal Status: Active Interventions: Assess fall risk on admission and as needed Notes: Orientation to the Wound Care Program Nursing Diagnoses: Knowledge deficit related to the wound healing center program Goals: Patient/caregiver will verbalize understanding of the [place] Program Date Initiated: 01/17/2018 Target Resolution Date: 04/12/2018 Goal Status: Active Interventions: Provide education on orientation to the wound center Notes: Wound/Skin Impairment Nursing Diagnoses: Impaired tissue integrity Goals: Ulcer/skin breakdown will heal within 14 weeks Date Initiated: 01/17/2018 Target Resolution Date: 04/12/2018 Goal Status: Active Interventions: Assess patient/caregiver ability to obtain necessary supplies Angevine, Brylea C. ([id number]) Assess patient/caregiver ability to perform ulcer/skin care regimen upon admission and as needed Assess ulceration(s) every visit Notes: Electronic Signature(s) Signed: 04/04/2018 4:37:01 PM By: Elizabeth [name] Entered By: Elizabeth [name] on 04/04/2018 10:49:39 [name], [name] ([id number]) -------------------------------------------------------------------------------- Pain Assessment Details Patient Name:  [name], [name] Date of Service: 04/04/2018 10:00 AM Medical Record Number: [id number] Patient Account Number: [id number] Date of Birth/Sex: 15-Sep-1931 (86 y.o. F) Treating RN: Elizabeth [name] Primary Care Beula Joyner: Elizabeth [name] Other Clinician: Referring Chelsy Parrales: Elizabeth [name] Treating Ohn Bostic/Extender: Elizabeth [name] Weeks in Treatment: 11 Active Problems Location of Pain Severity and Description of Pain Patient Has Paino No Site Locations Pain Management and Medication Current Pain Management: Electronic Signature(s) Signed: 04/04/2018 3:52:12 PM By: Elizabeth [name] Entered By: Elizabeth [name] on 04/04/2018 10:11:02 Chenard, Elizabeth Loletha Grayer ([id number]) -------------------------------------------------------------------------------- Patient/Caregiver Education Details Patient Name: Valenta, Ekaterini C. Date of Service: 04/04/2018 10:00 AM Medical Record Number: [id number] Patient Account Number: [id number] Date of Birth/Gender: 06/03/1931 (86 y.o. F) Treating RN: Elizabeth [name] Primary Care Physician: Elizabeth [name] Other Clinician: Referring Physician: Harrel [name] Treating Physician/Extender: Sharalyn Ink in Treatment: 11 Education Assessment Education Provided To: Patient Education Topics Provided Wound/Skin Impairment: Handouts: Other: wound care as ordered Methods: Demonstration, Explain/Verbal Responses: State content correctly Electronic Signature(s) Signed: 04/04/2018 4:37:01 PM By: Elizabeth [name] Entered By: Elizabeth [name] on 04/04/2018 10:52:07 Coltrane, Etrulia C. ([id number]) -------------------------------------------------------------------------------- Wound Assessment Details Patient Name: Conteh, Desire C. Date of Service: 04/04/2018 10:00 AM Medical Record Number: [id number] Patient  Account Number: [id number] Date of Birth/Sex: 1931/09/21 (86 y.o. F) Treating RN: Elizabeth [name] Primary Care Graziella Connery: Elizabeth [name] Other Clinician: Referring Deann Mclaine: Elizabeth [name] Treating Dare Spillman/Extender: Elizabeth [name] Weeks in Treatment: 11 Wound Status Wound Number: 1 Primary Trauma, Other Etiology: Wound Location: Right, Anterior Lower Leg Wound Open Wounding Event: Trauma Status: Date Acquired: 12/26/2017 Comorbid Congestive Heart Failure, Coronary Artery Weeks Of Treatment: 11 History: Disease, End Stage Renal Disease Clustered Wound: No Photos Photo Uploaded By: Army Melia on 04/04/2018 11:25:35 Wound Measurements Length: (cm) 0.5 Width: (cm) 0.5 Depth: (cm) 0.2 Area: (cm) 0.196 Volume: (cm) 0.039 % Reduction in Area: 92.4% % Reduction in Volume: 84.9% Epithelialization: None Tunneling: No Undermining: No Wound Description Full Thickness Without Exposed Support Classification: Structures Wound Margin: Flat and Intact Exudate Medium Amount: Exudate Type: Serous Exudate Color: amber Foul Odor After Cleansing: No Slough/Fibrino Yes Wound Bed Granulation Amount: Medium (34-66%) Exposed Structure Granulation Quality: Red, Pink Fascia Exposed: No Necrotic Amount: Medium (34-66%) Fat Layer (Subcutaneous Tissue) Exposed: Yes Necrotic Quality: Adherent Slough Tendon Exposed: No Muscle Exposed: No Joint Exposed: No Bone Exposed: No Kellman, Journe C. ([id number]) Periwound Skin Texture Texture Color No Abnormalities Noted: No No Abnormalities Noted: No Callus: No Atrophie Blanche: No Crepitus: No Cyanosis: No Excoriation: No Ecchymosis: No Induration: No Erythema: No Rash: No Hemosiderin Staining: Yes Scarring: No Mottled: No Pallor: No Moisture Rubor: No No Abnormalities Noted: No Dry / Scaly: No Temperature / Pain Maceration: No Temperature: No Abnormality Tenderness on Palpation: Yes Wound Preparation Ulcer Cleansing: Rinsed/Irrigated with Saline Topical Anesthetic Applied: Other: lidocaine 4%, Treatment Notes Wound #1 (Right, Anterior Lower Leg) Notes Silver collagen, non-adherent, conform on  right, Electronic Signature(s) Signed: 04/04/2018 4:37:01 PM By: Elizabeth [name] Entered By: Elizabeth [name] on 04/04/2018 10:49:28 Luis, Darris C. ([id number]) -------------------------------------------------------------------------------- Vitals Details Patient Name: Mounts, Doreene C. Date of Service: 04/04/2018 10:00 AM Medical Record Number: [id number] Patient Account Number: [id number] Date of Birth/Sex: 1931-02-10 (86 y.o. F) Treating RN: Elizabeth [name] Primary Care Jaeden Westbay: Elizabeth [name] Other Clinician: Referring Jaeleen Inzunza: Elizabeth [name] Treating Brionna Romanek/Extender: Elizabeth [name] Weeks in Treatment: 11 Vital Signs Time Taken: 10:10 Temperature (F): 97.7 Height (in): 61 Pulse (bpm): 68 Weight (lbs): 100 Respiratory Rate (breaths/min): 16 Body Mass Index (BMI): 18.9 Blood Pressure (mmHg): 141/43 Reference Range: 80 - 120 mg / dl Electronic Signature(s) Signed: 04/04/2018 3:52:12 PM By: Elizabeth [name] Entered By: Elizabeth [name] on 04/04/2018 10:12:42

## 2018-04-05 NOTE — Progress Notes (Signed)
ENIYA, CANNADY ([id number]) Visit Report for 04/04/2018 Chief Complaint Document Details Patient Name: Elizabeth [name], [name] Date of Service: 04/04/2018 10:00 AM Medical Record Number: [id number] Patient Account Number: [id number] Date of Birth/Sex: 1931/02/17 (85 y.o. F) Treating RN: Montey Hora Primary Care Provider: Harrel Lemon Other Clinician: Referring Provider: Harrel Lemon Treating Provider/Extender: Melburn Hake, HOYT Weeks in Treatment: 11 Information Obtained from: Patient Chief Complaint Bilateral LE Ulcers Electronic Signature(s) Signed: 04/04/2018 4:16:42 PM By: Worthy Keeler PA-[name] Entered By: Worthy Keeler on 04/04/2018 10:27:03 Apps, [name] ([id number]) -------------------------------------------------------------------------------- HPI Details Patient Name: Elizabeth [name], [name] Date of Service: 04/04/2018 10:00 AM Medical Record Number: [id number] Patient Account Number: [id number] Date of Birth/Sex: Mar 27, 1931 (85 y.o. F) Treating RN: Montey Hora Primary Care Provider: Harrel Lemon Other Clinician: Referring Provider: Harrel Lemon Treating Provider/Extender: Melburn Hake, HOYT Weeks in Treatment: 11 History of Present Illness HPI Description: 01/17/18 on evaluation today patient presents for evaluation of two ulcers both on the anterior portion of her lower extremities one right and one left. The right she has had for about three weeks the left for about eight weeks and both occurred as a result of her traumatizing her shins on the bottom of the card were trying to get out of the car. She states this is never happen with any other car doors before but she has been having some issues with this sharp area on the store in particular. Both occurred in the similar situation where she was trying not to open the door the whole way in order to avoid damaging anybody else's car beside them. She is been using peroxide initially to clean the wound although she has converted to the  wound cleanser now. She has been using if anything just over the counter triple antibiotic ointment and a Band-Aid. With that being said she's not even been using that more recently. The most part she's just been attempting allow this to dry out/scab over. Her APIs were greater than 220 and noncompressible. Patient does have a history of paroxysmal atrial fibrillation for which she is on Coumadin. She also has chronic kidney disease stage III. She has been on antibiotics that I see no evidence of infection right now which is good news. 01/24/18 on evaluation today patient appears to be doing excellent in regard to her lower extremity ulcers. She has been tolerating the dressing changes without complication which is excellent news. Overall I have been very pleased with her progress. Nonetheless she still has have some ways to go before this will be completely healed. The left is a little bit better in appearance compared to the right which is somewhat deeper. 01/31/18 on evaluation today patient appears to be doing better in regard to her bilateral anterior lower extremity ulcers. She has been tolerating the dressing changes without complication which is excellent news. Fortunately there does not appear to be any evidence of infection at this time which is also good news. I'm very pleased with how things seem to be progressing at this point. The patient likewise is very happy and states she's not having the pain that she was having previous. 02/07/18 on evaluation today patient appears to be doing rather well in regard to her ulcers. The left especially seems to be making good progress in the right does have more slough and poor granulation tissue on the surface at this point. I think this is gonna require little bit more extensive debridement. Fortunately there's no signs of infection at this time. 02/14/18  on evaluation today patient appears to be doing better in the overall appearance of the wounds  although she tells me she's been having more burning over the past week. There appears to be some adhesive irritation. That has me somewhat concerned as far as the reason for the burning is concerned. With that being said I do not see any signs of actual infection which is good news. No fevers, chills, nausea, or vomiting noted at this time. Overall there does not appear to be any sign that there is worsening to me and the only reason the right extremity wound is bigger is that I actually performed a fairly significant debridement last week. That maybe some of the reason why she's had a little bit more pain as well. 02/21/18 on evaluation today patient appears to be doing poorly in regard to her lower extremity ulcers unfortunately. She continues to have discomfort despite the dressing switch I don't think the dressing is the issue I believe she may actually have an infection and that could be the problem that we're facing at this point. Fortunately there's no signs of systemic infection. No fevers, chills, nausea, or vomiting noted at this time. Unfortunately I'm just not seeing the progress that I would expect especially after cleaning the wounds off sufficiently. 02/28/18 on evaluation today patient appears to be doing a little better in regard to her bilateral anterior lower Trinity ulcer. She still has some inflammation surrounding the wound but not nearly as bad as last week. Her culture did come back showing positive for Pseudomonas as the organism likely responsible for the infection. For that reason I did stop the doxycycline sent in a prescription for Levaquin for her today. Other than that and pleased with how things seem to be progressing I do believe the alginate have been better for her. 03/07/18 on evaluation today patient actually appears to be doing very well in regard to her bilateral lower Trinity ulcer's. In fact she seems to be showing signs of excellent improvement I do believe  that the Levaquin we prescribed has been of benefit for [place]eter, [name] ([id number]) her the infection appears to be clearing quite nicely. I'm very pleased in that regard. Her pain is also much less severe compared to what it was previous. 03/14/18 on evaluation today patient actually appears to be doing much better in regard to her bilateral lower is from the ulcers. In fact she is appearing to almost be if not completely healed in regard to left lower extremity. Parts of the right lower extremity this is significantly improved although not completely healed. Fortunately there's no signs of active infection at this time I believe we may finally have got not under control which is excellent. 03/21/18 on evaluation today patient actually appears to be doing much better in regard to her bilateral lower extremity ulcer. The left ulcer still show signs of being covered with a dry eschar although it appears this is likely healed underneath I could not quite get this off easily and therefore I'm more apt to leave it in place until it starts to loosen up a little bit more than its own. In regard to the right it appears that this is doing much better there's more granulation as well as epithelialization noted and she's not having any pain like what she was previous. 03/28/18 on evaluation today patient actually appears to be doing very well in regard to her bilateral lower extremity ulcer's. In fact the left leg ulcer is completely  healed at this point. The right leg ulcer is doing much better and show signs of excellent improvement although it's not healed yet. I do feel like she's making good progress. 04/04/18 on evaluation today patient appears to be doing very well in regard to her right anterior lower extremity ulcer. She's been tolerating the dressing changes without complication. Fortunately for wound bed is appearing excellent as far as the overall appearance is concerned today. Electronic  Signature(s) Signed: 04/04/2018 4:16:42 PM By: Worthy Keeler PA-[name] Entered By: Worthy Keeler on 04/04/2018 11:03:13 Elizabeth [name], [name] ([id number]) -------------------------------------------------------------------------------- Physical Exam Details Patient Name: Hurd, Robbyn [name]. Date of Service: 04/04/2018 10:00 AM Medical Record Number: [id number] Patient Account Number: [id number] Date of Birth/Sex: June 23, 1931 (85 y.o. F) Treating RN: Montey Hora Primary Care Provider: Harrel Lemon Other Clinician: Referring Provider: Harrel Lemon Treating Provider/Extender: Melburn Hake, HOYT Weeks in Treatment: 39 Constitutional Well-nourished and well-hydrated in no acute distress. Respiratory normal breathing without difficulty. clear to auscultation bilaterally. Cardiovascular regular rate and rhythm with normal S1, S2. Psychiatric this patient is able to make decisions and demonstrates good insight into disease process. Alert and Oriented x 3. pleasant and cooperative. Notes Patient did have slight amount of Slough noted in the base of the wound which I was able to clean out with a sterile swab today she tolerated this without any pain whatsoever. The good news is the base of the wound has a very healthy granulation tissue. I think that it dressing changes which may be beneficial for her. Electronic Signature(s) Signed: 04/04/2018 4:16:42 PM By: Worthy Keeler PA-[name] Entered By: Worthy Keeler on 04/04/2018 11:03:45 Elizabeth [name], Elizabeth [name] ([id number]) -------------------------------------------------------------------------------- Physician Orders Details Patient Name: Elizabeth [name], [name] Date of Service: 04/04/2018 10:00 AM Medical Record Number: [id number] Patient Account Number: [id number] Date of Birth/Sex: 1931-10-14 (85 y.o. F) Treating RN: Montey Hora Primary Care Provider: Harrel Lemon Other Clinician: Referring Provider: Harrel Lemon Treating Provider/Extender: Melburn Hake, HOYT Weeks in  Treatment: 77 Verbal / Phone Orders: No Diagnosis Coding ICD-10 Coding Code Description [id number] Non-pressure chronic ulcer of other part of right lower leg with fat layer exposed L97.822 Non-pressure chronic ulcer of other part of left lower leg with fat layer exposed S81.802A Unspecified open wound, left lower leg, initial encounter S81.801A Unspecified open wound, right lower leg, initial encounter I48.0 Paroxysmal atrial fibrillation Z79.01 Long term (current) use of anticoagulants N18.3 Chronic kidney disease, stage 3 (moderate) Wound Cleansing Wound #1 Right,Anterior Lower Leg o May Shower, gently pat wound dry prior to applying new dressing. - Clean with soap and water Primary Wound Dressing Wound #1 Right,Anterior Lower Leg o Silver Collagen Secondary Dressing Wound #1 Right,Anterior Lower Leg o Conform/Kerlix - secure with netting o Non-adherent pad Dressing Change Frequency Wound #1 Right,Anterior Lower Leg o Change Dressing Monday, Wednesday, Friday Follow-up Appointments Wound #1 Right,Anterior Lower Leg o Return Appointment in 1 week. Edema Control Wound #1 Right,Anterior Lower Leg o Elevate legs to the level of the heart and pump ankles as often as possible Electronic Signature(s) Signed: 04/04/2018 4:16:42 PM By: Worthy Keeler PA-[name] Signed: 04/04/2018 4:37:01 PM By: Adam Phenix, Tanna Savoy ([id number]) Entered By: Montey Hora on 04/04/2018 10:51:05 Elizabeth [name], [name] ([id number]) -------------------------------------------------------------------------------- Problem List Details Patient Name: Strange, Halle [name]. Date of Service: 04/04/2018 10:00 AM Medical Record Number: [id number] Patient Account Number: [id number] Date of Birth/Sex: [birth year] (85 y.o. F) Treating RN: Montey Hora Primary Care Provider: Harrel Lemon Other Clinician: Referring Provider: Harrel Lemon Treating Provider/Extender:  STONE III, HOYT Weeks in Treatment: 11 Active  Problems ICD-10 Evaluated Encounter Code Description Active Date Today Diagnosis L97.812 Non-pressure chronic ulcer of other part of right lower leg 01/17/2018 No Yes with fat layer exposed L97.822 Non-pressure chronic ulcer of other part of left lower leg with 01/17/2018 No Yes fat layer exposed S81.802A Unspecified open wound, left lower leg, initial encounter 01/17/2018 No Yes S81.801A Unspecified open wound, right lower leg, initial encounter 01/17/2018 No Yes I48.0 Paroxysmal atrial fibrillation 01/17/2018 No Yes Z79.01 Long term (current) use of anticoagulants 01/17/2018 No Yes N18.3 Chronic kidney disease, stage 3 (moderate) 01/17/2018 No Yes Inactive Problems Resolved Problems Electronic Signature(s) Signed: 04/04/2018 4:16:42 PM By: Worthy Keeler PA-[name] Entered By: Worthy Keeler on 04/04/2018 10:26:51 Elizabeth [name], [name] ([id number]) -------------------------------------------------------------------------------- Progress Note Details Patient Name: Sparlin, Jalayla [name]. Date of Service: 04/04/2018 10:00 AM Medical Record Number: [id number] Patient Account Number: [id number] Date of Birth/Sex: [birth year] (85 y.o. F) Treating RN: Montey Hora Primary Care Provider: Harrel Lemon Other Clinician: Referring Provider: Harrel Lemon Treating Provider/Extender: Melburn Hake, HOYT Weeks in Treatment: 11 Subjective Chief Complaint Information obtained from Patient Bilateral LE Ulcers History of Present Illness (HPI) 01/17/18 on evaluation today patient presents for evaluation of two ulcers both on the anterior portion of her lower extremities one right and one left. The right she has had for about three weeks the left for about eight weeks and both occurred as a result of her traumatizing her shins on the bottom of the card were trying to get out of the car. She states this is never happen with any other car doors before but she has been having some issues with this sharp area on the store in  particular. Both occurred in the similar situation where she was trying not to open the door the whole way in order to avoid damaging anybody else's car beside them. She is been using peroxide initially to clean the wound although she has converted to the wound cleanser now. She has been using if anything just over the counter triple antibiotic ointment and a Band-Aid. With that being said she's not even been using that more recently. The most part she's just been attempting allow this to dry out/scab over. Her APIs were greater than 220 and noncompressible. Patient does have a history of paroxysmal atrial fibrillation for which she is on Coumadin. She also has chronic kidney disease stage III. She has been on antibiotics that I see no evidence of infection right now which is good news. 01/24/18 on evaluation today patient appears to be doing excellent in regard to her lower extremity ulcers. She has been tolerating the dressing changes without complication which is excellent news. Overall I have been very pleased with her progress. Nonetheless she still has have some ways to go before this will be completely healed. The left is a little bit better in appearance compared to the right which is somewhat deeper. 01/31/18 on evaluation today patient appears to be doing better in regard to her bilateral anterior lower extremity ulcers. She has been tolerating the dressing changes without complication which is excellent news. Fortunately there does not appear to be any evidence of infection at this time which is also good news. I'm very pleased with how things seem to be progressing at this point. The patient likewise is very happy and states she's not having the pain that she was having previous. 02/07/18 on evaluation today patient appears to be doing rather well in regard to  her ulcers. The left especially seems to be making good progress in the right does have more slough and poor granulation tissue on  the surface at this point. I think this is gonna require little bit more extensive debridement. Fortunately there's no signs of infection at this time. 02/14/18 on evaluation today patient appears to be doing better in the overall appearance of the wounds although she tells me she's been having more burning over the past week. There appears to be some adhesive irritation. That has me somewhat concerned as far as the reason for the burning is concerned. With that being said I do not see any signs of actual infection which is good news. No fevers, chills, nausea, or vomiting noted at this time. Overall there does not appear to be any sign that there is worsening to me and the only reason the right extremity wound is bigger is that I actually performed a fairly significant debridement last week. That maybe some of the reason why she's had a little bit more pain as well. 02/21/18 on evaluation today patient appears to be doing poorly in regard to her lower extremity ulcers unfortunately. She continues to have discomfort despite the dressing switch I don't think the dressing is the issue I believe she may actually have an infection and that could be the problem that we're facing at this point. Fortunately there's no signs of systemic infection. No fevers, chills, nausea, or vomiting noted at this time. Unfortunately I'm just not seeing the progress that I would expect especially after cleaning the wounds off sufficiently. 02/28/18 on evaluation today patient appears to be doing a little better in regard to her bilateral anterior lower Trinity ulcer. She still has some inflammation surrounding the wound but not nearly as bad as last week. Her culture did come back showing Funari, Thetis [name]. ([id number]) positive for Pseudomonas as the organism likely responsible for the infection. For that reason I did stop the doxycycline sent in a prescription for Levaquin for her today. Other than that and pleased with how  things seem to be progressing I do believe the alginate have been better for her. 03/07/18 on evaluation today patient actually appears to be doing very well in regard to her bilateral lower Trinity ulcer's. In fact she seems to be showing signs of excellent improvement I do believe that the Levaquin we prescribed has been of benefit for her the infection appears to be clearing quite nicely. I'm very pleased in that regard. Her pain is also much less severe compared to what it was previous. 03/14/18 on evaluation today patient actually appears to be doing much better in regard to her bilateral lower is from the ulcers. In fact she is appearing to almost be if not completely healed in regard to left lower extremity. Parts of the right lower extremity this is significantly improved although not completely healed. Fortunately there's no signs of active infection at this time I believe we may finally have got not under control which is excellent. 03/21/18 on evaluation today patient actually appears to be doing much better in regard to her bilateral lower extremity ulcer. The left ulcer still show signs of being covered with a dry eschar although it appears this is likely healed underneath I could not quite get this off easily and therefore I'm more apt to leave it in place until it starts to loosen up a little bit more than its own. In regard to the right it appears that this is doing  much better there's more granulation as well as epithelialization noted and she's not having any pain like what she was previous. 03/28/18 on evaluation today patient actually appears to be doing very well in regard to her bilateral lower extremity ulcer's. In fact the left leg ulcer is completely healed at this point. The right leg ulcer is doing much better and show signs of excellent improvement although it's not healed yet. I do feel like she's making good progress. 04/04/18 on evaluation today patient appears to be doing  very well in regard to her right anterior lower extremity ulcer. She's been tolerating the dressing changes without complication. Fortunately for wound bed is appearing excellent as far as the overall appearance is concerned today. Patient History Information obtained from Patient. Family History Cancer - Mother, Diabetes - Siblings, Heart Disease - Father,Siblings, Hypertension - Siblings, Kidney Disease - Siblings, No family history of Hereditary Spherocytosis, Lung Disease, Seizures. Social History Never smoker, Marital Status - Married, Alcohol Use - Never, Drug Use - No History, Caffeine Use - Rarely. Medical History Eyes Denies history of Cataracts, Glaucoma, Optic Neuritis Ear/Nose/Mouth/Throat Denies history of Chronic sinus problems/congestion, Middle ear problems Hematologic/Lymphatic Denies history of Anemia, Hemophilia, Human Immunodeficiency Virus, Lymphedema, Sickle Cell Disease Respiratory Denies history of Aspiration, Asthma, Chronic Obstructive Pulmonary Disease (COPD), Pneumothorax, Sleep Apnea, Tuberculosis Cardiovascular Patient has history of Congestive Heart Failure, Coronary Artery Disease Denies history of Angina, Arrhythmia, Deep Vein Thrombosis, Hypertension, Hypotension, Myocardial Infarction, Peripheral Arterial Disease, Peripheral Venous Disease, Phlebitis, Vasculitis Gastrointestinal Denies history of Cirrhosis , Colitis, Crohn s, Hepatitis A, Hepatitis B, Hepatitis [name] Endocrine Denies history of Type I Diabetes, Type II Diabetes Genitourinary Patient has history of End Stage Renal Disease Immunological Elizabeth [name], [name] ([id number]) Denies history of Lupus Erythematosus, Raynaud s, Scleroderma Integumentary (Skin) Denies history of History of Burn, History of pressure wounds Musculoskeletal Denies history of Gout, Rheumatoid Arthritis, Osteoarthritis, Osteomyelitis Neurologic Denies history of Dementia, Neuropathy, Quadriplegia,  Paraplegia Oncologic Denies history of Received Chemotherapy, Received Radiation Psychiatric Denies history of Anorexia/bulimia, Confinement Anxiety Review of Systems (ROS) Constitutional Symptoms (General Health) Denies complaints or symptoms of Fever, Chills. Respiratory The patient has no complaints or symptoms. Cardiovascular The patient has no complaints or symptoms. Psychiatric The patient has no complaints or symptoms. Objective Constitutional Well-nourished and well-hydrated in no acute distress. Vitals Time Taken: 10:10 AM, Height: 61 in, Weight: 100 lbs, BMI: 18.9, Temperature: 97.7 F, Pulse: 68 bpm, Respiratory Rate: 16 breaths/min, Blood Pressure: 141/43 mmHg. Respiratory normal breathing without difficulty. clear to auscultation bilaterally. Cardiovascular regular rate and rhythm with normal S1, S2. Psychiatric this patient is able to make decisions and demonstrates good insight into disease process. Alert and Oriented x 3. pleasant and cooperative. General Notes: Patient did have slight amount of Slough noted in the base of the wound which I was able to clean out with a sterile swab today she tolerated this without any pain whatsoever. The good news is the base of the wound has a very healthy granulation tissue. I think that it dressing changes which may be beneficial for her. Integumentary (Hair, Skin) Wound #1 status is Open. Original cause of wound was Trauma. The wound is located on the Right,Anterior Lower Leg. The wound measures 0.5cm length x 0.5cm width x 0.2cm depth; 0.196cm^2 area and 0.039cm^3 volume. There is Fat Layer (Subcutaneous Tissue) Exposed exposed. There is no tunneling or undermining noted. There is a medium amount of serous drainage noted. The wound margin is flat and intact. There  is medium (34-66%) red, pink granulation within the wound bed. Elizabeth [name], [name] ([id number]) There is a medium (34-66%) amount of necrotic tissue within the wound bed  including Adherent Slough. The periwound skin appearance exhibited: Hemosiderin Staining. The periwound skin appearance did not exhibit: Callus, Crepitus, Excoriation, Induration, Rash, Scarring, Dry/Scaly, Maceration, Atrophie Blanche, Cyanosis, Ecchymosis, Mottled, Pallor, Rubor, Erythema. Periwound temperature was noted as No Abnormality. The periwound has tenderness on palpation. Assessment Active Problems ICD-10 Non-pressure chronic ulcer of other part of right lower leg with fat layer exposed Non-pressure chronic ulcer of other part of left lower leg with fat layer exposed Unspecified open wound, left lower leg, initial encounter Unspecified open wound, right lower leg, initial encounter Paroxysmal atrial fibrillation Long term (current) use of anticoagulants Chronic kidney disease, stage 3 (moderate) Plan Wound Cleansing: Wound #1 Right,Anterior Lower Leg: May Shower, gently pat wound dry prior to applying new dressing. - Clean with soap and water Primary Wound Dressing: Wound #1 Right,Anterior Lower Leg: Silver Collagen Secondary Dressing: Wound #1 Right,Anterior Lower Leg: Conform/Kerlix - secure with netting Non-adherent pad Dressing Change Frequency: Wound #1 Right,Anterior Lower Leg: Change Dressing Monday, Wednesday, Friday Follow-up Appointments: Wound #1 Right,Anterior Lower Leg: Return Appointment in 1 week. Edema Control: Wound #1 Right,Anterior Lower Leg: Elevate legs to the level of the heart and pump ankles as often as possible My suggestion at this point is gonna be that we go ahead and continue with the above wound care measures for the next week and the patient is in agreement with plan. If anything changes or worsens meantime shall contact the office and let me know. Otherwise hopefully she will continue to show improvement week by week. Please see above for specific wound care orders. We will see patient for re-evaluation in 1 week(s) here in the clinic.  If anything worsens or changes patient will contact our office for additional recommendations. LAMIKA, CONNOLLY ([id number]) Electronic Signature(s) Signed: 04/04/2018 4:16:42 PM By: Worthy Keeler PA-[name] Entered By: Worthy Keeler on 04/04/2018 11:04:15 Elizabeth [name], [name] ([id number]) -------------------------------------------------------------------------------- ROS/PFSH Details Patient Name: Elizabeth [name], [name] Date of Service: 04/04/2018 10:00 AM Medical Record Number: [id number] Patient Account Number: [id number] Date of Birth/Sex: 12-24-1931 (85 y.o. F) Treating RN: Montey Hora Primary Care Provider: Harrel Lemon Other Clinician: Referring Provider: Harrel Lemon Treating Provider/Extender: Melburn Hake, HOYT Weeks in Treatment: 11 Information Obtained From Patient Wound History Do you currently have one or more open woundso Yes How many open wounds do you currently haveo 2 Approximately how long have you had your woundso 8 weeks How have you been treating your wound(s) until nowo peroxide, neosporin and wound cleanser and bandage. Has your wound(s) ever healed and then re-openedo No Have you had any lab work done in the past montho No Have you tested positive for an antibiotic resistant organism No (MRSA, VRE)o Have you tested positive for osteomyelitis (bone infection)o No Have you had any tests for circulation on your legso No Constitutional Symptoms (General Health) Complaints and Symptoms: Negative for: Fever; Chills Eyes Medical History: Negative for: Cataracts; Glaucoma; Optic Neuritis Ear/Nose/Mouth/Throat Medical History: Negative for: Chronic sinus problems/congestion; Middle ear problems Hematologic/Lymphatic Medical History: Negative for: Anemia; Hemophilia; Human Immunodeficiency Virus; Lymphedema; Sickle Cell Disease Respiratory Complaints and Symptoms: No Complaints or Symptoms Medical History: Negative for: Aspiration; Asthma; Chronic Obstructive Pulmonary  Disease (COPD); Pneumothorax; Sleep Apnea; Tuberculosis Cardiovascular Complaints and Symptoms: No Complaints or Symptoms Elizabeth [name], [name] ([id number]) Medical History: Positive for: Congestive Heart Failure; Coronary Artery Disease  Negative for: Angina; Arrhythmia; Deep Vein Thrombosis; Hypertension; Hypotension; Myocardial Infarction; Peripheral Arterial Disease; Peripheral Venous Disease; Phlebitis; Vasculitis Gastrointestinal Medical History: Negative for: Cirrhosis ; Colitis; Crohnos; Hepatitis A; Hepatitis B; Hepatitis [name] Endocrine Medical History: Negative for: Type I Diabetes; Type II Diabetes Genitourinary Medical History: Positive for: End Stage Renal Disease Immunological Medical History: Negative for: Lupus Erythematosus; Raynaudos; Scleroderma Integumentary (Skin) Medical History: Negative for: History of Burn; History of pressure wounds Musculoskeletal Medical History: Negative for: Gout; Rheumatoid Arthritis; Osteoarthritis; Osteomyelitis Neurologic Medical History: Negative for: Dementia; Neuropathy; Quadriplegia; Paraplegia Oncologic Medical History: Negative for: Received Chemotherapy; Received Radiation Psychiatric Complaints and Symptoms: No Complaints or Symptoms Medical History: Negative for: Anorexia/bulimia; Confinement Anxiety Immunizations Pneumococcal Vaccine: Received Pneumococcal Vaccination: Yes Implantable Devices No devices added Doom, [name] ([id number]) Family and Social History Cancer: Yes - Mother; Diabetes: Yes - Siblings; Heart Disease: Yes - Father,Siblings; Hereditary Spherocytosis: No; Hypertension: Yes - Siblings; Kidney Disease: Yes - Siblings; Lung Disease: No; Seizures: No; Never smoker; Marital Status - Married; Alcohol Use: Never; Drug Use: No History; Caffeine Use: Rarely; Financial Concerns: No; Food, Clothing or Shelter Needs: No; Support System Lacking: No; Transportation Concerns: No; Advanced Directives: Yes (Not  Provided); Patient does not want information on Advanced Directives; Do not resuscitate: Yes (Not Provided); Living Will: Yes (Not Provided); Medical Power of Attorney: Yes (Not Provided) Physician Affirmation I have reviewed and agree with the above information. Electronic Signature(s) Signed: 04/04/2018 4:16:42 PM By: Worthy Keeler PA-[name] Signed: 04/04/2018 4:37:01 PM By: Montey Hora Entered By: Worthy Keeler on 04/04/2018 11:03:26 Kelter, Brayla [name]. ([id number]) -------------------------------------------------------------------------------- SuperBill Details Patient Name: Ritson, Myriah [name]. Date of Service: 04/04/2018 Medical Record Number: [id number] Patient Account Number: [id number] Date of Birth/Sex: [birth year] (85 y.o. F) Treating RN: Montey Hora Primary Care Provider: Harrel Lemon Other Clinician: Referring Provider: Harrel Lemon Treating Provider/Extender: Melburn Hake, HOYT Weeks in Treatment: 11 Diagnosis Coding ICD-10 Codes Code Description [phone] Non-pressure chronic ulcer of other part of right lower leg with fat layer exposed L97.822 Non-pressure chronic ulcer of other part of left lower leg with fat layer exposed S81.802A Unspecified open wound, left lower leg, initial encounter S81.801A Unspecified open wound, right lower leg, initial encounter I48.0 Paroxysmal atrial fibrillation Z79.01 Long term (current) use of anticoagulants N18.3 Chronic kidney disease, stage 3 (moderate) Facility Procedures CPT4 Code: [id number] Description: 99213 - WOUND CARE VISIT-LEV 3 EST PT Modifier: Quantity: 1 Physician Procedures CPT4 Code Description: [id number] 99214 - WC PHYS LEVEL 4 - EST PT ICD-10 Diagnosis Description K59.935 Non-pressure chronic ulcer of other part of right lower leg w T01.779 Non-pressure chronic ulcer of other part of left lower leg wi S81.802A Unspecified  open wound, left lower leg, initial encounter S81.801A Unspecified open wound, right lower leg, initial  encounter Modifier: ith fat layer expo th fat layer expos Quantity: 1 sed ed Electronic Signature(s) Signed: 04/04/2018 4:16:42 PM By: Worthy Keeler PA-[name] Entered By: Worthy Keeler on 04/04/2018 11:04:56

## 2018-04-08 DIAGNOSIS — E538 Deficiency of other specified B group vitamins: Secondary | ICD-10-CM | POA: Diagnosis not present

## 2018-04-11 ENCOUNTER — Encounter: Payer: PPO | Attending: Physician Assistant | Admitting: Physician Assistant

## 2018-04-11 ENCOUNTER — Other Ambulatory Visit: Payer: Self-pay

## 2018-04-11 DIAGNOSIS — L97822 Non-pressure chronic ulcer of other part of left lower leg with fat layer exposed: Secondary | ICD-10-CM | POA: Diagnosis not present

## 2018-04-11 DIAGNOSIS — L97812 Non-pressure chronic ulcer of other part of right lower leg with fat layer exposed: Secondary | ICD-10-CM | POA: Insufficient documentation

## 2018-04-11 DIAGNOSIS — N186 End stage renal disease: Secondary | ICD-10-CM | POA: Insufficient documentation

## 2018-04-11 DIAGNOSIS — I509 Heart failure, unspecified: Secondary | ICD-10-CM | POA: Diagnosis not present

## 2018-04-11 DIAGNOSIS — Z8249 Family history of ischemic heart disease and other diseases of the circulatory system: Secondary | ICD-10-CM | POA: Diagnosis not present

## 2018-04-11 DIAGNOSIS — Z809 Family history of malignant neoplasm, unspecified: Secondary | ICD-10-CM | POA: Insufficient documentation

## 2018-04-11 DIAGNOSIS — Z7901 Long term (current) use of anticoagulants: Secondary | ICD-10-CM | POA: Diagnosis not present

## 2018-04-11 DIAGNOSIS — I480 Paroxysmal atrial fibrillation: Secondary | ICD-10-CM | POA: Diagnosis not present

## 2018-04-11 DIAGNOSIS — I2510 Atherosclerotic heart disease of native coronary artery without angina pectoris: Secondary | ICD-10-CM | POA: Insufficient documentation

## 2018-04-11 DIAGNOSIS — Z841 Family history of disorders of kidney and ureter: Secondary | ICD-10-CM | POA: Diagnosis not present

## 2018-04-11 DIAGNOSIS — E039 Hypothyroidism, unspecified: Secondary | ICD-10-CM | POA: Diagnosis not present

## 2018-04-11 NOTE — Progress Notes (Signed)
BRYCE, KIMBLE ([id number]) Visit Report for 04/11/2018 Chief Complaint Document Details Patient Name: Elizabeth [name], [name] Date of Service: 04/11/2018 10:45 AM Medical Record Number: [id number] Patient Account Number: [id number] Date of Birth/Sex: 12/10/1931 (85 y.o. F) Treating RN: Montey Hora Primary Care Provider: Harrel Lemon Other Clinician: Referring Provider: Harrel Lemon Treating Provider/Extender: Melburn Hake, Nakai Yard Weeks in Treatment: 12 Information Obtained from: Patient Chief Complaint Bilateral LE Ulcers Electronic Signature(s) Signed: 04/11/2018 3:18:49 PM By: Worthy Keeler PA-[name] Entered By: Worthy Keeler on 04/11/2018 10:48:16 Elizabeth [name], [name] ([id number]) -------------------------------------------------------------------------------- HPI Details Patient Name: Elizabeth [name], [name] Date of Service: 04/11/2018 10:45 AM Medical Record Number: [id number] Patient Account Number: [id number] Date of Birth/Sex: [birth year] (85 y.o. F) Treating RN: Montey Hora Primary Care Provider: Harrel Lemon Other Clinician: Referring Provider: Harrel Lemon Treating Provider/Extender: Melburn Hake, Amika Tassin Weeks in Treatment: 12 History of Present Illness HPI Description: 01/17/18 on evaluation today patient presents for evaluation of two ulcers both on the anterior portion of her lower extremities one right and one left. The right she has had for about three weeks the left for about eight weeks and both occurred as a result of her traumatizing her shins on the bottom of the card were trying to get out of the car. She states this is never happen with any other car doors before but she has been having some issues with this sharp area on the store in particular. Both occurred in the similar situation where she was trying not to open the door the whole way in order to avoid damaging anybody else's car beside them. She is been using peroxide initially to clean the wound although she has converted to the wound  cleanser now. She has been using if anything just over the counter triple antibiotic ointment and a Band-Aid. With that being said she's not even been using that more recently. The most part she's just been attempting allow this to dry out/scab over. Her APIs were greater than 220 and noncompressible. Patient does have a history of paroxysmal atrial fibrillation for which she is on Coumadin. She also has chronic kidney disease stage III. She has been on antibiotics that I see no evidence of infection right now which is good news. 01/24/18 on evaluation today patient appears to be doing excellent in regard to her lower extremity ulcers. She has been tolerating the dressing changes without complication which is excellent news. Overall I have been very pleased with her progress. Nonetheless she still has have some ways to go before this will be completely healed. The left is a little bit better in appearance compared to the right which is somewhat deeper. 01/31/18 on evaluation today patient appears to be doing better in regard to her bilateral anterior lower extremity ulcers. She has been tolerating the dressing changes without complication which is excellent news. Fortunately there does not appear to be any evidence of infection at this time which is also good news. I'm very pleased with how things seem to be progressing at this point. The patient likewise is very happy and states she's not having the pain that she was having previous. 02/07/18 on evaluation today patient appears to be doing rather well in regard to her ulcers. The left especially seems to be making good progress in the right does have more slough and poor granulation tissue on the surface at this point. I think this is gonna require little bit more extensive debridement. Fortunately there's no signs of infection at this time. 02/14/18  on evaluation today patient appears to be doing better in the overall appearance of the wounds although  she tells me she's been having more burning over the past week. There appears to be some adhesive irritation. That has me somewhat concerned as far as the reason for the burning is concerned. With that being said I do not see any signs of actual infection which is good news. No fevers, chills, nausea, or vomiting noted at this time. Overall there does not appear to be any sign that there is worsening to me and the only reason the right extremity wound is bigger is that I actually performed a fairly significant debridement last week. That maybe some of the reason why she's had a little bit more pain as well. 02/21/18 on evaluation today patient appears to be doing poorly in regard to her lower extremity ulcers unfortunately. She continues to have discomfort despite the dressing switch I don't think the dressing is the issue I believe she may actually have an infection and that could be the problem that we're facing at this point. Fortunately there's no signs of systemic infection. No fevers, chills, nausea, or vomiting noted at this time. Unfortunately I'm just not seeing the progress that I would expect especially after cleaning the wounds off sufficiently. 02/28/18 on evaluation today patient appears to be doing a little better in regard to her bilateral anterior lower Trinity ulcer. She still has some inflammation surrounding the wound but not nearly as bad as last week. Her culture did come back showing positive for Pseudomonas as the organism likely responsible for the infection. For that reason I did stop the doxycycline sent in a prescription for Levaquin for her today. Other than that and pleased with how things seem to be progressing I do believe the alginate have been better for her. 03/07/18 on evaluation today patient actually appears to be doing very well in regard to her bilateral lower Trinity ulcer's. In fact she seems to be showing signs of excellent improvement I do believe that the  Levaquin we prescribed has been of benefit for Elizabeth [name], [name] ([id number]) her the infection appears to be clearing quite nicely. I'm very pleased in that regard. Her pain is also much less severe compared to what it was previous. 03/14/18 on evaluation today patient actually appears to be doing much better in regard to her bilateral lower is from the ulcers. In fact she is appearing to almost be if not completely healed in regard to left lower extremity. Parts of the right lower extremity this is significantly improved although not completely healed. Fortunately there's no signs of active infection at this time I believe we may finally have got not under control which is excellent. 03/21/18 on evaluation today patient actually appears to be doing much better in regard to her bilateral lower extremity ulcer. The left ulcer still show signs of being covered with a dry eschar although it appears this is likely healed underneath I could not quite get this off easily and therefore I'm more apt to leave it in place until it starts to loosen up a little bit more than its own. In regard to the right it appears that this is doing much better there's more granulation as well as epithelialization noted and she's not having any pain like what she was previous. 03/28/18 on evaluation today patient actually appears to be doing very well in regard to her bilateral lower extremity ulcer's. In fact the left leg ulcer is completely  healed at this point. The right leg ulcer is doing much better and show signs of excellent improvement although it's not healed yet. I do feel like she's making good progress. 04/04/18 on evaluation today patient appears to be doing very well in regard to her right anterior lower extremity ulcer. She's been tolerating the dressing changes without complication. Fortunately for wound bed is appearing excellent as far as the overall appearance is concerned today. 04/11/18 on evaluation today  patient's right lower extremity appears to almost be completely healed. She's been tolerating the dressing changes without complication which is excellent news. Overall very pleased with the progression at this point. In fact she has just a very small opening remaining on the anterior right lower extremity. She is having no discomfort. Electronic Signature(s) Signed: 04/11/2018 3:18:49 PM By: Worthy Keeler PA-[name] Entered By: Worthy Keeler on 04/11/2018 13:37:42 Elizabeth [name], [name] ([id number]) -------------------------------------------------------------------------------- Physical Exam Details Patient Name: Shultis, Marrian [name]. Date of Service: 04/11/2018 10:45 AM Medical Record Number: [id number] Patient Account Number: [id number] Date of Birth/Sex: Oct 11, 1931 (85 y.o. F) Treating RN: Montey Hora Primary Care Provider: Harrel Lemon Other Clinician: Referring Provider: Harrel Lemon Treating Provider/Extender: Melburn Hake, Jayven Naill Weeks in Treatment: 72 Constitutional Well-nourished and well-hydrated in no acute distress. Respiratory normal breathing without difficulty. clear to auscultation bilaterally. Cardiovascular regular rate and rhythm with normal S1, S2. Psychiatric this patient is able to make decisions and demonstrates good insight into disease process. Alert and Oriented x 3. pleasant and cooperative. Notes Patient's wound bed currently shows signs of good granulation and epithelialization at this point is just a very small opening still remaining I think she's very close to complete closure. There is no evidence of active infection at this time which is excellent news. Electronic Signature(s) Signed: 04/11/2018 3:18:49 PM By: Worthy Keeler PA-[name] Entered By: Worthy Keeler on 04/11/2018 13:38:05 Elizabeth [name], Elizabeth [name] Elizabeth [name] ([id number]) -------------------------------------------------------------------------------- Physician Orders Details Patient Name: Reas, Jennilyn [name]. Date of Service: 04/11/2018  10:45 AM Medical Record Number: [id number] Patient Account Number: [id number] Date of Birth/Sex: 07/20/1931 (85 y.o. F) Treating RN: Montey Hora Primary Care Provider: Harrel Lemon Other Clinician: Referring Provider: Harrel Lemon Treating Provider/Extender: Melburn Hake, Rennee Coyne Weeks in Treatment: 49 Verbal / Phone Orders: No Diagnosis Coding ICD-10 Coding Code Description [phone] Non-pressure chronic ulcer of other part of right lower leg with fat layer exposed L97.822 Non-pressure chronic ulcer of other part of left lower leg with fat layer exposed S81.802A Unspecified open wound, left lower leg, initial encounter S81.801A Unspecified open wound, right lower leg, initial encounter I48.0 Paroxysmal atrial fibrillation Z79.01 Long term (current) use of anticoagulants N18.3 Chronic kidney disease, stage 3 (moderate) Wound Cleansing Wound #1 Right,Anterior Lower Leg o May Shower, gently pat wound dry prior to applying new dressing. - Clean with soap and water Primary Wound Dressing Wound #1 Right,Anterior Lower Leg o Xeroform Secondary Dressing Wound #1 Right,Anterior Lower Leg o Conform/Kerlix - secure with netting o Non-adherent pad Dressing Change Frequency Wound #1 Right,Anterior Lower Leg o Change Dressing Monday, Wednesday, Friday Follow-up Appointments Wound #1 Right,Anterior Lower Leg o Return Appointment in 1 week. Edema Control Wound #1 Right,Anterior Lower Leg o Elevate legs to the level of the heart and pump ankles as often as possible Electronic Signature(s) Signed: 04/11/2018 3:08:04 PM By: Montey Hora Signed: 04/11/2018 3:18:49 PM By: Cephas Darby, Jessicamarie [name]. ([id number]) Entered By: Montey Hora on 04/11/2018 11:43:31 Ferris, Issabelle [name]. ([id number]) -------------------------------------------------------------------------------- Problem List Details Patient Name: Elizabeth Legato  [name]. Date of Service: 04/11/2018 10:45 AM Medical Record Number:  [id number] Patient Account Number: [id number] Date of Birth/Sex: 04-Jun-1931 (85 y.o. F) Treating RN: Montey Hora Primary Care Provider: Harrel Lemon Other Clinician: Referring Provider: Harrel Lemon Treating Provider/Extender: Melburn Hake, Vergil Burby Weeks in Treatment: 12 Active Problems ICD-10 Evaluated Encounter Code Description Active Date Today Diagnosis L97.812 Non-pressure chronic ulcer of other part of right lower leg 01/17/2018 No Yes with fat layer exposed L97.822 Non-pressure chronic ulcer of other part of left lower leg with 01/17/2018 No Yes fat layer exposed S81.802A Unspecified open wound, left lower leg, initial encounter 01/17/2018 No Yes S81.801A Unspecified open wound, right lower leg, initial encounter 01/17/2018 No Yes I48.0 Paroxysmal atrial fibrillation 01/17/2018 No Yes Z79.01 Long term (current) use of anticoagulants 01/17/2018 No Yes N18.3 Chronic kidney disease, stage 3 (moderate) 01/17/2018 No Yes Inactive Problems Resolved Problems Electronic Signature(s) Signed: 04/11/2018 3:18:49 PM By: Worthy Keeler PA-[name] Entered By: Worthy Keeler on 04/11/2018 10:48:06 Elizabeth [name], [name] ([id number]) -------------------------------------------------------------------------------- Progress Note Details Patient Name: Elizabeth [name], [name] Date of Service: 04/11/2018 10:45 AM Medical Record Number: [id number] Patient Account Number: [id number] Date of Birth/Sex: 02/17/1931 (85 y.o. F) Treating RN: Montey Hora Primary Care Provider: Harrel Lemon Other Clinician: Referring Provider: Harrel Lemon Treating Provider/Extender: Melburn Hake, Caron Tardif Weeks in Treatment: 12 Subjective Chief Complaint Information obtained from Patient Bilateral LE Ulcers History of Present Illness (HPI) 01/17/18 on evaluation today patient presents for evaluation of two ulcers both on the anterior portion of her lower extremities one right and one left. The right she has had for about three weeks the left for  about eight weeks and both occurred as a result of her traumatizing her shins on the bottom of the card were trying to get out of the car. She states this is never happen with any other car doors before but she has been having some issues with this sharp area on the store in particular. Both occurred in the similar situation where she was trying not to open the door the whole way in order to avoid damaging anybody else's car beside them. She is been using peroxide initially to clean the wound although she has converted to the wound cleanser now. She has been using if anything just over the counter triple antibiotic ointment and a Band-Aid. With that being said she's not even been using that more recently. The most part she's just been attempting allow this to dry out/scab over. Her APIs were greater than 220 and noncompressible. Patient does have a history of paroxysmal atrial fibrillation for which she is on Coumadin. She also has chronic kidney disease stage III. She has been on antibiotics that I see no evidence of infection right now which is good news. 01/24/18 on evaluation today patient appears to be doing excellent in regard to her lower extremity ulcers. She has been tolerating the dressing changes without complication which is excellent news. Overall I have been very pleased with her progress. Nonetheless she still has have some ways to go before this will be completely healed. The left is a little bit better in appearance compared to the right which is somewhat deeper. 01/31/18 on evaluation today patient appears to be doing better in regard to her bilateral anterior lower extremity ulcers. She has been tolerating the dressing changes without complication which is excellent news. Fortunately there does not appear to be any evidence of infection at this time which is also good news. I'm very pleased with how things  seem to be progressing at this point. The patient likewise is very happy and  states she's not having the pain that she was having previous. 02/07/18 on evaluation today patient appears to be doing rather well in regard to her ulcers. The left especially seems to be making good progress in the right does have more slough and poor granulation tissue on the surface at this point. I think this is gonna require little bit more extensive debridement. Fortunately there's no signs of infection at this time. 02/14/18 on evaluation today patient appears to be doing better in the overall appearance of the wounds although she tells me she's been having more burning over the past week. There appears to be some adhesive irritation. That has me somewhat concerned as far as the reason for the burning is concerned. With that being said I do not see any signs of actual infection which is good news. No fevers, chills, nausea, or vomiting noted at this time. Overall there does not appear to be any sign that there is worsening to me and the only reason the right extremity wound is bigger is that I actually performed a fairly significant debridement last week. That maybe some of the reason why she's had a little bit more pain as well. 02/21/18 on evaluation today patient appears to be doing poorly in regard to her lower extremity ulcers unfortunately. She continues to have discomfort despite the dressing switch I don't think the dressing is the issue I believe she may actually have an infection and that could be the problem that we're facing at this point. Fortunately there's no signs of systemic infection. No fevers, chills, nausea, or vomiting noted at this time. Unfortunately I'm just not seeing the progress that I would expect especially after cleaning the wounds off sufficiently. 02/28/18 on evaluation today patient appears to be doing a little better in regard to her bilateral anterior lower Trinity ulcer. She still has some inflammation surrounding the wound but not nearly as bad as last week.  Her culture did come back showing Rice, Kizzi [name]. ([id number]) positive for Pseudomonas as the organism likely responsible for the infection. For that reason I did stop the doxycycline sent in a prescription for Levaquin for her today. Other than that and pleased with how things seem to be progressing I do believe the alginate have been better for her. 03/07/18 on evaluation today patient actually appears to be doing very well in regard to her bilateral lower Trinity ulcer's. In fact she seems to be showing signs of excellent improvement I do believe that the Levaquin we prescribed has been of benefit for her the infection appears to be clearing quite nicely. I'm very pleased in that regard. Her pain is also much less severe compared to what it was previous. 03/14/18 on evaluation today patient actually appears to be doing much better in regard to her bilateral lower is from the ulcers. In fact she is appearing to almost be if not completely healed in regard to left lower extremity. Parts of the right lower extremity this is significantly improved although not completely healed. Fortunately there's no signs of active infection at this time I believe we may finally have got not under control which is excellent. 03/21/18 on evaluation today patient actually appears to be doing much better in regard to her bilateral lower extremity ulcer. The left ulcer still show signs of being covered with a dry eschar although it appears this is likely healed underneath I could not  quite get this off easily and therefore I'm more apt to leave it in place until it starts to loosen up a little bit more than its own. In regard to the right it appears that this is doing much better there's more granulation as well as epithelialization noted and she's not having any pain like what she was previous. 03/28/18 on evaluation today patient actually appears to be doing very well in regard to her bilateral lower extremity ulcer's.  In fact the left leg ulcer is completely healed at this point. The right leg ulcer is doing much better and show signs of excellent improvement although it's not healed yet. I do feel like she's making good progress. 04/04/18 on evaluation today patient appears to be doing very well in regard to her right anterior lower extremity ulcer. She's been tolerating the dressing changes without complication. Fortunately for wound bed is appearing excellent as far as the overall appearance is concerned today. 04/11/18 on evaluation today patient's right lower extremity appears to almost be completely healed. She's been tolerating the dressing changes without complication which is excellent news. Overall very pleased with the progression at this point. In fact she has just a very small opening remaining on the anterior right lower extremity. She is having no discomfort. Patient History Information obtained from Patient. Family History Cancer - Mother, Diabetes - Siblings, Heart Disease - Father,Siblings, Hypertension - Siblings, Kidney Disease - Siblings, No family history of Hereditary Spherocytosis, Lung Disease, Seizures. Social History Never smoker, Marital Status - Married, Alcohol Use - Never, Drug Use - No History, Caffeine Use - Rarely. Medical History Eyes Denies history of Cataracts, Glaucoma, Optic Neuritis Ear/Nose/Mouth/Throat Denies history of Chronic sinus problems/congestion, Middle ear problems Hematologic/Lymphatic Denies history of Anemia, Hemophilia, Human Immunodeficiency Virus, Lymphedema, Sickle Cell Disease Respiratory Denies history of Aspiration, Asthma, Chronic Obstructive Pulmonary Disease (COPD), Pneumothorax, Sleep Apnea, Tuberculosis Cardiovascular Patient has history of Congestive Heart Failure, Coronary Artery Disease Denies history of Angina, Arrhythmia, Deep Vein Thrombosis, Hypertension, Hypotension, Myocardial Infarction, Peripheral Arterial Disease, Peripheral  Venous Disease, Phlebitis, Vasculitis Gastrointestinal Denies history of Cirrhosis , Colitis, Crohn s, Hepatitis A, Hepatitis B, Hepatitis [name] Endocrine Geiman, Greenley [name]. ([id number]) Denies history of Type I Diabetes, Type II Diabetes Genitourinary Patient has history of End Stage Renal Disease Immunological Denies history of Lupus Erythematosus, Raynaud s, Scleroderma Integumentary (Skin) Denies history of History of Burn, History of pressure wounds Musculoskeletal Denies history of Gout, Rheumatoid Arthritis, Osteoarthritis, Osteomyelitis Neurologic Denies history of Dementia, Neuropathy, Quadriplegia, Paraplegia Oncologic Denies history of Received Chemotherapy, Received Radiation Psychiatric Denies history of Anorexia/bulimia, Confinement Anxiety Review of Systems (ROS) Constitutional Symptoms (General Health) Denies complaints or symptoms of Fever, Chills. Respiratory The patient has no complaints or symptoms. Cardiovascular The patient has no complaints or symptoms. Psychiatric The patient has no complaints or symptoms. Objective Constitutional Well-nourished and well-hydrated in no acute distress. Vitals Time Taken: 11:19 AM, Height: 61 in, Weight: 100 lbs, BMI: 18.9, Temperature: 97.6 F, Pulse: 81 bpm, Respiratory Rate: 16 breaths/min, Blood Pressure: 113/52 mmHg. Respiratory normal breathing without difficulty. clear to auscultation bilaterally. Cardiovascular regular rate and rhythm with normal S1, S2. Psychiatric this patient is able to make decisions and demonstrates good insight into disease process. Alert and Oriented x 3. pleasant and cooperative. General Notes: Patient's wound bed currently shows signs of good granulation and epithelialization at this point is just a very small opening still remaining I think she's very close to complete closure. There is no evidence of active  infection at this time which is excellent news. Integumentary (Hair, Skin) Elizabeth [name],  [name] ([id number]) Wound #1 status is Open. Original cause of wound was Trauma. The wound is located on the Right,Anterior Lower Leg. The wound measures 0.3cm length x 0.3cm width x 0.1cm depth; 0.071cm^2 area and 0.007cm^3 volume. There is Fat Layer (Subcutaneous Tissue) Exposed exposed. There is no tunneling or undermining noted. There is a small amount of serous drainage noted. The wound margin is flat and intact. There is medium (34-66%) red, pink granulation within the wound bed. There is a small (1-33%) amount of necrotic tissue within the wound bed including Adherent Slough. The periwound skin appearance exhibited: Hemosiderin Staining. The periwound skin appearance did not exhibit: Callus, Crepitus, Excoriation, Induration, Rash, Scarring, Dry/Scaly, Maceration, Atrophie Blanche, Cyanosis, Ecchymosis, Mottled, Pallor, Rubor, Erythema. Periwound temperature was noted as No Abnormality. The periwound has tenderness on palpation. Assessment Active Problems ICD-10 Non-pressure chronic ulcer of other part of right lower leg with fat layer exposed Non-pressure chronic ulcer of other part of left lower leg with fat layer exposed Unspecified open wound, left lower leg, initial encounter Unspecified open wound, right lower leg, initial encounter Paroxysmal atrial fibrillation Long term (current) use of anticoagulants Chronic kidney disease, stage 3 (moderate) Plan Wound Cleansing: Wound #1 Right,Anterior Lower Leg: May Shower, gently pat wound dry prior to applying new dressing. - Clean with soap and water Primary Wound Dressing: Wound #1 Right,Anterior Lower Leg: Xeroform Secondary Dressing: Wound #1 Right,Anterior Lower Leg: Conform/Kerlix - secure with netting Non-adherent pad Dressing Change Frequency: Wound #1 Right,Anterior Lower Leg: Change Dressing Monday, Wednesday, Friday Follow-up Appointments: Wound #1 Right,Anterior Lower Leg: Return Appointment in 1 week. Edema  Control: Wound #1 Right,Anterior Lower Leg: Elevate legs to the level of the heart and pump ankles as often as possible I'm gonna recommend that we initiate treatment with a Xeroform gauze dressing today just to provide some moisture to allow this to completely close since there is very little drainage at this time I think the Prisma is more likely just to get stuck. She's in agreement that plan. We will subsequently see were things that at follow-up anything changes or worsens. Elizabeth [name], [name] ([id number]) Please see above for specific wound care orders. We will see patient for re-evaluation in 1 week(s) here in the clinic. If anything worsens or changes patient will contact our office for additional recommendations. Electronic Signature(s) Signed: 04/11/2018 3:18:49 PM By: Worthy Keeler PA-[name] Entered By: Worthy Keeler on 04/11/2018 13:38:30 Elizabeth [name], [name] ([id number]) -------------------------------------------------------------------------------- ROS/PFSH Details Patient Name: Elizabeth [name], [name] Date of Service: 04/11/2018 10:45 AM Medical Record Number: [id number] Patient Account Number: [id number] Date of Birth/Sex: February 22, 1931 (85 y.o. F) Treating RN: Montey Hora Primary Care Provider: Harrel Lemon Other Clinician: Referring Provider: Harrel Lemon Treating Provider/Extender: Melburn Hake, Thomson Herbers Weeks in Treatment: 12 Information Obtained From Patient Wound History Do you currently have one or more open woundso Yes How many open wounds do you currently haveo 2 Approximately how long have you had your woundso 8 weeks How have you been treating your wound(s) until nowo peroxide, neosporin and wound cleanser and bandage. Has your wound(s) ever healed and then re-openedo No Have you had any lab work done in the past montho No Have you tested positive for an antibiotic resistant organism No (MRSA, VRE)o Have you tested positive for osteomyelitis (bone infection)o No Have you had any  tests for circulation on your legso No Constitutional Symptoms (General Health) Complaints and Symptoms:  Negative for: Fever; Chills Eyes Medical History: Negative for: Cataracts; Glaucoma; Optic Neuritis Ear/Nose/Mouth/Throat Medical History: Negative for: Chronic sinus problems/congestion; Middle ear problems Hematologic/Lymphatic Medical History: Negative for: Anemia; Hemophilia; Human Immunodeficiency Virus; Lymphedema; Sickle Cell Disease Respiratory Complaints and Symptoms: No Complaints or Symptoms Medical History: Negative for: Aspiration; Asthma; Chronic Obstructive Pulmonary Disease (COPD); Pneumothorax; Sleep Apnea; Tuberculosis Cardiovascular Complaints and Symptoms: No Complaints or Symptoms Jilek, Breyah [name]. ([id number]) Medical History: Positive for: Congestive Heart Failure; Coronary Artery Disease Negative for: Angina; Arrhythmia; Deep Vein Thrombosis; Hypertension; Hypotension; Myocardial Infarction; Peripheral Arterial Disease; Peripheral Venous Disease; Phlebitis; Vasculitis Gastrointestinal Medical History: Negative for: Cirrhosis ; Colitis; Crohnos; Hepatitis A; Hepatitis B; Hepatitis [name] Endocrine Medical History: Negative for: Type I Diabetes; Type II Diabetes Genitourinary Medical History: Positive for: End Stage Renal Disease Immunological Medical History: Negative for: Lupus Erythematosus; Raynaudos; Scleroderma Integumentary (Skin) Medical History: Negative for: History of Burn; History of pressure wounds Musculoskeletal Medical History: Negative for: Gout; Rheumatoid Arthritis; Osteoarthritis; Osteomyelitis Neurologic Medical History: Negative for: Dementia; Neuropathy; Quadriplegia; Paraplegia Oncologic Medical History: Negative for: Received Chemotherapy; Received Radiation Psychiatric Complaints and Symptoms: No Complaints or Symptoms Medical History: Negative for: Anorexia/bulimia; Confinement Anxiety Immunizations Pneumococcal  Vaccine: Received Pneumococcal Vaccination: Yes Implantable Devices No devices added Elizabeth [name], Malu [name]. ([id number]) Family and Social History Cancer: Yes - Mother; Diabetes: Yes - Siblings; Heart Disease: Yes - Father,Siblings; Hereditary Spherocytosis: No; Hypertension: Yes - Siblings; Kidney Disease: Yes - Siblings; Lung Disease: No; Seizures: No; Never smoker; Marital Status - Married; Alcohol Use: Never; Drug Use: No History; Caffeine Use: Rarely; Financial Concerns: No; Food, Clothing or Shelter Needs: No; Support System Lacking: No; Transportation Concerns: No; Advanced Directives: Yes (Not Provided); Patient does not want information on Advanced Directives; Do not resuscitate: Yes (Not Provided); Living Will: Yes (Not Provided); Medical Power of Attorney: Yes (Not Provided) Physician Affirmation I have reviewed and agree with the above information. Electronic Signature(s) Signed: 04/11/2018 3:08:04 PM By: Montey Hora Signed: 04/11/2018 3:18:49 PM By: Worthy Keeler PA-[name] Entered By: Worthy Keeler on 04/11/2018 13:37:54 Sanda, Pavielle [name]. ([id number]) -------------------------------------------------------------------------------- SuperBill Details Patient Name: Nanni, Yariah [name]. Date of Service: 04/11/2018 Medical Record Number: [id number] Patient Account Number: [id number] Date of Birth/Sex: 09/01/1931 (85 y.o. F) Treating RN: Montey Hora Primary Care Provider: Harrel Lemon Other Clinician: Referring Provider: Harrel Lemon Treating Provider/Extender: Melburn Hake, Trevaughn Schear Weeks in Treatment: 12 Diagnosis Coding ICD-10 Codes Code Description [id number] Non-pressure chronic ulcer of other part of right lower leg with fat layer exposed L97.822 Non-pressure chronic ulcer of other part of left lower leg with fat layer exposed S81.802A Unspecified open wound, left lower leg, initial encounter S81.801A Unspecified open wound, right lower leg, initial encounter I48.0 Paroxysmal atrial  fibrillation Z79.01 Long term (current) use of anticoagulants N18.3 Chronic kidney disease, stage 3 (moderate) Facility Procedures CPT4 Code: [id number] Description: 99213 - WOUND CARE VISIT-LEV 3 EST PT Modifier: Quantity: 1 Physician Procedures CPT4 Code Description: 6314970 99214 - WC PHYS LEVEL 4 - EST PT ICD-10 Diagnosis Description Y63.785 Non-pressure chronic ulcer of other part of right lower leg w L97.822 Non-pressure chronic ulcer of other part of left lower leg wi S81.802A Unspecified  open wound, left lower leg, initial encounter S81.801A Unspecified open wound, right lower leg, initial encounter Modifier: ith fat layer expo th fat layer expos Quantity: 1 sed ed Electronic Signature(s) Signed: 04/11/2018 3:18:49 PM By: Worthy Keeler PA-[name] Entered By: Worthy Keeler on 04/11/2018 13:38:44

## 2018-04-11 NOTE — Progress Notes (Signed)
Elizabeth, [name] ([id number]) Visit Report for 04/11/2018 Arrival Information Details Patient Name: [name] Date of Service: 04/11/2018 10:45 AM Medical Record Number: [id number] Patient Account Number: [id number] Date of Birth/Sex: 11/14/1931 (86 y.o. F) Treating RN: Army Melia Primary Care Conway Fedora: Harrel Lemon Other Clinician: Referring Durenda Pechacek: Harrel Lemon Treating Makaylynn Bonillas/Extender: Melburn Hake, HOYT Weeks in Treatment: 12 Visit Information History Since Last Visit Added or deleted any medications: No Patient Arrived: Ambulatory Any new allergies or adverse reactions: No Arrival Time: 11:19 Had a fall or experienced change in No Accompanied By: self activities of daily living that may affect Transfer Assistance: None risk of falls: Signs or symptoms of abuse/neglect since last visito No Hospitalized since last visit: No Implantable device outside of the clinic excluding No cellular tissue based products placed in the center since last visit: Has Dressing in Place as Prescribed: Yes Pain Present Now: No Electronic Signature(s) Signed: 04/11/2018 2:07:05 PM By: Army Melia Entered By: Army Melia on 04/11/2018 11:19:42 [name], [name] ([id number]) -------------------------------------------------------------------------------- Clinic Level of Care Assessment Details Patient Name: [name] Date of Service: 04/11/2018 10:45 AM Medical Record Number: [id number] Patient Account Number: [id number] Date of Birth/Sex: March 12, 1931 (86 y.o. F) Treating RN: Montey Hora Primary Care Fayne Mcguffee: Harrel Lemon Other Clinician: Referring Reza Crymes: Harrel Lemon Treating Jailin Manocchio/Extender: Melburn Hake, HOYT Weeks in Treatment: 12 Clinic Level of Care Assessment Items TOOL 4 Quantity Score []  - Use when only an EandM is performed on FOLLOW-UP visit 0 ASSESSMENTS - Nursing Assessment / Reassessment X - Reassessment of Co-morbidities (includes updates in patient status) 1 10 X-  1 5 Reassessment of Adherence to Treatment Plan ASSESSMENTS - Wound and Skin Assessment / Reassessment X - Simple Wound Assessment / Reassessment - one wound 1 5 []  - 0 Complex Wound Assessment / Reassessment - multiple wounds []  - 0 Dermatologic / Skin Assessment (not related to wound area) ASSESSMENTS - Focused Assessment X - Circumferential Edema Measurements - multi extremities 1 5 []  - 0 Nutritional Assessment / Counseling / Intervention X- 1 5 Lower Extremity Assessment (monofilament, tuning fork, pulses) []  - 0 Peripheral Arterial Disease Assessment (using hand held doppler) ASSESSMENTS - Ostomy and/or Continence Assessment and Care []  - Incontinence Assessment and Management 0 []  - 0 Ostomy Care Assessment and Management (repouching, etc.) PROCESS - Coordination of Care X - Simple Patient / Family Education for ongoing care 1 15 []  - 0 Complex (extensive) Patient / Family Education for ongoing care X- 1 10 Staff obtains Programmer, systems, Records, Test Results / Process Orders []  - 0 Staff telephones HHA, Nursing Homes / Clarify orders / etc []  - 0 Routine Transfer to another Facility (non-emergent condition) []  - 0 Routine Hospital Admission (non-emergent condition) []  - 0 New Admissions / Biomedical engineer / Ordering NPWT, Apligraf, etc. []  - 0 Emergency Hospital Admission (emergent condition) X- 1 10 Simple Discharge Coordination [name], [name] ([id number]) []  - 0 Complex (extensive) Discharge Coordination PROCESS - Special Needs []  - Pediatric / Minor Patient Management 0 []  - 0 Isolation Patient Management []  - 0 Hearing / Language / Visual special needs []  - 0 Assessment of Community assistance (transportation, D/[name] planning, etc.) []  - 0 Additional assistance / Altered mentation []  - 0 Support Surface(s) Assessment (bed, cushion, seat, etc.) INTERVENTIONS - Wound Cleansing / Measurement X - Simple Wound Cleansing - one wound 1 5 []  - 0 Complex Wound  Cleansing - multiple wounds X- 1 5 Wound Imaging (photographs - any number of wounds) []  -  0 Wound Tracing (instead of photographs) X- 1 5 Simple Wound Measurement - one wound []  - 0 Complex Wound Measurement - multiple wounds INTERVENTIONS - Wound Dressings X - Small Wound Dressing one or multiple wounds 1 10 []  - 0 Medium Wound Dressing one or multiple wounds []  - 0 Large Wound Dressing one or multiple wounds []  - 0 Application of Medications - topical []  - 0 Application of Medications - injection INTERVENTIONS - Miscellaneous []  - External ear exam 0 []  - 0 Specimen Collection (cultures, biopsies, blood, body fluids, etc.) []  - 0 Specimen(s) / Culture(s) sent or taken to Lab for analysis []  - 0 Patient Transfer (multiple staff / Civil Service fast streamer / Similar devices) []  - 0 Simple Staple / Suture removal (25 or less) []  - 0 Complex Staple / Suture removal (26 or more) []  - 0 Hypo / Hyperglycemic Management (close monitor of Blood Glucose) []  - 0 Ankle / Brachial Index (ABI) - do not check if billed separately X- 1 5 Vital Signs [name], [name] ([id number]) Has the patient been seen at the hospital within the last three years: Yes Total Score: 95 Level Of Care: New/Established - Level 3 Electronic Signature(s) Signed: 04/11/2018 3:08:04 PM By: Montey Hora Entered By: Montey Hora on 04/11/2018 11:44:48 [name] ([id number]) -------------------------------------------------------------------------------- Encounter Discharge Information Details Patient Name: [name] Date of Service: 04/11/2018 10:45 AM Medical Record Number: [id number] Patient Account Number: [id number] Date of Birth/Sex: 03-22-1931 (86 y.o. F) Treating RN: Harold Barban Primary Care Babacar Haycraft: Harrel Lemon Other Clinician: Referring Koltyn Kelsay: Harrel Lemon Treating Saskia Simerson/Extender: Melburn Hake, HOYT Weeks in Treatment: 12 Encounter Discharge Information Items Discharge Condition:  Stable Ambulatory Status: Ambulatory Discharge Destination: Home Transportation: Private Auto Accompanied By: self Schedule Follow-up Appointment: Yes Clinical Summary of Care: Electronic Signature(s) Signed: 04/11/2018 1:55:00 PM By: Harold Barban Entered By: Harold Barban on 04/11/2018 13:55:00 [name], [name] ([id number]) -------------------------------------------------------------------------------- Lower Extremity Assessment Details Patient Name: Skaff, Jimeka [name]. Date of Service: 04/11/2018 10:45 AM Medical Record Number: [id number] Patient Account Number: [id number] Date of Birth/Sex: 20-Mar-1931 (86 y.o. F) Treating RN: Army Melia Primary Care Zealand Boyett: Harrel Lemon Other Clinician: Referring Klark Vanderhoef: Harrel Lemon Treating Makelle Marrone/Extender: Melburn Hake, HOYT Weeks in Treatment: 12 Edema Assessment Assessed: [Left: No] [Right: No] Edema: [Left: N] [Right: o] Vascular Assessment Pulses: Dorsalis Pedis Palpable: [Right:Yes] Extremity colors, hair growth, and conditions: Extremity Color: [Right:Hyperpigmented] Hair Growth on Extremity: [Right:No] Temperature of Extremity: [Right:Warm < 3 seconds] Toe Nail Assessment Left: Right: Thick: No Discolored: No Deformed: No Improper Length and Hygiene: No Electronic Signature(s) Signed: 04/11/2018 2:07:05 PM By: Army Melia Entered By: Army Melia on 04/11/2018 11:21:13 [name], [name] ([id number]) -------------------------------------------------------------------------------- Multi Wound Chart Details Patient Name: Piche, Zoua [name]. Date of Service: 04/11/2018 10:45 AM Medical Record Number: [id number] Patient Account Number: [id number] Date of Birth/Sex: 06-23-1931 (86 y.o. F) Treating RN: Montey Hora Primary Care Jaydn Moscato: Harrel Lemon Other Clinician: Referring Demetrion Wesby: Harrel Lemon Treating Christyl Osentoski/Extender: Melburn Hake, HOYT Weeks in Treatment: 12 Vital Signs Height(in): 61 Pulse(bpm): 81 Weight(lbs):  100 Blood Pressure(mmHg): 113/52 Body Mass Index(BMI): 19 Temperature(F): 97.6 Respiratory Rate 16 (breaths/min): Photos: [1:No Photos] [N/A:N/A] Wound Location: [1:Right Lower Leg - Anterior] [N/A:N/A] Wounding Event: [1:Trauma] [N/A:N/A] Primary Etiology: [1:Trauma, Other] [N/A:N/A] Comorbid History: [1:Congestive Heart Failure, Coronary Artery Disease, End Stage Renal Disease] [N/A:N/A] Date Acquired: [1:12/26/2017] [N/A:N/A] Weeks of Treatment: [1:12] [N/A:N/A] Wound Status: [1:Open] [N/A:N/A] Measurements L x W x D [1:0.3x0.3x0.1] [N/A:N/A] (cm) Area (cm) : [1:0.071] [N/A:N/A] Volume (cm) : [1:0.007] [N/A:N/A] %  Reduction in Area: [1:97.30%] [N/A:N/A] % Reduction in Volume: [1:97.30%] [N/A:N/A] Classification: [1:Full Thickness Without Exposed Support Structures] [N/A:N/A] Exudate Amount: [1:Small] [N/A:N/A] Exudate Type: [1:Serous] [N/A:N/A] Exudate Color: [1:amber] [N/A:N/A] Wound Margin: [1:Flat and Intact] [N/A:N/A] Granulation Amount: [1:Medium (34-66%)] [N/A:N/A] Granulation Quality: [1:Red, Pink] [N/A:N/A] Necrotic Amount: [1:Small (1-33%)] [N/A:N/A] Exposed Structures: [1:Fat Layer (Subcutaneous Tissue) Exposed: Yes Fascia: No Tendon: No Muscle: No Joint: No Bone: No] [N/A:N/A] Epithelialization: [1:None] [N/A:N/A] Periwound Skin Texture: [1:Excoriation: No Induration: No Callus: No Crepitus: No] [N/A:N/A] Rash: No Scarring: No Periwound Skin Moisture: Maceration: No N/A N/A Dry/Scaly: No Periwound Skin Color: Hemosiderin Staining: Yes N/A N/A Atrophie Blanche: No Cyanosis: No Ecchymosis: No Erythema: No Mottled: No Pallor: No Rubor: No Temperature: No Abnormality N/A N/A Tenderness on Palpation: Yes N/A N/A Wound Preparation: Ulcer Cleansing: N/A N/A Rinsed/Irrigated with Saline Topical Anesthetic Applied: Other: lidocaine 4% Treatment Notes Electronic Signature(s) Signed: 04/11/2018 3:08:04 PM By: Montey Hora Entered By: Montey Hora  on 04/11/2018 11:42:30 [name], [name] ([id number]) -------------------------------------------------------------------------------- [place] Details Patient Name: [name], [name] Date of Service: 04/11/2018 10:45 AM Medical Record Number: [id number] Patient Account Number: [id number] Date of Birth/Sex: July 11, 1931 (86 y.o. F) Treating RN: Montey Hora Primary Care Sparkles Mcneely: Harrel Lemon Other Clinician: Referring Mica Ramdass: Harrel Lemon Treating Harvey Matlack/Extender: Melburn Hake, HOYT Weeks in Treatment: 12 Active Inactive Abuse / Safety / Falls / Self Care Management Nursing Diagnoses: Potential for falls Goals: Patient will not experience any injury related to falls Date Initiated: 01/17/2018 Target Resolution Date: 04/12/2018 Goal Status: Active Interventions: Assess fall risk on admission and as needed Notes: Orientation to the Wound Care Program Nursing Diagnoses: Knowledge deficit related to the wound healing center program Goals: Patient/caregiver will verbalize understanding of the [place] Program Date Initiated: 01/17/2018 Target Resolution Date: 04/12/2018 Goal Status: Active Interventions: Provide education on orientation to the wound center Notes: Wound/Skin Impairment Nursing Diagnoses: Impaired tissue integrity Goals: Ulcer/skin breakdown will heal within 14 weeks Date Initiated: 01/17/2018 Target Resolution Date: 04/12/2018 Goal Status: Active Interventions: Assess patient/caregiver ability to obtain necessary supplies Loftus, [name] ([id number]) Assess patient/caregiver ability to perform ulcer/skin care regimen upon admission and as needed Assess ulceration(s) every visit Notes: Electronic Signature(s) Signed: 04/11/2018 3:08:04 PM By: Montey Hora Entered By: Montey Hora on 04/11/2018 11:42:20 Fullam, Sandeep [name]. ([id number]) -------------------------------------------------------------------------------- Pain Assessment  Details Patient Name: [name], [name] Date of Service: 04/11/2018 10:45 AM Medical Record Number: [id number] Patient Account Number: [id number] Date of Birth/Sex: 20-Oct-1931 (86 y.o. F) Treating RN: Army Melia Primary Care Juana Haralson: Harrel Lemon Other Clinician: Referring Evangaline Jou: Harrel Lemon Treating Shahidah Nesbitt/Extender: Melburn Hake, HOYT Weeks in Treatment: 12 Active Problems Location of Pain Severity and Description of Pain Patient Has Paino No Site Locations Pain Management and Medication Current Pain Management: Electronic Signature(s) Signed: 04/11/2018 2:07:05 PM By: Army Melia Entered By: Army Melia on 04/11/2018 11:19:50 Ranker, Azile Loletha Grayer ([id number]) -------------------------------------------------------------------------------- Patient/Caregiver Education Details Patient Name: [name], [name] Date of Service: 04/11/2018 10:45 AM Medical Record Number: [id number] Patient Account Number: [id number] Date of Birth/Gender: February 17, 1931 (85 y.o. F) Treating RN: Montey Hora Primary Care Physician: Harrel Lemon Other Clinician: Referring Physician: Harrel Lemon Treating Physician/Extender: Sharalyn Ink in Treatment: 12 Education Assessment Education Provided To: Patient Education Topics Provided Wound/Skin Impairment: Handouts: Other: wound care as ordered Methods: Demonstration, Explain/Verbal Responses: State content correctly Electronic Signature(s) Signed: 04/11/2018 3:08:04 PM By: Montey Hora Entered By: Montey Hora on 04/11/2018 11:45:06 [name], [name] ([id number]) -------------------------------------------------------------------------------- Wound Assessment Details Patient Name: [name], [name]  Date of Service: 04/11/2018 10:45 AM Medical Record Number: [id number] Patient Account Number: [id number] Date of Birth/Sex: 1931/04/02 (86 y.o. F) Treating RN: Army Melia Primary Care Xavior Niazi: Harrel Lemon Other Clinician: Referring Varie Machamer:  Harrel Lemon Treating Roseanna Koplin/Extender: Melburn Hake, HOYT Weeks in Treatment: 12 Wound Status Wound Number: 1 Primary Trauma, Other Etiology: Wound Location: Right Lower Leg - Anterior Wound Open Wounding Event: Trauma Status: Date Acquired: 12/26/2017 Comorbid Congestive Heart Failure, Coronary Artery Weeks Of Treatment: 12 History: Disease, End Stage Renal Disease Clustered Wound: No Photos Photo Uploaded By: Army Melia on 04/11/2018 11:48:41 Wound Measurements Length: (cm) 0.3 Width: (cm) 0.3 Depth: (cm) 0.1 Area: (cm) 0.071 Volume: (cm) 0.007 % Reduction in Area: 97.3% % Reduction in Volume: 97.3% Epithelialization: None Tunneling: No Undermining: No Wound Description Full Thickness Without Exposed Support Classification: Structures Wound Margin: Flat and Intact Exudate Small Amount: Exudate Type: Serous Exudate Color: amber Foul Odor After Cleansing: No Slough/Fibrino Yes Wound Bed Granulation Amount: Medium (34-66%) Exposed Structure Granulation Quality: Red, Pink Fascia Exposed: No Necrotic Amount: Small (1-33%) Fat Layer (Subcutaneous Tissue) Exposed: Yes Necrotic Quality: Adherent Slough Tendon Exposed: No Muscle Exposed: No Joint Exposed: No Bone Exposed: No [name], [name] ([id number]) Periwound Skin Texture Texture Color No Abnormalities Noted: No No Abnormalities Noted: No Callus: No Atrophie Blanche: No Crepitus: No Cyanosis: No Excoriation: No Ecchymosis: No Induration: No Erythema: No Rash: No Hemosiderin Staining: Yes Scarring: No Mottled: No Pallor: No Moisture Rubor: No No Abnormalities Noted: No Dry / Scaly: No Temperature / Pain Maceration: No Temperature: No Abnormality Tenderness on Palpation: Yes Wound Preparation Ulcer Cleansing: Rinsed/Irrigated with Saline Topical Anesthetic Applied: Other: lidocaine 4%, Treatment Notes Wound #1 (Right, Anterior Lower Leg) Notes Silver collagen, non-adherent, conform on  right, Electronic Signature(s) Signed: 04/11/2018 2:07:05 PM By: Army Melia Entered By: Army Melia on 04/11/2018 11:20:54 Stroh, Kerri-Anne [name]. ([id number]) -------------------------------------------------------------------------------- Vitals Details Patient Name: Postel, Jaedyn [name]. Date of Service: 04/11/2018 10:45 AM Medical Record Number: [id number] Patient Account Number: [id number] Date of Birth/Sex: [birth year] (86 y.o. F) Treating RN: Army Melia Primary Care Gevin Perea: Harrel Lemon Other Clinician: Referring Elijah Michaelis: Harrel Lemon Treating Kimber Fritts/Extender: Melburn Hake, HOYT Weeks in Treatment: 12 Vital Signs Time Taken: 11:19 Temperature (F): 97.6 Height (in): 61 Pulse (bpm): 81 Weight (lbs): 100 Respiratory Rate (breaths/min): 16 Body Mass Index (BMI): 18.9 Blood Pressure (mmHg): 113/52 Reference Range: 80 - 120 mg / dl Electronic Signature(s) Signed: 04/11/2018 2:07:05 PM By: Army Melia Entered By: Army Melia on 04/11/2018 11:20:15

## 2018-04-18 ENCOUNTER — Other Ambulatory Visit: Payer: Self-pay

## 2018-04-18 ENCOUNTER — Encounter: Payer: PPO | Admitting: Physician Assistant

## 2018-04-18 DIAGNOSIS — L97829 Non-pressure chronic ulcer of other part of left lower leg with unspecified severity: Secondary | ICD-10-CM | POA: Diagnosis not present

## 2018-04-18 DIAGNOSIS — L97819 Non-pressure chronic ulcer of other part of right lower leg with unspecified severity: Secondary | ICD-10-CM | POA: Diagnosis not present

## 2018-04-18 DIAGNOSIS — L97822 Non-pressure chronic ulcer of other part of left lower leg with fat layer exposed: Secondary | ICD-10-CM | POA: Diagnosis not present

## 2018-04-18 DIAGNOSIS — Z1231 Encounter for screening mammogram for malignant neoplasm of breast: Secondary | ICD-10-CM | POA: Diagnosis not present

## 2018-04-18 NOTE — Progress Notes (Signed)
TANVEER, DOBBERSTEIN ([id number]) Visit Report for 04/18/2018 Chief Complaint Document Details Patient Name: Elizabeth Fields, [name] Date of Service: 04/18/2018 1:00 PM Medical Record Number: [id number] Patient Account Number: [id number] Date of Birth/Sex: May 30, 1931 (86 y.o. F) Treating RN: Montey Hora Primary Care Provider: Harrel Lemon Other Clinician: Referring Provider: Harrel Lemon Treating Provider/Extender: Melburn Hake, HOYT Weeks in Treatment: 13 Information Obtained from: Patient Chief Complaint Bilateral LE Ulcers Electronic Signature(s) Signed: 04/18/2018 2:32:34 PM By: Worthy Keeler PA-C Entered By: Worthy Keeler on 04/18/2018 12:59:26 Elizabeth Fields, [name] ([id number]) -------------------------------------------------------------------------------- HPI Details Patient Name: Elizabeth Fields, [name] Date of Service: 04/18/2018 1:00 PM Medical Record Number: [id number] Patient Account Number: [id number] Date of Birth/Sex: 09/05/1931 (86 y.o. F) Treating RN: Montey Hora Primary Care Provider: Harrel Lemon Other Clinician: Referring Provider: Harrel Lemon Treating Provider/Extender: Melburn Hake, HOYT Weeks in Treatment: 13 History of Present Illness HPI Description: 01/17/18 on evaluation today patient presents for evaluation of two ulcers both on the anterior portion of her lower extremities one right and one left. The right she has had for about three weeks the left for about eight weeks and both occurred as a result of her traumatizing her shins on the bottom of the card were trying to get out of the car. She states this is never happen with any other car doors before but she has been having some issues with this sharp area on the store in particular. Both occurred in the similar situation where she was trying not to open the door the whole way in order to avoid damaging anybody else's car beside them. She is been using peroxide initially to clean the wound although she has converted to the  wound cleanser now. She has been using if anything just over the counter triple antibiotic ointment and a Band-Aid. With that being said she's not even been using that more recently. The most part she's just been attempting allow this to dry out/scab over. Her APIs were greater than 220 and noncompressible. Patient does have a history of paroxysmal atrial fibrillation for which she is on Coumadin. She also has chronic kidney disease stage III. She has been on antibiotics that I see no evidence of infection right now which is good news. 01/24/18 on evaluation today patient appears to be doing excellent in regard to her lower extremity ulcers. She has been tolerating the dressing changes without complication which is excellent news. Overall I have been very pleased with her progress. Nonetheless she still has have some ways to go before this will be completely healed. The left is a little bit better in appearance compared to the right which is somewhat deeper. 01/31/18 on evaluation today patient appears to be doing better in regard to her bilateral anterior lower extremity ulcers. She has been tolerating the dressing changes without complication which is excellent news. Fortunately there does not appear to be any evidence of infection at this time which is also good news. I'm very pleased with how things seem to be progressing at this point. The patient likewise is very happy and states she's not having the pain that she was having previous. 02/07/18 on evaluation today patient appears to be doing rather well in regard to her ulcers. The left especially seems to be making good progress in the right does have more slough and poor granulation tissue on the surface at this point. I think this is gonna require little bit more extensive debridement. Fortunately there's no signs of infection at this time. 02/14/18  on evaluation today patient appears to be doing better in the overall appearance of the wounds  although she tells me she's been having more burning over the past week. There appears to be some adhesive irritation. That has me somewhat concerned as far as the reason for the burning is concerned. With that being said I do not see any signs of actual infection which is good news. No fevers, chills, nausea, or vomiting noted at this time. Overall there does not appear to be any sign that there is worsening to me and the only reason the right extremity wound is bigger is that I actually performed a fairly significant debridement last week. That maybe some of the reason why she's had a little bit more pain as well. 02/21/18 on evaluation today patient appears to be doing poorly in regard to her lower extremity ulcers unfortunately. She continues to have discomfort despite the dressing switch I don't think the dressing is the issue I believe she may actually have an infection and that could be the problem that we're facing at this point. Fortunately there's no signs of systemic infection. No fevers, chills, nausea, or vomiting noted at this time. Unfortunately I'm just not seeing the progress that I would expect especially after cleaning the wounds off sufficiently. 02/28/18 on evaluation today patient appears to be doing a little better in regard to her bilateral anterior lower Trinity ulcer. She still has some inflammation surrounding the wound but not nearly as bad as last week. Her culture did come back showing positive for Pseudomonas as the organism likely responsible for the infection. For that reason I did stop the doxycycline sent in a prescription for Levaquin for her today. Other than that and pleased with how things seem to be progressing I do believe the alginate have been better for her. 03/07/18 on evaluation today patient actually appears to be doing very well in regard to her bilateral lower Trinity ulcer's. In fact she seems to be showing signs of excellent improvement I do believe  that the Levaquin we prescribed has been of benefit for Googe, [name] ([id number]) her the infection appears to be clearing quite nicely. I'm very pleased in that regard. Her pain is also much less severe compared to what it was previous. 03/14/18 on evaluation today patient actually appears to be doing much better in regard to her bilateral lower is from the ulcers. In fact she is appearing to almost be if not completely healed in regard to left lower extremity. Parts of the right lower extremity this is significantly improved although not completely healed. Fortunately there's no signs of active infection at this time I believe we may finally have got not under control which is excellent. 03/21/18 on evaluation today patient actually appears to be doing much better in regard to her bilateral lower extremity ulcer. The left ulcer still show signs of being covered with a dry eschar although it appears this is likely healed underneath I could not quite get this off easily and therefore I'm more apt to leave it in place until it starts to loosen up a little bit more than its own. In regard to the right it appears that this is doing much better there's more granulation as well as epithelialization noted and she's not having any pain like what she was previous. 03/28/18 on evaluation today patient actually appears to be doing very well in regard to her bilateral lower extremity ulcer's. In fact the left leg ulcer is completely  healed at this point. The right leg ulcer is doing much better and show signs of excellent improvement although it's not healed yet. I do feel like she's making good progress. 04/04/18 on evaluation today patient appears to be doing very well in regard to her right anterior lower extremity ulcer. She's been tolerating the dressing changes without complication. Fortunately for wound bed is appearing excellent as far as the overall appearance is concerned today. 04/11/18 on evaluation  today patient's right lower extremity appears to almost be completely healed. She's been tolerating the dressing changes without complication which is excellent news. Overall very pleased with the progression at this point. In fact she has just a very small opening remaining on the anterior right lower extremity. She is having no discomfort. 04/18/18 upon evaluation today patient appears to be doing excellent in regard to her bilateral lower extremity ulcers the right ulcer is now completely healed which means she is doing very well and has no further open wounds noted at this point. She's very pleased to hear this. Electronic Signature(s) Signed: 04/18/2018 2:32:34 PM By: Worthy Keeler PA-C Entered By: Worthy Keeler on 04/18/2018 13:32:05 Elizabeth Fields, Elizabeth Fields Kitchen ([id number]) -------------------------------------------------------------------------------- Physical Exam Details Patient Name: Starkey, Taneesha C. Date of Service: 04/18/2018 1:00 PM Medical Record Number: [id number] Patient Account Number: [id number] Date of Birth/Sex: 1931-02-20 (86 y.o. F) Treating RN: Montey Hora Primary Care Provider: Harrel Lemon Other Clinician: Referring Provider: Harrel Lemon Treating Provider/Extender: Melburn Hake, HOYT Weeks in Treatment: 57 Constitutional Well-nourished and well-hydrated in no acute distress. Respiratory normal breathing without difficulty. Psychiatric this patient is able to make decisions and demonstrates good insight into disease process. Alert and Oriented x 3. pleasant and cooperative. Notes Patient's wound bed currently showed signs of good epithelialization upon evaluation today and overall has been doing very well. I'm pleased with the progress that has been made in the fact that she is completely healed is excellent news. Electronic Signature(s) Signed: 04/18/2018 2:32:34 PM By: Worthy Keeler PA-C Entered By: Worthy Keeler on 04/18/2018 13:32:36 Elizabeth Fields, Elizabeth Fields  ([id number]) -------------------------------------------------------------------------------- Physician Orders Details Patient Name: Stoneham, Yazlynn C. Date of Service: 04/18/2018 1:00 PM Medical Record Number: [id number] Patient Account Number: [id number] Date of Birth/Sex: 1931/09/09 (86 y.o. F) Treating RN: Montey Hora Primary Care Provider: Harrel Lemon Other Clinician: Referring Provider: Harrel Lemon Treating Provider/Extender: Melburn Hake, HOYT Weeks in Treatment: 79 Verbal / Phone Orders: No Diagnosis Coding ICD-10 Coding Code Description [phone] Non-pressure chronic ulcer of other part of right lower leg with fat layer exposed L97.822 Non-pressure chronic ulcer of other part of left lower leg with fat layer exposed S81.802A Unspecified open wound, left lower leg, initial encounter S81.801A Unspecified open wound, right lower leg, initial encounter I48.0 Paroxysmal atrial fibrillation Z79.01 Long term (current) use of anticoagulants N18.3 Chronic kidney disease, stage 3 (moderate) Discharge From [facility] o Discharge from [place] Signature(s) Signed: 04/18/2018 2:32:34 PM By: Worthy Keeler PA-C Entered By: Worthy Keeler on 04/18/2018 13:33:07 Mccaughey, Brooklee C. ([id number]) -------------------------------------------------------------------------------- Problem List Details Patient Name: Boldon, Tatanisha C. Date of Service: 04/18/2018 1:00 PM Medical Record Number: [id number] Patient Account Number: [id number] Date of Birth/Sex: 1931-04-22 (86 y.o. F) Treating RN: Montey Hora Primary Care Provider: Harrel Lemon Other Clinician: Referring Provider: Harrel Lemon Treating Provider/Extender: Melburn Hake, HOYT Weeks in Treatment: 62 Active Problems ICD-10 Evaluated Encounter Code Description Active Date Today Diagnosis [phone] Non-pressure chronic ulcer of other part of right lower leg 01/17/2018 No Yes  with fat layer exposed L97.822 Non-pressure  chronic ulcer of other part of left lower leg with 01/17/2018 No Yes fat layer exposed S81.802A Unspecified open wound, left lower leg, initial encounter 01/17/2018 No Yes S81.801A Unspecified open wound, right lower leg, initial encounter 01/17/2018 No Yes I48.0 Paroxysmal atrial fibrillation 01/17/2018 No Yes Z79.01 Long term (current) use of anticoagulants 01/17/2018 No Yes N18.3 Chronic kidney disease, stage 3 (moderate) 01/17/2018 No Yes Inactive Problems Resolved Problems Electronic Signature(s) Signed: 04/18/2018 2:32:34 PM By: Worthy Keeler PA-C Entered By: Worthy Keeler on 04/18/2018 12:58:42 Elizabeth Fields, [name] ([id number]) -------------------------------------------------------------------------------- Progress Note Details Patient Name: Elizabeth Fields, [name] Date of Service: 04/18/2018 1:00 PM Medical Record Number: [id number] Patient Account Number: [id number] Date of Birth/Sex: 02-Feb-1931 (86 y.o. F) Treating RN: Montey Hora Primary Care Provider: Harrel Lemon Other Clinician: Referring Provider: Harrel Lemon Treating Provider/Extender: Melburn Hake, HOYT Weeks in Treatment: 13 Subjective Chief Complaint Information obtained from Patient Bilateral LE Ulcers History of Present Illness (HPI) 01/17/18 on evaluation today patient presents for evaluation of two ulcers both on the anterior portion of her lower extremities one right and one left. The right she has had for about three weeks the left for about eight weeks and both occurred as a result of her traumatizing her shins on the bottom of the card were trying to get out of the car. She states this is never happen with any other car doors before but she has been having some issues with this sharp area on the store in particular. Both occurred in the similar situation where she was trying not to open the door the whole way in order to avoid damaging anybody else's car beside them. She is been using peroxide initially to clean the wound  although she has converted to the wound cleanser now. She has been using if anything just over the counter triple antibiotic ointment and a Band-Aid. With that being said she's not even been using that more recently. The most part she's just been attempting allow this to dry out/scab over. Her APIs were greater than 220 and noncompressible. Patient does have a history of paroxysmal atrial fibrillation for which she is on Coumadin. She also has chronic kidney disease stage III. She has been on antibiotics that I see no evidence of infection right now which is good news. 01/24/18 on evaluation today patient appears to be doing excellent in regard to her lower extremity ulcers. She has been tolerating the dressing changes without complication which is excellent news. Overall I have been very pleased with her progress. Nonetheless she still has have some ways to go before this will be completely healed. The left is a little bit better in appearance compared to the right which is somewhat deeper. 01/31/18 on evaluation today patient appears to be doing better in regard to her bilateral anterior lower extremity ulcers. She has been tolerating the dressing changes without complication which is excellent news. Fortunately there does not appear to be any evidence of infection at this time which is also good news. I'm very pleased with how things seem to be progressing at this point. The patient likewise is very happy and states she's not having the pain that she was having previous. 02/07/18 on evaluation today patient appears to be doing rather well in regard to her ulcers. The left especially seems to be making good progress in the right does have more slough and poor granulation tissue on the surface at this point. I think this is  gonna require little bit more extensive debridement. Fortunately there's no signs of infection at this time. 02/14/18 on evaluation today patient appears to be doing better in the  overall appearance of the wounds although she tells me she's been having more burning over the past week. There appears to be some adhesive irritation. That has me somewhat concerned as far as the reason for the burning is concerned. With that being said I do not see any signs of actual infection which is good news. No fevers, chills, nausea, or vomiting noted at this time. Overall there does not appear to be any sign that there is worsening to me and the only reason the right extremity wound is bigger is that I actually performed a fairly significant debridement last week. That maybe some of the reason why she's had a little bit more pain as well. 02/21/18 on evaluation today patient appears to be doing poorly in regard to her lower extremity ulcers unfortunately. She continues to have discomfort despite the dressing switch I don't think the dressing is the issue I believe she may actually have an infection and that could be the problem that we're facing at this point. Fortunately there's no signs of systemic infection. No fevers, chills, nausea, or vomiting noted at this time. Unfortunately I'm just not seeing the progress that I would expect especially after cleaning the wounds off sufficiently. 02/28/18 on evaluation today patient appears to be doing a little better in regard to her bilateral anterior lower Trinity ulcer. She still has some inflammation surrounding the wound but not nearly as bad as last week. Her culture did come back showing Santaella, Ferrin C. ([id number]) positive for Pseudomonas as the organism likely responsible for the infection. For that reason I did stop the doxycycline sent in a prescription for Levaquin for her today. Other than that and pleased with how things seem to be progressing I do believe the alginate have been better for her. 03/07/18 on evaluation today patient actually appears to be doing very well in regard to her bilateral lower Trinity ulcer's. In fact she seems  to be showing signs of excellent improvement I do believe that the Levaquin we prescribed has been of benefit for her the infection appears to be clearing quite nicely. I'm very pleased in that regard. Her pain is also much less severe compared to what it was previous. 03/14/18 on evaluation today patient actually appears to be doing much better in regard to her bilateral lower is from the ulcers. In fact she is appearing to almost be if not completely healed in regard to left lower extremity. Parts of the right lower extremity this is significantly improved although not completely healed. Fortunately there's no signs of active infection at this time I believe we may finally have got not under control which is excellent. 03/21/18 on evaluation today patient actually appears to be doing much better in regard to her bilateral lower extremity ulcer. The left ulcer still show signs of being covered with a dry eschar although it appears this is likely healed underneath I could not quite get this off easily and therefore I'm more apt to leave it in place until it starts to loosen up a little bit more than its own. In regard to the right it appears that this is doing much better there's more granulation as well as epithelialization noted and she's not having any pain like what she was previous. 03/28/18 on evaluation today patient actually appears to be doing very  well in regard to her bilateral lower extremity ulcer's. In fact the left leg ulcer is completely healed at this point. The right leg ulcer is doing much better and show signs of excellent improvement although it's not healed yet. I do feel like she's making good progress. 04/04/18 on evaluation today patient appears to be doing very well in regard to her right anterior lower extremity ulcer. She's been tolerating the dressing changes without complication. Fortunately for wound bed is appearing excellent as far as the overall appearance is concerned  today. 04/11/18 on evaluation today patient's right lower extremity appears to almost be completely healed. She's been tolerating the dressing changes without complication which is excellent news. Overall very pleased with the progression at this point. In fact she has just a very small opening remaining on the anterior right lower extremity. She is having no discomfort. 04/18/18 upon evaluation today patient appears to be doing excellent in regard to her bilateral lower extremity ulcers the right ulcer is now completely healed which means she is doing very well and has no further open wounds noted at this point. She's very pleased to hear this. Patient History Information obtained from Patient. Family History Cancer - Mother, Diabetes - Siblings, Heart Disease - Father,Siblings, Hypertension - Siblings, Kidney Disease - Siblings, No family history of Hereditary Spherocytosis, Lung Disease, Seizures. Social History Never smoker, Marital Status - Married, Alcohol Use - Never, Drug Use - No History, Caffeine Use - Rarely. Medical History Eyes Denies history of Cataracts, Glaucoma, Optic Neuritis Ear/Nose/Mouth/Throat Denies history of Chronic sinus problems/congestion, Middle ear problems Hematologic/Lymphatic Denies history of Anemia, Hemophilia, Human Immunodeficiency Virus, Lymphedema, Sickle Cell Disease Respiratory Denies history of Aspiration, Asthma, Chronic Obstructive Pulmonary Disease (COPD), Pneumothorax, Sleep Apnea, Tuberculosis Cardiovascular Patient has history of Congestive Heart Failure, Coronary Artery Disease Denies history of Angina, Arrhythmia, Deep Vein Thrombosis, Hypertension, Hypotension, Myocardial Infarction, Peripheral Elizabeth Fields, [name] ([id number]) Arterial Disease, Peripheral Venous Disease, Phlebitis, Vasculitis Gastrointestinal Denies history of Cirrhosis , Colitis, Crohn s, Hepatitis A, Hepatitis B, Hepatitis C Endocrine Denies history of Type I Diabetes, Type  II Diabetes Genitourinary Patient has history of End Stage Renal Disease Immunological Denies history of Lupus Erythematosus, Raynaud s, Scleroderma Integumentary (Skin) Denies history of History of Burn, History of pressure wounds Musculoskeletal Denies history of Gout, Rheumatoid Arthritis, Osteoarthritis, Osteomyelitis Neurologic Denies history of Dementia, Neuropathy, Quadriplegia, Paraplegia Oncologic Denies history of Received Chemotherapy, Received Radiation Psychiatric Denies history of Anorexia/bulimia, Confinement Anxiety Review of Systems (ROS) Constitutional Symptoms (General Health) Denies complaints or symptoms of Fatigue, Fever, Chills, Marked Weight Change. Respiratory Denies complaints or symptoms of Chronic or frequent coughs, Shortness of Breath. Cardiovascular Denies complaints or symptoms of Chest pain, LE edema. Psychiatric Denies complaints or symptoms of Anxiety, Claustrophobia. Objective Constitutional Well-nourished and well-hydrated in no acute distress. Vitals Time Taken: 1:02 PM, Height: 61 in, Weight: 100 lbs, BMI: 18.9, Temperature: 97.8 F, Pulse: 88 bpm, Respiratory Rate: 16 breaths/min, Blood Pressure: 116/58 mmHg. Respiratory normal breathing without difficulty. Psychiatric this patient is able to make decisions and demonstrates good insight into disease process. Alert and Oriented x 3. pleasant and cooperative. General Notes: Patient's wound bed currently showed signs of good epithelialization upon evaluation today and overall has been doing very well. I'm pleased with the progress that has been made in the fact that she is completely healed is excellent news. Elizabeth Fields, [name] ([id number]) Integumentary (Hair, Skin) Wound #1 status is Healed - Epithelialized. Original cause of wound was Trauma. The wound  is located on the Right,Anterior Lower Leg. The wound measures 0cm length x 0cm width x 0cm depth; 0cm^2 area and 0cm^3 volume. There is no  tunneling or undermining noted. Assessment Active Problems ICD-10 Non-pressure chronic ulcer of other part of right lower leg with fat layer exposed Non-pressure chronic ulcer of other part of left lower leg with fat layer exposed Unspecified open wound, left lower leg, initial encounter Unspecified open wound, right lower leg, initial encounter Paroxysmal atrial fibrillation Long term (current) use of anticoagulants Chronic kidney disease, stage 3 (moderate) Plan Discharge From [facility] Services: Discharge from [place] My suggestion at this time is gonna be that we discontinue wound care services no further recommendations were made when necessary. Anything changes worsens in the future shall contact the office and let me know. Electronic Signature(s) Signed: 04/18/2018 2:32:34 PM By: Worthy Keeler PA-C Entered By: Worthy Keeler on 04/18/2018 13:33:14 Elizabeth Fields, Elizabeth Fields Kitchen ([id number]) -------------------------------------------------------------------------------- ROS/PFSH Details Patient Name: Elizabeth Fields, [name] Date of Service: 04/18/2018 1:00 PM Medical Record Number: [id number] Patient Account Number: [id number] Date of Birth/Sex: Mar 06, 1931 (86 y.o. F) Treating RN: Montey Hora Primary Care Provider: Harrel Lemon Other Clinician: Referring Provider: Harrel Lemon Treating Provider/Extender: Melburn Hake, HOYT Weeks in Treatment: 13 Information Obtained From Patient Constitutional Symptoms (General Health) Complaints and Symptoms: Negative for: Fatigue; Fever; Chills; Marked Weight Change Respiratory Complaints and Symptoms: Negative for: Chronic or frequent coughs; Shortness of Breath Medical History: Negative for: Aspiration; Asthma; Chronic Obstructive Pulmonary Disease (COPD); Pneumothorax; Sleep Apnea; Tuberculosis Cardiovascular Complaints and Symptoms: Negative for: Chest pain; LE edema Medical History: Positive for: Congestive Heart Failure; Coronary Artery  Disease Negative for: Angina; Arrhythmia; Deep Vein Thrombosis; Hypertension; Hypotension; Myocardial Infarction; Peripheral Arterial Disease; Peripheral Venous Disease; Phlebitis; Vasculitis Psychiatric Complaints and Symptoms: Negative for: Anxiety; Claustrophobia Medical History: Negative for: Anorexia/bulimia; Confinement Anxiety Eyes Medical History: Negative for: Cataracts; Glaucoma; Optic Neuritis Ear/Nose/Mouth/Throat Medical History: Negative for: Chronic sinus problems/congestion; Middle ear problems Hematologic/Lymphatic Medical History: Negative for: Anemia; Hemophilia; Human Immunodeficiency Virus; Lymphedema; Sickle Cell Disease Linskey, Maryruth C. ([id number]) Gastrointestinal Medical History: Negative for: Cirrhosis ; Colitis; Crohnos; Hepatitis A; Hepatitis B; Hepatitis C Endocrine Medical History: Negative for: Type I Diabetes; Type II Diabetes Genitourinary Medical History: Positive for: End Stage Renal Disease Immunological Medical History: Negative for: Lupus Erythematosus; Raynaudos; Scleroderma Integumentary (Skin) Medical History: Negative for: History of Burn; History of pressure wounds Musculoskeletal Medical History: Negative for: Gout; Rheumatoid Arthritis; Osteoarthritis; Osteomyelitis Neurologic Medical History: Negative for: Dementia; Neuropathy; Quadriplegia; Paraplegia Oncologic Medical History: Negative for: Received Chemotherapy; Received Radiation Immunizations Pneumococcal Vaccine: Received Pneumococcal Vaccination: Yes Implantable Devices No devices added Family and Social History Cancer: Yes - Mother; Diabetes: Yes - Siblings; Heart Disease: Yes - Father,Siblings; Hereditary Spherocytosis: No; Hypertension: Yes - Siblings; Kidney Disease: Yes - Siblings; Lung Disease: No; Seizures: No; Never smoker; Marital Status - Married; Alcohol Use: Never; Drug Use: No History; Caffeine Use: Rarely; Financial Concerns: No; Food, Clothing  or Shelter Needs: No; Support System Lacking: No; Transportation Concerns: No Physician Affirmation I have reviewed and agree with the above information. Electronic Signature(s) Signed: 04/18/2018 2:25:55 PM By: Montey Hora Signed: 04/18/2018 2:32:34 PM By: Cephas Darby, Neave C. ([id number]) Entered By: Worthy Keeler on 04/18/2018 13:32:25 Majewski, Kashmere C. ([id number]) -------------------------------------------------------------------------------- SuperBill Details Patient Name: Decoster, Janace C. Date of Service: 04/18/2018 Medical Record Number: [id number] Patient Account Number: [id number] Date of Birth/Sex: 1932/01/02 (85 y.o. F) Treating RN: Montey Hora Primary Care Provider: Harrel Lemon  Other Clinician: Referring Provider: Harrel Lemon Treating Provider/Extender: Melburn Hake, HOYT Weeks in Treatment: 13 Diagnosis Coding ICD-10 Codes Code Description [phone] Non-pressure chronic ulcer of other part of right lower leg with fat layer exposed L97.822 Non-pressure chronic ulcer of other part of left lower leg with fat layer exposed S81.802A Unspecified open wound, left lower leg, initial encounter S81.801A Unspecified open wound, right lower leg, initial encounter I48.0 Paroxysmal atrial fibrillation Z79.01 Long term (current) use of anticoagulants N18.3 Chronic kidney disease, stage 3 (moderate) Facility Procedures CPT4 Code: 70786754 Description: 99213 - WOUND CARE VISIT-LEV 3 EST PT Modifier: Quantity: 1 Physician Procedures CPT4 Code Description: 4920100 71219 - WC PHYS LEVEL 2 - EST PT ICD-10 Diagnosis Description X58.832 Non-pressure chronic ulcer of other part of right lower leg w L97.822 Non-pressure chronic ulcer of other part of left lower leg wi S81.802A Unspecified  open wound, left lower leg, initial encounter S81.801A Unspecified open wound, right lower leg, initial encounter Modifier: ith fat layer expo th fat layer expos Quantity: 1 sed  ed Electronic Signature(s) Signed: 04/18/2018 2:07:25 PM By: Montey Hora Signed: 04/18/2018 2:32:34 PM By: Worthy Keeler PA-C Entered By: Montey Hora on 04/18/2018 14:07:25

## 2018-04-21 NOTE — Progress Notes (Signed)
INGER, WIEST ([id number]) Visit Report for 04/18/2018 Arrival Information Details Patient Name: Elizabeth Fields, [name] Date of Service: 04/18/2018 1:00 PM Medical Record Number: [id number] Patient Account Number: [id number] Date of Birth/Sex: 1931-10-28 (86 y.o. F) Treating RN: Cornell Barman Primary Care Shamica Moree: Harrel Lemon Other Clinician: Referring Khamari Sheehan: Harrel Lemon Treating Sarely Stracener/Extender: Melburn Hake, HOYT Weeks in Treatment: 13 Visit Information History Since Last Visit Added or deleted any medications: No Patient Arrived: Ambulatory Any new allergies or adverse reactions: No Arrival Time: 13:01 Had a fall or experienced change in No Accompanied By: self activities of daily living that may affect Transfer Assistance: Manual risk of falls: Patient Identification Verified: Yes Signs or symptoms of abuse/neglect since last visito No Secondary Verification Process Completed: Yes Hospitalized since last visit: No Implantable device outside of the clinic excluding No cellular tissue based products placed in the center since last visit: Has Dressing in Place as Prescribed: Yes Pain Present Now: No Electronic Signature(s) Signed: 04/21/2018 2:22:43 PM By: Gretta Cool, BSN, RN, CWS, Kim RN, BSN Entered By: Gretta Cool, BSN, RN, CWS, Kim on 04/18/2018 13:01:56 Elizabeth Fields, Elizabeth Fields ([id number]) -------------------------------------------------------------------------------- Clinic Level of Care Assessment Details Patient Name: Elizabeth Fields, [name] Date of Service: 04/18/2018 1:00 PM Medical Record Number: [id number] Patient Account Number: [id number] Date of Birth/Sex: 1931/05/05 (86 y.o. F) Treating RN: Montey Hora Primary Care Jushua Waltman: Harrel Lemon Other Clinician: Referring Dusti Tetro: Harrel Lemon Treating Alyzabeth Pontillo/Extender: Melburn Hake, HOYT Weeks in Treatment: 13 Clinic Level of Care Assessment Items TOOL 4 Quantity Score []  - Use when only an EandM is performed on FOLLOW-UP visit  0 ASSESSMENTS - Nursing Assessment / Reassessment X - Reassessment of Co-morbidities (includes updates in patient status) 1 10 X- 1 5 Reassessment of Adherence to Treatment Plan ASSESSMENTS - Wound and Skin Assessment / Reassessment X - Simple Wound Assessment / Reassessment - one wound 1 5 []  - 0 Complex Wound Assessment / Reassessment - multiple wounds []  - 0 Dermatologic / Skin Assessment (not related to wound area) ASSESSMENTS - Focused Assessment []  - Circumferential Edema Measurements - multi extremities 0 []  - 0 Nutritional Assessment / Counseling / Intervention X- 1 5 Lower Extremity Assessment (monofilament, tuning fork, pulses) []  - 0 Peripheral Arterial Disease Assessment (using hand held doppler) ASSESSMENTS - Ostomy and/or Continence Assessment and Care []  - Incontinence Assessment and Management 0 []  - 0 Ostomy Care Assessment and Management (repouching, etc.) PROCESS - Coordination of Care X - Simple Patient / Family Education for ongoing care 1 15 []  - 0 Complex (extensive) Patient / Family Education for ongoing care X- 1 10 Staff obtains Programmer, systems, Records, Test Results / Process Orders []  - 0 Staff telephones HHA, Nursing Homes / Clarify orders / etc []  - 0 Routine Transfer to another Facility (non-emergent condition) []  - 0 Routine Hospital Admission (non-emergent condition) []  - 0 New Admissions / Biomedical engineer / Ordering NPWT, Apligraf, etc. []  - 0 Emergency Hospital Admission (emergent condition) X- 1 10 Simple Discharge Coordination Elizabeth Fields, [name] ([id number]) []  - 0 Complex (extensive) Discharge Coordination PROCESS - Special Needs []  - Pediatric / Minor Patient Management 0 []  - 0 Isolation Patient Management []  - 0 Hearing / Language / Visual special needs []  - 0 Assessment of Community assistance (transportation, D/C planning, etc.) []  - 0 Additional assistance / Altered mentation []  - 0 Support Surface(s) Assessment (bed,  cushion, seat, etc.) INTERVENTIONS - Wound Cleansing / Measurement X - Simple Wound Cleansing - one wound 1 5 []  - 0 Complex Wound Cleansing -  multiple wounds X- 1 5 Wound Imaging (photographs - any number of wounds) []  - 0 Wound Tracing (instead of photographs) X- 1 5 Simple Wound Measurement - one wound []  - 0 Complex Wound Measurement - multiple wounds INTERVENTIONS - Wound Dressings []  - Small Wound Dressing one or multiple wounds 0 []  - 0 Medium Wound Dressing one or multiple wounds []  - 0 Large Wound Dressing one or multiple wounds []  - 0 Application of Medications - topical []  - 0 Application of Medications - injection INTERVENTIONS - Miscellaneous []  - External ear exam 0 []  - 0 Specimen Collection (cultures, biopsies, blood, body fluids, etc.) []  - 0 Specimen(s) / Culture(s) sent or taken to Lab for analysis []  - 0 Patient Transfer (multiple staff / Civil Service fast streamer / Similar devices) []  - 0 Simple Staple / Suture removal (25 or less) []  - 0 Complex Staple / Suture removal (26 or more) []  - 0 Hypo / Hyperglycemic Management (close monitor of Blood Glucose) []  - 0 Ankle / Brachial Index (ABI) - do not check if billed separately X- 1 5 Vital Signs Elizabeth Fields, [name] ([id number]) Has the patient been seen at the hospital within the last three years: Yes Total Score: 80 Level Of Care: New/Established - Level 3 Electronic Signature(s) Signed: 04/18/2018 2:25:55 PM By: Montey Hora Entered By: Montey Hora on 04/18/2018 14:07:18 Elizabeth Fields, [name] ([id number]) -------------------------------------------------------------------------------- Encounter Discharge Information Details Patient Name: Elizabeth Fields, [name] Date of Service: 04/18/2018 1:00 PM Medical Record Number: [id number] Patient Account Number: [id number] Date of Birth/Sex: May 14, 1931 (86 y.o. F) Treating RN: Montey Hora Primary Care Amaan Meyer: Harrel Lemon Other Clinician: Referring Tiziana Cislo: Harrel Lemon Treating Millenia Waldvogel/Extender: Melburn Hake, HOYT Weeks in Treatment: 35 Encounter Discharge Information Items Discharge Condition: Stable Ambulatory Status: Ambulatory Discharge Destination: Home Transportation: Private Auto Accompanied By: self Schedule Follow-up Appointment: No Clinical Summary of Care: Electronic Signature(s) Signed: 04/18/2018 2:07:59 PM By: Montey Hora Entered By: Montey Hora on 04/18/2018 14:07:59 Cuccaro, Chinenye C. ([id number]) -------------------------------------------------------------------------------- Lower Extremity Assessment Details Patient Name: Elizabeth Fields, [name] Date of Service: 04/18/2018 1:00 PM Medical Record Number: [id number] Patient Account Number: [id number] Date of Birth/Sex: [birth year] (86 y.o. F) Treating RN: Cornell Barman Primary Care Irven Ingalsbe: Harrel Lemon Other Clinician: Referring Archana Eckman: Harrel Lemon Treating Jassiel Flye/Extender: Sharalyn Ink in Treatment: 13 Vascular Assessment Pulses: Dorsalis Pedis Palpable: [Right:Yes] Electronic Signature(s) Signed: 04/21/2018 2:22:43 PM By: Gretta Cool, BSN, RN, CWS, Kim RN, BSN Entered By: Gretta Cool, BSN, RN, CWS, Kim on 04/18/2018 13:05:25 Elizabeth Fields ([id number]) -------------------------------------------------------------------------------- [place] Plan Details Patient Name: Elizabeth Fields, [name] Date of Service: 04/18/2018 1:00 PM Medical Record Number: [id number] Patient Account Number: [id number] Date of Birth/Sex: 04-11-1931 (86 y.o. F) Treating RN: Cornell Barman Primary Care Vonzella Althaus: Harrel Lemon Other Clinician: Referring Dawnetta Copenhaver: Harrel Lemon Treating Gurney Balthazor/Extender: Sharalyn Ink in Treatment: 13 Active Inactive Electronic Signature(s) Signed: 04/18/2018 2:06:56 PM By: Montey Hora Signed: 04/21/2018 2:22:43 PM By: Gretta Cool, BSN, RN, CWS, Kim RN, BSN Entered By: Montey Hora on 04/18/2018 14:06:56 Elizabeth Fields, [name]  ([id number]) -------------------------------------------------------------------------------- Pain Assessment Details Patient Name: Elizabeth Fields, [name] Date of Service: 04/18/2018 1:00 PM Medical Record Number: [id number] Patient Account Number: [id number] Date of Birth/Sex: 08/24/1931 (86 y.o. F) Treating RN: Cornell Barman Primary Care Ellenie Salome: Harrel Lemon Other Clinician: Referring Ionna Avis: Harrel Lemon Treating Darleen Moffitt/Extender: Melburn Hake, HOYT Weeks in Treatment: 13 Active Problems Location of Pain Severity and Description of Pain Patient Has Paino No Site Locations Pain Management and Medication Current Pain Management: Goals for Pain  Management Patient denies pain at this time. Electronic Signature(s) Signed: 04/21/2018 2:22:43 PM By: Gretta Cool, BSN, RN, CWS, Kim RN, BSN Entered By: Gretta Cool, BSN, RN, CWS, Kim on 04/18/2018 13:02:11 Elizabeth Fields ([id number]) -------------------------------------------------------------------------------- Patient/Caregiver Education Details Patient Name: Elizabeth Fields, [name] Date of Service: 04/18/2018 1:00 PM Medical Record Number: [id number] Patient Account Number: [id number] Date of Birth/Gender: [birth year] (85 y.o. F) Treating RN: Montey Hora Primary Care Physician: Harrel Lemon Other Clinician: Referring Physician: Harrel Lemon Treating Physician/Extender: Sharalyn Ink in Treatment: 13 Education Assessment Education Provided To: Patient Education Topics Provided Basic Hygiene: Handouts: Other: care of newly healed ulcer site Methods: Explain/Verbal Responses: State content correctly Electronic Signature(s) Signed: 04/18/2018 2:25:55 PM By: Montey Hora Entered By: Montey Hora on 04/18/2018 14:07:45 Pau, Irem C. ([id number]) -------------------------------------------------------------------------------- Wound Assessment Details Patient Name: Schuld, Anona C. Date of Service: 04/18/2018 1:00 PM Medical Record Number:  [id number] Patient Account Number: [id number] Date of Birth/Sex: January 15, 1931 (86 y.o. F) Treating RN: Cornell Barman Primary Care Raiden Haydu: Harrel Lemon Other Clinician: Referring Shadell Brenn: Harrel Lemon Treating Kimiyah Blick/Extender: Melburn Hake, HOYT Weeks in Treatment: 13 Wound Status Wound Number: 1 Primary Trauma, Other Etiology: Wound Location: Right Lower Leg - Anterior Wound Healed - Epithelialized Wounding Event: Trauma Status: Date Acquired: 12/26/2017 Comorbid Congestive Heart Failure, Coronary Artery Weeks Of Treatment: 13 History: Disease, End Stage Renal Disease Clustered Wound: No Photos Photo Uploaded By: Gretta Cool, BSN, RN, CWS, Kim on 04/18/2018 13:06:09 Wound Measurements Length: (cm) Width: (cm) Depth: (cm) Area: (cm) Volume: (cm) 0 % Reduction in Area: 100% 0 % Reduction in Volume: 100% 0 Epithelialization: Large (67-100%) 0 Tunneling: No 0 Undermining: No Wound Description Full Thickness Without Exposed Support Classification: Structures Periwound Skin Texture Texture Color No Abnormalities Noted: No No Abnormalities Noted: No Moisture No Abnormalities Noted: No Electronic Signature(s) Signed: 04/21/2018 2:22:43 PM By: Gretta Cool, BSN, RN, CWS, Kim RN, BSN Entered By: Gretta Cool, BSN, RN, CWS, Kim on 04/18/2018 13:05:05 Thomason, Bethene C. ([id number]) -------------------------------------------------------------------------------- Vitals Details Patient Name: Flammer, Kenitha C. Date of Service: 04/18/2018 1:00 PM Medical Record Number: [id number] Patient Account Number: [id number] Date of Birth/Sex: 02-01-1931 (86 y.o. F) Treating RN: Cornell Barman Primary Care Gryffin Altice: Harrel Lemon Other Clinician: Referring Corry Ihnen: Harrel Lemon Treating Rondle Lohse/Extender: Melburn Hake, HOYT Weeks in Treatment: 13 Vital Signs Time Taken: 13:02 Temperature (F): 97.8 Height (in): 61 Pulse (bpm): 88 Weight (lbs): 100 Respiratory Rate (breaths/min): 16 Body Mass Index (BMI):  18.9 Blood Pressure (mmHg): 116/58 Reference Range: 80 - 120 mg / dl Electronic Signature(s) Signed: 04/21/2018 2:22:43 PM By: Gretta Cool, BSN, RN, CWS, Kim RN, BSN Entered By: Gretta Cool, BSN, RN, CWS, Kim on 04/18/2018 13:02:43

## 2018-05-05 DIAGNOSIS — I482 Chronic atrial fibrillation, unspecified: Secondary | ICD-10-CM | POA: Diagnosis not present

## 2018-05-12 DIAGNOSIS — E538 Deficiency of other specified B group vitamins: Secondary | ICD-10-CM | POA: Diagnosis not present

## 2018-06-03 DIAGNOSIS — I340 Nonrheumatic mitral (valve) insufficiency: Secondary | ICD-10-CM | POA: Diagnosis not present

## 2018-06-03 DIAGNOSIS — Z9889 Other specified postprocedural states: Secondary | ICD-10-CM | POA: Diagnosis not present

## 2018-06-03 DIAGNOSIS — I361 Nonrheumatic tricuspid (valve) insufficiency: Secondary | ICD-10-CM | POA: Diagnosis not present

## 2018-06-03 DIAGNOSIS — I480 Paroxysmal atrial fibrillation: Secondary | ICD-10-CM | POA: Diagnosis not present

## 2018-06-03 DIAGNOSIS — I341 Nonrheumatic mitral (valve) prolapse: Secondary | ICD-10-CM | POA: Diagnosis not present

## 2018-06-03 DIAGNOSIS — R0602 Shortness of breath: Secondary | ICD-10-CM | POA: Diagnosis not present

## 2018-06-03 DIAGNOSIS — I2510 Atherosclerotic heart disease of native coronary artery without angina pectoris: Secondary | ICD-10-CM | POA: Diagnosis not present

## 2018-06-03 DIAGNOSIS — I493 Ventricular premature depolarization: Secondary | ICD-10-CM | POA: Diagnosis not present

## 2018-06-03 DIAGNOSIS — N183 Chronic kidney disease, stage 3 (moderate): Secondary | ICD-10-CM | POA: Diagnosis not present

## 2018-06-03 DIAGNOSIS — I351 Nonrheumatic aortic (valve) insufficiency: Secondary | ICD-10-CM | POA: Diagnosis not present

## 2018-06-03 DIAGNOSIS — E785 Hyperlipidemia, unspecified: Secondary | ICD-10-CM | POA: Diagnosis not present

## 2018-06-03 DIAGNOSIS — R079 Chest pain, unspecified: Secondary | ICD-10-CM | POA: Diagnosis not present

## 2018-06-12 DIAGNOSIS — E538 Deficiency of other specified B group vitamins: Secondary | ICD-10-CM | POA: Diagnosis not present

## 2018-06-23 DIAGNOSIS — E034 Atrophy of thyroid (acquired): Secondary | ICD-10-CM | POA: Diagnosis not present

## 2018-06-23 DIAGNOSIS — N183 Chronic kidney disease, stage 3 (moderate): Secondary | ICD-10-CM | POA: Diagnosis not present

## 2018-06-30 DIAGNOSIS — E785 Hyperlipidemia, unspecified: Secondary | ICD-10-CM | POA: Diagnosis not present

## 2018-06-30 DIAGNOSIS — N183 Chronic kidney disease, stage 3 (moderate): Secondary | ICD-10-CM | POA: Diagnosis not present

## 2018-06-30 DIAGNOSIS — I480 Paroxysmal atrial fibrillation: Secondary | ICD-10-CM | POA: Diagnosis not present

## 2018-06-30 DIAGNOSIS — Z0000 Encounter for general adult medical examination without abnormal findings: Secondary | ICD-10-CM | POA: Diagnosis not present

## 2018-06-30 DIAGNOSIS — I2510 Atherosclerotic heart disease of native coronary artery without angina pectoris: Secondary | ICD-10-CM | POA: Diagnosis not present

## 2018-06-30 DIAGNOSIS — E034 Atrophy of thyroid (acquired): Secondary | ICD-10-CM | POA: Diagnosis not present

## 2018-07-15 DIAGNOSIS — E538 Deficiency of other specified B group vitamins: Secondary | ICD-10-CM | POA: Diagnosis not present

## 2018-07-28 DIAGNOSIS — I480 Paroxysmal atrial fibrillation: Secondary | ICD-10-CM | POA: Diagnosis not present

## 2018-08-15 DIAGNOSIS — E538 Deficiency of other specified B group vitamins: Secondary | ICD-10-CM | POA: Diagnosis not present

## 2018-08-19 DIAGNOSIS — I480 Paroxysmal atrial fibrillation: Secondary | ICD-10-CM | POA: Diagnosis not present

## 2018-09-17 DIAGNOSIS — E538 Deficiency of other specified B group vitamins: Secondary | ICD-10-CM | POA: Diagnosis not present

## 2018-09-24 DIAGNOSIS — D631 Anemia in chronic kidney disease: Secondary | ICD-10-CM | POA: Diagnosis not present

## 2018-09-24 DIAGNOSIS — N2581 Secondary hyperparathyroidism of renal origin: Secondary | ICD-10-CM | POA: Diagnosis not present

## 2018-09-24 DIAGNOSIS — R609 Edema, unspecified: Secondary | ICD-10-CM | POA: Insufficient documentation

## 2018-09-24 DIAGNOSIS — R319 Hematuria, unspecified: Secondary | ICD-10-CM | POA: Diagnosis not present

## 2018-09-24 DIAGNOSIS — R600 Localized edema: Secondary | ICD-10-CM | POA: Diagnosis not present

## 2018-09-24 DIAGNOSIS — I129 Hypertensive chronic kidney disease with stage 1 through stage 4 chronic kidney disease, or unspecified chronic kidney disease: Secondary | ICD-10-CM | POA: Insufficient documentation

## 2018-09-24 DIAGNOSIS — D649 Anemia, unspecified: Secondary | ICD-10-CM | POA: Insufficient documentation

## 2018-09-24 DIAGNOSIS — N183 Chronic kidney disease, stage 3 (moderate): Secondary | ICD-10-CM | POA: Diagnosis not present

## 2018-09-26 DIAGNOSIS — Z23 Encounter for immunization: Secondary | ICD-10-CM | POA: Diagnosis not present

## 2018-10-06 DIAGNOSIS — I493 Ventricular premature depolarization: Secondary | ICD-10-CM | POA: Diagnosis not present

## 2018-10-06 DIAGNOSIS — Z9889 Other specified postprocedural states: Secondary | ICD-10-CM | POA: Diagnosis not present

## 2018-10-06 DIAGNOSIS — I361 Nonrheumatic tricuspid (valve) insufficiency: Secondary | ICD-10-CM | POA: Diagnosis not present

## 2018-10-06 DIAGNOSIS — I340 Nonrheumatic mitral (valve) insufficiency: Secondary | ICD-10-CM | POA: Diagnosis not present

## 2018-10-06 DIAGNOSIS — R079 Chest pain, unspecified: Secondary | ICD-10-CM | POA: Diagnosis not present

## 2018-10-06 DIAGNOSIS — R0602 Shortness of breath: Secondary | ICD-10-CM | POA: Diagnosis not present

## 2018-10-06 DIAGNOSIS — N183 Chronic kidney disease, stage 3 (moderate): Secondary | ICD-10-CM | POA: Diagnosis not present

## 2018-10-06 DIAGNOSIS — E785 Hyperlipidemia, unspecified: Secondary | ICD-10-CM | POA: Diagnosis not present

## 2018-10-06 DIAGNOSIS — I351 Nonrheumatic aortic (valve) insufficiency: Secondary | ICD-10-CM | POA: Diagnosis not present

## 2018-10-06 DIAGNOSIS — I2510 Atherosclerotic heart disease of native coronary artery without angina pectoris: Secondary | ICD-10-CM | POA: Diagnosis not present

## 2018-10-06 DIAGNOSIS — I341 Nonrheumatic mitral (valve) prolapse: Secondary | ICD-10-CM | POA: Diagnosis not present

## 2018-10-06 DIAGNOSIS — I480 Paroxysmal atrial fibrillation: Secondary | ICD-10-CM | POA: Diagnosis not present

## 2018-10-20 DIAGNOSIS — E538 Deficiency of other specified B group vitamins: Secondary | ICD-10-CM | POA: Diagnosis not present

## 2018-10-27 DIAGNOSIS — Z1283 Encounter for screening for malignant neoplasm of skin: Secondary | ICD-10-CM | POA: Diagnosis not present

## 2018-10-27 DIAGNOSIS — I8393 Asymptomatic varicose veins of bilateral lower extremities: Secondary | ICD-10-CM | POA: Diagnosis not present

## 2018-10-27 DIAGNOSIS — L821 Other seborrheic keratosis: Secondary | ICD-10-CM | POA: Diagnosis not present

## 2018-10-27 DIAGNOSIS — D692 Other nonthrombocytopenic purpura: Secondary | ICD-10-CM | POA: Diagnosis not present

## 2018-10-27 DIAGNOSIS — L820 Inflamed seborrheic keratosis: Secondary | ICD-10-CM | POA: Diagnosis not present

## 2018-10-27 DIAGNOSIS — D1800 Hemangioma unspecified site: Secondary | ICD-10-CM | POA: Diagnosis not present

## 2018-10-27 DIAGNOSIS — L570 Actinic keratosis: Secondary | ICD-10-CM | POA: Diagnosis not present

## 2018-10-27 DIAGNOSIS — L814 Other melanin hyperpigmentation: Secondary | ICD-10-CM | POA: Diagnosis not present

## 2018-10-27 DIAGNOSIS — L720 Epidermal cyst: Secondary | ICD-10-CM | POA: Diagnosis not present

## 2018-10-27 DIAGNOSIS — L578 Other skin changes due to chronic exposure to nonionizing radiation: Secondary | ICD-10-CM | POA: Diagnosis not present

## 2018-11-03 DIAGNOSIS — N184 Chronic kidney disease, stage 4 (severe): Secondary | ICD-10-CM | POA: Diagnosis not present

## 2018-11-03 DIAGNOSIS — M810 Age-related osteoporosis without current pathological fracture: Secondary | ICD-10-CM | POA: Diagnosis not present

## 2018-11-03 DIAGNOSIS — I480 Paroxysmal atrial fibrillation: Secondary | ICD-10-CM | POA: Diagnosis not present

## 2018-11-03 DIAGNOSIS — K219 Gastro-esophageal reflux disease without esophagitis: Secondary | ICD-10-CM | POA: Diagnosis not present

## 2018-11-24 DIAGNOSIS — M810 Age-related osteoporosis without current pathological fracture: Secondary | ICD-10-CM | POA: Diagnosis not present

## 2018-11-24 DIAGNOSIS — E538 Deficiency of other specified B group vitamins: Secondary | ICD-10-CM | POA: Diagnosis not present

## 2018-11-24 DIAGNOSIS — M5442 Lumbago with sciatica, left side: Secondary | ICD-10-CM | POA: Diagnosis not present

## 2018-11-24 DIAGNOSIS — M545 Low back pain: Secondary | ICD-10-CM | POA: Diagnosis not present

## 2018-11-25 ENCOUNTER — Other Ambulatory Visit: Payer: Self-pay | Admitting: Orthopedic Surgery

## 2018-11-25 DIAGNOSIS — S32010A Wedge compression fracture of first lumbar vertebra, initial encounter for closed fracture: Secondary | ICD-10-CM

## 2018-11-26 ENCOUNTER — Other Ambulatory Visit: Payer: Self-pay

## 2018-11-26 ENCOUNTER — Telehealth: Payer: Self-pay | Admitting: *Deleted

## 2018-11-26 ENCOUNTER — Ambulatory Visit
Admission: RE | Admit: 2018-11-26 | Discharge: 2018-11-26 | Disposition: A | Discharge status: Home / Self Care | Payer: PPO | Source: Ambulatory Visit | Attending: Orthopedic Surgery | Admitting: Orthopedic Surgery

## 2018-11-26 DIAGNOSIS — S32010A Wedge compression fracture of first lumbar vertebra, initial encounter for closed fracture: Secondary | ICD-10-CM | POA: Diagnosis not present

## 2018-11-26 DIAGNOSIS — M545 Low back pain: Secondary | ICD-10-CM | POA: Diagnosis not present

## 2018-11-27 ENCOUNTER — Ambulatory Visit
Admission: RE | Admit: 2018-11-27 | Discharge: 2018-11-27 | Disposition: A | Discharge status: Home / Self Care | Payer: PPO | Source: Ambulatory Visit | Attending: Family Medicine | Admitting: Family Medicine

## 2018-11-27 ENCOUNTER — Other Ambulatory Visit (HOSPITAL_COMMUNITY): Payer: Self-pay | Admitting: Family Medicine

## 2018-11-27 ENCOUNTER — Other Ambulatory Visit: Payer: Self-pay | Admitting: Family Medicine

## 2018-11-27 DIAGNOSIS — R1032 Left lower quadrant pain: Secondary | ICD-10-CM | POA: Insufficient documentation

## 2018-11-27 DIAGNOSIS — R112 Nausea with vomiting, unspecified: Secondary | ICD-10-CM | POA: Diagnosis not present

## 2018-11-27 HISTORY — DX: Disorder of kidney and ureter, unspecified: N28.9

## 2018-11-27 MED ORDER — IOHEXOL 300 MG/ML  SOLN
65.0000 mL | Freq: Once | INTRAMUSCULAR | Status: AC | PRN
  Administered 2018-11-27: 65 mL via INTRAVENOUS

## 2018-11-28 ENCOUNTER — Other Ambulatory Visit: Payer: Self-pay | Admitting: Family Medicine

## 2018-11-28 ENCOUNTER — Ambulatory Visit: Payer: PPO

## 2018-11-28 DIAGNOSIS — R1032 Left lower quadrant pain: Secondary | ICD-10-CM

## 2018-12-17 DIAGNOSIS — M47816 Spondylosis without myelopathy or radiculopathy, lumbar region: Secondary | ICD-10-CM | POA: Diagnosis not present

## 2018-12-17 DIAGNOSIS — M461 Sacroiliitis, not elsewhere classified: Secondary | ICD-10-CM | POA: Diagnosis not present

## 2018-12-17 DIAGNOSIS — M7062 Trochanteric bursitis, left hip: Secondary | ICD-10-CM | POA: Diagnosis not present

## 2018-12-17 DIAGNOSIS — S32010A Wedge compression fracture of first lumbar vertebra, initial encounter for closed fracture: Secondary | ICD-10-CM | POA: Diagnosis not present

## 2018-12-22 DIAGNOSIS — E034 Atrophy of thyroid (acquired): Secondary | ICD-10-CM | POA: Diagnosis not present

## 2018-12-22 DIAGNOSIS — I480 Paroxysmal atrial fibrillation: Secondary | ICD-10-CM | POA: Diagnosis not present

## 2018-12-22 DIAGNOSIS — M533 Sacrococcygeal disorders, not elsewhere classified: Secondary | ICD-10-CM | POA: Diagnosis not present

## 2018-12-22 DIAGNOSIS — N183 Chronic kidney disease, stage 3 unspecified: Secondary | ICD-10-CM | POA: Diagnosis not present

## 2018-12-22 DIAGNOSIS — M545 Low back pain: Secondary | ICD-10-CM | POA: Diagnosis not present

## 2018-12-29 DIAGNOSIS — E785 Hyperlipidemia, unspecified: Secondary | ICD-10-CM | POA: Diagnosis not present

## 2018-12-29 DIAGNOSIS — I351 Nonrheumatic aortic (valve) insufficiency: Secondary | ICD-10-CM | POA: Diagnosis not present

## 2018-12-29 DIAGNOSIS — E538 Deficiency of other specified B group vitamins: Secondary | ICD-10-CM | POA: Diagnosis not present

## 2018-12-29 DIAGNOSIS — I480 Paroxysmal atrial fibrillation: Secondary | ICD-10-CM | POA: Diagnosis not present

## 2018-12-29 DIAGNOSIS — E034 Atrophy of thyroid (acquired): Secondary | ICD-10-CM | POA: Diagnosis not present

## 2018-12-29 DIAGNOSIS — I2510 Atherosclerotic heart disease of native coronary artery without angina pectoris: Secondary | ICD-10-CM | POA: Diagnosis not present

## 2018-12-29 DIAGNOSIS — N1832 Chronic kidney disease, stage 3b: Secondary | ICD-10-CM | POA: Diagnosis not present

## 2019-01-07 DIAGNOSIS — M810 Age-related osteoporosis without current pathological fracture: Secondary | ICD-10-CM | POA: Diagnosis not present

## 2019-01-26 DIAGNOSIS — I480 Paroxysmal atrial fibrillation: Secondary | ICD-10-CM | POA: Diagnosis not present

## 2019-02-02 DIAGNOSIS — E538 Deficiency of other specified B group vitamins: Secondary | ICD-10-CM | POA: Diagnosis not present

## 2019-02-06 ENCOUNTER — Other Ambulatory Visit: Payer: Self-pay | Admitting: Internal Medicine

## 2019-02-06 DIAGNOSIS — Z1231 Encounter for screening mammogram for malignant neoplasm of breast: Secondary | ICD-10-CM

## 2019-02-09 ENCOUNTER — Inpatient Hospital Stay
Admission: EM | Admit: 2019-02-09 | Discharge: 2019-02-18 | DRG: 521 | Disposition: A | Discharge status: Home Health | Payer: PPO | Attending: Internal Medicine | Admitting: Internal Medicine

## 2019-02-09 ENCOUNTER — Emergency Department: Payer: PPO

## 2019-02-09 ENCOUNTER — Encounter: Payer: Self-pay | Admitting: Emergency Medicine

## 2019-02-09 ENCOUNTER — Other Ambulatory Visit: Payer: Self-pay

## 2019-02-09 DIAGNOSIS — J90 Pleural effusion, not elsewhere classified: Secondary | ICD-10-CM | POA: Diagnosis not present

## 2019-02-09 DIAGNOSIS — E785 Hyperlipidemia, unspecified: Secondary | ICD-10-CM | POA: Diagnosis not present

## 2019-02-09 DIAGNOSIS — R5082 Postprocedural fever: Secondary | ICD-10-CM | POA: Diagnosis not present

## 2019-02-09 DIAGNOSIS — H9190 Unspecified hearing loss, unspecified ear: Secondary | ICD-10-CM | POA: Diagnosis present

## 2019-02-09 DIAGNOSIS — I340 Nonrheumatic mitral (valve) insufficiency: Secondary | ICD-10-CM | POA: Diagnosis not present

## 2019-02-09 DIAGNOSIS — M810 Age-related osteoporosis without current pathological fracture: Secondary | ICD-10-CM | POA: Diagnosis present

## 2019-02-09 DIAGNOSIS — D62 Acute posthemorrhagic anemia: Secondary | ICD-10-CM | POA: Diagnosis not present

## 2019-02-09 DIAGNOSIS — Z7901 Long term (current) use of anticoagulants: Secondary | ICD-10-CM

## 2019-02-09 DIAGNOSIS — I2510 Atherosclerotic heart disease of native coronary artery without angina pectoris: Secondary | ICD-10-CM | POA: Diagnosis not present

## 2019-02-09 DIAGNOSIS — Z20822 Contact with and (suspected) exposure to covid-19: Secondary | ICD-10-CM | POA: Diagnosis present

## 2019-02-09 DIAGNOSIS — Z79890 Hormone replacement therapy (postmenopausal): Secondary | ICD-10-CM

## 2019-02-09 DIAGNOSIS — I9581 Postprocedural hypotension: Secondary | ICD-10-CM | POA: Diagnosis not present

## 2019-02-09 DIAGNOSIS — N1832 Chronic kidney disease, stage 3b: Secondary | ICD-10-CM | POA: Diagnosis not present

## 2019-02-09 DIAGNOSIS — Z85828 Personal history of other malignant neoplasm of skin: Secondary | ICD-10-CM

## 2019-02-09 DIAGNOSIS — Z9049 Acquired absence of other specified parts of digestive tract: Secondary | ICD-10-CM

## 2019-02-09 DIAGNOSIS — K219 Gastro-esophageal reflux disease without esophagitis: Secondary | ICD-10-CM | POA: Diagnosis present

## 2019-02-09 DIAGNOSIS — Z885 Allergy status to narcotic agent status: Secondary | ICD-10-CM

## 2019-02-09 DIAGNOSIS — M80852A Other osteoporosis with current pathological fracture, left femur, initial encounter for fracture: Secondary | ICD-10-CM | POA: Diagnosis present

## 2019-02-09 DIAGNOSIS — I5030 Unspecified diastolic (congestive) heart failure: Secondary | ICD-10-CM | POA: Diagnosis present

## 2019-02-09 DIAGNOSIS — S299XXA Unspecified injury of thorax, initial encounter: Secondary | ICD-10-CM | POA: Diagnosis not present

## 2019-02-09 DIAGNOSIS — W010XXA Fall on same level from slipping, tripping and stumbling without subsequent striking against object, initial encounter: Secondary | ICD-10-CM | POA: Diagnosis present

## 2019-02-09 DIAGNOSIS — I509 Heart failure, unspecified: Secondary | ICD-10-CM | POA: Diagnosis not present

## 2019-02-09 DIAGNOSIS — N189 Chronic kidney disease, unspecified: Secondary | ICD-10-CM | POA: Diagnosis not present

## 2019-02-09 DIAGNOSIS — N179 Acute kidney failure, unspecified: Secondary | ICD-10-CM | POA: Diagnosis not present

## 2019-02-09 DIAGNOSIS — Z419 Encounter for procedure for purposes other than remedying health state, unspecified: Secondary | ICD-10-CM

## 2019-02-09 DIAGNOSIS — Z880 Allergy status to penicillin: Secondary | ICD-10-CM

## 2019-02-09 DIAGNOSIS — E8770 Fluid overload, unspecified: Secondary | ICD-10-CM | POA: Diagnosis present

## 2019-02-09 DIAGNOSIS — I4891 Unspecified atrial fibrillation: Secondary | ICD-10-CM | POA: Diagnosis not present

## 2019-02-09 DIAGNOSIS — N183 Chronic kidney disease, stage 3 unspecified: Secondary | ICD-10-CM | POA: Diagnosis not present

## 2019-02-09 DIAGNOSIS — Z471 Aftercare following joint replacement surgery: Secondary | ICD-10-CM | POA: Diagnosis not present

## 2019-02-09 DIAGNOSIS — Z803 Family history of malignant neoplasm of breast: Secondary | ICD-10-CM

## 2019-02-09 DIAGNOSIS — Z7982 Long term (current) use of aspirin: Secondary | ICD-10-CM | POA: Diagnosis not present

## 2019-02-09 DIAGNOSIS — Z6823 Body mass index (BMI) 23.0-23.9, adult: Secondary | ICD-10-CM

## 2019-02-09 DIAGNOSIS — I351 Nonrheumatic aortic (valve) insufficiency: Secondary | ICD-10-CM | POA: Diagnosis present

## 2019-02-09 DIAGNOSIS — S72042A Displaced fracture of base of neck of left femur, initial encounter for closed fracture: Secondary | ICD-10-CM | POA: Diagnosis not present

## 2019-02-09 DIAGNOSIS — Z8249 Family history of ischemic heart disease and other diseases of the circulatory system: Secondary | ICD-10-CM | POA: Diagnosis not present

## 2019-02-09 DIAGNOSIS — I482 Chronic atrial fibrillation, unspecified: Secondary | ICD-10-CM | POA: Diagnosis not present

## 2019-02-09 DIAGNOSIS — Z888 Allergy status to other drugs, medicaments and biological substances status: Secondary | ICD-10-CM | POA: Diagnosis not present

## 2019-02-09 DIAGNOSIS — Z9889 Other specified postprocedural states: Secondary | ICD-10-CM | POA: Diagnosis not present

## 2019-02-09 DIAGNOSIS — R0602 Shortness of breath: Secondary | ICD-10-CM

## 2019-02-09 DIAGNOSIS — I130 Hypertensive heart and chronic kidney disease with heart failure and stage 1 through stage 4 chronic kidney disease, or unspecified chronic kidney disease: Secondary | ICD-10-CM | POA: Diagnosis not present

## 2019-02-09 DIAGNOSIS — S72002D Fracture of unspecified part of neck of left femur, subsequent encounter for closed fracture with routine healing: Secondary | ICD-10-CM | POA: Diagnosis not present

## 2019-02-09 DIAGNOSIS — E039 Hypothyroidism, unspecified: Secondary | ICD-10-CM | POA: Diagnosis present

## 2019-02-09 DIAGNOSIS — I361 Nonrheumatic tricuspid (valve) insufficiency: Secondary | ICD-10-CM | POA: Diagnosis not present

## 2019-02-09 DIAGNOSIS — Z8781 Personal history of (healed) traumatic fracture: Secondary | ICD-10-CM

## 2019-02-09 DIAGNOSIS — D519 Vitamin B12 deficiency anemia, unspecified: Secondary | ICD-10-CM | POA: Diagnosis present

## 2019-02-09 DIAGNOSIS — E43 Unspecified severe protein-calorie malnutrition: Secondary | ICD-10-CM | POA: Diagnosis not present

## 2019-02-09 DIAGNOSIS — G8918 Other acute postprocedural pain: Secondary | ICD-10-CM

## 2019-02-09 DIAGNOSIS — S72002A Fracture of unspecified part of neck of left femur, initial encounter for closed fracture: Secondary | ICD-10-CM | POA: Diagnosis not present

## 2019-02-09 DIAGNOSIS — Z96642 Presence of left artificial hip joint: Secondary | ICD-10-CM | POA: Diagnosis not present

## 2019-02-09 DIAGNOSIS — Z79899 Other long term (current) drug therapy: Secondary | ICD-10-CM

## 2019-02-09 DIAGNOSIS — I480 Paroxysmal atrial fibrillation: Secondary | ICD-10-CM | POA: Diagnosis not present

## 2019-02-09 DIAGNOSIS — S72002S Fracture of unspecified part of neck of left femur, sequela: Secondary | ICD-10-CM | POA: Diagnosis present

## 2019-02-09 DIAGNOSIS — W19XXXA Unspecified fall, initial encounter: Secondary | ICD-10-CM

## 2019-02-09 DIAGNOSIS — W19XXXS Unspecified fall, sequela: Secondary | ICD-10-CM | POA: Diagnosis not present

## 2019-02-09 DIAGNOSIS — Z03818 Encounter for observation for suspected exposure to other biological agents ruled out: Secondary | ICD-10-CM | POA: Diagnosis not present

## 2019-02-09 DIAGNOSIS — Z79891 Long term (current) use of opiate analgesic: Secondary | ICD-10-CM

## 2019-02-09 LAB — CBC WITH DIFFERENTIAL/PLATELET
Abs Immature Granulocytes: 0.09 10*3/uL — ABNORMAL HIGH (ref 0.00–0.07)
Basophils Absolute: 0 10*3/uL (ref 0.0–0.1)
Basophils Relative: 0 %
Eosinophils Absolute: 0 10*3/uL (ref 0.0–0.5)
Eosinophils Relative: 0 %
HCT: 42.3 % (ref 36.0–46.0)
Hemoglobin: 14 g/dL (ref 12.0–15.0)
Immature Granulocytes: 1 %
Lymphocytes Relative: 9 %
Lymphs Abs: 0.9 10*3/uL (ref 0.7–4.0)
MCH: 31.4 pg (ref 26.0–34.0)
MCHC: 33.1 g/dL (ref 30.0–36.0)
MCV: 94.8 fL (ref 80.0–100.0)
Monocytes Absolute: 0.6 10*3/uL (ref 0.1–1.0)
Monocytes Relative: 6 %
Neutro Abs: 9.1 10*3/uL — ABNORMAL HIGH (ref 1.7–7.7)
Neutrophils Relative %: 84 %
Platelets: 267 10*3/uL (ref 150–400)
RBC: 4.46 MIL/uL (ref 3.87–5.11)
RDW: 14.6 % (ref 11.5–15.5)
WBC: 10.8 10*3/uL — ABNORMAL HIGH (ref 4.0–10.5)
nRBC: 0 % (ref 0.0–0.2)

## 2019-02-09 LAB — BASIC METABOLIC PANEL
Anion gap: 15 (ref 5–15)
BUN: 26 mg/dL — ABNORMAL HIGH (ref 8–23)
CO2: 27 mmol/L (ref 22–32)
Calcium: 9.6 mg/dL (ref 8.9–10.3)
Chloride: 96 mmol/L — ABNORMAL LOW (ref 98–111)
Creatinine, Ser: 1.28 mg/dL — ABNORMAL HIGH (ref 0.44–1.00)
GFR calc Af Amer: 44 mL/min — ABNORMAL LOW (ref >60)
GFR calc non Af Amer: 38 mL/min — ABNORMAL LOW (ref >60)
Glucose, Bld: 132 mg/dL — ABNORMAL HIGH (ref 70–99)
Potassium: 4.3 mmol/L (ref 3.5–5.1)
Sodium: 138 mmol/L (ref 135–145)

## 2019-02-09 LAB — PROTIME-INR
INR: 2.5 — ABNORMAL HIGH (ref 0.8–1.2)
Prothrombin Time: 27.2 seconds — ABNORMAL HIGH (ref 11.4–15.2)

## 2019-02-09 LAB — RESPIRATORY PANEL BY RT PCR (FLU A&B, COVID)
Influenza A by PCR: NEGATIVE
Influenza B by PCR: NEGATIVE
SARS Coronavirus 2 by RT PCR: NEGATIVE

## 2019-02-09 LAB — APTT: aPTT: 35 seconds (ref 24–36)

## 2019-02-09 MED ORDER — FUROSEMIDE 40 MG PO TABS
40.0000 mg | ORAL_TABLET | Freq: Two times a day (BID) | ORAL | Status: DC
  Administered 2019-02-09: 40 mg via ORAL
  Filled 2019-02-09: qty 1

## 2019-02-09 MED ORDER — DOXEPIN HCL 10 MG PO CAPS
10.0000 mg | ORAL_CAPSULE | Freq: Every day | ORAL | Status: DC
  Administered 2019-02-10 – 2019-02-18 (×8): 10 mg via ORAL
  Filled 2019-02-09 (×10): qty 1

## 2019-02-09 MED ORDER — METOPROLOL SUCCINATE ER 50 MG PO TB24
100.0000 mg | ORAL_TABLET | Freq: Every day | ORAL | Status: DC
  Administered 2019-02-10 – 2019-02-12 (×3): 100 mg via ORAL
  Filled 2019-02-09 (×3): qty 2

## 2019-02-09 MED ORDER — SIMVASTATIN 20 MG PO TABS
20.0000 mg | ORAL_TABLET | Freq: Every day | ORAL | Status: DC
  Administered 2019-02-09 – 2019-02-18 (×9): 20 mg via ORAL
  Filled 2019-02-09 (×9): qty 1

## 2019-02-09 MED ORDER — SODIUM CHLORIDE 0.45 % IV SOLN: INTRAVENOUS | Status: DC

## 2019-02-09 MED ORDER — TRAMADOL HCL 50 MG PO TABS
50.0000 mg | ORAL_TABLET | Freq: Three times a day (TID) | ORAL | Status: DC | PRN
  Filled 2019-02-09: qty 1

## 2019-02-09 MED ORDER — LEVOTHYROXINE SODIUM 50 MCG PO TABS
50.0000 ug | ORAL_TABLET | Freq: Every day | ORAL | Status: DC
  Administered 2019-02-10 – 2019-02-18 (×8): 50 ug via ORAL
  Filled 2019-02-09 (×9): qty 1

## 2019-02-09 MED ORDER — LEVOTHYROXINE SODIUM 50 MCG PO TABS: 100.0000 ug | ORAL_TABLET | Freq: Every day | ORAL | Status: DC

## 2019-02-09 MED ORDER — MORPHINE SULFATE (PF) 2 MG/ML IV SOLN: 0.5000 mg | INTRAVENOUS | Status: DC | PRN

## 2019-02-09 MED ORDER — MAGNESIUM OXIDE 400 (241.3 MG) MG PO TABS
400.0000 mg | ORAL_TABLET | Freq: Every day | ORAL | Status: DC
  Administered 2019-02-10 – 2019-02-18 (×8): 400 mg via ORAL
  Filled 2019-02-09 (×8): qty 1

## 2019-02-09 MED ORDER — PANTOPRAZOLE SODIUM 40 MG PO TBEC: 40.0000 mg | DELAYED_RELEASE_TABLET | Freq: Every day | ORAL | Status: DC

## 2019-02-09 MED ORDER — OXYCODONE HCL 5 MG PO TABS
5.0000 mg | ORAL_TABLET | ORAL | Status: DC | PRN
  Filled 2019-02-09: qty 1

## 2019-02-09 MED ORDER — ENOXAPARIN SODIUM 40 MG/0.4ML ~~LOC~~ SOLN: 40.0000 mg | SUBCUTANEOUS | Status: DC

## 2019-02-09 MED ORDER — SENNA 8.6 MG PO TABS
1.0000 | ORAL_TABLET | Freq: Two times a day (BID) | ORAL | Status: DC
  Administered 2019-02-09 – 2019-02-18 (×12): 8.6 mg via ORAL
  Filled 2019-02-09 (×13): qty 1

## 2019-02-09 MED ORDER — SPIRONOLACTONE 25 MG PO TABS
25.0000 mg | ORAL_TABLET | Freq: Every day | ORAL | Status: DC
  Administered 2019-02-10 – 2019-02-11 (×2): 25 mg via ORAL
  Filled 2019-02-09 (×2): qty 1

## 2019-02-09 MED ORDER — PANTOPRAZOLE SODIUM 40 MG PO TBEC
40.0000 mg | DELAYED_RELEASE_TABLET | Freq: Two times a day (BID) | ORAL | Status: DC
  Administered 2019-02-09 – 2019-02-11 (×5): 40 mg via ORAL
  Filled 2019-02-09 (×5): qty 1

## 2019-02-09 MED ORDER — HYDROMORPHONE HCL 1 MG/ML IJ SOLN: 0.5000 mg | INTRAMUSCULAR | Status: DC | PRN

## 2019-02-09 MED ORDER — ACETAMINOPHEN 500 MG PO TABS: 500.0000 mg | ORAL_TABLET | ORAL | Status: DC | PRN

## 2019-02-09 MED ORDER — KETOROLAC TROMETHAMINE 15 MG/ML IJ SOLN
15.0000 mg | Freq: Four times a day (QID) | INTRAMUSCULAR | Status: DC | PRN
  Administered 2019-02-09: 15 mg via INTRAVENOUS
  Filled 2019-02-09 (×4): qty 1

## 2019-02-09 NOTE — ED Notes (Signed)
Attempted to call report to floor, they will call back

## 2019-02-09 NOTE — Consult Note (Addendum)
ORTHOPAEDIC CONSULTATION  PATIENT NAME: Elizabeth Fields DOB: 1931/10/10  MRN: [id number]  REQUESTING PHYSICIAN: Nance Pear, MD  Chief Complaint: Left hip pain  HPI: Elizabeth Fields is a 86 y.o. female who complains of left hip pain.  She tripped and fell earlier today, landing on a concrete surface.  She had the immediate onset of severe left hip pain and was unable to stand or bear weight due to the left hip pain.  She denied any loss of consciousness.  She denied any other injuries.  Past Medical History:  Diagnosis Date  . Allergy   . Atrial fibrillation ([employer])   . CHF (congestive heart failure) ([place])   . History of squamous cell carcinoma   . Hyperlipidemia   . Hypertension   . Renal insufficiency   . Thyroid disease    Past Surgical History:  Procedure Laterality Date  . CHOLECYSTECTOMY    . HIP SURGERY     Social History   Socioeconomic History  . Marital status: Married    Spouse name: Not on file  . Number of children: Not on file  . Years of education: Not on file  . Highest education level: Not on file  Occupational History  . Not on file  Tobacco Use  . Smoking status: Never Smoker  . Smokeless tobacco: Never Used  Substance and Sexual Activity  . Alcohol use: No  . Drug use: No  . Sexual activity: Not Currently  Other Topics Concern  . Not on file  Social History Narrative  . Not on file   Social Determinants of Health   Financial Resource Strain:   . Difficulty of Paying Living Expenses: Not on file  Food Insecurity:   . Worried About Charity fundraiser in the Last Year: Not on file  . Ran Out of Food in the Last Year: Not on file  Transportation Needs:   . Lack of Transportation (Medical): Not on file  . Lack of Transportation (Non-Medical): Not on file  Physical Activity:   . Days of Exercise per Week: Not on file  . Minutes of Exercise per Session: Not on file  Stress:   . Feeling of Stress : Not on file  Social Connections:   .  Frequency of Communication with Friends and Family: Not on file  . Frequency of Social Gatherings with Friends and Family: Not on file  . Attends Religious Services: Not on file  . Active Member of Clubs or Organizations: Not on file  . Attends Archivist Meetings: Not on file  . Marital Status: Not on file   Family History  Problem Relation Age of Onset  . Breast cancer Mother 52  . Heart disease Father    Allergies  Allergen Reactions  . Fosamax [Alendronate Sodium] Nausea And Vomiting  . Penicillins Nausea And Vomiting and Palpitations  . Tramadol Nausea And Vomiting and Palpitations   Prior to Admission medications   Medication Sig Start Date End Date Taking? Authorizing Provider  aspirin 81 MG tablet Take 81 mg by mouth daily.    [provider]  doxepin (SINEQUAN) 10 MG capsule Take 10 mg by mouth 3 (three) times daily.    [provider]  esomeprazole (NEXIUM) 40 MG capsule Take 40 mg by mouth daily at 12 noon.    [provider]  furosemide (LASIX) 20 MG tablet Take 40-60 mg by mouth 2 (two) times daily. 01/07/19   [provider]  levothyroxine (SYNTHROID, [place])  100 MCG tablet Take 100 mcg by mouth daily before breakfast.    [provider]  magnesium oxide (MAG-OX) 400 MG tablet Take 400 mg by mouth daily.    [provider]  metoprolol succinate (TOPROL-XL) 50 MG 24 hr tablet Take 100 mg by mouth daily. Take with or immediately following a meal.    [provider]  simvastatin (ZOCOR) 20 MG tablet Take 20 mg by mouth daily.    [provider]  spironolactone (ALDACTONE) 25 MG tablet Take 25 mg by mouth daily.    [provider]  traMADol (ULTRAM) 50 MG tablet Take 50 mg by mouth every 8 (eight) hours as needed. 01/27/19   [provider]  warfarin (COUMADIN) 4 MG tablet Take 4 mg by mouth daily.    [provider]   DG Chest 1 View  Result Date:  02/09/2019 CLINICAL DATA:  Arrives with spouse who reports that patient fell at home and injured left hip. Patient C/O left hip pain. Unable to bear weight. Pain with any movement to left leg. EXAM: CHEST  1 VIEW COMPARISON:  08/25/2014 FINDINGS: There is mild bilateral interstitial thickening likely chronic. There is no focal consolidation. There is no pleural effusion or pneumothorax. The heart and mediastinal contours are unremarkable. There is no acute osseous abnormality. There is an S-shaped curvature of the thoracolumbar spine. IMPRESSION: No active disease. Electronically Signed   By: Kathreen Devoid   On: 02/09/2019 16:47   DG Hip Unilat W or Wo Pelvis 2-3 Views Left  Result Date: 02/09/2019 CLINICAL DATA:  Left hip pain EXAM: DG HIP (WITH OR WITHOUT PELVIS) 2-3V LEFT COMPARISON:  CT abdomen and pelvis from November of 2020 FINDINGS: Osteopenia with signs of previous right femoral neck ORIF with 3 lack type screws, unchanged. Old fractures about the symphysis pubis involving right pubic bone. Displaced femoral neck fracture on the left with varus deformity and superior translation of the femoral shaft relative to femoral head. Also with some potential posterior translation of the femoral shaft relative to femoral head and proximal femoral neck. IMPRESSION: Displaced left femoral neck fracture with resultant varus deformity as described. Electronically Signed   By: Zetta Bills M.D.   On: 02/09/2019 16:47    Positive ROS: All other systems have been reviewed and were otherwise negative with the exception of those mentioned in the HPI and as above.  Physical Exam: General: Well developed, well nourished female seen in no acute distress. HEENT: Atraumatic and normocephalic. Sclera are clear. Extraocular motion is intact. Oropharynx is clear with dry mucosa. Neck: Supple, nontender, good range of motion. No JVD. Cardiovascular: Pedal pulses are palpable bilaterally. Homans test is negative  bilaterally. No significant pretibial or ankle edema. Skin: Multiple areas of ecchymosis which the patient states is status quo given her anticoagulation with Coumadin. Neurologic: Awake, alert, and oriented. Sensory function is grossly intact. Motor strength is felt to be 5 over 5 bilaterally with the exception of the left lower extremity that was not assessed due to the injury. No clonus or tremor. Good motor coordination. Lymphatic: No gross lymphadenopathy  MUSCULOSKELETAL: Evaluation of the left lower extremity demonstrates leg to be shortened and rotated.  Hip pain is elicited with any attempted range of motion.  There is no tenderness to palpation about the left knee or ankle.  No knee effusion.  No soft tissue swelling about the knee or ankle.  Assessment: Displaced left femoral neck fracture  Plan: The findings were discussed  in detail with the patient and her husband.  They are patients of Dr. Gordy Levan and have requested his services.  I spoke to Dr. Rudene Christians who will see the patient in the morning.  I did discuss the anticipated plans for left hip hemiarthroplasty versus total hip arthroplasty.  Further details as per Dr. Rudene Christians.  The patient is awaiting evaluation by the hospitalist.  She is currently anticoagulated with Coumadin.  Anesthesiology requires an INR of less than 1.4 in order to perform regional anesthesia.  I will leave it to the discretion of the hospitalist about possible reversal with vitamin K.  Shyanne Mcclary P. Holley Bouche M.D.

## 2019-02-09 NOTE — ED Triage Notes (Signed)
Arrives with spouse who reports that patient fell at home and injured left hip.  Patient C/O left hip pain.  Unable to bear weight.  Pain with any movement. To left leg.

## 2019-02-09 NOTE — H&P (Signed)
History and Physical    Elizabeth Fields:[id number] DOB: [birth year] DOA: 02/09/2019  PCP: Baxter Hire, MD (Confirm with patient/family/NH records and if not entered, this has to be entered at [facility] point of entry) Patient coming from: home   I have personally briefly reviewed patient's old medical records in [place]  Chief Complaint: hip pain after fall  HPI: Elizabeth Fields is a 86 y.o. female with medical history significant of a. Fib, HTN, HLD, renal insufficiency, hypothyroidism, aortic and mitral valve disease. She reports a recent illness described as colitis with N/V, LLQ pain and fever. CT scan made the diagnosis (?diverticulitis) and she was treated with abx x 2 weeks as outpatient. She presents to the emergency department today because of concern for left hip pain after a fall. The patient was trying to move some drinks on the floor with her foot when she fell over. Landed on her left side. The patient states that she has severe pain to the left hip. Has not been able to move that hip or put weight on it after the fall. The patient denies hitting her head or any other injury. Has history of right hip fracture in the past. Is on coumadin (For level 3, the HPI must include 4+ descriptors: Location, Quality, Severity, Duration, Timing, Context, modifying factors, associated signs/symptoms and/or status of 3+ chronic problems.)  (Please avoid self-populating past medical history here) (The initial 2-3 lines should be focused and good to copy and paste in the HPI section of the daily progress note).  ED Course: Hemodynamically stable. Routine lab unremarkable. INR 2.5. X-ray reveals displaced femoral neck fracture left. Patient seen in ED by Dr. Marry Guan for orthopedics. [facility] asked to admit patient for medical management with hip repair delayed for 1-2 days 2/2 elevated INR.  Review of Systems: As per HPI otherwise 10 point review of systems negative. Does c/o dry mouth, difficulty  hearing. Unacceptable ROS statements: "10 systems reviewed," "Extensive" (without elaboration).  Acceptable ROS statements: "All others negative," "All others reviewed and are negative," and "All others unremarkable," with at [place] documented Can't double dip - if using for HPI can't use for ROS  Past Medical History:  Diagnosis Date  . Allergy   . Atrial fibrillation ([place])   . CHF (congestive heart failure) ([place])   . History of squamous cell carcinoma   . Hyperlipidemia   . Hypertension   . Renal insufficiency   . Thyroid disease     Past Surgical History:  Procedure Laterality Date  . CHOLECYSTECTOMY    . HIP SURGERY     Soc Hx - married 62 years, no children. She and her husband live independently. 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   reports that she has never smoked. She has never used smokeless tobacco. She reports that she does not drink alcohol or use drugs.  Allergies  Allergen Reactions  . Fosamax [Alendronate Sodium] Nausea And Vomiting  . Other Rash  . Penicillins Nausea And Vomiting and Palpitations  . Tramadol Nausea And Vomiting and Palpitations    Family History  Problem Relation Age of Onset  . Breast cancer Mother 70  . Heart disease Father    Unacceptable: Noncontributory, unremarkable, or negative. Acceptable: Family history reviewed and not pertinent (If you reviewed it)  Prior to Admission medications   Medication Sig Start Date End Date Taking? Authorizing Provider  acetaminophen (TYLENOL) 500 MG tablet Take 500 mg by mouth  as needed for pain.   Yes [provider]  aspirin 81 MG tablet Take 81 mg by mouth daily.   Yes [provider]  Cholecalciferol 50 MCG (2000 UT) CAPS Take 2,000 Units by mouth daily.   Yes [provider]  doxepin (SINEQUAN) 10 MG capsule Take 10 mg by mouth daily.   Yes [provider]  esomeprazole (NEXIUM) 40 MG capsule Take 40 mg by mouth daily at 12  noon.   Yes [provider]  furosemide (LASIX) 20 MG tablet Take 40-60 mg by mouth 2 (two) times daily. 01/07/19  Yes [provider]  levothyroxine (SYNTHROID, LEVOTHROID) 100 MCG tablet Take 100 mcg by mouth daily before breakfast.   Yes [provider]  magnesium oxide (MAG-OX) 400 MG tablet Take 400 mg by mouth daily.   Yes [provider]  metoprolol succinate (TOPROL-XL) 50 MG 24 hr tablet Take 100 mg by mouth daily. Take with or immediately following a meal.   Yes [provider]  simvastatin (ZOCOR) 20 MG tablet Take 20 mg by mouth daily.   Yes [provider]  spironolactone (ALDACTONE) 25 MG tablet Take 25 mg by mouth daily.   Yes [provider]  traMADol (ULTRAM) 50 MG tablet Take 50 mg by mouth every 8 (eight) hours as needed. 01/27/19  Yes [provider]  warfarin (COUMADIN) 4 MG tablet Take 4 mg by mouth daily.   Yes [provider]    Physical Exam: Vitals:   02/09/19 1400 02/09/19 1600 02/09/19 1730 02/09/19 1926  BP: (!) 159/78 (!) 146/76  131/76  Pulse: 96 87 77 87  Resp: 16 17 13 14   Temp: 97.9 F (36.6 C)     TempSrc: Oral     SpO2: 93% 97% 98% 97%  Weight:        Constitutional: NAD, calm, comfortable Vitals:   02/09/19 1400 02/09/19 1600 02/09/19 1730 02/09/19 1926  BP: (!) 159/78 (!) 146/76  131/76  Pulse: 96 87 77 87  Resp: 16 17 13 14   Temp: 97.9 F (36.6 C)     TempSrc: Oral     SpO2: 93% 97% 98% 97%  Weight:       General: - very slim elderly woman in no distress but having pain from hip. Eyes: PERRL, lids and conjunctivae normal ENMT: Mucous membranes are moist. Posterior pharynx clear of any exudate or lesions.Normal dentition.  Neck: normal, supple, no masses, no thyromegaly Respiratory: clear to auscultation bilaterally, no wheezing, no crackles. Normal respiratory effort. No accessory muscle use.  Cardiovascular: IRIR with controlled rate, no JVD, quiet  precordium. II/VI sys mm best at left lower border, feint mm at apex, feint mm RSB. Abdomen: flat, no tenderness, no masses palpated. No hepatosplenomegaly. Bowel sounds positive.  Musculoskeletal: Interosseous wasting hands, varus angling of left leg, no other deformity noted. Skin: no rashes, lesions, ulcers. No induration Neurologic: CN 2-12 grossly intact with decreased hearing. Sensation intact. Strength 4/5 in all 4.  Psychiatric: Normal judgment and insight. Alert and oriented x 3. Normal mood.   (Anything < 9 systems with 2 bullets each down codes to level 1) (If patient refuses exam can't bill higher level) (Make sure to document decubitus ulcers present on admission -- if possible -- and whether patient has chronic indwelling catheter at time of admission)  Labs on Admission: I have personally reviewed following labs and imaging studies  CBC: Recent Labs  Lab 02/09/19 1653  WBC 10.8*  NEUTROABS  9.1*  HGB 14.0  HCT 42.3  MCV 94.8  PLT 161   Basic Metabolic Panel: Recent Labs  Lab 02/09/19 1653  NA 138  K 4.3  CL 96*  CO2 27  GLUCOSE 132*  BUN 26*  CREATININE 1.28*  CALCIUM 9.6   GFR: CrCl cannot be calculated (Unknown ideal weight.). Liver Function Tests: No results for input(s): AST, ALT, ALKPHOS, BILITOT, PROT, ALBUMIN in the last 168 hours. No results for input(s): LIPASE, AMYLASE in the last 168 hours. No results for input(s): AMMONIA in the last 168 hours. Coagulation Profile: Recent Labs  Lab 02/09/19 1653  INR 2.5*   Cardiac Enzymes: No results for input(s): CKTOTAL, CKMB, CKMBINDEX, TROPONINI in the last 168 hours. BNP (last 3 results) No results for input(s): PROBNP in the last 8760 hours. HbA1C: No results for input(s): HGBA1C in the last 72 hours. CBG: No results for input(s): GLUCAP in the last 168 hours. Lipid Profile: No results for input(s): CHOL, HDL, LDLCALC, TRIG, CHOLHDL, LDLDIRECT in the last 72 hours. Thyroid Function Tests: No  results for input(s): TSH, T4TOTAL, FREET4, T3FREE, THYROIDAB in the last 72 hours. Anemia Panel: No results for input(s): VITAMINB12, FOLATE, FERRITIN, TIBC, IRON, RETICCTPCT in the last 72 hours. Urine analysis: No results found for: COLORURINE, APPEARANCEUR, LABSPEC, PHURINE, GLUCOSEU, HGBUR, BILIRUBINUR, KETONESUR, PROTEINUR, UROBILINOGEN, NITRITE, LEUKOCYTESUR  Radiological Exams on Admission: DG Chest 1 View  Result Date: 02/09/2019 CLINICAL DATA:  Arrives with spouse who reports that patient fell at home and injured left hip. Patient C/O left hip pain. Unable to bear weight. Pain with any movement to left leg. EXAM: CHEST  1 VIEW COMPARISON:  08/25/2014 FINDINGS: There is mild bilateral interstitial thickening likely chronic. There is no focal consolidation. There is no pleural effusion or pneumothorax. The heart and mediastinal contours are unremarkable. There is no acute osseous abnormality. There is an S-shaped curvature of the thoracolumbar spine. IMPRESSION: No active disease. Electronically Signed   By: Kathreen Devoid   On: 02/09/2019 16:47   DG Hip Unilat W or Wo Pelvis 2-3 Views Left  Result Date: 02/09/2019 CLINICAL DATA:  Left hip pain EXAM: DG HIP (WITH OR WITHOUT PELVIS) 2-3V LEFT COMPARISON:  CT abdomen and pelvis from November of 2020 FINDINGS: Osteopenia with signs of previous right femoral neck ORIF with 3 lack type screws, unchanged. Old fractures about the symphysis pubis involving right pubic bone. Displaced femoral neck fracture on the left with varus deformity and superior translation of the femoral shaft relative to femoral head. Also with some potential posterior translation of the femoral shaft relative to femoral head and proximal femoral neck. IMPRESSION: Displaced left femoral neck fracture with resultant varus deformity as described. Electronically Signed   By: Zetta Bills M.D.   On: 02/09/2019 16:47    EKG: Independently reviewed. A fib at controlled rate, left  anterior fasicular block, no acute changes  Assessment/Plan Active Problems:   Hip fracture, left (HCC)   AI (aortic incompetence)   Atrial fibrillation (HCC)   Arteriosclerosis of coronary artery   Chronic kidney disease (CKD), stage III (moderate)   HLD (hyperlipidemia)   Osteoporosis, post-menopausal   S/p left hip fracture  (please populate well all problems here in Problem List. (For example, if patient is on BP meds at home and you resume or decide to hold them, it is a problem that needs to be her. Same for CAD, COPD, HLD and so on)  1. Hip fracture - displaced left femoral neck fracture  2/2 trauma. Plan Med-surg admit  Ortho for surgical repair - INR elevated, suspect delay of surgery for 1-2 days  Pain mgt - APAP, ketorolac 15 mg IV q6 prn (stable renal insufficiency)  2. CKD- creatinine 1.28, last in Epic 1.50. Stable  3. A. Fib - controlled rate. Has been on coumadin with INR 2.5 today Plan Hold coumadin, cover with lovenox  4. Cardio - known CAD but stable w/o symptoms. Known valvular disease but stable. No indication for telemetry monitoring.    DVT prophylaxis: lovenox (Lovenox/Heparin/SCD's/anticoagulated/None (if comfort care) Code Status:  Full code (Full/Partial (specify details) Family Communication: husband present during exam (Specify name, relationship. Do not write "discussed with patient". Specify tel # if discussed over the phone) Disposition Plan: to be determined - may need rehab (specify when and where you expect patient to be discharged) Consults called: Dr. Marry Guan for ortho (with names) Admission status: inpatient (inpatient / obs / tele / medical floor / SDU)   Adella Hare MD [facility] Pager [phone]  If 7PM-7AM, please contact night-coverage [email or web address] Password [facility]  02/09/2019, 7:51 PM

## 2019-02-09 NOTE — ED Provider Notes (Signed)
[facility] Emergency Department Provider Note ____________________________________________   I have reviewed the triage vital signs and the nursing notes.   HISTORY  Chief Complaint Left hip pain  History limited by: Not Limited   HPI Elizabeth Fields is a 86 y.o. female who presents to the emergency department today because of concern for left hip pain after a fall. The patient was trying to move some drinks on the floor with her foot when she fell over. Landed on her left side. The patient states that she has severe pain to the left hip. Has not been able to move that hip or put weight on it after the fall. The patient denies hitting her head or any other injury. Has history of right hip fracture in the past. Is on coumadin.   Records reviewed. Per medical record review patient has a history of atrial fibrillation, right hip fracture.   Past Medical History:  Diagnosis Date  . Allergy   . Atrial fibrillation ([place])   . CHF (congestive heart failure) ([place])   . History of squamous cell carcinoma   . Hyperlipidemia   . Hypertension   . Renal insufficiency   . Thyroid disease     Patient Active Problem List   Diagnosis Date Noted  . Compression fracture of L4 lumbar vertebra 09/18/2015  . Lumbar spondylosis 09/06/2015  . Chronic pain 09/06/2015  . Chronic sacroiliac joint pain (Left) 07/28/2015  . Arteriosclerosis of coronary artery 07/28/2015  . Bergmann's syndrome 07/28/2015  . HLD (hyperlipidemia) 07/28/2015  . Billowing mitral valve 07/28/2015  . Osteoporosis, post-menopausal 07/28/2015  . Lumbar facet syndrome (Bilateral) 07/28/2015  . Chronic low back pain (Bilateral) 07/28/2015  . Compression fracture of L1 lumbar vertebra ([place]) 07/28/2015  . Compression fracture of T12 vertebra ([place]) 07/28/2015  . Breathlessness on exertion 02/14/2015  . Vitamin B12 deficiency anemia 04/26/2014  . Acquired atrophy of thyroid 01/20/2014  . Chronic kidney  disease (CKD), stage III (moderate) 10/20/2013  . AI (aortic incompetence) 08/26/2013  . Beat, premature ventricular 08/26/2013  . History of cardiac catheterization 08/26/2013  . MI (mitral incompetence) 08/26/2013  . TI (tricuspid incompetence) 08/26/2013  . Atrial fibrillation ([place]) 05/05/2013    Past Surgical History:  Procedure Laterality Date  . CHOLECYSTECTOMY    . HIP SURGERY      Prior to Admission medications   Medication Sig Start Date End Date Taking? Authorizing Provider  aspirin 81 MG tablet Take 81 mg by mouth daily.    [provider]  doxepin (SINEQUAN) 10 MG capsule Take 10 mg by mouth 3 (three) times daily.    [provider]  esomeprazole (NEXIUM) 40 MG capsule Take 40 mg by mouth daily at 12 noon.    [provider]  furosemide (LASIX) 40 MG tablet Take 40 mg by mouth 2 (two) times daily as needed.    [provider]  levothyroxine (SYNTHROID, LEVOTHROID) 100 MCG tablet Take 100 mcg by mouth daily before breakfast.    [provider]  magnesium oxide (MAG-OX) 400 MG tablet Take 400 mg by mouth daily.    [provider]  metoprolol succinate (TOPROL-XL) 50 MG 24 hr tablet Take 100 mg by mouth daily. Take with or immediately following a meal.    [provider]  simvastatin (ZOCOR) 20 MG tablet Take 20 mg by mouth daily.    [provider]  spironolactone (ALDACTONE) 25 MG tablet Take 25 mg by mouth daily.    [provider]  warfarin (COUMADIN) 4 MG tablet Take 4 mg by mouth daily.    [provider]    Allergies Fosamax [alendronate sodium], Penicillins, and Tramadol  Family History  Problem Relation Age of Onset  . Breast cancer Mother 58  . Heart disease Father     Social History Social History   Tobacco Use  . Smoking status: Never Smoker  . Smokeless tobacco: Never Used  Substance Use Topics  . Alcohol use: No  . Drug use: No    Review of  Systems Constitutional: No fever/chills Eyes: No visual changes. ENT: No sore throat. Cardiovascular: Denies chest pain. Respiratory: Denies shortness of breath. Gastrointestinal: No abdominal pain.  No nausea, no vomiting.  No diarrhea.   Genitourinary: Negative for dysuria. Musculoskeletal: Positive for right hip pain. Skin: Negative for rash. Neurological: Negative for headaches, focal weakness or numbness.  ____________________________________________   PHYSICAL EXAM:  VITAL SIGNS: ED Triage Vitals  Enc Vitals Group     BP 02/09/19 1400 (!) 159/78     Pulse Rate 02/09/19 1400 96     Resp 02/09/19 1400 16     Temp 02/09/19 1400 97.9 F (36.6 C)     Temp Source 02/09/19 1400 Oral     SpO2 02/09/19 1400 93 %     Weight 02/09/19 1344 104 lb 0.9 oz (47.2 kg)     Height --      Head Circumference --      Peak Flow --      Pain Score 02/09/19 1344 6     Pain Loc --      Pain Edu? --      Excl. in [place]? --      Constitutional: Alert and oriented.  Eyes: Conjunctivae are normal.  ENT      Head: Normocephalic and atraumatic.      Nose: No congestion/rhinnorhea.      Mouth/Throat: Mucous membranes are moist.      Neck: No stridor. Hematological/Lymphatic/Immunilogical: No cervical lymphadenopathy. Cardiovascular: Normal rate, irregular rhythm.  No murmurs, rubs, or gallops. Respiratory: Normal respiratory effort without tachypnea nor retractions. Breath sounds are clear and equal bilaterally. No wheezes/rales/rhonchi. Gastrointestinal: Soft and non tender. No rebound. No guarding.  Genitourinary: Deferred Musculoskeletal: Positive for tenderness to manipulation and palpation of the left hip. DP 2+. Sensation intact distally. Neurologic:  Normal speech and language. No gross focal neurologic deficits are appreciated.  Skin:  Skin is warm, dry and intact. No rash noted. Psychiatric: Mood and affect are normal. Speech and behavior are normal. Patient exhibits appropriate  insight and judgment.  ____________________________________________    LABS (pertinent positives/negatives)  INR 2.5 BMP na 138, k 4.3, cl 96, glu 132, cr 1.28 CBC wbc 10.8, hgb 14.0, plt 267  ____________________________________________   EKG  I, Nance Pear, attending physician, personally viewed and interpreted this EKG  EKG Time: 1700 Rate: 87 Rhythm: atrial fibrillation Axis: left axis deviation Intervals: qtc 396 QRS: LAFB ST changes: no st elevation Impression: abnormal ekg ____________________________________________    RADIOLOGY  CXR No acute abnormality  Left hip Left hip fracture  ____________________________________________   PROCEDURES  Procedures  ____________________________________________   INITIAL IMPRESSION / ASSESSMENT AND PLAN / ED COURSE  Pertinent labs & imaging results that were available during my care of the patient were reviewed by me and considered in my medical decision making (see chart for details).   Patient presented to the hospital with left hip pain after a fall. On exam  patient is tender to manipulation and tenderness of the left hip. The patient does have external rotation and shortening. X-ray confirmed hip fracture. Discussed this with the patient. Did discuss with Dr. Marry Guan with orthopedics. Will plan on admission to the hospital.  ____________________________________________   FINAL CLINICAL IMPRESSION(S) / ED DIAGNOSES  Final diagnoses:  Closed fracture of left hip, initial encounter [facility])  Fall, initial encounter     Note: This dictation was prepared with Diplomatic Services operational officer dictation. Any transcriptional errors that result from this process are unintentional     Nance Pear, MD 02/09/19 1732

## 2019-02-10 ENCOUNTER — Other Ambulatory Visit: Payer: Self-pay

## 2019-02-10 DIAGNOSIS — E43 Unspecified severe protein-calorie malnutrition: Secondary | ICD-10-CM | POA: Insufficient documentation

## 2019-02-10 DIAGNOSIS — S72002D Fracture of unspecified part of neck of left femur, subsequent encounter for closed fracture with routine healing: Secondary | ICD-10-CM

## 2019-02-10 DIAGNOSIS — E039 Hypothyroidism, unspecified: Secondary | ICD-10-CM | POA: Diagnosis present

## 2019-02-10 LAB — CBC
HCT: 40.8 % (ref 36.0–46.0)
Hemoglobin: 13.4 g/dL (ref 12.0–15.0)
MCH: 31.1 pg (ref 26.0–34.0)
MCHC: 32.8 g/dL (ref 30.0–36.0)
MCV: 94.7 fL (ref 80.0–100.0)
Platelets: 225 10*3/uL (ref 150–400)
RBC: 4.31 MIL/uL (ref 3.87–5.11)
RDW: 14.6 % (ref 11.5–15.5)
WBC: 10 10*3/uL (ref 4.0–10.5)
nRBC: 0 % (ref 0.0–0.2)

## 2019-02-10 LAB — BASIC METABOLIC PANEL
Anion gap: 13 (ref 5–15)
BUN: 26 mg/dL — ABNORMAL HIGH (ref 8–23)
CO2: 27 mmol/L (ref 22–32)
Calcium: 9.1 mg/dL (ref 8.9–10.3)
Chloride: 98 mmol/L (ref 98–111)
Creatinine, Ser: 1.22 mg/dL — ABNORMAL HIGH (ref 0.44–1.00)
GFR calc Af Amer: 47 mL/min — ABNORMAL LOW (ref >60)
GFR calc non Af Amer: 40 mL/min — ABNORMAL LOW (ref >60)
Glucose, Bld: 122 mg/dL — ABNORMAL HIGH (ref 70–99)
Potassium: 3.9 mmol/L (ref 3.5–5.1)
Sodium: 138 mmol/L (ref 135–145)

## 2019-02-10 LAB — ABO/RH: ABO/RH(D): AB POS

## 2019-02-10 LAB — PROTIME-INR
INR: 2.8 — ABNORMAL HIGH (ref 0.8–1.2)
Prothrombin Time: 29.1 seconds — ABNORMAL HIGH (ref 11.4–15.2)

## 2019-02-10 LAB — TYPE AND SCREEN
ABO/RH(D): AB POS
Antibody Screen: NEGATIVE

## 2019-02-10 LAB — MAGNESIUM: Magnesium: 2.5 mg/dL — ABNORMAL HIGH (ref 1.7–2.4)

## 2019-02-10 MED ORDER — MAGNESIUM CITRATE PO SOLN
1.0000 | ORAL | Status: DC | PRN
  Filled 2019-02-10: qty 296

## 2019-02-10 MED ORDER — VITAMIN K1 1 MG/0.5ML IJ SOLN
1.0000 mg | Freq: Once | INTRAMUSCULAR | Status: AC
  Administered 2019-02-10: 1 mg via SUBCUTANEOUS
  Filled 2019-02-10: qty 0.5

## 2019-02-10 MED ORDER — METHOCARBAMOL 500 MG PO TABS
750.0000 mg | ORAL_TABLET | Freq: Four times a day (QID) | ORAL | Status: DC | PRN
  Administered 2019-02-10 – 2019-02-18 (×9): 750 mg via ORAL
  Filled 2019-02-10 (×9): qty 2

## 2019-02-10 MED ORDER — BISACODYL 5 MG PO TBEC: 5.0000 mg | DELAYED_RELEASE_TABLET | Freq: Every day | ORAL | Status: DC | PRN

## 2019-02-10 MED ORDER — POLYETHYLENE GLYCOL 3350 17 G PO PACK: 17.0000 g | PACK | Freq: Every day | ORAL | Status: DC | PRN

## 2019-02-10 MED ORDER — KETOROLAC TROMETHAMINE 15 MG/ML IJ SOLN
15.0000 mg | Freq: Three times a day (TID) | INTRAMUSCULAR | Status: AC | PRN
  Administered 2019-02-10 (×2): 15 mg via INTRAVENOUS
  Filled 2019-02-10 (×2): qty 1

## 2019-02-10 MED ORDER — ACETAMINOPHEN 500 MG PO TABS
1000.0000 mg | ORAL_TABLET | Freq: Three times a day (TID) | ORAL | Status: DC | PRN
  Administered 2019-02-10 – 2019-02-15 (×5): 1000 mg via ORAL
  Filled 2019-02-10 (×5): qty 2

## 2019-02-10 MED ORDER — ENSURE ENLIVE PO LIQD
237.0000 mL | Freq: Two times a day (BID) | ORAL | Status: DC
  Administered 2019-02-10 – 2019-02-18 (×10): 237 mL via ORAL

## 2019-02-10 NOTE — Progress Notes (Signed)
Nurse reported concern with IVF infusing 50 ml h in patient with a fib, chf and ckd.  Review of chart notes paient is NPO currently.  VSS. Enal function per lab review at baseline Review of meds, toradol discontinued and lasix held.  IVF stopped.  AM labs ordered

## 2019-02-10 NOTE — Progress Notes (Signed)
PROGRESS NOTE    Elizabeth Fields  MEQ:[id number] DOB: March 18, 1931 DOA: 02/09/2019  PCP: Baxter Hire, MD    LOS - 1   Brief Narrative:  86 y.o. female with medical history of A-Fib on warfarin, HTN, HLD, renal insufficiency, hypothyroidism, aortic and mitral valve disease, presented to the ED on 2/1 with left hip pain after a mechanical fall at home after which she was not able to move that hip or put weight on it the left leg. In the ED, vitals stable, labs unremarkable except INR 2.5.  Left hip x-ray showed a displaced femoral neck fracture.    Subjective 2/2: Patient seen this AM, husband at bedside.  She reports minimal pain as long as she is still, does have significant pain with movement.  Denies fever/chills, N/V/D, chest pain, SOB or other complaints.  She is hopeful her INR will be low enough to do surgery tomorrow.  Assessment & Plan:   Principal Problem:   Hip fracture, left (HCC) Active Problems:   Atrial fibrillation (HCC)   Chronic kidney disease (CKD), stage III (moderate)   Arteriosclerosis of coronary artery   HLD (hyperlipidemia)   Osteoporosis, post-menopausal   Hypothyroidism  Hip fracture, left  Displaced left femoral neck fracture secondary to mechanical fall. --Ortho following --surgery on hold until INR comes down --> Ortho gave IV vit K this afternoon --pain control - Tylenol, low dose IV Toradol (stable CKD, monitor renal function), oxycodone, morphine  --bowel regimen --PT eval post-op  Atrial fibrillation - rate controlled.  On warfarin. --INR 2.5 on admission --hold warfarin until after surgery --follow INR daily --continue Toprol  Coronary artery disease - stable, no chest pain Valvular Heart Disease - stable --no need for telemetry unless a-fib rate uncontrolled --continue spironolactone  HLD (hyperlipidemia) --continue simvastatin  Chronic kidney disease (CKD), stage III (moderate) - Stable. --Monitor renal function  daily  Osteoporosis, post-menopausal - with multiple fragility fractures including current left hip, prior right hip and multiple vertebral compression fractures.  Does not ppear to be on home medication for this. --recommend initiation of therapy ASAP, follow up with PCP --she takes vitamin D - continue  Hypothyroidism - continue Synthroid  GERD - continue Protonix   Other - continue Doxepin   DVT prophylaxis: Lovenox   Code Status: Full Code  Family Communication: Husband at bedside during encounter, updated and questions answered  Disposition Plan:  Pending surgery and PT evaluation.  Expect SNF will be needed.   Consultants:   Orthopedics  Procedures:   None  Antimicrobials:   None    Objective: Vitals:   02/09/19 2100 02/09/19 2109 02/09/19 2207 02/10/19 0755  BP: 136/75 136/75 133/65 (!) 103/57  Pulse:  84 86 85  Resp: 18 17 17 17   Temp:   (!) 97.5 F (36.4 C) 97.8 F (36.6 C)  TempSrc:   Oral Oral  SpO2:  98% 100% 95%  Weight:   47.2 kg   Height:   5\' 2"  (1.575 m)     Intake/Output Summary (Last 24 hours) at 02/10/2019 1423 Last data filed at 02/10/2019 1330 Gross per 24 hour  Intake 480 ml  Output --  Net 480 ml   Filed Weights   02/09/19 1344 02/09/19 2207  Weight: 47.2 kg 47.2 kg    Examination:  General exam: awake, alert, no acute distress, frail HEENT: clear conjunctiva, anicteric sclera, moist mucus membranes, hearing impaired  Respiratory system: clear to auscultation bilaterally, no wheezes, rales or rhonchi, normal respiratory  effort. Cardiovascular system: normal S1/S2, RRR, no pedal edema.   Gastrointestinal system: soft, non-tender, non-distended abdomen Central nervous system: alert and oriented x4. no gross focal neurologic deficits, normal speech Extremities: left lower extremity dressing/splint in place with pillow under left foot/leg, no cyanosis, normal tone Skin: dry, intact, normal temperature Psychiatry: normal mood,  congruent affect, judgement and insight appear normal    Data Reviewed: I have personally reviewed following labs and imaging studies  CBC: Recent Labs  Lab 02/09/19 1653 02/10/19 0348  WBC 10.8* 10.0  NEUTROABS 9.1*  --   HGB 14.0 13.4  HCT 42.3 40.8  MCV 94.8 94.7  PLT 267 782   Basic Metabolic Panel: Recent Labs  Lab 02/09/19 1653 02/10/19 0348  NA 138 138  K 4.3 3.9  CL 96* 98  CO2 27 27  GLUCOSE 132* 122*  BUN 26* 26*  CREATININE 1.28* 1.22*  CALCIUM 9.6 9.1  MG  --  2.5*   GFR: Estimated Creatinine Clearance: 25.1 mL/min (A) (by C-G formula based on SCr of 1.22 mg/dL (H)). Liver Function Tests: No results for input(s): AST, ALT, ALKPHOS, BILITOT, PROT, ALBUMIN in the last 168 hours. No results for input(s): LIPASE, AMYLASE in the last 168 hours. No results for input(s): AMMONIA in the last 168 hours. Coagulation Profile: Recent Labs  Lab 02/09/19 1653 02/10/19 0348  INR 2.5* 2.8*   Cardiac Enzymes: No results for input(s): CKTOTAL, CKMB, CKMBINDEX, TROPONINI in the last 168 hours. BNP (last 3 results) No results for input(s): PROBNP in the last 8760 hours. HbA1C: No results for input(s): HGBA1C in the last 72 hours. CBG: No results for input(s): GLUCAP in the last 168 hours. Lipid Profile: No results for input(s): CHOL, HDL, LDLCALC, TRIG, CHOLHDL, LDLDIRECT in the last 72 hours. Thyroid Function Tests: No results for input(s): TSH, T4TOTAL, FREET4, T3FREE, THYROIDAB in the last 72 hours. Anemia Panel: No results for input(s): VITAMINB12, FOLATE, FERRITIN, TIBC, IRON, RETICCTPCT in the last 72 hours. Sepsis Labs: No results for input(s): PROCALCITON, LATICACIDVEN in the last 168 hours.  Recent Results (from the past 240 hour(s))  Respiratory Panel by RT PCR (Flu A&B, Covid) - Nasopharyngeal Swab     Status: None   Collection Time: 02/09/19  5:59 PM   Specimen: Nasopharyngeal Swab  Result Value Ref Range Status   SARS Coronavirus 2 by RT PCR  NEGATIVE NEGATIVE Final    Comment: (NOTE) SARS-CoV-2 target nucleic acids are NOT DETECTED. The SARS-CoV-2 RNA is generally detectable in upper respiratoy specimens during the acute phase of infection. The lowest concentration of SARS-CoV-2 viral copies this assay can detect is 131 copies/mL. A negative result does not preclude SARS-Cov-2 infection and should not be used as the sole basis for treatment or other patient management decisions. A negative result may occur with  improper specimen collection/handling, submission of specimen other than nasopharyngeal swab, presence of viral mutation(s) within the areas targeted by this assay, and inadequate number of viral copies (<131 copies/mL). A negative result must be combined with clinical observations, patient history, and epidemiological information. The expected result is Negative. Fact Sheet for Patients:  PinkCheek.be Fact Sheet for Healthcare Providers:  GravelBags.it This test is not yet ap proved or cleared by the [place] FDA and  has been authorized for detection and/or diagnosis of SARS-CoV-2 by FDA under an Emergency Use Authorization (EUA). This EUA will remain  in effect (meaning this test can be used) for the duration of the COVID-19 declaration under Section 564(b)(1)  of the Act, 21 U.S.C. section 360bbb-3(b)(1), unless the authorization is terminated or revoked sooner.    Influenza A by PCR NEGATIVE NEGATIVE Final   Influenza B by PCR NEGATIVE NEGATIVE Final    Comment: (NOTE) The Xpert Xpress SARS-CoV-2/FLU/RSV assay is intended as an aid in  the diagnosis of influenza from Nasopharyngeal swab specimens and  should not be used as a sole basis for treatment. Nasal washings and  aspirates are unacceptable for Xpert Xpress SARS-CoV-2/FLU/RSV  testing. Fact Sheet for Patients: PinkCheek.be Fact Sheet for Healthcare  Providers: GravelBags.it This test is not yet approved or cleared by the [place] FDA and  has been authorized for detection and/or diagnosis of SARS-CoV-2 by  FDA under an Emergency Use Authorization (EUA). This EUA will remain  in effect (meaning this test can be used) for the duration of the  Covid-19 declaration under Section 564(b)(1) of the Act, 21  U.S.C. section 360bbb-3(b)(1), unless the authorization is  terminated or revoked. Performed at [facility], [address], [place], [place] [address]          Radiology Studies: DG Chest 1 View  Result Date: 02/09/2019 CLINICAL DATA:  Arrives with spouse who reports that patient fell at home and injured left hip. Patient C/O left hip pain. Unable to bear weight. Pain with any movement to left leg. EXAM: CHEST  1 VIEW COMPARISON:  08/25/2014 FINDINGS: There is mild bilateral interstitial thickening likely chronic. There is no focal consolidation. There is no pleural effusion or pneumothorax. The heart and mediastinal contours are unremarkable. There is no acute osseous abnormality. There is an S-shaped curvature of the thoracolumbar spine. IMPRESSION: No active disease. Electronically Signed   By: Kathreen Devoid   On: 02/09/2019 16:47   DG Hip Unilat W or Wo Pelvis 2-3 Views Left  Result Date: 02/09/2019 CLINICAL DATA:  Left hip pain EXAM: DG HIP (WITH OR WITHOUT PELVIS) 2-3V LEFT COMPARISON:  CT abdomen and pelvis from November of 2020 FINDINGS: Osteopenia with signs of previous right femoral neck ORIF with 3 lack type screws, unchanged. Old fractures about the symphysis pubis involving right pubic bone. Displaced femoral neck fracture on the left with varus deformity and superior translation of the femoral shaft relative to femoral head. Also with some potential posterior translation of the femoral shaft relative to femoral head and proximal femoral neck. IMPRESSION: Displaced left femoral neck  fracture with resultant varus deformity as described. Electronically Signed   By: Zetta Bills M.D.   On: 02/09/2019 16:47        Scheduled Meds: . doxepin  10 mg Oral Daily  . feeding supplement (ENSURE ENLIVE)  237 mL Oral BID BM  . levothyroxine  50 mcg Oral QAC breakfast  . magnesium oxide  400 mg Oral Daily  . metoprolol succinate  100 mg Oral Daily  . pantoprazole  40 mg Oral BID  . phytonadione  1 mg Subcutaneous Once  . senna  1 tablet Oral BID  . simvastatin  20 mg Oral Daily  . spironolactone  25 mg Oral Daily   Continuous Infusions:   LOS: 1 day    Time spent: 35-40 minutes    Ezekiel Slocumb, DO [facility]   If 7PM-7AM, please contact night-coverage [email or web address] 02/10/2019, 2:23 PM

## 2019-02-10 NOTE — TOC Initial Note (Signed)
Transition of Care [facility]) - Initial/Assessment Note    Patient Details  Name: Elizabeth Fields MRN: [id number] Date of Birth: Feb 24, 1931  Transition of [facility]) CM/SW Contact:    Elease Hashimoto, LCSW Phone Number: 02/10/2019, 9:59 AM  Clinical Narrative:  Met with pt who is waiting to have surgery due to INR too high. She lives with her husband who together they muddle through. He does have a bad back and can not do too much physical care. Pt has been through hip surgery before and it went very well and she is hopeful this will happen again. Pt wanted worker to come back when husband is here to visit. They do have good supports via extended family and friends. Pt was independent prior to admission. Husband does the driving. Pt does not want to go to a rehab but home. Will await surgery and recommendations by PT.               Expected Discharge Plan: [place] Barriers to Discharge: Continued Medical Work up   Patient Goals and CMS Choice Patient states their goals for this hospitalization and ongoing recovery are:: Wants to get back home with husband assisting her. He does have a bad back so needs to be careful      Expected Discharge Plan and Services Expected Discharge Plan: [place] In-house Referral: Clinical Social Work     Living arrangements for the past 2 months: Single Family Home                                      Prior Living Arrangements/Services Living arrangements for the past 2 months: Single Family Home Lives with:: Domestic Partner          Need for Family Participation in Patient Care: Yes (Comment) Care giver support system in place?: Yes (comment)      Activities of Daily Living Home Assistive Devices/Equipment: None ADL Screening (condition at time of admission) Patient's cognitive ability adequate to safely complete daily activities?: Yes Is the patient deaf or have difficulty hearing?: No Does the  patient have difficulty seeing, even when wearing glasses/contacts?: No Does the patient have difficulty concentrating, remembering, or making decisions?: No Patient able to express need for assistance with ADLs?: Yes Does the patient have difficulty dressing or bathing?: Yes Independently performs ADLs?: No Communication: Independent Dressing (OT): Dependent Is this a change from baseline?: Change from baseline, expected to last >3 days Grooming: Needs assistance Is this a change from baseline?: Change from baseline, expected to last >3 days Feeding: Independent Bathing: Needs assistance Is this a change from baseline?: Change from baseline, expected to last >3 days Toileting: Dependent Is this a change from baseline?: Change from baseline, expected to last >3days In/Out Bed: Dependent Is this a change from baseline?: Change from baseline, expected to last >3 days Walks in Home: Independent Does the patient have difficulty walking or climbing stairs?: Yes Weakness of Legs: Left Weakness of Arms/Hands: None  Permission Sought/Granted   Permission granted to share information with : Yes, Verbal Permission Granted  Share Information with NAME: Alveta Heimlich     Permission granted to share info w Relationship: Husband     Emotional Assessment Appearance:: Appears stated age Attitude/Demeanor/Rapport: Engaged Affect (typically observed): Calm, Adaptable Orientation: : Oriented to Self, Oriented to Place, Oriented to  Time, Oriented to Situation  Admission diagnosis:  Fall [W19.XXXA] Closed fracture of left hip, initial encounter ([place]) [S72.002A] Fall, initial encounter [W19.XXXA] S/p left hip fracture [Z87.81] Patient Active Problem List   Diagnosis Date Noted  . Hip fracture, left ([place]) 02/09/2019  . S/p left hip fracture 02/09/2019  . Compression fracture of L4 lumbar vertebra 09/18/2015  . Lumbar spondylosis 09/06/2015  . Chronic pain 09/06/2015  . Chronic sacroiliac joint  pain (Left) 07/28/2015  . Arteriosclerosis of coronary artery 07/28/2015  . Bergmann's syndrome 07/28/2015  . HLD (hyperlipidemia) 07/28/2015  . Billowing mitral valve 07/28/2015  . Osteoporosis, post-menopausal 07/28/2015  . Lumbar facet syndrome ([place]) 07/28/2015  . Chronic low back pain (Bilateral) 07/28/2015  . Compression fracture of L1 lumbar vertebra ([place]) 07/28/2015  . Compression fracture of T12 vertebra ([place]) 07/28/2015  . Breathlessness on exertion 02/14/2015  . Vitamin B12 deficiency anemia 04/26/2014  . Acquired atrophy of thyroid 01/20/2014  . Chronic kidney disease (CKD), stage III (moderate) 10/20/2013  . AI (aortic incompetence) 08/26/2013  . Beat, premature ventricular 08/26/2013  . History of cardiac catheterization 08/26/2013  . MI (mitral incompetence) 08/26/2013  . TI (tricuspid incompetence) 08/26/2013  . Atrial fibrillation ([place]) 05/05/2013   PCP:  Baxter Hire, MD Pharmacy:   [facility], [address] [place] [address] Phone: [phone] Fax: [place], [place] [address] [place] [address] Phone: [phone] Fax: [phone]     Social Determinants of Health (SDOH) Interventions    Readmission Risk Interventions No flowsheet data found.

## 2019-02-10 NOTE — TOC Progression Note (Signed)
Transition of Care [facility]) - Progression Note    Patient Details  Name: Elizabeth Fields MRN: [id number] Date of Birth: Jun 16, 1931  Transition of Care [facility]) CM/SW Contact  Terrilee Dudzik, Gardiner Rhyme, LCSW Phone Number: 02/10/2019, 2:25 PM  Clinical Narrative:  Met with husband and pt when here. He is also hoping pt can go home after surgery with home health. Pt is hoping she has surgery tomorrow she feels it is awful to just lay in the bed. She wants to get up and move around but understands she can not right now. Her husband reports they have plenty of rw, canes and rails on their stairs. Will follow along for discharge needs.    Expected Discharge Plan: [place] Barriers to Discharge: Continued Medical Work up  Expected Discharge Plan and Services Expected Discharge Plan: [place] In-house Referral: Clinical Social Work     Living arrangements for the past 2 months: Single Family Home                                       Social Determinants of Health (SDOH) Interventions    Readmission Risk Interventions No flowsheet data found.

## 2019-02-10 NOTE — Progress Notes (Signed)
INR elevated from admission, will need to cancel surgery today,will put on schedule for tomorrow.

## 2019-02-10 NOTE — Progress Notes (Signed)
Initial Nutrition Assessment  DOCUMENTATION CODES:   Severe malnutrition in context of chronic illness  INTERVENTION:  Provide Ensure Enlive po BID, each supplement provides 350 kcal and 20 grams of protein.  Provide Magic cup TID with meals, each supplement provides 290 kcal and 9 grams of protein.  Encouraged adequate intake of calories and protein at meals.  Recommend obtaining accurate weight prior to surgery. Patient's current "weight" was just copied forward from 2017.  NUTRITION DIAGNOSIS:   Severe Malnutrition related to chronic illness(CHF, inadequate oral intake) as evidenced by severe fat depletion, severe muscle depletion.  GOAL:   Patient will meet greater than or equal to 90% of their needs  MONITOR:   PO intake, Supplement acceptance, Labs, Weight trends, Skin, I & O's  REASON FOR ASSESSMENT:   Consult Assessment of nutrition requirement/status  ASSESSMENT:   86 year old female with PMHx of A-fib, HLD, HTN, CHF, hypothyroidism, renal insufficiency admitted with displaced left femoral neck fracture.   -Surgery is being delayed due to elevated INR.  Met with patient and her husband at bedside. Patient reports her appetite is unchanged from baseline but that she does not eat much at meals. Her husband confirms that she eats very little during the day. Patient reports that she had a "large" breakfast but according to chart she only ate bites. She was working on lunch at time of RD assessment. At home she reports they may get takeout such as [employer] or BBQ and she eats small amounts. Discussed importance of eating adequate calories and protein at meals and increased nutrient needs for healing. Patient is amenable to drinking ONS to help meet calorie/protein needs.  Patient reports her UBW is 101 lbs and she had lost down to 93 lbs recently but is unsure of exact time frame. However the weight documented is 47.2 kg (104.06 lbs) which appears to have just been copied  forward from her last weight in chart which was 09/29/2015.   Medications reviewed and include: levothyroxine, magnesium oxide 400 mg daily, pantoprazole, senna 1 tablet BID, spironolactone.  Labs reviewed: BUN 26, Creatinine 1.22.  Discussed with RN.  NUTRITION - FOCUSED PHYSICAL EXAM:    Most Recent Value  Orbital Region  Severe depletion  Upper Arm Region  Severe depletion  Thoracic and Lumbar Region  Severe depletion  Buccal Region  Severe depletion  Temple Region  Severe depletion  Clavicle Bone Region  Severe depletion  Clavicle and Acromion Bone Region  Severe depletion  Scapular Bone Region  Unable to assess  Dorsal Hand  Severe depletion  Patellar Region  Severe depletion  Anterior Thigh Region  Severe depletion  Posterior Calf Region  Severe depletion  Edema (RD Assessment)  None  Hair  Reviewed  Eyes  Reviewed  Mouth  Reviewed  Skin  Reviewed  Nails  Reviewed     Diet Order:   Diet Order            Diet regular Room service appropriate? Yes; Fluid consistency: Thin  Diet effective now             EDUCATION NEEDS:   Education needs have been addressed  Skin:  Skin Assessment: Reviewed RN Assessment  Last BM:  02/10/2019 - medium type 4  Height:   Ht Readings from Last 1 Encounters:  02/09/19 '5\' 2"'  (1.575 m)   Weight:   Wt Readings from Last 1 Encounters:  02/09/19 47.2 kg   Ideal Body Weight:  50 kg  BMI:  Body mass index is 19.03 kg/m.  Estimated Nutritional Needs:   Kcal:  1200-1400  Protein:  60-70 grams  Fluid:  1.2-1.4 L/day  Jacklynn Barnacle, MS, RD, LDN Office: [phone] Pager: [phone] After Hours/Weekend Pager: [phone]

## 2019-02-11 DIAGNOSIS — N179 Acute kidney failure, unspecified: Secondary | ICD-10-CM

## 2019-02-11 DIAGNOSIS — N189 Chronic kidney disease, unspecified: Secondary | ICD-10-CM

## 2019-02-11 DIAGNOSIS — E785 Hyperlipidemia, unspecified: Secondary | ICD-10-CM

## 2019-02-11 LAB — BASIC METABOLIC PANEL
Anion gap: 13 (ref 5–15)
BUN: 38 mg/dL — ABNORMAL HIGH (ref 8–23)
CO2: 27 mmol/L (ref 22–32)
Calcium: 9.3 mg/dL (ref 8.9–10.3)
Chloride: 100 mmol/L (ref 98–111)
Creatinine, Ser: 1.67 mg/dL — ABNORMAL HIGH (ref 0.44–1.00)
GFR calc Af Amer: 32 mL/min — ABNORMAL LOW (ref >60)
GFR calc non Af Amer: 28 mL/min — ABNORMAL LOW (ref >60)
Glucose, Bld: 111 mg/dL — ABNORMAL HIGH (ref 70–99)
Potassium: 3.7 mmol/L (ref 3.5–5.1)
Sodium: 140 mmol/L (ref 135–145)

## 2019-02-11 LAB — CBC
HCT: 38.9 % (ref 36.0–46.0)
Hemoglobin: 13.2 g/dL (ref 12.0–15.0)
MCH: 31.7 pg (ref 26.0–34.0)
MCHC: 33.9 g/dL (ref 30.0–36.0)
MCV: 93.3 fL (ref 80.0–100.0)
Platelets: 208 10*3/uL (ref 150–400)
RBC: 4.17 MIL/uL (ref 3.87–5.11)
RDW: 14.7 % (ref 11.5–15.5)
WBC: 10.2 10*3/uL (ref 4.0–10.5)
nRBC: 0 % (ref 0.0–0.2)

## 2019-02-11 LAB — PROTIME-INR
INR: 2.2 — ABNORMAL HIGH (ref 0.8–1.2)
Prothrombin Time: 24.5 seconds — ABNORMAL HIGH (ref 11.4–15.2)

## 2019-02-11 LAB — MAGNESIUM: Magnesium: 2.5 mg/dL — ABNORMAL HIGH (ref 1.7–2.4)

## 2019-02-11 MED ORDER — VITAMIN K1 10 MG/ML IJ SOLN
5.0000 mg | Freq: Once | INTRAVENOUS | Status: AC
  Administered 2019-02-11: 5 mg via INTRAVENOUS
  Filled 2019-02-11: qty 0.5

## 2019-02-11 MED ORDER — SODIUM CHLORIDE 0.45 % IV SOLN: INTRAVENOUS | Status: DC

## 2019-02-11 MED ORDER — VITAMIN K1 10 MG/ML IJ SOLN
5.0000 mg | Freq: Once | INTRAMUSCULAR | Status: DC
  Filled 2019-02-11: qty 0.5

## 2019-02-11 MED ORDER — CLINDAMYCIN PHOSPHATE 600 MG/50ML IV SOLN
600.0000 mg | INTRAVENOUS | Status: AC
  Administered 2019-02-12: 11:00:00 600 mg via INTRAVENOUS
  Filled 2019-02-11: qty 50

## 2019-02-11 NOTE — Progress Notes (Signed)
PROGRESS NOTE    Elizabeth Fields  QJF:[id number] DOB: [birth year] DOA: 02/09/2019 PCP: Baxter Hire, MD    Brief Narrative:  86 y.o.femalewith medical history ofA-Fib on warfarin, HTN, HLD, renal insufficiency, hypothyroidism, aortic and mitral valve disease, presented to the ED on 2/1 with left hip pain after a mechanical fall at home after which she was not able to move that hip or put weight on it the left leg. In the ED, vitals stable, labs unremarkable except INR 2.5.  Left hip x-ray showed a displaced femoral neck fracture.      Consultants:   Orthopedics  Procedures: None  Antimicrobials:   None   Subjective: Patient was on the phone earlier.  She reports pain is controlled.  Denies any shortness of breath, chest pain, or any other symptoms.  Objective: Vitals:   02/10/19 1449 02/10/19 2336 02/11/19 0500 02/11/19 0803  BP:  (!) 128/56  121/66  Pulse:  98  (!) 102  Resp:  12  17  Temp:  98 F (36.7 C)  98.9 F (37.2 C)  TempSrc:  Oral  Oral  SpO2:  91%  95%  Weight: 46.7 kg  51.2 kg   Height:        Intake/Output Summary (Last 24 hours) at 02/11/2019 1301 Last data filed at 02/11/2019 0900 Gross per 24 hour  Intake 720 ml  Output --  Net 720 ml   Filed Weights   02/09/19 2207 02/10/19 1449 02/11/19 0500  Weight: 47.2 kg 46.7 kg 51.2 kg    Examination:  General exam: Appears calm and comfortable, NAD, sitting up in bed Respiratory system: Clear to auscultation. Respiratory effort normal. Cardiovascular system: S1 & S2 heard, RRR. No JVD, murmurs, rubs, gallops or clicks.  Gastrointestinal system: Abdomen is nondistended, soft and nontender.  Normal bowel sounds heard. Central nervous system: Alert and oriented.  Grossly intact Extremities: No edema, LLE in splint Skin: Warm dry Psychiatry: Judgement and insight appear normal. Mood & affect appropriate.     Data Reviewed: I have personally reviewed following labs and imaging studies  CBC: Recent  Labs  Lab 02/09/19 1653 02/10/19 0348 02/11/19 0634  WBC 10.8* 10.0 10.2  NEUTROABS 9.1*  --   --   HGB 14.0 13.4 13.2  HCT 42.3 40.8 38.9  MCV 94.8 94.7 93.3  PLT 267 225 893   Basic Metabolic Panel: Recent Labs  Lab 02/09/19 1653 02/10/19 0348 02/11/19 0634  NA 138 138 140  K 4.3 3.9 3.7  CL 96* 98 100  CO2 27 27 27   GLUCOSE 132* 122* 111*  BUN 26* 26* 38*  CREATININE 1.28* 1.22* 1.67*  CALCIUM 9.6 9.1 9.3  MG  --  2.5* 2.5*   GFR: Estimated Creatinine Clearance: 19.5 mL/min (A) (by C-G formula based on SCr of 1.67 mg/dL (H)). Liver Function Tests: No results for input(s): AST, ALT, ALKPHOS, BILITOT, PROT, ALBUMIN in the last 168 hours. No results for input(s): LIPASE, AMYLASE in the last 168 hours. No results for input(s): AMMONIA in the last 168 hours. Coagulation Profile: Recent Labs  Lab 02/09/19 1653 02/10/19 0348 02/11/19 0634  INR 2.5* 2.8* 2.2*   Cardiac Enzymes: No results for input(s): CKTOTAL, CKMB, CKMBINDEX, TROPONINI in the last 168 hours. BNP (last 3 results) No results for input(s): PROBNP in the last 8760 hours. HbA1C: No results for input(s): HGBA1C in the last 72 hours. CBG: No results for input(s): GLUCAP in the last 168 hours. Lipid Profile: No results for  input(s): CHOL, HDL, LDLCALC, TRIG, CHOLHDL, LDLDIRECT in the last 72 hours. Thyroid Function Tests: No results for input(s): TSH, T4TOTAL, FREET4, T3FREE, THYROIDAB in the last 72 hours. Anemia Panel: No results for input(s): VITAMINB12, FOLATE, FERRITIN, TIBC, IRON, RETICCTPCT in the last 72 hours. Sepsis Labs: No results for input(s): PROCALCITON, LATICACIDVEN in the last 168 hours.  Recent Results (from the past 240 hour(s))  Respiratory Panel by RT PCR (Flu A&B, Covid) - Nasopharyngeal Swab     Status: None   Collection Time: 02/09/19  5:59 PM   Specimen: Nasopharyngeal Swab  Result Value Ref Range Status   SARS Coronavirus 2 by RT PCR NEGATIVE NEGATIVE Final    Comment:  (NOTE) SARS-CoV-2 target nucleic acids are NOT DETECTED. The SARS-CoV-2 RNA is generally detectable in upper respiratoy specimens during the acute phase of infection. The lowest concentration of SARS-CoV-2 viral copies this assay can detect is 131 copies/mL. A negative result does not preclude SARS-Cov-2 infection and should not be used as the sole basis for treatment or other patient management decisions. A negative result may occur with  improper specimen collection/handling, submission of specimen other than nasopharyngeal swab, presence of viral mutation(s) within the areas targeted by this assay, and inadequate number of viral copies (<131 copies/mL). A negative result must be combined with clinical observations, patient history, and epidemiological information. The expected result is Negative. Fact Sheet for Patients:  PinkCheek.be Fact Sheet for Healthcare Providers:  GravelBags.it This test is not yet ap proved or cleared by the [place] FDA and  has been authorized for detection and/or diagnosis of SARS-CoV-2 by FDA under an Emergency Use Authorization (EUA). This EUA will remain  in effect (meaning this test can be used) for the duration of the COVID-19 declaration under Section 564(b)(1) of the Act, 21 U.S.C. section 360bbb-3(b)(1), unless the authorization is terminated or revoked sooner.    Influenza A by PCR NEGATIVE NEGATIVE Final   Influenza B by PCR NEGATIVE NEGATIVE Final    Comment: (NOTE) The Xpert Xpress SARS-CoV-2/FLU/RSV assay is intended as an aid in  the diagnosis of influenza from Nasopharyngeal swab specimens and  should not be used as a sole basis for treatment. Nasal washings and  aspirates are unacceptable for Xpert Xpress SARS-CoV-2/FLU/RSV  testing. Fact Sheet for Patients: PinkCheek.be Fact Sheet for Healthcare  Providers: GravelBags.it This test is not yet approved or cleared by the [place] FDA and  has been authorized for detection and/or diagnosis of SARS-CoV-2 by  FDA under an Emergency Use Authorization (EUA). This EUA will remain  in effect (meaning this test can be used) for the duration of the  Covid-19 declaration under Section 564(b)(1) of the Act, 21  U.S.C. section 360bbb-3(b)(1), unless the authorization is  terminated or revoked. Performed at [facility], [address], [place], [place] [address]          Radiology Studies: DG Chest 1 View  Result Date: 02/09/2019 CLINICAL DATA:  Arrives with spouse who reports that patient fell at home and injured left hip. Patient C/O left hip pain. Unable to bear weight. Pain with any movement to left leg. EXAM: CHEST  1 VIEW COMPARISON:  08/25/2014 FINDINGS: There is mild bilateral interstitial thickening likely chronic. There is no focal consolidation. There is no pleural effusion or pneumothorax. The heart and mediastinal contours are unremarkable. There is no acute osseous abnormality. There is an S-shaped curvature of the thoracolumbar spine. IMPRESSION: No active disease. Electronically Signed   By: Elbert Ewings  Patel   On: 02/09/2019 16:47   DG Hip Unilat W or Wo Pelvis 2-3 Views Left  Result Date: 02/09/2019 CLINICAL DATA:  Left hip pain EXAM: DG HIP (WITH OR WITHOUT PELVIS) 2-3V LEFT COMPARISON:  CT abdomen and pelvis from November of 2020 FINDINGS: Osteopenia with signs of previous right femoral neck ORIF with 3 lack type screws, unchanged. Old fractures about the symphysis pubis involving right pubic bone. Displaced femoral neck fracture on the left with varus deformity and superior translation of the femoral shaft relative to femoral head. Also with some potential posterior translation of the femoral shaft relative to femoral head and proximal femoral neck. IMPRESSION: Displaced left femoral neck  fracture with resultant varus deformity as described. Electronically Signed   By: Zetta Bills M.D.   On: 02/09/2019 16:47        Scheduled Meds: . doxepin  10 mg Oral Daily  . feeding supplement (ENSURE ENLIVE)  237 mL Oral BID BM  . levothyroxine  50 mcg Oral QAC breakfast  . magnesium oxide  400 mg Oral Daily  . metoprolol succinate  100 mg Oral Daily  . pantoprazole  40 mg Oral BID  . senna  1 tablet Oral BID  . simvastatin  20 mg Oral Daily  . spironolactone  25 mg Oral Daily   Continuous Infusions: . [START ON 02/12/2019] clindamycin (CLEOCIN) IV      Assessment & Plan:   Principal Problem:   Hip fracture, left (HCC) Active Problems:   Atrial fibrillation (HCC)   Arteriosclerosis of coronary artery   Chronic kidney disease (CKD), stage III (moderate)   HLD (hyperlipidemia)   Osteoporosis, post-menopausal   Hypothyroidism   Protein-calorie malnutrition, severe   Hip fracture, left  Displaced left femoral neck fracture secondary to mechanical fall. --Ortho following --surgery on hold until INR comes down --> Ortho gave IV vit K already. INR 2.2. no surgery today, possible tomorrow. --pain control - Tylenol, low dose IV Toradol (stable CKD, monitor renal function), oxycodone, morphine  --bowel regimen --PT eval post-op  Atrial fibrillation - rate controlled.  On warfarin. --a/c on hold for surgery --follow INR daily --continue Toprol  Coronary artery disease - stable, no chest pain Valvular Heart Disease - stable --no need for telemetry unless a-fib rate uncontrolled --continue spironolactone..> hold since with a/cki  HLD (hyperlipidemia) --continue simvastatin  Acute on Chronic kidney disease (CKD), stage III (moderate) - Stable. --creatinine increased.  -Hold Aldactone for now Start gentle hydration Monitor volume status and renal function  Osteoporosis, post-menopausal - with multiple fragility fractures including current left hip, prior right  hip and multiple vertebral compression fractures.  Does not ppear to be on home medication for this. --recommend initiation of therapy ASAP, follow up with PCP --Continue her home vitamin D vitamin D  Hypothyroidism - continue Synthroid  GERD - continue Protonix   Other - continue Doxepin   DVT prophylaxis: Lovenox   Code Status: Full Code  Family Communication:  None at bedside  Disposition Plan:  Pending surgery and PT evaluation.  Expect SNF will be needed.      LOS: 2 days   Time spent: 45 minutes with more than 50% COC    Nolberto Hanlon, MD [facility] Pager [phone]  If 7PM-7AM, please contact night-coverage [email or web address] Password [email or web address] 02/11/2019, 1:01 PM

## 2019-02-11 NOTE — Progress Notes (Signed)
INR still elevated, hope it will normalize for OR tomorrow

## 2019-02-11 NOTE — Progress Notes (Signed)
md cancelled  Surgery for today pts inr 2.2

## 2019-02-12 ENCOUNTER — Encounter
Admission: EM | Disposition: A | Discharge status: Home Health | Payer: Self-pay | Source: Home / Self Care | Attending: Internal Medicine

## 2019-02-12 ENCOUNTER — Inpatient Hospital Stay: Payer: PPO

## 2019-02-12 ENCOUNTER — Inpatient Hospital Stay: Payer: PPO | Admitting: Anesthesiology

## 2019-02-12 ENCOUNTER — Encounter: Payer: Self-pay | Admitting: Internal Medicine

## 2019-02-12 DIAGNOSIS — W19XXXA Unspecified fall, initial encounter: Secondary | ICD-10-CM

## 2019-02-12 HISTORY — PX: ANTERIOR APPROACH HEMI HIP ARTHROPLASTY: SHX6690

## 2019-02-12 LAB — CBC
HCT: 37.8 % (ref 36.0–46.0)
Hemoglobin: 12.5 g/dL (ref 12.0–15.0)
MCH: 31.3 pg (ref 26.0–34.0)
MCHC: 33.1 g/dL (ref 30.0–36.0)
MCV: 94.7 fL (ref 80.0–100.0)
Platelets: 172 10*3/uL (ref 150–400)
RBC: 3.99 MIL/uL (ref 3.87–5.11)
RDW: 14.8 % (ref 11.5–15.5)
WBC: 10.3 10*3/uL (ref 4.0–10.5)
nRBC: 0 % (ref 0.0–0.2)

## 2019-02-12 LAB — BASIC METABOLIC PANEL
Anion gap: 11 (ref 5–15)
Anion gap: 15 (ref 5–15)
BUN: 29 mg/dL — ABNORMAL HIGH (ref 8–23)
BUN: 26 mg/dL — ABNORMAL HIGH (ref 8–23)
CO2: 25 mmol/L (ref 22–32)
CO2: 18 mmol/L — ABNORMAL LOW (ref 22–32)
Calcium: 8.8 mg/dL — ABNORMAL LOW (ref 8.9–10.3)
Calcium: 8.3 mg/dL — ABNORMAL LOW (ref 8.9–10.3)
Chloride: 101 mmol/L (ref 98–111)
Chloride: 104 mmol/L (ref 98–111)
Creatinine, Ser: 1.39 mg/dL — ABNORMAL HIGH (ref 0.44–1.00)
Creatinine, Ser: 1.11 mg/dL — ABNORMAL HIGH (ref 0.44–1.00)
GFR calc Af Amer: 40 mL/min — ABNORMAL LOW (ref >60)
GFR calc Af Amer: 52 mL/min — ABNORMAL LOW (ref >60)
GFR calc non Af Amer: 34 mL/min — ABNORMAL LOW (ref >60)
GFR calc non Af Amer: 45 mL/min — ABNORMAL LOW (ref >60)
Glucose, Bld: 106 mg/dL — ABNORMAL HIGH (ref 70–99)
Glucose, Bld: 100 mg/dL — ABNORMAL HIGH (ref 70–99)
Potassium: 3.8 mmol/L (ref 3.5–5.1)
Potassium: 4.3 mmol/L (ref 3.5–5.1)
Sodium: 137 mmol/L (ref 135–145)
Sodium: 137 mmol/L (ref 135–145)

## 2019-02-12 LAB — PROTIME-INR
INR: 1.3 — ABNORMAL HIGH (ref 0.8–1.2)
Prothrombin Time: 16.3 seconds — ABNORMAL HIGH (ref 11.4–15.2)

## 2019-02-12 LAB — SURGICAL PCR SCREEN
MRSA, PCR: NEGATIVE
Staphylococcus aureus: NEGATIVE

## 2019-02-12 SURGERY — HEMIARTHROPLASTY, HIP, DIRECT ANTERIOR APPROACH, FOR FRACTURE
Anesthesia: Spinal | Laterality: Left

## 2019-02-12 MED ORDER — BUPIVACAINE-EPINEPHRINE 0.25% -1:200000 IJ SOLN
INTRAMUSCULAR | Status: DC | PRN
  Administered 2019-02-12: 30 mL

## 2019-02-12 MED ORDER — SODIUM CHLORIDE 0.9 % IV SOLN: INTRAVENOUS | Status: DC

## 2019-02-12 MED ORDER — LACTATED RINGERS IV BOLUS
500.0000 mL | Freq: Once | INTRAVENOUS | Status: AC
  Administered 2019-02-12: 500 mL via INTRAVENOUS

## 2019-02-12 MED ORDER — PHENOL 1.4 % MT LIQD
1.0000 | OROMUCOSAL | Status: DC | PRN
  Filled 2019-02-12: qty 177

## 2019-02-12 MED ORDER — METOCLOPRAMIDE HCL 5 MG/ML IJ SOLN: 5.0000 mg | Freq: Three times a day (TID) | INTRAMUSCULAR | Status: DC | PRN

## 2019-02-12 MED ORDER — ENOXAPARIN SODIUM 40 MG/0.4ML ~~LOC~~ SOLN: 40.0000 mg | SUBCUTANEOUS | Status: DC

## 2019-02-12 MED ORDER — LACTATED RINGERS IV SOLN: Freq: Once | INTRAVENOUS | Status: AC

## 2019-02-12 MED ORDER — FLUMAZENIL 0.5 MG/5ML IV SOLN
0.1000 mg | Freq: Once | INTRAVENOUS | Status: AC
  Administered 2019-02-12: 0.1 mg via INTRAVENOUS

## 2019-02-12 MED ORDER — SODIUM CHLORIDE 0.9 % IV SOLN
INTRAVENOUS | Status: DC | PRN
  Administered 2019-02-12: 60 mL

## 2019-02-12 MED ORDER — ONDANSETRON HCL 4 MG/2ML IJ SOLN: 4.0000 mg | Freq: Four times a day (QID) | INTRAMUSCULAR | Status: DC | PRN

## 2019-02-12 MED ORDER — ONDANSETRON HCL 4 MG PO TABS: 4.0000 mg | ORAL_TABLET | Freq: Four times a day (QID) | ORAL | Status: DC | PRN

## 2019-02-12 MED ORDER — ENOXAPARIN SODIUM 30 MG/0.3ML ~~LOC~~ SOLN: 30.0000 mg | SUBCUTANEOUS | Status: DC

## 2019-02-12 MED ORDER — DOCUSATE SODIUM 100 MG PO CAPS
100.0000 mg | ORAL_CAPSULE | Freq: Two times a day (BID) | ORAL | Status: DC
  Administered 2019-02-12 – 2019-02-18 (×7): 100 mg via ORAL
  Filled 2019-02-12 (×8): qty 1

## 2019-02-12 MED ORDER — LACTATED RINGERS IV SOLN
INTRAVENOUS | Status: DC | PRN
  Administered 2019-02-12: 1000 mL via INTRAVENOUS

## 2019-02-12 MED ORDER — WARFARIN - PHARMACIST DOSING INPATIENT: Freq: Every day | Status: DC

## 2019-02-12 MED ORDER — MIDAZOLAM HCL 5 MG/5ML IJ SOLN
INTRAMUSCULAR | Status: DC | PRN
  Administered 2019-02-12: 2 mg via INTRAVENOUS

## 2019-02-12 MED ORDER — ONDANSETRON HCL 4 MG/2ML IJ SOLN: 4.0000 mg | Freq: Once | INTRAMUSCULAR | Status: DC | PRN

## 2019-02-12 MED ORDER — ENOXAPARIN SODIUM 60 MG/0.6ML ~~LOC~~ SOLN
50.0000 mg | SUBCUTANEOUS | Status: DC
  Administered 2019-02-13 – 2019-02-14 (×2): 50 mg via SUBCUTANEOUS
  Filled 2019-02-12 (×3): qty 0.6

## 2019-02-12 MED ORDER — PROPOFOL 500 MG/50ML IV EMUL
INTRAVENOUS | Status: DC | PRN
  Administered 2019-02-12: 50 ug/kg/min via INTRAVENOUS

## 2019-02-12 MED ORDER — SODIUM CHLORIDE 0.9 % IV SOLN
INTRAVENOUS | Status: DC | PRN
  Administered 2019-02-12: 200 ug/min via INTRAVENOUS

## 2019-02-12 MED ORDER — FLUMAZENIL 0.5 MG/5ML IV SOLN
INTRAVENOUS | Status: AC
  Administered 2019-02-12: 13:00:00 0.2 mg via INTRAVENOUS
  Filled 2019-02-12: qty 5

## 2019-02-12 MED ORDER — PROPOFOL 10 MG/ML IV BOLUS
INTRAVENOUS | Status: AC
  Filled 2019-02-12: qty 20

## 2019-02-12 MED ORDER — MENTHOL 3 MG MT LOZG
1.0000 | LOZENGE | OROMUCOSAL | Status: DC | PRN
  Filled 2019-02-12: qty 9

## 2019-02-12 MED ORDER — PHENYLEPHRINE 40 MCG/ML (10ML) SYRINGE FOR IV PUSH (FOR BLOOD PRESSURE SUPPORT)
PREFILLED_SYRINGE | INTRAVENOUS | Status: AC
  Administered 2019-02-12: 14:00:00 100 ug via INTRAVENOUS
  Filled 2019-02-12: qty 10

## 2019-02-12 MED ORDER — CHLORHEXIDINE GLUCONATE CLOTH 2 % EX PADS
6.0000 | MEDICATED_PAD | Freq: Every day | CUTANEOUS | Status: DC
  Administered 2019-02-14 – 2019-02-16 (×3): 6 via TOPICAL

## 2019-02-12 MED ORDER — ENOXAPARIN SODIUM 40 MG/0.4ML ~~LOC~~ SOLN
40.0000 mg | SUBCUTANEOUS | Status: AC
  Administered 2019-02-12: 19:00:00 40 mg via SUBCUTANEOUS
  Filled 2019-02-12: qty 0.4

## 2019-02-12 MED ORDER — MAGNESIUM HYDROXIDE 400 MG/5ML PO SUSP: 30.0000 mL | Freq: Every day | ORAL | Status: DC | PRN

## 2019-02-12 MED ORDER — BUPIVACAINE HCL (PF) 0.5 % IJ SOLN
INTRAMUSCULAR | Status: AC
  Filled 2019-02-12: qty 30

## 2019-02-12 MED ORDER — BISACODYL 10 MG RE SUPP: 10.0000 mg | Freq: Every day | RECTAL | Status: DC | PRN

## 2019-02-12 MED ORDER — ONDANSETRON HCL 4 MG/2ML IJ SOLN
INTRAMUSCULAR | Status: DC | PRN
  Administered 2019-02-12: 4 mg via INTRAVENOUS

## 2019-02-12 MED ORDER — MIDAZOLAM HCL 2 MG/2ML IJ SOLN
INTRAMUSCULAR | Status: AC
  Filled 2019-02-12: qty 2

## 2019-02-12 MED ORDER — CLINDAMYCIN PHOSPHATE 600 MG/50ML IV SOLN
600.0000 mg | Freq: Four times a day (QID) | INTRAVENOUS | Status: AC
  Administered 2019-02-12 – 2019-02-13 (×2): 600 mg via INTRAVENOUS
  Filled 2019-02-12 (×2): qty 50

## 2019-02-12 MED ORDER — ZOLPIDEM TARTRATE 5 MG PO TABS: 5.0000 mg | ORAL_TABLET | Freq: Every evening | ORAL | Status: DC | PRN

## 2019-02-12 MED ORDER — PHENYLEPHRINE 40 MCG/ML (10ML) SYRINGE FOR IV PUSH (FOR BLOOD PRESSURE SUPPORT)
100.0000 ug | PREFILLED_SYRINGE | INTRAVENOUS | Status: DC | PRN
  Administered 2019-02-12: 100 ug via INTRAVENOUS

## 2019-02-12 MED ORDER — FENTANYL CITRATE (PF) 100 MCG/2ML IJ SOLN
INTRAMUSCULAR | Status: DC | PRN
  Administered 2019-02-12: 50 ug via INTRAVENOUS

## 2019-02-12 MED ORDER — FLUMAZENIL 0.5 MG/5ML IV SOLN: 0.2000 mg | Freq: Once | INTRAVENOUS | Status: AC

## 2019-02-12 MED ORDER — WARFARIN SODIUM 6 MG PO TABS
6.0000 mg | ORAL_TABLET | Freq: Once | ORAL | Status: AC
  Administered 2019-02-12: 19:00:00 6 mg via ORAL
  Filled 2019-02-12: qty 1

## 2019-02-12 MED ORDER — FENTANYL CITRATE (PF) 100 MCG/2ML IJ SOLN: 25.0000 ug | INTRAMUSCULAR | Status: DC | PRN

## 2019-02-12 MED ORDER — NEOMYCIN-POLYMYXIN B GU 40-200000 IR SOLN
Status: DC | PRN
  Administered 2019-02-12: 4 mL

## 2019-02-12 MED ORDER — ALUM & MAG HYDROXIDE-SIMETH 200-200-20 MG/5ML PO SUSP: 30.0000 mL | ORAL | Status: DC | PRN

## 2019-02-12 MED ORDER — DIPHENHYDRAMINE HCL 12.5 MG/5ML PO ELIX: 12.5000 mg | ORAL_SOLUTION | ORAL | Status: DC | PRN

## 2019-02-12 MED ORDER — PHENYLEPHRINE HCL (PRESSORS) 10 MG/ML IV SOLN
INTRAVENOUS | Status: DC | PRN
  Administered 2019-02-12 (×2): 100 ug via INTRAVENOUS
  Administered 2019-02-12: 200 ug via INTRAVENOUS
  Administered 2019-02-12: 100 ug via INTRAVENOUS

## 2019-02-12 MED ORDER — METOCLOPRAMIDE HCL 10 MG PO TABS
5.0000 mg | ORAL_TABLET | Freq: Three times a day (TID) | ORAL | Status: DC | PRN
  Filled 2019-02-12: qty 1

## 2019-02-12 MED ORDER — BUPIVACAINE HCL (PF) 0.5 % IJ SOLN
INTRAMUSCULAR | Status: DC | PRN
  Administered 2019-02-12: 3 mL

## 2019-02-12 MED ORDER — FENTANYL CITRATE (PF) 100 MCG/2ML IJ SOLN
INTRAMUSCULAR | Status: AC
  Filled 2019-02-12: qty 2

## 2019-02-12 SURGICAL SUPPLY — 62 items
BLADE SAGITTAL AGGR TOOTH XLG (BLADE) ×3 IMPLANT
BNDG COHESIVE 6X5 TAN STRL LF (GAUZE/BANDAGES/DRESSINGS) ×9 IMPLANT
CANISTER SUCT 1200ML W/VALVE (MISCELLANEOUS) ×3 IMPLANT
CANISTER WOUND CARE 500ML ATS (WOUND CARE) ×3 IMPLANT
CEMENT HV SMART SET (Cement) ×6 IMPLANT
CEMENT RESTRICTOR DEPUY SZ 1 (Cement) ×3 IMPLANT
CHLORAPREP W/TINT 26 (MISCELLANEOUS) ×3 IMPLANT
COVER BACK TABLE REUSABLE LG (DRAPES) ×3 IMPLANT
COVER WAND RF STERILE (DRAPES) ×3 IMPLANT
DRAPE 3/4 80X56 (DRAPES) ×9 IMPLANT
DRAPE C-ARM XRAY 36X54 (DRAPES) ×3 IMPLANT
DRAPE INCISE IOBAN 66X60 STRL (DRAPES) IMPLANT
DRAPE POUCH INSTRU U-SHP 10X18 (DRAPES) ×3 IMPLANT
DRESSING SURGICEL FIBRLLR 1X2 (HEMOSTASIS) ×2 IMPLANT
DRSG OPSITE POSTOP 4X8 (GAUZE/BANDAGES/DRESSINGS) ×6 IMPLANT
DRSG SURGICEL FIBRILLAR 1X2 (HEMOSTASIS) ×6
ELECT BLADE 6.5 EXT (BLADE) ×3 IMPLANT
ELECT REM PT RETURN 9FT ADLT (ELECTROSURGICAL) ×3
ELECTRODE REM PT RTRN 9FT ADLT (ELECTROSURGICAL) ×1 IMPLANT
GLOVE BIOGEL PI IND STRL 9 (GLOVE) ×1 IMPLANT
GLOVE BIOGEL PI INDICATOR 9 (GLOVE) ×2
GLOVE SURG SYN 9.0  PF PI (GLOVE) ×4
GLOVE SURG SYN 9.0 PF PI (GLOVE) ×2 IMPLANT
GOWN SRG 2XL LVL 4 RGLN SLV (GOWNS) ×1 IMPLANT
GOWN STRL NON-REIN 2XL LVL4 (GOWNS) ×2
GOWN STRL REUS W/ TWL LRG LVL3 (GOWN DISPOSABLE) ×1 IMPLANT
GOWN STRL REUS W/TWL LRG LVL3 (GOWN DISPOSABLE) ×2
HEAD BIPOLAR 48MM (Head) ×3 IMPLANT
HEMOVAC 400CC 10FR (MISCELLANEOUS) IMPLANT
HIP FEM HD M 28 (Head) ×3 IMPLANT
HIP STEM FEM 2 STD (Stem) ×3 IMPLANT
HOLDER FOLEY CATH W/STRAP (MISCELLANEOUS) ×3 IMPLANT
HOOD PEEL AWAY FLYTE STAYCOOL (MISCELLANEOUS) ×3 IMPLANT
KIT PREVENA INCISION MGT 13 (CANNISTER) IMPLANT
MAT ABSORB  FLUID 56X50 GRAY (MISCELLANEOUS) ×2
MAT ABSORB FLUID 56X50 GRAY (MISCELLANEOUS) ×1 IMPLANT
NDL SAFETY ECLIPSE 18X1.5 (NEEDLE) ×1 IMPLANT
NEEDLE HYPO 18GX1.5 SHARP (NEEDLE) ×2
NEEDLE SPNL 20GX3.5 QUINCKE YW (NEEDLE) ×6 IMPLANT
NS IRRIG 1000ML POUR BTL (IV SOLUTION) ×3 IMPLANT
PACK HIP COMPR (MISCELLANEOUS) ×3 IMPLANT
PENCIL SMOKE EVACUATOR COATED (MISCELLANEOUS) ×3 IMPLANT
SCALPEL PROTECTED #10 DISP (BLADE) ×6 IMPLANT
SOL PREP PVP 2OZ (MISCELLANEOUS) ×3
SOLUTION PREP PVP 2OZ (MISCELLANEOUS) ×1 IMPLANT
SPONGE DRAIN TRACH 4X4 STRL 2S (GAUZE/BANDAGES/DRESSINGS) ×3 IMPLANT
STAPLER SKIN PROX 35W (STAPLE) ×3 IMPLANT
STRAP SAFETY 5IN WIDE (MISCELLANEOUS) ×3 IMPLANT
SUT DVC 2 QUILL PDO  T11 36X36 (SUTURE) ×2
SUT DVC 2 QUILL PDO T11 36X36 (SUTURE) ×1 IMPLANT
SUT SILK 0 (SUTURE) ×2
SUT SILK 0 30XBRD TIE 6 (SUTURE) ×1 IMPLANT
SUT V-LOC 90 ABS DVC 3-0 CL (SUTURE) ×3 IMPLANT
SUT VIC AB 1 CT1 36 (SUTURE) ×3 IMPLANT
SYR 20ML LL LF (SYRINGE) ×3 IMPLANT
SYR 30ML LL (SYRINGE) ×3 IMPLANT
SYR 50ML LL SCALE MARK (SYRINGE) ×6 IMPLANT
SYR BULB IRRIG 60ML STRL (SYRINGE) ×3 IMPLANT
TAPE MICROFOAM 4IN (TAPE) ×3 IMPLANT
TOWEL OR 17X26 4PK STRL BLUE (TOWEL DISPOSABLE) ×3 IMPLANT
TOWER CARTRIDGE SMART MIX (DISPOSABLE) ×3 IMPLANT
TRAY FOLEY MTR SLVR 16FR STAT (SET/KITS/TRAYS/PACK) ×3 IMPLANT

## 2019-02-12 NOTE — Progress Notes (Signed)
INR 1.3.  Site marked.  Plan left hip hemiarthroplasty this am.

## 2019-02-12 NOTE — Progress Notes (Signed)
PROGRESS NOTE    Elizabeth Fields  OMV:[id number] DOB: [birth year] DOA: 02/09/2019 PCP: Baxter Hire, MD    Brief Narrative:  86 y.o.femalewith medical history ofA-Fibon warfarin, HTN, HLD, renal insufficiency, hypothyroidism, aortic and mitral valve disease, presented to the ED on 2/1 withleft hip pain after amechanicalfallat home after which she wasnot able to move that hip or put weight on itthe left leg.In the ED, vitals stable, labs unremarkable except INR 2.5. Left hip x-ray showed a displaced femoral neck fracture.     Consultants:   Orthopedics  Procedures: None  Antimicrobials:   None   Subjective: Saw pt early this morning prior to going to surgery and postoperatively.  Postoperatively patient was hypotensive in the PACU was given IV fluids with improvement of her blood pressure.  Had a good urine output initially but has decreased.  This a.m. patient had no complaints.  Postoperatively husband at bedside.  She denies any pain, shortness of breath, chest pain or any other symptoms currently.  Objective: Vitals:   02/12/19 1648 02/12/19 1703 02/12/19 1743 02/12/19 1845  BP: (!) 97/55 (!) 104/49 (!) 104/59 (!) 106/59  Pulse: 93 90 93 (!) 107  Resp: 15 15 16 17   Temp:  99 F (37.2 C) 97.6 F (36.4 C) 98.7 F (37.1 C)  TempSrc:   Oral Oral  SpO2: 98% 100% 97% 93%  Weight:      Height:        Intake/Output Summary (Last 24 hours) at 02/12/2019 1932 Last data filed at 02/12/2019 1800 Gross per 24 hour  Intake 2938.67 ml  Output 460 ml  Net 2478.67 ml   Filed Weights   02/10/19 1449 02/11/19 0500 02/12/19 0840  Weight: 46.7 kg 51.2 kg 51.2 kg    Examination:  General exam: Appears calm and comfortable, NAD husband at bedside Respiratory system: Clear to auscultation. Respiratory effort normal. Cardiovascular system: S1 & S2 heard, RRR. No JVD, murmurs, rubs, gallops or clicks. No pedal edema. Gastrointestinal system: Abdomen is nondistended,  soft and nontender. No organomegaly or masses felt. Normal bowel sounds heard. GG:EZMOQ in place with minimal dark urine Central nervous system: Alert and oriented. No focal neurological deficits. Extremities: no edema or cyanosis Skin: warm /dry Psychiatry: Judgement and insight appear normal. Mood & affect appropriate.     Data Reviewed: I have personally reviewed following labs and imaging studies  CBC: Recent Labs  Lab 02/09/19 1653 02/10/19 0348 02/11/19 0634 02/12/19 1630  WBC 10.8* 10.0 10.2 10.3  NEUTROABS 9.1*  --   --   --   HGB 14.0 13.4 13.2 12.5  HCT 42.3 40.8 38.9 37.8  MCV 94.8 94.7 93.3 94.7  PLT 267 225 208 947   Basic Metabolic Panel: Recent Labs  Lab 02/09/19 1653 02/10/19 0348 02/11/19 0634 02/12/19 0323 02/12/19 1630  NA 138 138 140 137 137  K 4.3 3.9 3.7 3.8 4.3  CL 96* 98 100 101 104  CO2 27 27 27 25  18*  GLUCOSE 132* 122* 111* 106* 100*  BUN 26* 26* 38* 29* 26*  CREATININE 1.28* 1.22* 1.67* 1.39* 1.11*  CALCIUM 9.6 9.1 9.3 8.8* 8.3*  MG  --  2.5* 2.5*  --   --    GFR: Estimated Creatinine Clearance: 29.3 mL/min (A) (by C-G formula based on SCr of 1.11 mg/dL (H)). Liver Function Tests: No results for input(s): AST, ALT, ALKPHOS, BILITOT, PROT, ALBUMIN in the last 168 hours. No results for input(s): LIPASE, AMYLASE in the last 168  hours. No results for input(s): AMMONIA in the last 168 hours. Coagulation Profile: Recent Labs  Lab 02/09/19 1653 02/10/19 0348 02/11/19 0634 02/12/19 0323  INR 2.5* 2.8* 2.2* 1.3*   Cardiac Enzymes: No results for input(s): CKTOTAL, CKMB, CKMBINDEX, TROPONINI in the last 168 hours. BNP (last 3 results) No results for input(s): PROBNP in the last 8760 hours. HbA1C: No results for input(s): HGBA1C in the last 72 hours. CBG: No results for input(s): GLUCAP in the last 168 hours. Lipid Profile: No results for input(s): CHOL, HDL, LDLCALC, TRIG, CHOLHDL, LDLDIRECT in the last 72 hours. Thyroid Function  Tests: No results for input(s): TSH, T4TOTAL, FREET4, T3FREE, THYROIDAB in the last 72 hours. Anemia Panel: No results for input(s): VITAMINB12, FOLATE, FERRITIN, TIBC, IRON, RETICCTPCT in the last 72 hours. Sepsis Labs: No results for input(s): PROCALCITON, LATICACIDVEN in the last 168 hours.  Recent Results (from the past 240 hour(s))  Respiratory Panel by RT PCR (Flu A&B, Covid) - Nasopharyngeal Swab     Status: None   Collection Time: 02/09/19  5:59 PM   Specimen: Nasopharyngeal Swab  Result Value Ref Range Status   SARS Coronavirus 2 by RT PCR NEGATIVE NEGATIVE Final    Comment: (NOTE) SARS-CoV-2 target nucleic acids are NOT DETECTED. The SARS-CoV-2 RNA is generally detectable in upper respiratoy specimens during the acute phase of infection. The lowest concentration of SARS-CoV-2 viral copies this assay can detect is 131 copies/mL. A negative result does not preclude SARS-Cov-2 infection and should not be used as the sole basis for treatment or other patient management decisions. A negative result may occur with  improper specimen collection/handling, submission of specimen other than nasopharyngeal swab, presence of viral mutation(s) within the areas targeted by this assay, and inadequate number of viral copies (<131 copies/mL). A negative result must be combined with clinical observations, patient history, and epidemiological information. The expected result is Negative. Fact Sheet for Patients:  PinkCheek.be Fact Sheet for Healthcare Providers:  GravelBags.it This test is not yet ap proved or cleared by the [place] FDA and  has been authorized for detection and/or diagnosis of SARS-CoV-2 by FDA under an Emergency Use Authorization (EUA). This EUA will remain  in effect (meaning this test can be used) for the duration of the COVID-19 declaration under Section 564(b)(1) of the Act, 21 U.S.C. section  360bbb-3(b)(1), unless the authorization is terminated or revoked sooner.    Influenza A by PCR NEGATIVE NEGATIVE Final   Influenza B by PCR NEGATIVE NEGATIVE Final    Comment: (NOTE) The Xpert Xpress SARS-CoV-2/FLU/RSV assay is intended as an aid in  the diagnosis of influenza from Nasopharyngeal swab specimens and  should not be used as a sole basis for treatment. Nasal washings and  aspirates are unacceptable for Xpert Xpress SARS-CoV-2/FLU/RSV  testing. Fact Sheet for Patients: PinkCheek.be Fact Sheet for Healthcare Providers: GravelBags.it This test is not yet approved or cleared by the [place] FDA and  has been authorized for detection and/or diagnosis of SARS-CoV-2 by  FDA under an Emergency Use Authorization (EUA). This EUA will remain  in effect (meaning this test can be used) for the duration of the  Covid-19 declaration under Section 564(b)(1) of the Act, 21  U.S.C. section 360bbb-3(b)(1), unless the authorization is  terminated or revoked. Performed at [facility], [address], [place], [place] [address]   Surgical PCR screen     Status: None   Collection Time: 02/12/19  3:01 AM   Specimen: Nasal Mucosa;  Nasal Swab  Result Value Ref Range Status   MRSA, PCR NEGATIVE NEGATIVE Final   Staphylococcus aureus NEGATIVE NEGATIVE Final    Comment: (NOTE) The Xpert SA Assay (FDA approved for NASAL specimens in patients 25 years of age and older), is one component of a comprehensive surveillance program. It is not intended to diagnose infection nor to guide or monitor treatment. Performed at [facility], [place] [place],  [address]          Radiology Studies: DG HIP OPERATIVE UNILAT W OR W/O PELVIS LEFT  Result Date: 02/12/2019 CLINICAL DATA:  Left hip hemiarthroplasty EXAM: OPERATIVE LEFT HIP (WITH PELVIS IF PERFORMED) AP VIEWS TECHNIQUE: Fluoroscopic spot image(s)  were submitted for interpretation post-operatively. COMPARISON:  02/09/2019 FINDINGS: A single C-arm fluoroscopic image was obtained intraoperatively and submitted for post operative interpretation. Partially visualized left hip hemiarthroplasty hardware, grossly intact. 12 seconds of fluoroscopy time was utilized. Please see the performing provider's procedural report for further detail. IMPRESSION: As above. Electronically Signed   By: Davina Poke D.O.   On: 02/12/2019 12:18   DG HIP UNILAT W OR W/O PELVIS 2-3 VIEWS LEFT  Result Date: 02/12/2019 CLINICAL DATA:  Left hip hemiarthroplasty EXAM: DG HIP (WITH OR WITHOUT PELVIS) 2-3V LEFT COMPARISON:  02/09/2019 FINDINGS: Interval postsurgical changes from left hip hemiarthroplasty. Arthroplasty components appear in their expected alignment without evidence of immediate postoperative complication. Bones appear demineralized. Expected postoperative changes within the overlying soft tissues. Prominent vascular calcifications. IMPRESSION: Interval left hip hemiarthroplasty without evidence of immediate postoperative complication. Electronically Signed   By: Davina Poke D.O.   On: 02/12/2019 12:42        Scheduled Meds: . Chlorhexidine Gluconate Cloth  6 each Topical Daily  . docusate sodium  100 mg Oral BID  . doxepin  10 mg Oral Daily  . [START ON 02/13/2019] enoxaparin (LOVENOX) injection  50 mg Subcutaneous Q24H  . feeding supplement (ENSURE ENLIVE)  237 mL Oral BID BM  . levothyroxine  50 mcg Oral QAC breakfast  . magnesium oxide  400 mg Oral Daily  . metoprolol succinate  100 mg Oral Daily  . senna  1 tablet Oral BID  . simvastatin  20 mg Oral Daily  . Warfarin - Pharmacist Dosing Inpatient   Does not apply q1800   Continuous Infusions: . sodium chloride 100 mL/hr at 02/12/19 1759  . clindamycin (CLEOCIN) IV      Assessment & Plan:   Principal Problem:   Hip fracture, left (HCC) Active Problems:   Atrial fibrillation (HCC)    Arteriosclerosis of coronary artery   Chronic kidney disease (CKD), stage III (moderate)   HLD (hyperlipidemia)   Osteoporosis, post-menopausal   Hypothyroidism   Protein-calorie malnutrition, severe   Hip fracture, left Displaced left femoral neck fracture secondary to mechanical fall. --Ortho following-s/p left hemihip arthroplasty 02/12/19 --surgery was initially held until INR comes down --> Ortho gave IV vit K already. INR1.3 today.  --pain control - Tylenol, low dose IV Toradol (stable CKD, monitor renal function), oxycodone, morphine  --bowel regimen --PT eval post-op-when cleared by ortho. Postop: pt was hypotensive, given IVF boluses, urine outpt was good earlier then less . Bladder scan negative. Now on ivf maintenance. Monitor I/o.  Atrial fibrillation- rate controlled. On warfarin. --a/c on hold for surgery --resume coumadin when deemed safe postop by ortho --continue Toprol  Coronary arterydisease- stable, no chest pain Valvular Heart Disease- stable --no need for telemetry unless a-fib rate uncontrolled --continue spironolactone.Marland Kitchen>  hold since with a/cki  HLD (hyperlipidemia) --continue simvastatin  Acute on Chronic kidney disease (CKD), stage III (moderate)- Stable. --creatinine increased.  -Hold Aldactone for now Start gentle hydration Monitor volume status and renal function  Osteoporosis, post-menopausal- with multiple fragility fractures including current left hip, prior right hip and multiple vertebral compression fractures. Does not ppear to be on home medication for this. --recommend initiation of therapy ASAP, follow up with PCP --Continue her home vitamin D vitamin D  Hypothyroidism - continue Synthroid  GERD - continue Protonix   Other - continue Doxepin   DVT prophylaxis:Lovenox Code Status: Full Code Family Communication: None at bedside  Disposition Plan:Pending surgery and PT evaluation. Expect SNF will be needed.        LOS: 3 days   Time spent:45 min with >50% on coc    Nolberto Hanlon, MD [facility] Pager [phone]  If 7PM-7AM, please contact night-coverage [email or web address] Password [facility] 02/12/2019, 7:32 PM

## 2019-02-12 NOTE — Anesthesia Procedure Notes (Signed)
Spinal  Patient location during procedure: OR Staffing Performed: anesthesiologist and resident/CRNA  Anesthesiologist: Molli Barrows, MD Resident/CRNA: Fredderick Phenix, CRNA Preanesthetic Checklist Completed: patient identified, IV checked, site marked, risks and benefits discussed, surgical consent, monitors and equipment checked, pre-op evaluation and timeout performed Spinal Block Patient position: sitting Prep: DuraPrep Patient monitoring: heart rate, cardiac monitor, continuous pulse ox and blood pressure Approach: midline Location: L3-4 Injection technique: single-shot Needle Needle type: Sprotte and Quincke  Needle gauge: 22 G Needle length: 9 cm Assessment Sensory level: T4 Additional Notes One attempt by CRNA, two attempts by Anesthesiologist

## 2019-02-12 NOTE — Transfer of Care (Signed)
Immediate Anesthesia Transfer of Care Note  Patient: Elizabeth Fields  Procedure(s) Performed: ANTERIOR APPROACH HEMI HIP ARTHROPLASTY (Left )  Patient Location: PACU  Anesthesia Type:General  Level of Consciousness: sedated  Airway & Oxygen Therapy: Patient Spontanous Breathing and Patient connected to nasal cannula oxygen  Post-op Assessment: Report given to RN and Post -op Vital signs reviewed and stable  Post vital signs: Reviewed and stable  Last Vitals:  Vitals Value Taken Time  BP 115/94 02/12/19 1211  Temp 36.2 C 02/12/19 1203  Pulse 97 02/12/19 1212  Resp 18 02/12/19 1212  SpO2 96 % 02/12/19 1212  Vitals shown include unvalidated device data.  Last Pain:  Vitals:   02/12/19 1203  TempSrc:   PainSc: Asleep      Patients Stated Pain Goal: 0 (92/92/44 [id number])  Complications: No apparent anesthesia complications

## 2019-02-12 NOTE — Anesthesia Preprocedure Evaluation (Signed)
Anesthesia Evaluation  Patient identified by MRN, date of birth, ID band Patient awake    Reviewed: Allergy & Precautions, NPO status , Patient's Chart, lab work & pertinent test results  History of Anesthesia Complications Negative for: history of anesthetic complications  Airway Mallampati: III       Dental   Pulmonary neg COPD, Not current smoker,           Cardiovascular hypertension, Pt. on medications +CHF  + dysrhythmias Atrial Fibrillation      Neuro/Psych neg Seizures    GI/Hepatic Neg liver ROS, neg GERD  ,  Endo/Other  neg diabetesHypothyroidism   Renal/GU Renal InsufficiencyRenal disease     Musculoskeletal   Abdominal   Peds  Hematology   Anesthesia Other Findings   Reproductive/Obstetrics                             Anesthesia Physical Anesthesia Plan  ASA: III  Anesthesia Plan: Spinal   Post-op Pain Management:    Induction:   PONV Risk Score and Plan:   Airway Management Planned:   Additional Equipment:   Intra-op Plan:   Post-operative Plan:   Informed Consent: I have reviewed the patients History and Physical, chart, labs and discussed the procedure including the risks, benefits and alternatives for the proposed anesthesia with the patient or authorized representative who has indicated his/her understanding and acceptance.       Plan Discussed with:   Anesthesia Plan Comments:         Anesthesia Quick Evaluation

## 2019-02-12 NOTE — Consult Note (Signed)
ANTICOAGULATION CONSULT NOTE - Follow Up Consult  Pharmacy Consult for Warfarin/Lovenox Indication: atrial fibrillation/stroke prevention  Allergies  Allergen Reactions  . Fosamax [Alendronate Sodium] Nausea And Vomiting  . Other Rash  . Penicillins Nausea And Vomiting and Palpitations  . Tramadol Nausea And Vomiting and Palpitations    Patient Measurements: Height: 5\' 2"  (157.5 cm) Weight: 112 lb 14 oz (51.2 kg) IBW/kg (Calculated) : 50.1  Vital Signs: Temp: 97.6 F (36.4 C) (02/04 1743) Temp Source: Oral (02/04 1743) BP: 104/59 (02/04 1743) Pulse Rate: 93 (02/04 1743)  Labs: Recent Labs    02/10/19 0348 02/10/19 0348 02/11/19 0634 02/12/19 0323 02/12/19 1630  HGB 13.4   < > 13.2  --  12.5  HCT 40.8  --  38.9  --  37.8  PLT 225  --  208  --  172  LABPROT 29.1*  --  24.5* 16.3*  --   INR 2.8*  --  2.2* 1.3*  --   CREATININE 1.22*   < > 1.67* 1.39* 1.11*   < > = values in this interval not displayed.    Estimated Creatinine Clearance: 29.3 mL/min (A) (by C-G formula based on SCr of 1.11 mg/dL (H)).  Medications:  Pt home dose of Warfarin is 4mg  daily.  Date Time INR Response  02/01  1653  2.5 ---- 02/02 0348 2.8  Vit K 1mg  @ 1439 02/03  0634  2.2 Vit K 5mg  @ 1749 02/04  0323 1.3 Surgery  Assessment: Pharmacy has been consulted to initiate and monitor Warfarin therapy for a fib.  Will likely take a few days to achieve therapeutic INR, even with aggressive Warfarin dosing.    Pt will likely need enoxaparin bridge.  Pharmacy has reached out to provider.  CrCl = 29.3 ml/min  Goal of Therapy:  INR 2-3 Monitor platelets by anticoagulation protocol: Yes   Plan:  Will give Warfarin 6mg  (50% greater than home dose), and recheck INR tomorrow am.   Spoke with Dr Rudene Christians regarding lovenox bridge.   Dr Rudene Christians would like to give smaller dose of 40 mg SQ X 1 on 2/4 PM and then begin lovenox 50 mg SQ Q24H on 2/5 @ 0800.   Orene Desanctis, PharmD Clinical  Pharmacist 02/12/2019 6:16 PM

## 2019-02-12 NOTE — Op Note (Signed)
02/12/2019  2:07 PM  PATIENT:  Catalina Pizza  86 y.o. female  PRE-OPERATIVE DIAGNOSIS:  Left Hip Fracture  POST-OPERATIVE DIAGNOSIS:  Left Hip Fracture  PROCEDURE:  Procedure(s): ANTERIOR APPROACH HEMI HIP ARTHROPLASTY (Left)  SURGEON: Laurene Footman, MD  ASSISTANTS: none  ANESTHESIA:   spinal  EBL:  Total I/O In: 1000 [I.V.:1000] Out: 400 [Urine:300; Blood:100]  BLOOD ADMINISTERED:none  DRAINS: none   LOCAL MEDICATIONS USED:  MARCAINE    and OTHER Exparel  SPECIMEN:  Source of Specimen:  left femoral head  DISPOSITION OF SPECIMEN:  PATHOLOGY  COUNTS:  YES  TOURNIQUET:  * No tourniquets in log *  IMPLANTS: Medacta AMIS 2 standard cemented stem, 48 mm bipolar component , M 28 mm haed  DICTATION: .Dragon Dictation   The patient was brought to the operating room and after spinal anesthesia was obtained patient was placed on the operative table with the ipsilateral foot into the Medacta attachment, contralateral leg on a well-padded table. C-arm was brought in and preop template x-ray taken. After prepping and draping in usual sterile fashion appropriate patient identification and timeout procedures were completed. Anterior approach to the hip was obtained and centered over the greater trochanter and TFL muscle. The subcutaneous tissue was incised hemostasis being achieved by electrocautery. TFL fascia was incised and the muscle retracted laterally deep retractor placed. The lateral femoral circumflex vessels were identified and ligated. The anterior capsule was exposed and a capsulotomy performed. The neck was identified and a femoral neck cut carried out with a saw. The head was removed without difficulty and showed no significant t DJD.  Head measured 48 mm and 48 mm head trial was appropriate size The leg was then externally rotated and ischiofemoral and pubofemoral releases carried out. The femur was sequentially broached to a size 3.  Cement mixed after cement restrictor placed  and canal dried.   The 2 standard cemented  stem was inserted along with a  M48 28 mm head and 48  mm bipolar head placed. The hip was reduced and was stable the wound was thoroughly irrigated with fibrillar placed along the posterior capsule and medial neck. The deep fascia ws closed using a heavy Quill after infiltration of 30 cc of quarter percent Sensorcaine with epinephrine. along with Exparel throughout the case.  3-0 V-loc to close the skin with skin staples. Xeroform 4 x 4's ABDs and tape patient was sent to recovery in stable condition.   PLAN OF CARE: continue as inpatient

## 2019-02-12 NOTE — Plan of Care (Signed)
  Problem: Education: Goal: Knowledge of General Education information will improve Description: Including pain rating scale, medication(s)/side effects and non-pharmacologic comfort measures Outcome: Progressing   Problem: Clinical Measurements: Goal: Ability to maintain clinical measurements within normal limits will improve Outcome: Progressing Goal: Respiratory complications will improve Outcome: Progressing Goal: Cardiovascular complication will be avoided Outcome: Progressing   Problem: Activity: Goal: Risk for activity intolerance will decrease Outcome: Progressing   Problem: Pain Managment: Goal: General experience of comfort will improve Outcome: Progressing

## 2019-02-12 NOTE — Progress Notes (Signed)
Anticoagulation monitoring(Lovenox):  86 yo female ordered Lovenox 40 mg Q24h  Filed Weights   02/10/19 1449 02/11/19 0500 02/12/19 0840  Weight: 103 lb (46.7 kg) 112 lb 14 oz (51.2 kg) 112 lb 14 oz (51.2 kg)   BMI    Lab Results  Component Value Date   CREATININE 1.11 (H) 02/12/2019   CREATININE 1.39 (H) 02/12/2019   CREATININE 1.67 (H) 02/11/2019   Estimated Creatinine Clearance: 29.3 mL/min (A) (by C-G formula based on SCr of 1.11 mg/dL (H)). Hemoglobin & Hematocrit     Component Value Date/Time   HGB 12.5 02/12/2019 1630   HCT 37.8 02/12/2019 1630     Per Protocol for Patient with estCrcl < 30 ml/min and BMI < 40, will transition to Lovenox 30 mg Q24h.

## 2019-02-12 NOTE — OR Nursing (Signed)
Dr. Bertell Maria notified of the following: BP 80/53 after phenylephrine, Decreased SA02 89% with decreased BP, Urine output of 40 ml over 2 hours. Phenylephrine given by Dr. Bertell Maria.  Fluid bolus ordered.. Will observe until spinal level comes down further

## 2019-02-12 NOTE — Consult Note (Addendum)
ANTICOAGULATION CONSULT NOTE - Follow Up Consult  Pharmacy Consult for Warfarin Indication: atrial fibrillation/stroke prevention  Allergies  Allergen Reactions  . Fosamax [Alendronate Sodium] Nausea And Vomiting  . Other Rash  . Penicillins Nausea And Vomiting and Palpitations  . Tramadol Nausea And Vomiting and Palpitations    Patient Measurements: Height: 5\' 2"  (157.5 cm) Weight: 112 lb 14 oz (51.2 kg) IBW/kg (Calculated) : 50.1  Vital Signs: Temp: 97.1 F (36.2 C) (02/04 1203) Temp Source: Oral (02/04 0840) BP: 99/68 (02/04 1248) Pulse Rate: 94 (02/04 1248)  Labs: Recent Labs    02/09/19 1653 02/09/19 1653 02/10/19 0348 02/11/19 0634 02/12/19 0323  HGB 14.0   < > 13.4 13.2  --   HCT 42.3  --  40.8 38.9  --   PLT 267  --  225 208  --   APTT 35  --   --   --   --   LABPROT 27.2*   < > 29.1* 24.5* 16.3*  INR 2.5*   < > 2.8* 2.2* 1.3*  CREATININE 1.28*   < > 1.22* 1.67* 1.39*   < > = values in this interval not displayed.    Estimated Creatinine Clearance: 23.4 mL/min (A) (by C-G formula based on SCr of 1.39 mg/dL (H)).  Medications:  Pt home dose of Warfarin is 4mg  daily.  Date Time INR Response  02/01  1653  2.5 ---- 02/02 0348 2.8  Vit K 1mg  @ 1439 02/03  0634  2.2 Vit K 5mg  @ 1749 02/04  0323 1.3 Surgery  Assessment: Pharmacy has been consulted to initiate and monitor Warfarin therapy for a fib.  Will likely take a few days to achieve therapeutic INR, even with aggressive Warfarin dosing.    Pt will likely need enoxaparin bridge.  Pharmacy has reached out to provider.  Goal of Therapy:  INR 2-3 Monitor platelets by anticoagulation protocol: Yes   Plan:  Will give Warfarin 6mg  (50% greater than home dose), and recheck INR tomorrow am.   Lu Duffel, PharmD, BCPS Clinical Pharmacist 02/12/2019 1:03 PM

## 2019-02-13 DIAGNOSIS — W19XXXS Unspecified fall, sequela: Secondary | ICD-10-CM

## 2019-02-13 LAB — PROTIME-INR
INR: 1.3 — ABNORMAL HIGH (ref 0.8–1.2)
Prothrombin Time: 16.5 seconds — ABNORMAL HIGH (ref 11.4–15.2)

## 2019-02-13 LAB — CBC
HCT: 33.1 % — ABNORMAL LOW (ref 36.0–46.0)
Hemoglobin: 10.8 g/dL — ABNORMAL LOW (ref 12.0–15.0)
MCH: 31.9 pg (ref 26.0–34.0)
MCHC: 32.6 g/dL (ref 30.0–36.0)
MCV: 97.6 fL (ref 80.0–100.0)
Platelets: 169 10*3/uL (ref 150–400)
RBC: 3.39 MIL/uL — ABNORMAL LOW (ref 3.87–5.11)
RDW: 15.1 % (ref 11.5–15.5)
WBC: 7.7 10*3/uL (ref 4.0–10.5)
nRBC: 0 % (ref 0.0–0.2)

## 2019-02-13 LAB — BASIC METABOLIC PANEL
Anion gap: 9 (ref 5–15)
BUN: 29 mg/dL — ABNORMAL HIGH (ref 8–23)
CO2: 24 mmol/L (ref 22–32)
Calcium: 7.7 mg/dL — ABNORMAL LOW (ref 8.9–10.3)
Chloride: 107 mmol/L (ref 98–111)
Creatinine, Ser: 1.22 mg/dL — ABNORMAL HIGH (ref 0.44–1.00)
GFR calc Af Amer: 47 mL/min — ABNORMAL LOW (ref >60)
GFR calc non Af Amer: 40 mL/min — ABNORMAL LOW (ref >60)
Glucose, Bld: 113 mg/dL — ABNORMAL HIGH (ref 70–99)
Potassium: 4.5 mmol/L (ref 3.5–5.1)
Sodium: 140 mmol/L (ref 135–145)

## 2019-02-13 MED ORDER — LACTATED RINGERS IV SOLN: INTRAVENOUS | Status: DC

## 2019-02-13 MED ORDER — WARFARIN SODIUM 6 MG PO TABS
6.0000 mg | ORAL_TABLET | Freq: Once | ORAL | Status: AC
  Administered 2019-02-13: 6 mg via ORAL
  Filled 2019-02-13: qty 1

## 2019-02-13 NOTE — TOC Progression Note (Signed)
Transition of Care [facility]) - Progression Note    Patient Details  Name: Elizabeth Fields MRN: [id number] Date of Birth: 15-Jan-1931  [facility]) CM/SW Contact  Amia Rynders, Gardiner Rhyme, LCSW Phone Number: 02/13/2019, 11:27 AM  Clinical Narrative: Met with pt who had surgery yesterday and is having loose stools and much pain. She is trying to do PT but is having difficulty today. She wants to go home with husband with home health but husband has health issues and can not provide much physical care due to back issues. Will begin FL2 and passar in anticipation of pt needing SNF when ready for discharge from the hospital. She hopes to do better in the next few days. Will continue to follow for discharge needs.      Expected Discharge Plan: [place] Barriers to Discharge: Continued Medical Work up  Expected Discharge Plan and Services Expected Discharge Plan: [place] In-house Referral: Clinical Social Work     Living arrangements for the past 2 months: Single Family Home                                       Social Determinants of Health (SDOH) Interventions    Readmission Risk Interventions No flowsheet data found.

## 2019-02-13 NOTE — Evaluation (Signed)
Occupational Therapy Evaluation Patient Details Name: Elizabeth Fields MRN: [id number] DOB: January 14, 1931 Today's Date: 02/13/2019    History of Present Illness Pt is an 86 y.o. female presenting to hospital 02/09/19 s/p fall with L hip pain; imaging showing displaced L femoral neck fx with resultant varus deformity; surgery delayed d/t elevated INR.  Pt s/p L anterior approach hemi-hip arthroplasty 02/12/19.  PMH includes T12, L1, and L4 compression fx's, a-fib, CHF, htn, CKD, Bergmann syndrome, h/o R hip fx.   Clinical Impression   Pt was seen for OT evaluation and co-tx with PT this date. Prior to hospital admission, pt was independent (supervision for baths). Pt lives with her spouse and has limited additional assist available per spouse. Currently pt demonstrates impairments as described below (See OT problem list) which functionally limit her ability to perform ADL/self-care tasks. Pt currently requires +2 for ADL mobility and Max A for bathing, dressing, and toileting. L hip pain with movement. HR up to 130's sitting EOB so further mobility deferred. Poor sitting balance. Pt would benefit from skilled OT to address noted impairments and functional limitations (see below for any additional details) in order to maximize safety and independence while minimizing falls risk and caregiver burden. Upon hospital discharge, recommend STR to maximize pt safety and return to PLOF.     Follow Up Recommendations  SNF    Equipment Recommendations  3 in 1 bedside commode    Recommendations for Other Services       Precautions / Restrictions Precautions Precautions: Anterior Hip;Fall Precaution Booklet Issued: Yes (comment) Restrictions Weight Bearing Restrictions: Yes LLE Weight Bearing: Weight bearing as tolerated      Mobility Bed Mobility Overal bed mobility: Needs Assistance Bed Mobility: Supine to Sit;Sit to Supine;Rolling Rolling: Mod assist   Supine to sit: Min assist;Mod assist;+2 for  physical assistance Sit to supine: Max assist;+2 for physical assistance   General bed mobility comments: assist for trunk and B LE's semi-supine to/from sitting; vc's for technique; logrolling L and R in bed to donn briefs and doff briefs in bed end of session  Transfers Overall transfer level: Needs assistance               General transfer comment: deferred d/t elevated HR with sitting edge of bed    Balance Overall balance assessment: Needs assistance Sitting-balance support: Single extremity supported;Feet supported Sitting balance-[name]: Poor Sitting balance - Comments: pt requiring at least single UE support for static sitting balance; pt tending to lean forward in sitting requiring cueing for upright posture                                   ADL either performed or assessed with clinical judgement   ADL Overall ADL's : Needs assistance/impaired                                       General ADL Comments: Pt requires Max A for UB and LB ADL tasks, +2 for bed mobility and and ADL transfer attempts     Vision Patient Visual Report: No change from baseline       Perception     Praxis      Pertinent Vitals/Pain Pain Assessment: Faces Pain Score: 4  Faces Pain Scale: Hurts a little bit Pain Location: L hip Pain Descriptors / Indicators:  Sore;Tender;Grimacing;Guarding Pain Intervention(s): Limited activity within patient's tolerance;Monitored during session;Repositioned     Hand Dominance     Extremity/Trunk Assessment Upper Extremity Assessment Upper Extremity Assessment: Generalized weakness   Lower Extremity Assessment Lower Extremity Assessment: Generalized weakness(R LE WFL; difficult to assess L LE d/t pt HOH)   Cervical / Trunk Assessment Cervical / Trunk Assessment: (forward head/shoulders)   Communication Communication Communication: HOH(B hearing aides)   Cognition Arousal/Alertness: Awake/alert Behavior  During Therapy: WFL for tasks assessed/performed                                   General Comments: Pt HOH (even with hearing aides in) and requiring extra time and cueing at times   General Comments       Exercises    Shoulder Instructions      Home Living Family/patient expects to be discharged to:: Private residence Living Arrangements: Spouse/significant other Available Help at Discharge: Family Type of Home: House Home Access: Stairs to enter Technical brewer of Steps: 2-3 Entrance Stairs-Rails: Left Home Layout: One level     Bathroom Shower/Tub: Tub/shower unit(takes sponge baths (occasionally will take baths with supervision of husband))   Bathroom Toilet: Standard     Home Equipment: Walker - 2 wheels;Cane - single point          Prior Functioning/Environment Level of Independence: Independent(except supervision for baths and husband drives)                 OT Problem List: Decreased strength;Decreased range of motion;Pain;Cardiopulmonary status limiting activity;Decreased activity tolerance;Impaired balance (sitting and/or standing);Decreased knowledge of use of DME or AE      OT Treatment/Interventions: Self-care/ADL training;Therapeutic exercise;Therapeutic activities;DME and/or AE instruction;Patient/family education;Balance training    OT Goals(Current goals can be found in the care plan section) Acute Rehab OT Goals Patient Stated Goal: to improve mobility OT Goal Formulation: With patient/family Time For Goal Achievement: 02/27/19 Potential to Achieve Goals: Good ADL Goals Pt Will Perform Upper Body Dressing: sitting;with set-up;with min assist(with no LOB) Pt Will Transfer to Toilet: with max assist;bedside commode;stand pivot transfer Additional ADL Goal #1: Pt will perform bed mobility requiring Min A +1 assist in preparation for EOB ADL tasks.  OT Frequency: Min 1X/week   Barriers to D/C:             Co-evaluation PT/OT/SLP Co-Evaluation/Treatment: Yes Reason for Co-Treatment: To address functional/ADL transfers;For patient/therapist safety PT goals addressed during session: Mobility/safety with mobility;Balance;Proper use of DME;Strengthening/ROM OT goals addressed during session: ADL's and self-care;Proper use of Adaptive equipment and DME;Strengthening/ROM      AM-PAC OT "6 Clicks" Daily Activity     Outcome Measure Help from another person eating meals?: A Little Help from another person taking care of personal grooming?: A Little Help from another person toileting, which includes using toliet, bedpan, or urinal?: Total Help from another person bathing (including washing, rinsing, drying)?: A Lot Help from another person to put on and taking off regular upper body clothing?: A Lot Help from another person to put on and taking off regular lower body clothing?: A Lot 6 Click Score: 13   End of Session    Activity Tolerance: Patient tolerated treatment well;Other (comment)(elevated HR limited mobility) Patient left: in bed;with call bell/phone within reach;with bed alarm set  OT Visit Diagnosis: Other abnormalities of gait and mobility (R26.89);Muscle weakness (generalized) (M62.81);History of falling (Z91.81);Pain Pain - Right/Left: Left Pain -  part of body: Hip                Time: 1350-1414 OT Time Calculation (min): 24 min Charges:  OT General Charges $OT Visit: 1 Visit OT Evaluation $OT Eval Moderate Complexity: 1 Mod  Jeni Salles, MPH, MS, OTR/L ascom [phone] 02/13/19, 5:08 PM

## 2019-02-13 NOTE — Progress Notes (Signed)
PROGRESS NOTE    Elizabeth Fields  YSA:[id number] DOB: Dec 21, 1931 DOA: 01-Mar-2019 PCP: Baxter Hire, MD    Brief Narrative:  86 y.o.femalewith medical history ofA-Fibon warfarin, HTN, HLD, renal insufficiency, hypothyroidism, aortic and mitral valve disease, presented to the ED on 2/1 withleft hip pain after amechanicalfallat home after which she wasnot able to move that hip or put weight on itthe left leg.In the ED, vitals stable, labs unremarkable except INR 2.5. Left hip x-ray showed a displaced femoral neck fracture.     Consultants:   Orthopedics  Procedures: None  Antimicrobials:   None   Subjective: Pt has no complaints this am. Eating breakfast. Pain controlled  Objective: Vitals:   02/13/19 0500 02/13/19 0831 02/13/19 1037 02/13/19 1647  BP:  (!) 98/58 (!) 106/57 126/63  Pulse:  (!) 110 (!) 110 96  Resp:  16  16  Temp:  98 F (36.7 C)  (!) 97.4 F (36.3 C)  TempSrc:  Oral  Oral  SpO2:  (!) 85% 100% 99%  Weight: 56.4 kg     Height:        Intake/Output Summary (Last 24 hours) at 02/13/2019 1649 Last data filed at 02/13/2019 1400 Gross per 24 hour  Intake 2655.57 ml  Output 200 ml  Net 2455.57 ml   Filed Weights   02/11/19 0500 02/12/19 0840 02/13/19 0500  Weight: 51.2 kg 51.2 kg 56.4 kg    Examination:  General exam: Appears calm and comfortable, NAD Respiratory system: Clear to auscultation. Respiratory effort normal.  No wheezing Cardiovascular system: S1 & S2 heard, RRR. No JVD, murmurs, rubs, gallops or clicks. Gastrointestinal system: Abdomen is nondistended, soft and nontender. Normal bowel sounds heard. NA:TFTDD in place  Central nervous system: Alert and oriented. No focal neurological deficits. Extremities: no edema or cyanosis Skin: warm /dry Psychiatry: Judgement and insight appear normal. Mood & affect appropriate.     Data Reviewed: I have personally reviewed following labs and imaging studies  CBC: Recent Labs    Lab March 01, 2019 1653 02/10/19 0348 02/11/19 0634 02/12/19 1630 02/13/19 0612  WBC 10.8* 10.0 10.2 10.3 7.7  NEUTROABS 9.1*  --   --   --   --   HGB 14.0 13.4 13.2 12.5 10.8*  HCT 42.3 40.8 38.9 37.8 33.1*  MCV 94.8 94.7 93.3 94.7 97.6  PLT 267 225 208 172 220   Basic Metabolic Panel: Recent Labs  Lab 02/10/19 0348 02/11/19 0634 02/12/19 0323 02/12/19 1630 02/13/19 0612  NA 138 140 137 137 140  K 3.9 3.7 3.8 4.3 4.5  CL 98 100 101 104 107  CO2 27 27 25  18* 24  GLUCOSE 122* 111* 106* 100* 113*  BUN 26* 38* 29* 26* 29*  CREATININE 1.22* 1.67* 1.39* 1.11* 1.22*  CALCIUM 9.1 9.3 8.8* 8.3* 7.7*  MG 2.5* 2.5*  --   --   --    GFR: Estimated Creatinine Clearance: 26.7 mL/min (A) (by C-G formula based on SCr of 1.22 mg/dL (H)). Liver Function Tests: No results for input(s): AST, ALT, ALKPHOS, BILITOT, PROT, ALBUMIN in the last 168 hours. No results for input(s): LIPASE, AMYLASE in the last 168 hours. No results for input(s): AMMONIA in the last 168 hours. Coagulation Profile: Recent Labs  Lab 2019-03-01 1653 02/10/19 0348 02/11/19 0634 02/12/19 0323 02/13/19 0612  INR 2.5* 2.8* 2.2* 1.3* 1.3*   Cardiac Enzymes: No results for input(s): CKTOTAL, CKMB, CKMBINDEX, TROPONINI in the last 168 hours. BNP (last 3 results) No results for  input(s): PROBNP in the last 8760 hours. HbA1C: No results for input(s): HGBA1C in the last 72 hours. CBG: No results for input(s): GLUCAP in the last 168 hours. Lipid Profile: No results for input(s): CHOL, HDL, LDLCALC, TRIG, CHOLHDL, LDLDIRECT in the last 72 hours. Thyroid Function Tests: No results for input(s): TSH, T4TOTAL, FREET4, T3FREE, THYROIDAB in the last 72 hours. Anemia Panel: No results for input(s): VITAMINB12, FOLATE, FERRITIN, TIBC, IRON, RETICCTPCT in the last 72 hours. Sepsis Labs: No results for input(s): PROCALCITON, LATICACIDVEN in the last 168 hours.  Recent Results (from the past 240 hour(s))  Respiratory Panel by  RT PCR (Flu A&B, Covid) - Nasopharyngeal Swab     Status: None   Collection Time: 02/09/19  5:59 PM   Specimen: Nasopharyngeal Swab  Result Value Ref Range Status   SARS Coronavirus 2 by RT PCR NEGATIVE NEGATIVE Final    Comment: (NOTE) SARS-CoV-2 target nucleic acids are NOT DETECTED. The SARS-CoV-2 RNA is generally detectable in upper respiratoy specimens during the acute phase of infection. The lowest concentration of SARS-CoV-2 viral copies this assay can detect is 131 copies/mL. A negative result does not preclude SARS-Cov-2 infection and should not be used as the sole basis for treatment or other patient management decisions. A negative result may occur with  improper specimen collection/handling, submission of specimen other than nasopharyngeal swab, presence of viral mutation(s) within the areas targeted by this assay, and inadequate number of viral copies (<131 copies/mL). A negative result must be combined with clinical observations, patient history, and epidemiological information. The expected result is Negative. Fact Sheet for Patients:  PinkCheek.be Fact Sheet for Healthcare Providers:  GravelBags.it This test is not yet ap proved or cleared by the [place] FDA and  has been authorized for detection and/or diagnosis of SARS-CoV-2 by FDA under an Emergency Use Authorization (EUA). This EUA will remain  in effect (meaning this test can be used) for the duration of the COVID-19 declaration under Section 564(b)(1) of the Act, 21 U.S.C. section 360bbb-3(b)(1), unless the authorization is terminated or revoked sooner.    Influenza A by PCR NEGATIVE NEGATIVE Final   Influenza B by PCR NEGATIVE NEGATIVE Final    Comment: (NOTE) The Xpert Xpress SARS-CoV-2/FLU/RSV assay is intended as an aid in  the diagnosis of influenza from Nasopharyngeal swab specimens and  should not be used as a sole basis for treatment.  Nasal washings and  aspirates are unacceptable for Xpert Xpress SARS-CoV-2/FLU/RSV  testing. Fact Sheet for Patients: PinkCheek.be Fact Sheet for Healthcare Providers: GravelBags.it This test is not yet approved or cleared by the [place] FDA and  has been authorized for detection and/or diagnosis of SARS-CoV-2 by  FDA under an Emergency Use Authorization (EUA). This EUA will remain  in effect (meaning this test can be used) for the duration of the  Covid-19 declaration under Section 564(b)(1) of the Act, 21  U.S.C. section 360bbb-3(b)(1), unless the authorization is  terminated or revoked. Performed at [facility], [address], [place], [place] [address]   Surgical PCR screen     Status: None   Collection Time: 02/12/19  3:01 AM   Specimen: Nasal Mucosa; Nasal Swab  Result Value Ref Range Status   MRSA, PCR NEGATIVE NEGATIVE Final   Staphylococcus aureus NEGATIVE NEGATIVE Final    Comment: (NOTE) The Xpert SA Assay (FDA approved for NASAL specimens in patients 51 years of age and older), is one component of a comprehensive surveillance program. It is not  intended to diagnose infection nor to guide or monitor treatment. Performed at [facility], [address] [place], [place] [address]          Radiology Studies: DG HIP OPERATIVE UNILAT W OR W/O PELVIS LEFT  Result Date: 02/12/2019 CLINICAL DATA:  Left hip hemiarthroplasty EXAM: OPERATIVE LEFT HIP (WITH PELVIS IF PERFORMED) AP VIEWS TECHNIQUE: Fluoroscopic spot image(s) were submitted for interpretation post-operatively. COMPARISON:  02/09/2019 FINDINGS: A single C-arm fluoroscopic image was obtained intraoperatively and submitted for post operative interpretation. Partially visualized left hip hemiarthroplasty hardware, grossly intact. 12 seconds of fluoroscopy time was utilized. Please see the performing provider's procedural report for  further detail. IMPRESSION: As above. Electronically Signed   By: Davina Poke D.O.   On: 02/12/2019 12:18   DG HIP UNILAT W OR W/O PELVIS 2-3 VIEWS LEFT  Result Date: 02/12/2019 CLINICAL DATA:  Left hip hemiarthroplasty EXAM: DG HIP (WITH OR WITHOUT PELVIS) 2-3V LEFT COMPARISON:  02/09/2019 FINDINGS: Interval postsurgical changes from left hip hemiarthroplasty. Arthroplasty components appear in their expected alignment without evidence of immediate postoperative complication. Bones appear demineralized. Expected postoperative changes within the overlying soft tissues. Prominent vascular calcifications. IMPRESSION: Interval left hip hemiarthroplasty without evidence of immediate postoperative complication. Electronically Signed   By: Davina Poke D.O.   On: 02/12/2019 12:42        Scheduled Meds: . Chlorhexidine Gluconate Cloth  6 each Topical Daily  . docusate sodium  100 mg Oral BID  . doxepin  10 mg Oral Daily  . enoxaparin (LOVENOX) injection  50 mg Subcutaneous Q24H  . feeding supplement (ENSURE ENLIVE)  237 mL Oral BID BM  . levothyroxine  50 mcg Oral QAC breakfast  . magnesium oxide  400 mg Oral Daily  . senna  1 tablet Oral BID  . simvastatin  20 mg Oral Daily  . warfarin  6 mg Oral ONCE-1800  . Warfarin - Pharmacist Dosing Inpatient   Does not apply q1800   Continuous Infusions: . lactated ringers 100 mL/hr at 02/13/19 7209    Assessment & Plan:   Principal Problem:   Hip fracture, left ([place]) Active Problems:   Atrial fibrillation (HCC)   Arteriosclerosis of coronary artery   Chronic kidney disease (CKD), stage III (moderate)   HLD (hyperlipidemia)   Osteoporosis, post-menopausal   Hypothyroidism   Protein-calorie malnutrition, severe   Fall   Hip fracture, left Displaced left femoral neck fracture secondary to mechanical fall. --Ortho following-s/p left hemihip arthroplasty 02/12/19 --surgery was initially held until INR comes down --> Ortho gave IV vit K  already.  Postop: pt was hypotensive, given IVF boluses, urine outpt was good earlier then less . Bladder scan negative. Now on ivf maintenance. Monitor I/o. --pain control - Tylenol, low dose IV Toradol (stable CKD, monitor renal function), oxycodone, morphine  --bowel regimen --PT-recommend SNF Weightbearing as tolerated   Atrial fibrillation- rate controlled. On warfarin. --a/c on hold for surgery --resume coumadin when deemed safe postop by ortho --continue Toprol  Coronary arterydisease- stable, no chest pain Valvular Heart Disease- stable --no need for telemetry unless a-fib rate uncontrolled --continue spironolactone..> hold since with a/cki  HLD (hyperlipidemia) --continue simvastatin  Acute on Chronic kidney disease (CKD), stage III (moderate)- Stable. --creatinine increased.  -Hold Aldactone for now Start gentle hydration Monitor volume status and renal function Monitor labs  Osteoporosis, post-menopausal- with multiple fragility fractures including current left hip, prior right hip and multiple vertebral compression fractures. Does not ppear to be on home medication  for this. --recommend initiation of therapy ASAP, follow up with PCP --Continue her home vitamin D vitamin D   Hypothyroidism - continue Synthroid  GERD - continue Protonix   Other - continue Doxepin   DVT prophylaxis:Lovenox Code Status: Full Code Family Communication: None at bedside  Disposition Plan:Will need to go to SNF, will discuss with case management.  Also need to monitor creatinine as patient is seen AKI and was hypotensive       LOS: 4 days   Time spent:45 min with >50% on coc    Nolberto Hanlon, MD [facility] Pager [phone]  If 7PM-7AM, please contact night-coverage [email or web address] Password [facility] 02/13/2019, 4:49 PM Patient ID: Elizabeth Fields, female   DOB: 1931/10/19, 86 y.o.   MRN: [id number]

## 2019-02-13 NOTE — Plan of Care (Signed)

## 2019-02-13 NOTE — Care Management Important Message (Signed)
Important Message  Patient Details  Name: Elizabeth Fields MRN: [id number] Date of Birth: 08/07/1931   Medicare Important Message Given:  Yes     [name] 02/13/2019, 10:39 AM

## 2019-02-13 NOTE — Progress Notes (Signed)
Physical Therapy Treatment Patient Details Name: Elizabeth Fields MRN: [id number] DOB: August 18, 1931 Today's Date: 02/13/2019    History of Present Illness Pt is an 86 y.o. female presenting to hospital 02/09/19 s/p fall with L hip pain; imaging showing displaced L femoral neck fx with resultant varus deformity; surgery delayed d/t elevated INR.  Pt s/p L anterior approach hemi-hip arthroplasty 02/12/19.  PMH includes T12, L1, and L4 compression fx's, a-fib, CHF, htn, CKD, Bergmann syndrome, h/o R hip fx.    PT Comments    Pt seen for PT/OT co-session.  Pt resting in bed upon therapy arrival and agreeable to therapy with encouragement of her husband.  Donned brief prior to OOB mobility.  Tolerated LE ex's in bed fairly well.  2 assist with bed mobility.  Pt's HR fluctuating between 102-122 bpm at rest and increased to 135 bpm with sitting edge of bed (pt appearing fatigued sitting edge of bed and gradually with increased flexed sitting posture so pt assisted back to bed to rest and brief doffed).  Pt also reporting nausea.  Nurse notified of pt's nausea and HR during sessions activities.  Will continue to progress pt with strengthening and progressive functional mobility per pt tolerance.      Follow Up Recommendations  SNF     Equipment Recommendations  Rolling walker with 5" wheels;3in1 (PT);Wheelchair (measurements PT);Wheelchair cushion (measurements PT);Hospital bed    Recommendations for Other Services OT consult     Precautions / Restrictions Precautions Precautions: Anterior Hip;Fall Precaution Booklet Issued: Yes (comment) Restrictions Weight Bearing Restrictions: Yes LLE Weight Bearing: Weight bearing as tolerated    Mobility  Bed Mobility Overal bed mobility: Needs Assistance Bed Mobility: Supine to Sit;Sit to Supine;Rolling Rolling: Mod assist   Supine to sit: Min assist;Mod assist;+2 for physical assistance Sit to supine: Max assist;+2 for physical assistance   General bed  mobility comments: assist for trunk and B LE's semi-supine to/from sitting; vc's for technique; logrolling L and R in bed to donn briefs and doff briefs in bed end of session  Transfers       General transfer comment: deferred d/t elevated HR with sitting edge of bed  Ambulation/Gait     General Gait Details: deferred d/t elevated HR with sitting edge of bed   Stairs             Wheelchair Mobility    Modified Rankin (Stroke Patients Only)       Balance Overall balance assessment: Needs assistance Sitting-balance support: Single extremity supported;Feet supported Sitting balance-[name]: Poor Sitting balance - Comments: pt requiring at least single UE support for static sitting balance; pt tending to lean forward in sitting requiring cueing for upright posture   Standing balance support: Bilateral upper extremity supported Standing balance-[name]: Poor Standing balance comment: pt requiring B UE support on RW for static standing balance                            Cognition Arousal/Alertness: Awake/alert Behavior During Therapy: WFL for tasks assessed/performed                                   General Comments: Pt HOH (even with hearing aides in) and requiring extra time and cueing at times      Exercises General Exercises - Lower Extremity Short Arc Quad: AAROM;Strengthening;Left;10 reps;Supine Heel Slides: AAROM;Strengthening;Both;10 reps;Supine Hip  ABduction/ADduction: AAROM;Strengthening;Left;10 reps;Supine    General Comments General comments (skin integrity, edema, etc.): Drainage noted L hip dressing beginning/end of session      Pertinent Vitals/Pain Pain Score: 4  Pain Location: L hip Pain Descriptors / Indicators: Sore;Tender;Grimacing;Guarding Pain Intervention(s): Limited activity within patient's tolerance;Monitored during session;Repositioned  O2 sats WFL on 1 L O2 via nasal cannula during sessions  activities.    Home Living Family/patient expects to be discharged to:: Private residence Living Arrangements: Spouse/significant other Available Help at Discharge: Family Type of Home: House Home Access: Stairs to enter Entrance Stairs-Rails: Left Home Layout: One level Home Equipment: Environmental consultant - 2 wheels;Cane - single point      Prior Function Level of Independence: Independent(except supervision for baths and husband drives)          PT Goals (current goals can now be found in the care plan section) Acute Rehab PT Goals Patient Stated Goal: to improve mobility PT Goal Formulation: With patient Time For Goal Achievement: 02/27/19 Potential to Achieve Goals: Fair Progress towards PT goals: Progressing toward goals    Frequency    BID      PT Plan Current plan remains appropriate    Co-evaluation PT/OT/SLP Co-Evaluation/Treatment: Yes Reason for Co-Treatment: For patient/therapist safety;To address functional/ADL transfers PT goals addressed during session: Mobility/safety with mobility;Balance;Strengthening/ROM;Proper use of DME OT goals addressed during session: ADL's and self-care;Proper use of Adaptive equipment and DME;Strengthening/ROM      AM-PAC PT "6 Clicks" Mobility   Outcome Measure  Help needed turning from your back to your side while in a flat bed without using bedrails?: A Lot Help needed moving from lying on your back to sitting on the side of a flat bed without using bedrails?: A Lot Help needed moving to and from a bed to a chair (including a wheelchair)?: Total Help needed standing up from a chair using your arms (e.g., wheelchair or bedside chair)?: Total Help needed to walk in hospital room?: Total Help needed climbing 3-5 steps with a railing? : Total 6 Click Score: 8    End of Session Equipment Utilized During Treatment: Gait belt;Oxygen(1 L O2 via nasal cannula) Activity Tolerance: Patient limited by fatigue;Other (comment)(Limited d/t HR  increase with activity) Patient left: in bed;with call bell/phone within reach;with bed alarm set;Other (comment)(B heels floating via pillow) Nurse Communication: Mobility status;Precautions(pt requesting nausea meds) PT Visit Diagnosis: Other abnormalities of gait and mobility (R26.89);Muscle weakness (generalized) (M62.81);History of falling (Z91.81);Difficulty in walking, not elsewhere classified (R26.2);Pain Pain - Right/Left: Left Pain - part of body: Hip     Time: 1340-1414 PT Time Calculation (min) (ACUTE ONLY): 34 min  Charges:  $Therapeutic Exercise: 8-22 mins $Therapeutic Activity: 8-22 mins                     Leitha Bleak, PT 02/13/19, 4:27 PM

## 2019-02-13 NOTE — NC FL2 (Signed)
[place] LEVEL OF CARE SCREENING TOOL     IDENTIFICATION  Patient Name: Elizabeth Fields Birthdate: [birth year] Sex: female Admission Date (Current Location): 02/09/2019  [place] and [place] Number:  Engineering geologist and Address:  [facility], [address], [place], [place] [address]      Provider Number: [id number]  Attending Physician Name and Address:  Nolberto Hanlon, MD  Relative Name and Phone Number:  Alveta Heimlich husband [phone]    Current Level of Care: Hospital Recommended Level of Care: [place] Prior Approval Number:    Date Approved/Denied:   PASRR Number: [id number]  Discharge Plan: SNF    Current Diagnoses: Patient Active Problem List   Diagnosis Date Noted  . Fall   . Hypothyroidism 02/10/2019  . Protein-calorie malnutrition, severe 02/10/2019  . Hip fracture, left ([place]) 02/09/2019  . Compression fracture of L4 lumbar vertebra 09/18/2015  . Lumbar spondylosis 09/06/2015  . Chronic pain 09/06/2015  . Chronic sacroiliac joint pain (Left) 07/28/2015  . Arteriosclerosis of coronary artery 07/28/2015  . Bergmann's syndrome 07/28/2015  . HLD (hyperlipidemia) 07/28/2015  . Billowing mitral valve 07/28/2015  . Osteoporosis, post-menopausal 07/28/2015  . Lumbar facet syndrome (Bilateral) 07/28/2015  . Chronic low back pain (Bilateral) 07/28/2015  . Compression fracture of L1 lumbar vertebra ([place]) 07/28/2015  . Compression fracture of T12 vertebra ([place]) 07/28/2015  . Breathlessness on exertion 02/14/2015  . Vitamin B12 deficiency anemia 04/26/2014  . Acquired atrophy of thyroid 01/20/2014  . Chronic kidney disease (CKD), stage III (moderate) 10/20/2013  . Beat, premature ventricular 08/26/2013  . History of cardiac catheterization 08/26/2013  . MI (mitral incompetence) 08/26/2013  . TI (tricuspid incompetence) 08/26/2013  . Atrial fibrillation ([place]) 05/05/2013    Orientation RESPIRATION BLADDER  Height & Weight     Self, Time, Situation, Place  Normal Continent Weight: 124 lb 5.4 oz (56.4 kg) Height:  5\' 2"  (157.5 cm)  BEHAVIORAL SYMPTOMS/MOOD NEUROLOGICAL BOWEL NUTRITION STATUS      Continent Diet(regular thin liquids)  AMBULATORY STATUS COMMUNICATION OF NEEDS Skin   Limited Assist Verbally Surgical wounds                       Personal Care Assistance Level of Assistance  Bathing, Dressing Bathing Assistance: Limited assistance   Dressing Assistance: Limited assistance     Functional Limitations Info             SPECIAL CARE FACTORS FREQUENCY  PT (By licensed PT), OT (By licensed OT)     PT Frequency: 5x week OT Frequency: 5 x week            Contractures Contractures Info: Not present    Additional Factors Info  Code Status, Allergies Code Status Info: Full Code Allergies Info: Fosamax, Pencillins, Tramadol           Current Medications (02/13/2019):  This is the current hospital active medication list Current Facility-Administered Medications  Medication Dose Route Frequency Provider Last Rate Last Admin  . acetaminophen (TYLENOL) tablet 1,000 mg  1,000 mg Oral Q8H PRN Hessie Knows, MD   1,000 mg at 02/12/19 2031  . alum & mag hydroxide-simeth (MAALOX/MYLANTA) 200-200-20 MG/5ML suspension 30 mL  30 mL Oral Q4H PRN Hessie Knows, MD      . bisacodyl (DULCOLAX) EC tablet 5 mg  5 mg Oral Daily PRN Hessie Knows, MD      . bisacodyl (DULCOLAX) suppository 10 mg  10 mg Rectal Daily  PRN Hessie Knows, MD      . Chlorhexidine Gluconate Cloth 2 % PADS 6 each  6 each Topical Daily Nolberto Hanlon, MD      . diphenhydrAMINE (BENADRYL) 12.5 MG/5ML elixir 12.5-25 mg  12.5-25 mg Oral Q4H PRN Hessie Knows, MD      . docusate sodium (COLACE) capsule 100 mg  100 mg Oral BID Hessie Knows, MD   100 mg at 02/13/19 0829  . doxepin (SINEQUAN) capsule 10 mg  10 mg Oral Daily Hessie Knows, MD   10 mg at 02/13/19 0973  . enoxaparin (LOVENOX) injection 50 mg  50 mg  Subcutaneous Q24H Hessie Knows, MD   50 mg at 02/13/19 5329  . feeding supplement (ENSURE ENLIVE) (ENSURE ENLIVE) liquid 237 mL  237 mL Oral BID BM Hessie Knows, MD   237 mL at 02/11/19 1059  . lactated ringers infusion   Intravenous Continuous Nolberto Hanlon, MD 100 mL/hr at 02/13/19 0829 New Bag at 02/13/19 0829  . levothyroxine (SYNTHROID) tablet 50 mcg  50 mcg Oral QAC breakfast Hessie Knows, MD   50 mcg at 02/13/19 0504  . magnesium citrate solution 1 Bottle  1 Bottle Oral PRN Hessie Knows, MD      . magnesium hydroxide (MILK OF MAGNESIA) suspension 30 mL  30 mL Oral Daily PRN Hessie Knows, MD      . magnesium oxide (MAG-OX) tablet 400 mg  400 mg Oral Daily Hessie Knows, MD   400 mg at 02/13/19 0829  . menthol-cetylpyridinium (CEPACOL) lozenge 3 mg  1 lozenge Oral PRN Hessie Knows, MD       Or  . phenol (CHLORASEPTIC) mouth spray 1 spray  1 spray Mouth/Throat PRN Hessie Knows, MD      . methocarbamol (ROBAXIN) tablet 750 mg  750 mg Oral Q6H PRN Hessie Knows, MD   750 mg at 02/11/19 1100  . metoCLOPramide (REGLAN) tablet 5-10 mg  5-10 mg Oral Q8H PRN Hessie Knows, MD       Or  . metoCLOPramide (REGLAN) injection 5-10 mg  5-10 mg Intravenous Q8H PRN Hessie Knows, MD      . morphine 2 MG/ML injection 0.5 mg  0.5 mg Intravenous Q2H PRN Hessie Knows, MD      . ondansetron [facility]) tablet 4 mg  4 mg Oral Q6H PRN Hessie Knows, MD       Or  . ondansetron [facility]) injection 4 mg  4 mg Intravenous Q6H PRN Hessie Knows, MD      . polyethylene glycol (MIRALAX / GLYCOLAX) packet 17 g  17 g Oral Daily PRN Hessie Knows, MD      . senna (SENOKOT) tablet 8.6 mg  1 tablet Oral BID Hessie Knows, MD   8.6 mg at 02/13/19 9242  . simvastatin (ZOCOR) tablet 20 mg  20 mg Oral Daily Hessie Knows, MD   20 mg at 02/13/19 0830  . warfarin (COUMADIN) tablet 6 mg  6 mg Oral ONCE-1800 Nolberto Hanlon, MD      . Warfarin - Pharmacist Dosing Inpatient   Does not apply q1800 Lu Duffel, RPH      .  zolpidem (AMBIEN) tablet 5 mg  5 mg Oral QHS PRN Hessie Knows, MD         Discharge Medications: Please see discharge summary for a list of discharge medications.  Relevant Imaging Results:  Relevant Lab Results:   Additional Information SSN: [id number]  Leisa Gault, Gardiner Rhyme, LCSW

## 2019-02-13 NOTE — Progress Notes (Signed)
OT Cancellation Note  Patient Details Name: Elizabeth Fields MRN: [id number] DOB: May 16, 1931   Cancelled Treatment:    Reason Eval/Treat Not Completed: Other (comment). Consult received, chart reviewed. PT with pt for assessment. Will re-attempt at later date/time as pt is appropriate and available.  Jeni Salles, MPH, MS, OTR/L ascom [phone] 02/13/19, 10:07 AM

## 2019-02-13 NOTE — Evaluation (Signed)
Physical Therapy Evaluation Patient Details Name: Elizabeth Fields MRN: [id number] DOB: 03-19-1931 Today's Date: 02/13/2019   History of Present Illness  Pt is an 86 y.o. female presenting to hospital 02/09/19 s/p fall with L hip pain; imaging showing displaced L femoral neck fx with resultant varus deformity; surgery delayed d/t elevated INR.  Pt s/p L anterior approach hemi-hip arthroplasty 02/12/19.  PMH includes T12, L1, and L4 compression fx's, a-fib, CHF, htn, CKD, Bergmann syndrome, h/o R hip fx.  Clinical Impression  Prior to hospital admission, pt was independent with ambulation and lives with her husband in 1 level home with 2-3 STE with L railing.  Currently pt is 1-2 assist with bed mobility and 2 assist with transfers (see below for details).  Pt agreeable to participation but pt HOH and had difficulty following cueing during session complicating mobility efforts.  Upon therapist entering pt's room, pt reporting being incontinent of bowel in bed and NT came to assist with clean-up (loose stools noted).  Therapist set up pt's room to assist pt to [facility] because pt reporting she felt she could go a little more but pt with minor incontinence prior to getting OOB requiring assist for clean-up and then pt reporting not needing to use BSC anymore.  After getting pt to recliner, pt with multiple episodes of bowel incontinence in chair; d/t difficulty with cleaning pt up in chair and continued bowel incontinence/loose stools, pt assisted back to bed with 2 assist.  Nurse present during session assisting with transfers, bed mobility, and clean-up.  Pt would benefit from skilled PT to address noted impairments and functional limitations (see below for any additional details).  Upon hospital discharge, pt would benefit from STR.    Follow Up Recommendations SNF    Equipment Recommendations  Rolling walker with 5" wheels;3in1 (PT);Wheelchair (measurements PT);Wheelchair cushion (measurements PT);Hospital bed     Recommendations for Other Services OT consult     Precautions / Restrictions Precautions Precautions: Anterior Hip;Fall Precaution Booklet Issued: Yes (comment) Restrictions Weight Bearing Restrictions: Yes LLE Weight Bearing: Weight bearing as tolerated      Mobility  Bed Mobility Overal bed mobility: Needs Assistance Bed Mobility: Supine to Sit;Sit to Supine     Supine to sit: Mod assist;Max assist;HOB elevated Sit to supine: +2 for physical assistance   General bed mobility comments: assist for L>R LE and trunk semi-supine to sitting edge of bed with vc's for technique; 2 assist provided to lay down in bed for safety  Transfers Overall transfer level: Needs assistance Equipment used: Rolling walker (2 wheeled) Transfers: Sit to/from [facility] Sit to Stand: Min assist;Mod assist;+2 physical assistance Stand pivot transfers: Mod assist;Max assist;+2 physical assistance       General transfer comment: min to mod assist x2 to stand from bed up to RW; mod to max assist x2 stand pivot recliner to bed towards R side (pt had difficulty following cueing so required increased assist to keep pt safe; after sitting pt on edge of bed transferring back to bed pt immediately started to lay directly back in bed requiring assist for safety and then required 2 assist to reposition in bed)  Ambulation/Gait Ambulation/Gait assistance: Mod assist;+2 physical assistance Gait Distance (Feet): 3 Feet(bed to recliner) Assistive device: Rolling walker (2 wheeled)   Gait velocity: decreased   General Gait Details: Pt initially with difficulty taking steps with L LE but then able to advance with cueing and extra time/effort; initially able to take steps with R LE  but then pt had difficulty following cueing to continue taking steps to chair and knees were flexing requiring increased assist to keep pt safe and get pt to recliner; vc's required for overall gait technique and RW  use  Stairs            Wheelchair Mobility    Modified Rankin (Stroke Patients Only)       Balance Overall balance assessment: Needs assistance Sitting-balance support: Feet supported;Single extremity supported Sitting balance-[name]: Poor Sitting balance - Comments: pt requiring at least single UE support for static sitting balance   Standing balance support: Bilateral upper extremity supported Standing balance-[name]: Poor Standing balance comment: pt requiring B UE support on RW for static standing balance                             Pertinent Vitals/Pain Pain Assessment: Faces Faces Pain Scale: Hurts a little bit(2/10 at rest; 6/10 with activity) Pain Location: L hip Pain Descriptors / Indicators: Sore;Tender;Grimacing Pain Intervention(s): Limited activity within patient's tolerance;Monitored during session;Repositioned  HR 100-124 bpm during session's activities; O2 sats 91% or greater on 1 L O2 via nasal cannula    Home Living Family/patient expects to be discharged to:: Private residence Living Arrangements: Spouse/significant other Available Help at Discharge: Family Type of Home: House Home Access: Stairs to enter Entrance Stairs-Rails: Left Entrance Stairs-Number of Steps: 2-3 Home Layout: One level Home Equipment: Environmental consultant - 2 wheels;Cane - single point      Prior Function Level of Independence: Independent(except supervision for baths and husband drives)               Hand Dominance        Extremity/Trunk Assessment   Upper Extremity Assessment Upper Extremity Assessment: Generalized weakness    Lower Extremity Assessment Lower Extremity Assessment: Generalized weakness(R LE WFL; difficult to assess L LE d/t pt HOH)    Cervical / Trunk Assessment Cervical / Trunk Assessment: (forward head/shoulders)  Communication   Communication: HOH(B hearing aides)  Cognition Arousal/Alertness: Awake/alert Behavior During  Therapy: WFL for tasks assessed/performed                                   General Comments: Pt HOH and had difficulty with her hearing aides so communication difficult at times requiring extra time and cueing      General Comments General comments (skin integrity, edema, etc.): Drainage noted L hip dressing beginning/end of session.  Nursing cleared pt for participation in physical therapy.  Pt agreeable to PT session.  Pt's husband arrived during session.    Exercises     Assessment/Plan    PT Assessment Patient needs continued PT services  PT Problem List Decreased strength;Decreased activity tolerance;Decreased balance;Decreased mobility;Decreased knowledge of use of DME;Decreased safety awareness;Decreased knowledge of precautions;Pain;Decreased skin integrity       PT Treatment Interventions DME instruction;Gait training;Stair training;Functional mobility training;Therapeutic activities;Therapeutic exercise;Balance training;Patient/family education    PT Goals (Current goals can be found in the Care Plan section)  Acute Rehab PT Goals Patient Stated Goal: to improve mobility PT Goal Formulation: With patient Time For Goal Achievement: 02/27/19 Potential to Achieve Goals: Fair    Frequency BID   Barriers to discharge Decreased caregiver support      Co-evaluation               AM-PAC PT "6  Clicks" Mobility  Outcome Measure Help needed turning from your back to your side while in a flat bed without using bedrails?: A Lot Help needed moving from lying on your back to sitting on the side of a flat bed without using bedrails?: A Lot Help needed moving to and from a bed to a chair (including a wheelchair)?: Total Help needed standing up from a chair using your arms (e.g., wheelchair or bedside chair)?: Total Help needed to walk in hospital room?: Total Help needed climbing 3-5 steps with a railing? : Total 6 Click Score: 8    End of Session  Equipment Utilized During Treatment: Gait belt;Oxygen(2 L O2 via nasal cannula) Activity Tolerance: Patient limited by fatigue;Other (comment)(Complicated d/t bowel incontinence) Patient left: in bed(with nurse and NT present assisting pt with clean-up) Nurse Communication: Mobility status;Precautions PT Visit Diagnosis: Other abnormalities of gait and mobility (R26.89);Muscle weakness (generalized) (M62.81);History of falling (Z91.81);Difficulty in walking, not elsewhere classified (R26.2);Pain Pain - Right/Left: Left Pain - part of body: Hip    Time: 0955-1100 PT Time Calculation (min) (ACUTE ONLY): 65 min   Charges:   PT Evaluation $PT Eval Low Complexity: 1 Low PT Treatments $Therapeutic Activity: 38-52 mins        Leitha Bleak, PT 02/13/19, 12:36 PM

## 2019-02-13 NOTE — Progress Notes (Signed)
   Subjective: 1 Day Post-Op Procedure(s) (LRB): ANTERIOR APPROACH HEMI HIP ARTHROPLASTY (Left) Patient reports pain as 1 on 0-10 scale.   Patient is well, and has had no acute complaints or problems Denies any CP, SOB, ABD pain. We will continue therapy today.   Objective: Vital signs in last 24 hours: Temp:  [97.1 F (36.2 C)-99.3 F (37.4 C)] 97.2 F (36.2 C) (02/04 2309) Pulse Rate:  [79-107] 96 (02/04 2309) Resp:  [12-23] 16 (02/04 2309) BP: (79-126)/(47-73) 93/56 (02/04 2309) SpO2:  [89 %-100 %] 97 % (02/04 2309) Weight:  [51.2 kg-56.4 kg] 56.4 kg (02/05 0500)  Intake/Output from previous day: 02/04 0701 - 02/05 0700 In: 3888.5 [P.O.:50; I.V.:2738.5; IV Piggyback:1100] Out: 660 [Urine:560; Blood:100] Intake/Output this shift: No intake/output data recorded.  Recent Labs    02/11/19 0634 02/12/19 1630 02/13/19 0612  HGB 13.2 12.5 10.8*   Recent Labs    02/12/19 1630 02/13/19 0612  WBC 10.3 7.7  RBC 3.99 3.39*  HCT 37.8 33.1*  PLT 172 169   Recent Labs    02/12/19 1630 02/13/19 0612  NA 137 140  K 4.3 4.5  CL 104 107  CO2 18* 24  BUN 26* 29*  CREATININE 1.11* 1.22*  GLUCOSE 100* 113*  CALCIUM 8.3* 7.7*   Recent Labs    02/12/19 0323 02/13/19 0612  INR 1.3* 1.3*    EXAM General - Patient is Alert, Appropriate and Oriented Extremity - Neurovascular intact Sensation intact distally Intact pulses distally Dorsiflexion/Plantar flexion intact No cellulitis present Compartment soft Dressing - dressing C/D/I and scant drainage Motor Function - intact, moving foot and toes well on exam.   Past Medical History:  Diagnosis Date  . Allergy   . Atrial fibrillation ([place])   . CHF (congestive heart failure) ([place])   . History of squamous cell carcinoma   . Hyperlipidemia   . Hypertension   . Renal insufficiency   . Thyroid disease     Assessment/Plan:   1 Day Post-Op Procedure(s) (LRB): ANTERIOR APPROACH HEMI HIP ARTHROPLASTY  (Left) Principal Problem:   Hip fracture, left (HCC) Active Problems:   Atrial fibrillation (HCC)   Arteriosclerosis of coronary artery   Chronic kidney disease (CKD), stage III (moderate)   HLD (hyperlipidemia)   Osteoporosis, post-menopausal   Hypothyroidism   Protein-calorie malnutrition, severe   Fall  Estimated body mass index is 22.74 kg/m as calculated from the following:   Height as of this encounter: 5\' 2"  (1.575 m).   Weight as of this encounter: 56.4 kg. Advance diet Up with therapy, weightbearing as tolerated Acute post op blood loss anemia -hemoglobin 10.8.  Recheck hemoglobin in the morning. Care management to assist with discharge.  Will check progress with PT today.  DVT Prophylaxis - Coumadin, TED hose and SCDs Weight-Bearing as tolerated to left leg   T. Rachelle Hora, PA-C [place] 02/13/2019, 8:20 AM

## 2019-02-13 NOTE — Consult Note (Signed)
ANTICOAGULATION CONSULT NOTE - Follow Up Consult  Pharmacy Consult for Warfarin/Lovenox Indication: atrial fibrillation/stroke prevention  Allergies  Allergen Reactions  . Fosamax [Alendronate Sodium] Nausea And Vomiting  . Other Rash  . Penicillins Nausea And Vomiting and Palpitations  . Tramadol Nausea And Vomiting and Palpitations    Patient Measurements: Height: 5\' 2"  (157.5 cm) Weight: 124 lb 5.4 oz (56.4 kg) IBW/kg (Calculated) : 50.1  Vital Signs: Temp: 98 F (36.7 C) (02/05 0831) Temp Source: Oral (02/05 0831) BP: 106/57 (02/05 1037) Pulse Rate: 110 (02/05 1037)  Labs: Recent Labs    02/11/19 0634 02/11/19 0634 02/12/19 0323 02/12/19 1630 02/13/19 0612  HGB 13.2   < >  --  12.5 10.8*  HCT 38.9  --   --  37.8 33.1*  PLT 208  --   --  172 169  LABPROT 24.5*  --  16.3*  --  16.5*  INR 2.2*  --  1.3*  --  1.3*  CREATININE 1.67*   < > 1.39* 1.11* 1.22*   < > = values in this interval not displayed.    Estimated Creatinine Clearance: 26.7 mL/min (A) (by C-G formula based on SCr of 1.22 mg/dL (H)).  Medications:  Pt home dose of Warfarin is 4mg  daily.  Date Time INR Response  02/01  1653  2.5 ---- 02/02 0348 2.8  Vit K 1mg  @ 1439 02/03  0634  2.2 Vit K 5mg  @ 1749 02/04  0323 1.3 Surgery, warfarin 6mg  02/05   0612    1.3   Assessment: Pharmacy has been consulted to initiate and monitor Warfarin therapy for a fib.  Will likely take a few days to achieve therapeutic INR, even with aggressive Warfarin dosing.   Pt will likely need enoxaparin bridge.   CrCl = 29.3 ml/min  Goal of Therapy:  INR 2-3 Monitor platelets by anticoagulation protocol: Yes   Plan:  2/5 INR still subtherapeutic, most likely result of holding warfarin for several days and total of 6mg  Vit K. Will give warfarin 6mg  tonight and continue Enoxaparin 50mg  SQ q24h. Hgb/Hct have dropped slightly postop, will continue to follow.  Paulina Fusi, PharmD, BCPS 02/13/2019 11:12 AM

## 2019-02-13 NOTE — Anesthesia Postprocedure Evaluation (Signed)
Anesthesia Post Note  Patient: Elizabeth Fields  Procedure(s) Performed: ANTERIOR APPROACH HEMI HIP ARTHROPLASTY (Left )  Patient location during evaluation: Nursing Unit Anesthesia Type: Spinal Level of consciousness: awake, awake and alert and oriented Pain management: pain level controlled Vital Signs Assessment: post-procedure vital signs reviewed and stable Respiratory status: spontaneous breathing, nonlabored ventilation and respiratory function stable Cardiovascular status: stable Postop Assessment: no headache, no backache, patient able to bend at knees, no apparent nausea or vomiting and adequate PO intake Anesthetic complications: no     Last Vitals:  Vitals:   02/12/19 2138 02/12/19 2309  BP: (!) 93/53 (!) 93/56  Pulse: 95 96  Resp: 16 16  Temp: 36.5 C (!) 36.2 C  SpO2: 98% 97%    Last Pain:  Vitals:   02/12/19 2309  TempSrc: Oral  PainSc:                  Ricki Miller

## 2019-02-14 ENCOUNTER — Inpatient Hospital Stay: Payer: PPO

## 2019-02-14 DIAGNOSIS — Z8781 Personal history of (healed) traumatic fracture: Secondary | ICD-10-CM

## 2019-02-14 LAB — CBC
HCT: 32.5 % — ABNORMAL LOW (ref 36.0–46.0)
Hemoglobin: 10.6 g/dL — ABNORMAL LOW (ref 12.0–15.0)
MCH: 32 pg (ref 26.0–34.0)
MCHC: 32.6 g/dL (ref 30.0–36.0)
MCV: 98.2 fL (ref 80.0–100.0)
Platelets: 185 10*3/uL (ref 150–400)
RBC: 3.31 MIL/uL — ABNORMAL LOW (ref 3.87–5.11)
RDW: 14.9 % (ref 11.5–15.5)
WBC: 8.5 10*3/uL (ref 4.0–10.5)
nRBC: 0 % (ref 0.0–0.2)

## 2019-02-14 LAB — BASIC METABOLIC PANEL
Anion gap: 8 (ref 5–15)
BUN: 21 mg/dL (ref 8–23)
CO2: 24 mmol/L (ref 22–32)
Calcium: 8 mg/dL — ABNORMAL LOW (ref 8.9–10.3)
Chloride: 105 mmol/L (ref 98–111)
Creatinine, Ser: 1.21 mg/dL — ABNORMAL HIGH (ref 0.44–1.00)
GFR calc Af Amer: 47 mL/min — ABNORMAL LOW (ref >60)
GFR calc non Af Amer: 41 mL/min — ABNORMAL LOW (ref >60)
Glucose, Bld: 116 mg/dL — ABNORMAL HIGH (ref 70–99)
Potassium: 4.1 mmol/L (ref 3.5–5.1)
Sodium: 137 mmol/L (ref 135–145)

## 2019-02-14 LAB — PROTIME-INR
INR: 1.4 — ABNORMAL HIGH (ref 0.8–1.2)
Prothrombin Time: 17.2 seconds — ABNORMAL HIGH (ref 11.4–15.2)

## 2019-02-14 MED ORDER — ACETAMINOPHEN 500 MG PO TABS: 500.0000 mg | ORAL_TABLET | Freq: Three times a day (TID) | ORAL | 0 refills | Status: DC | PRN

## 2019-02-14 MED ORDER — HYDROCODONE-ACETAMINOPHEN 5-325 MG PO TABS
0.5000 | ORAL_TABLET | Freq: Four times a day (QID) | ORAL | Status: DC | PRN
  Administered 2019-02-14 – 2019-02-18 (×5): 0.5 via ORAL
  Filled 2019-02-14 (×6): qty 1

## 2019-02-14 MED ORDER — FAMOTIDINE 20 MG PO TABS
20.0000 mg | ORAL_TABLET | Freq: Every day | ORAL | Status: DC
  Administered 2019-02-15 – 2019-02-18 (×4): 20 mg via ORAL
  Filled 2019-02-14 (×4): qty 1

## 2019-02-14 MED ORDER — METOPROLOL SUCCINATE ER 25 MG PO TB24: 25.0000 mg | ORAL_TABLET | Freq: Two times a day (BID) | ORAL | Status: DC

## 2019-02-14 MED ORDER — HYDROCODONE-ACETAMINOPHEN 5-325 MG PO TABS: 0.5000 | ORAL_TABLET | Freq: Four times a day (QID) | ORAL | 0 refills | Status: DC | PRN

## 2019-02-14 MED ORDER — LACTATED RINGERS IV SOLN: INTRAVENOUS | Status: DC

## 2019-02-14 MED ORDER — LACTATED RINGERS IV BOLUS
1000.0000 mL | Freq: Once | INTRAVENOUS | Status: AC
  Administered 2019-02-15: 1000 mL via INTRAVENOUS

## 2019-02-14 MED ORDER — WARFARIN SODIUM 6 MG PO TABS
6.0000 mg | ORAL_TABLET | Freq: Once | ORAL | Status: AC
  Administered 2019-02-14: 19:00:00 6 mg via ORAL
  Filled 2019-02-14: qty 1

## 2019-02-14 MED ORDER — METOPROLOL SUCCINATE ER 25 MG PO TB24
12.5000 mg | ORAL_TABLET | Freq: Two times a day (BID) | ORAL | Status: DC
  Administered 2019-02-14 – 2019-02-15 (×2): 12.5 mg via ORAL
  Filled 2019-02-14 (×2): qty 1

## 2019-02-14 NOTE — Discharge Instructions (Signed)
Diet: As you were doing prior to hospitalization   Shower: Keep incision site clean and dry at all times  Dressing: Do not change honeycomb dressing unless it becomes saturated with drainage.  Dressing should last until your 2-week postop visit for staple removal.  Activity:  Increase activity slowly as tolerated, but follow the weight bearing instructions below.  No lifting or driving for 6 weeks.  Weight Bearing:   Weight bearing as tolerated to left lower extremity  To prevent constipation: you may use a stool softener such as -  Colace (over the counter) 100 mg by mouth twice a day  Drink plenty of fluids (prune juice may be helpful) and high fiber foods Miralax (over the counter) for constipation as needed.    Itching:  If you experience itching with your medications, try taking only a single pain pill, or even half a pain pill at a time.  You may take up to 10 pain pills per day, and you can also use benadryl over the counter for itching or also to help with sleep.   Precautions:  If you experience chest pain or shortness of breath - call 911 immediately for transfer to the hospital emergency department!!  If you develop a fever greater that 101 F, purulent drainage from wound, increased redness or drainage from wound, or calf pain-Call [facility]                                              Follow- Up Appointment:  Please call for an appointment to be seen in 2 weeks at [facility]

## 2019-02-14 NOTE — Progress Notes (Signed)
Physical Therapy Treatment Patient Details Name: Elizabeth Fields MRN: [id number] DOB: July 27, 1931 Today's Date: 02/14/2019    History of Present Illness Pt is an 86 y.o. female presenting to hospital 02/09/19 s/p fall with L hip pain; imaging showing displaced L femoral neck fx with resultant varus deformity; surgery delayed d/t elevated INR.  Pt s/p L anterior approach hemi-hip arthroplasty 02/12/19.  PMH includes T12, L1, and L4 compression fx's, a-fib, CHF, htn, CKD, Bergmann syndrome, h/o R hip fx    PT Comments    Patient is HOH and her hearing aide needs to be charged so communication is difficult. Patient performs bed mobility with max assist and sit <> supine with max assist. She transfers with max assist and RW and is able to take 4 steps towards the head of the bed, 2 steps , then seated rest, then 2 steps with max assist. Patient is not steady enough or strong enough to get to the recliner safely .  She performs AAROM and AROM BLE in sitting and supine. She will continue to benefit from skilled PT to improve mobility and strength.   Follow Up Recommendations  SNF     Equipment Recommendations       Recommendations for Other Services       Precautions / Restrictions Precautions Precautions: Anterior Hip Restrictions Weight Bearing Restrictions: Yes LLE Weight Bearing: Weight bearing as tolerated    Mobility  Bed Mobility Overal bed mobility: Needs Assistance Bed Mobility: Supine to Sit;Sit to Supine;Rolling Rolling: Mod assist   Supine to sit: Min assist;Mod assist;+2 for physical assistance Sit to supine: Max assist;+2 for physical assistance   General bed mobility comments: assist for trunk and B LE's semi-supine to/from sitting; vc's for technique; logrolling L and R in bed to donn briefs and doff briefs in bed end of session  Transfers Overall transfer level: Needs assistance Equipment used: Rolling walker (2 wheeled) Transfers: Sit to/from Stand Sit to Stand: Mod  assist            Ambulation/Gait Ambulation/Gait assistance: Mod assist Gait Distance (Feet): 4 Feet Assistive device: Rolling walker (2 wheeled) Gait Pattern/deviations: (side steps)         Stairs             Wheelchair Mobility    Modified Rankin (Stroke Patients Only)       Balance Overall balance assessment: Needs assistance Sitting-balance support: Single extremity supported;Feet supported Sitting balance-[name]: Poor Sitting balance - Comments: pt requiring at least single UE support for static sitting balance; pt tending to lean forward in sitting requiring cueing for upright posture                                    Cognition Arousal/Alertness: Awake/alert Behavior During Therapy: WFL for tasks assessed/performed                                   General Comments: Pt HOH (even with hearing aides in) and requiring extra time and cueing at times      Exercises General Exercises - Lower Extremity Ankle Circles/Pumps: Strengthening;15 reps Quad Sets: Strengthening;Right;Left;15 reps Gluteal Sets: Strengthening;15 reps Short Arc Quad: Strengthening;Both;15 reps Long Arc Quad: Strengthening;Right;Left;15 reps Heel Slides: Strengthening;Both;15 reps Straight Leg Raises: AAROM;Strengthening;15 reps    General Comments        Pertinent  Vitals/Pain Pain Assessment: 0-10 Pain Score: 3  Pain Location: l hip Pain Descriptors / Indicators: Aching Pain Intervention(s): Limited activity within patient's tolerance    Home Living                      Prior Function            PT Goals (current goals can now be found in the care plan section) Progress towards PT goals: Progressing toward goals    Frequency    BID      PT Plan Current plan remains appropriate    Co-evaluation     PT goals addressed during session: Mobility/safety with mobility;Proper use of DME;Strengthening/ROM         AM-PAC PT "6 Clicks" Mobility   Outcome Measure  Help needed turning from your back to your side while in a flat bed without using bedrails?: A Lot Help needed moving from lying on your back to sitting on the side of a flat bed without using bedrails?: A Lot Help needed moving to and from a bed to a chair (including a wheelchair)?: A Lot Help needed standing up from a chair using your arms (e.g., wheelchair or bedside chair)?: A Lot Help needed to walk in hospital room?: A Lot Help needed climbing 3-5 steps with a railing? : A Lot 6 Click Score: 12    End of Session Equipment Utilized During Treatment: Gait belt Activity Tolerance: Patient tolerated treatment well Patient left: in bed;with bed alarm set Nurse Communication: Mobility status PT Visit Diagnosis: Unsteadiness on feet (R26.81);Muscle weakness (generalized) (M62.81);Difficulty in walking, not elsewhere classified (R26.2) Pain - Right/Left: Left Pain - part of body: Hip     Time: 8032-1224 PT Time Calculation (min) (ACUTE ONLY): 25 min  Charges:  $Therapeutic Exercise: 8-22 mins $Therapeutic Activity: 8-22 mins                       Alanson Puls, PT DPT 02/14/2019, 9:40 AM

## 2019-02-14 NOTE — Progress Notes (Signed)
PROGRESS NOTE    Elizabeth Fields  BZJ:[id number] DOB: 14-Mar-1931 DOA: 02/09/2019 PCP: Baxter Hire, MD    Brief Narrative:  86 y.o.femalewith medical history ofA-Fibon warfarin, HTN, HLD, renal insufficiency, hypothyroidism, aortic and mitral valve disease, presented to the ED on 2/1 withleft hip pain after amechanicalfallat home after which she wasnot able to move that hip or put weight on itthe left leg.In the ED, vitals stable, labs unremarkable except INR 2.5. Left hip x-ray showed a displaced femoral neck fracture.     Consultants:   Orthopedics  Procedures: None  Antimicrobials:   None   Subjective: Patient sitting in bed.  Denies any pain, chest pain, shortness of breath.  Objective: Vitals:   02/14/19 0010 02/14/19 0039 02/14/19 0500 02/14/19 0752  BP: 123/61   111/61  Pulse: (!) 122 100  (!) 102  Resp: 16   17  Temp: 99.2 F (37.3 C)   98.1 F (36.7 C)  TempSrc: Oral   Oral  SpO2: 98%   98%  Weight:   55.4 kg   Height:        Intake/Output Summary (Last 24 hours) at 02/14/2019 1128 Last data filed at 02/14/2019 0615 Gross per 24 hour  Intake 1821.82 ml  Output -  Net 1821.82 ml   Filed Weights   02/12/19 0840 02/13/19 0500 02/14/19 0500  Weight: 51.2 kg 56.4 kg 55.4 kg    Examination:  General exam: Appears calm and comfortable, NAD Respiratory system: Clear to auscultation. Respiratory effort normal.  No wheezing Cardiovascular system: S1 & S2 heard, regular/irregular, mildly tacky, no JVD, murmurs, rubs, gallops or clicks. Gastrointestinal system: Abdomen is nondistended, soft and nontender. Normal bowel sounds heard.  No rebound OF:BPZWC in place  Central nervous system: Alert and oriented. No focal neurological deficits. Extremities: no edema or cyanosis, SCD in place Skin: warm /dry Psychiatry: Judgement and insight appear normal. Mood & affect appropriate.     Data Reviewed: I have personally reviewed following labs and  imaging studies  CBC: Recent Labs  Lab 02/09/19 1653 02/09/19 1653 02/10/19 0348 02/11/19 0634 02/12/19 1630 02/13/19 0612 02/14/19 0714  WBC 10.8*   < > 10.0 10.2 10.3 7.7 8.5  NEUTROABS 9.1*  --   --   --   --   --   --   HGB 14.0   < > 13.4 13.2 12.5 10.8* 10.6*  HCT 42.3   < > 40.8 38.9 37.8 33.1* 32.5*  MCV 94.8   < > 94.7 93.3 94.7 97.6 98.2  PLT 267   < > 225 208 172 169 185   < > = values in this interval not displayed.   Basic Metabolic Panel: Recent Labs  Lab 02/10/19 0348 02/10/19 0348 02/11/19 0634 02/12/19 0323 02/12/19 1630 02/13/19 0612 02/14/19 0714  NA 138   < > 140 137 137 140 137  K 3.9   < > 3.7 3.8 4.3 4.5 4.1  CL 98   < > 100 101 104 107 105  CO2 27   < > 27 25 18* 24 24  GLUCOSE 122*   < > 111* 106* 100* 113* 116*  BUN 26*   < > 38* 29* 26* 29* 21  CREATININE 1.22*   < > 1.67* 1.39* 1.11* 1.22* 1.21*  CALCIUM 9.1   < > 9.3 8.8* 8.3* 7.7* 8.0*  MG 2.5*  --  2.5*  --   --   --   --    < > =  values in this interval not displayed.   GFR: Estimated Creatinine Clearance: 26.9 mL/min (A) (by C-G formula based on SCr of 1.21 mg/dL (H)). Liver Function Tests: No results for input(s): AST, ALT, ALKPHOS, BILITOT, PROT, ALBUMIN in the last 168 hours. No results for input(s): LIPASE, AMYLASE in the last 168 hours. No results for input(s): AMMONIA in the last 168 hours. Coagulation Profile: Recent Labs  Lab 02/10/19 0348 02/11/19 0634 02/12/19 0323 02/13/19 0612 02/14/19 0714  INR 2.8* 2.2* 1.3* 1.3* 1.4*   Cardiac Enzymes: No results for input(s): CKTOTAL, CKMB, CKMBINDEX, TROPONINI in the last 168 hours. BNP (last 3 results) No results for input(s): PROBNP in the last 8760 hours. HbA1C: No results for input(s): HGBA1C in the last 72 hours. CBG: No results for input(s): GLUCAP in the last 168 hours. Lipid Profile: No results for input(s): CHOL, HDL, LDLCALC, TRIG, CHOLHDL, LDLDIRECT in the last 72 hours. Thyroid Function Tests: No results  for input(s): TSH, T4TOTAL, FREET4, T3FREE, THYROIDAB in the last 72 hours. Anemia Panel: No results for input(s): VITAMINB12, FOLATE, FERRITIN, TIBC, IRON, RETICCTPCT in the last 72 hours. Sepsis Labs: No results for input(s): PROCALCITON, LATICACIDVEN in the last 168 hours.  Recent Results (from the past 240 hour(s))  Respiratory Panel by RT PCR (Flu A&B, Covid) - Nasopharyngeal Swab     Status: None   Collection Time: 02/09/19  5:59 PM   Specimen: Nasopharyngeal Swab  Result Value Ref Range Status   SARS Coronavirus 2 by RT PCR NEGATIVE NEGATIVE Final    Comment: (NOTE) SARS-CoV-2 target nucleic acids are NOT DETECTED. The SARS-CoV-2 RNA is generally detectable in upper respiratoy specimens during the acute phase of infection. The lowest concentration of SARS-CoV-2 viral copies this assay can detect is 131 copies/mL. A negative result does not preclude SARS-Cov-2 infection and should not be used as the sole basis for treatment or other patient management decisions. A negative result may occur with  improper specimen collection/handling, submission of specimen other than nasopharyngeal swab, presence of viral mutation(s) within the areas targeted by this assay, and inadequate number of viral copies (<131 copies/mL). A negative result must be combined with clinical observations, patient history, and epidemiological information. The expected result is Negative. Fact Sheet for Patients:  PinkCheek.be Fact Sheet for Healthcare Providers:  GravelBags.it This test is not yet ap proved or cleared by the [place] FDA and  has been authorized for detection and/or diagnosis of SARS-CoV-2 by FDA under an Emergency Use Authorization (EUA). This EUA will remain  in effect (meaning this test can be used) for the duration of the COVID-19 declaration under Section 564(b)(1) of the Act, 21 U.S.C. section 360bbb-3(b)(1), unless the  authorization is terminated or revoked sooner.    Influenza A by PCR NEGATIVE NEGATIVE Final   Influenza B by PCR NEGATIVE NEGATIVE Final    Comment: (NOTE) The Xpert Xpress SARS-CoV-2/FLU/RSV assay is intended as an aid in  the diagnosis of influenza from Nasopharyngeal swab specimens and  should not be used as a sole basis for treatment. Nasal washings and  aspirates are unacceptable for Xpert Xpress SARS-CoV-2/FLU/RSV  testing. Fact Sheet for Patients: PinkCheek.be Fact Sheet for Healthcare Providers: GravelBags.it This test is not yet approved or cleared by the [place] FDA and  has been authorized for detection and/or diagnosis of SARS-CoV-2 by  FDA under an Emergency Use Authorization (EUA). This EUA will remain  in effect (meaning this test can be used) for the duration of the  Covid-19  declaration under Section 564(b)(1) of the Act, 21  U.S.C. section 360bbb-3(b)(1), unless the authorization is  terminated or revoked. Performed at [facility], [address], [place], [place] [address]   Surgical PCR screen     Status: None   Collection Time: 02/12/19  3:01 AM   Specimen: Nasal Mucosa; Nasal Swab  Result Value Ref Range Status   MRSA, PCR NEGATIVE NEGATIVE Final   Staphylococcus aureus NEGATIVE NEGATIVE Final    Comment: (NOTE) The Xpert SA Assay (FDA approved for NASAL specimens in patients 38 years of age and older), is one component of a comprehensive surveillance program. It is not intended to diagnose infection nor to guide or monitor treatment. Performed at [facility], [address] [place], [place] [address]          Radiology Studies: DG HIP OPERATIVE UNILAT W OR W/O PELVIS LEFT  Result Date: 02/12/2019 CLINICAL DATA:  Left hip hemiarthroplasty EXAM: OPERATIVE LEFT HIP (WITH PELVIS IF PERFORMED) AP VIEWS TECHNIQUE: Fluoroscopic spot image(s) were submitted for  interpretation post-operatively. COMPARISON:  02/09/2019 FINDINGS: A single C-arm fluoroscopic image was obtained intraoperatively and submitted for post operative interpretation. Partially visualized left hip hemiarthroplasty hardware, grossly intact. 12 seconds of fluoroscopy time was utilized. Please see the performing provider's procedural report for further detail. IMPRESSION: As above. Electronically Signed   By: Davina Poke D.O.   On: 02/12/2019 12:18   DG HIP UNILAT W OR W/O PELVIS 2-3 VIEWS LEFT  Result Date: 02/12/2019 CLINICAL DATA:  Left hip hemiarthroplasty EXAM: DG HIP (WITH OR WITHOUT PELVIS) 2-3V LEFT COMPARISON:  02/09/2019 FINDINGS: Interval postsurgical changes from left hip hemiarthroplasty. Arthroplasty components appear in their expected alignment without evidence of immediate postoperative complication. Bones appear demineralized. Expected postoperative changes within the overlying soft tissues. Prominent vascular calcifications. IMPRESSION: Interval left hip hemiarthroplasty without evidence of immediate postoperative complication. Electronically Signed   By: Davina Poke D.O.   On: 02/12/2019 12:42        Scheduled Meds: . Chlorhexidine Gluconate Cloth  6 each Topical Daily  . docusate sodium  100 mg Oral BID  . doxepin  10 mg Oral Daily  . enoxaparin (LOVENOX) injection  50 mg Subcutaneous Q24H  . feeding supplement (ENSURE ENLIVE)  237 mL Oral BID BM  . levothyroxine  50 mcg Oral QAC breakfast  . magnesium oxide  400 mg Oral Daily  . senna  1 tablet Oral BID  . simvastatin  20 mg Oral Daily  . warfarin  6 mg Oral ONCE-1800  . Warfarin - Pharmacist Dosing Inpatient   Does not apply q1800   Continuous Infusions: . lactated ringers Stopped (02/13/19 1837)  . lactated ringers 50 mL/hr at 02/14/19 0615    Assessment & Plan:   Principal Problem:   Hip fracture, left ([place]) Active Problems:   Atrial fibrillation (HCC)   Arteriosclerosis of coronary artery    Chronic kidney disease (CKD), stage III (moderate)   HLD (hyperlipidemia)   Osteoporosis, post-menopausal   Hypothyroidism   Protein-calorie malnutrition, severe   Fall   Hip fracture, left Displaced left femoral neck fracture secondary to mechanical fall. --Ortho following-s/p left hemihip arthroplasty 02/12/19 --surgery was initially held until INR comes down --> Ortho gave IV vit K already.  Postop: pt was hypotensive, given IVF boluses, urine outpt was good earlier then less . Bladder scan negative. Now on ivf maintenance, will change to 51ml/hr. Monitor I/o. --pain management -mostly requiring Tylenol --bowel regimen --PT-recommend SNF Weightbearing  as tolerated Follow-up with [facility] in 2 weeks for staple removal and Steri-Strip application TED hose bilateral lower extremities x6 weeks, okay to remove at nighttime. Continue with Coumadin Tylenol and Norco for pain.  Prescription for Norco in chart.  Atrial fibrillation- rate controlled. On warfarin. --a/c on hold for surgery --resume coumadin when deemed safe postop by ortho --mildly tachy, will start toprol xl 12.5mg  bid with parameters, increase to home dose as tolerated since bp stable now. inr goal 2-3  Coronary arterydisease- stable, no chest pain Valvular Heart Disease- stable --no need for telemetry unless a-fib rate uncontrolled --continue spironolactone..> hold since with a/cki  HLD (hyperlipidemia) --continue simvastatin  Acute on Chronic kidney disease (CKD), stage III (moderate)- Stable. --creatinine increased, slowly improving -Hold Aldactone for now continue gentle hydration Monitor volume status and renal function Monitor labs  Osteoporosis, post-menopausal- with multiple fragility fractures including current left hip, prior right hip and multiple vertebral compression fractures. Does not ppear to be on home medication for this. --recommend initiation of therapy ASAP, follow up  with PCP --Continue her home vitamin D vitamin D   Hypothyroidism - continue Synthroid  GERD - continue Protonix   Other - continue Doxepin   DVT prophylaxis:Lovenox,  Code Status: Full Code Family Communication: None at bedside  Disposition Plan:PT has recommended SNF.  Discussed this with patient.  Also informed case management.  Likely DC next week when SNF bed available.     LOS: 5 days   Time spent:45 min with >50% on coc    Nolberto Hanlon, MD [facility] Pager [phone]  If 7PM-7AM, please contact night-coverage [email or web address] Password [facility] 02/14/2019, 11:28 AM Patient ID: Elizabeth Fields, female   DOB: 19-Dec-1931, 86 y.o.   MRN: [id number]

## 2019-02-14 NOTE — Progress Notes (Signed)
Physical Therapy Treatment Patient Details Name: Elizabeth Fields MRN: [id number] DOB: 10-18-1931 Today's Date: 02/14/2019    History of Present Illness Pt is an 86 y.o. female presenting to hospital 02/09/19 s/p fall with L hip pain; imaging showing displaced L femoral neck fx with resultant varus deformity; surgery delayed d/t elevated INR.  Pt s/p L anterior approach hemi-hip arthroplasty 02/12/19.  PMH includes T12, L1, and L4 compression fx's, a-fib, CHF, htn, CKD, Bergmann syndrome, h/o R hip fx    PT Comments    Patient is ready to try to get up and walk. She performs BLE supine AAROM  Including heel slides, ankle pumps, SAQ, LAQ x 10. She needs mod assist for supine <> sit bed mobiltiy and is getting better at scooting out to the edge of the bed with extra time. She performs sit to stand x 2 reps and is able to ambulate 8 steps fwd/bwd, 4 steps side ways to get higher up in the bed. She is able to ambulate with mod assist for balance and assisting to move the RW. She is able to stand with RW and min guard. She will continue to benefit from skilled PT to improve mobility and strength.   Follow Up Recommendations  SNF     Equipment Recommendations       Recommendations for Other Services       Precautions / Restrictions Precautions Precautions: Anterior Hip Restrictions Weight Bearing Restrictions: Yes LLE Weight Bearing: Weight bearing as tolerated    Mobility  Bed Mobility Overal bed mobility: Needs Assistance Bed Mobility: Supine to Sit;Sit to Supine;Rolling Rolling: Mod assist   Supine to sit: Min assist;Mod assist;+2 for physical assistance Sit to supine: Mod assist   General bed mobility comments: vc for sequencing  Transfers Overall transfer level: Needs assistance Equipment used: Rolling walker (2 wheeled) Transfers: Sit to/from Stand Sit to Stand: Mod assist            Ambulation/Gait Ambulation/Gait assistance: Mod assist Gait Distance (Feet): 8  Feet Assistive device: Rolling walker (2 wheeled) Gait Pattern/deviations: Step-to pattern         Stairs             Wheelchair Mobility    Modified Rankin (Stroke Patients Only)       Balance Overall balance assessment: Needs assistance Sitting-balance support: Single extremity supported;Feet supported Sitting balance-[name]: Poor Sitting balance - Comments: pt requiring at least single UE support for static sitting balance; pt tending to lean forward in sitting requiring cueing for upright posture                                    Cognition Arousal/Alertness: Awake/alert Behavior During Therapy: WFL for tasks assessed/performed                                   General Comments: Pt HOH (even with hearing aides in) and requiring extra time and cueing at times      Exercises General Exercises - Lower Extremity Ankle Circles/Pumps: Strengthening;15 reps Quad Sets: Strengthening;Right;Left;15 reps Gluteal Sets: Strengthening;15 reps Short Arc Quad: Strengthening;Both;15 reps Long Arc Quad: Strengthening;Right;Left;15 reps Heel Slides: Strengthening;Both;15 reps Straight Leg Raises: AAROM;Strengthening;15 reps    General Comments        Pertinent Vitals/Pain Pain Assessment: No/denies pain Pain Score: 4  Pain Location: left  hip Pain Descriptors / Indicators: Aching Pain Intervention(s): Limited activity within patient's tolerance    Home Living                      Prior Function            PT Goals (current goals can now be found in the care plan section) Progress towards PT goals: Progressing toward goals    Frequency    BID      PT Plan Current plan remains appropriate    Co-evaluation     PT goals addressed during session: Mobility/safety with mobility        AM-PAC PT "6 Clicks" Mobility   Outcome Measure  Help needed turning from your back to your side while in a flat bed without  using bedrails?: A Lot Help needed moving from lying on your back to sitting on the side of a flat bed without using bedrails?: A Lot Help needed moving to and from a bed to a chair (including a wheelchair)?: A Lot Help needed standing up from a chair using your arms (e.g., wheelchair or bedside chair)?: A Lot Help needed to walk in hospital room?: A Lot Help needed climbing 3-5 steps with a railing? : A Lot 6 Click Score: 12    End of Session Equipment Utilized During Treatment: Gait belt Activity Tolerance: Patient tolerated treatment well Patient left: in bed;with bed alarm set Nurse Communication: Mobility status PT Visit Diagnosis: Unsteadiness on feet (R26.81);Muscle weakness (generalized) (M62.81);Difficulty in walking, not elsewhere classified (R26.2) Pain - Right/Left: Left Pain - part of body: Hip     Time: 1130-1200 PT Time Calculation (min) (ACUTE ONLY): 30 min  Charges:  $Gait Training: 8-22 mins $Therapeutic Exercise: 8-22 mins $Therapeutic Activity: 8-22 mins                       Alanson Puls, PT DPT 02/14/2019, 12:28 PM

## 2019-02-14 NOTE — Consult Note (Signed)
[place] for Warfarin/Lovenox Indication: atrial fibrillation/stroke prevention  Allergies  Allergen Reactions  . Fosamax [Alendronate Sodium] Nausea And Vomiting  . Other Rash  . Penicillins Nausea And Vomiting and Palpitations  . Tramadol Nausea And Vomiting and Palpitations    Patient Measurements: Height: 5\' 2"  (157.5 cm) Weight: 122 lb 2.2 oz (55.4 kg) IBW/kg (Calculated) : 50.1  Vital Signs: Temp: 98.1 F (36.7 C) (02/06 0752) Temp Source: Oral (02/06 0752) BP: 111/61 (02/06 0752) Pulse Rate: 102 (02/06 0752)  Labs: Recent Labs    02/12/19 0323 02/12/19 0323 02/12/19 1630 02/12/19 1630 02/13/19 0612 02/14/19 0714  HGB  --   --  12.5   < > 10.8* 10.6*  HCT  --   --  37.8  --  33.1* 32.5*  PLT  --   --  172  --  169 185  LABPROT 16.3*  --   --   --  16.5* 17.2*  INR 1.3*  --   --   --  1.3* 1.4*  CREATININE 1.39*   < > 1.11*  --  1.22* 1.21*   < > = values in this interval not displayed.    Estimated Creatinine Clearance: 26.9 mL/min (A) (by C-G formula based on SCr of 1.21 mg/dL (H)).  Medications:  Pt home dose of Warfarin is 4mg  daily.  Date Time INR Response  02/01  1653  2.5 ---- 02/02 0348 2.8  Vit K 1mg  @ 1439 02/03  0634  2.2 Vit K 5mg  @ 1749 02/04  0323 1.3 Surgery, warfarin 6mg  02/05   0612    1.3 Warfarin 6 mg  02/06 0714 1.4  Assessment: Pharmacy has been consulted to initiate and monitor Warfarin therapy for a fib.  Will likely take a few days to achieve therapeutic INR, even with aggressive Warfarin dosing. On enoxaparin bridge.   CrCl = 29.3 ml/min  Goal of Therapy:  INR 2-3 Monitor platelets by anticoagulation protocol: Yes   Plan:  2/6 INR remains subtherapeutic. Will give warfarin 6mg  tonight and continue Enoxaparin 50mg  SQ q24h. Hgb/Hct with slight drop postop, now stable. Ideally, patient to continue enoxaparin bridge for a minimum of 5 days AND until INR therapeutic x 2.  Dorena Bodo,  PharmD 02/14/2019 10:37 AM

## 2019-02-14 NOTE — Progress Notes (Addendum)
   Subjective: 2 Days Post-Op Procedure(s) (LRB): ANTERIOR APPROACH HEMI HIP ARTHROPLASTY (Left) Patient reports pain as mild.   Patient is well, and has had no acute complaints or problems Denies any CP, SOB, ABD pain. We will continue therapy today.   Objective: Vital signs in last 24 hours: Temp:  [97.4 F (36.3 C)-99.2 F (37.3 C)] 98.1 F (36.7 C) (02/06 0752) Pulse Rate:  [96-122] 102 (02/06 0752) Resp:  [16-17] 17 (02/06 0752) BP: (98-126)/(57-63) 111/61 (02/06 0752) SpO2:  [85 %-100 %] 98 % (02/06 0752) FiO2 (%):  [0.5 %] 0.5 % (02/06 0752) Weight:  [55.4 kg] 55.4 kg (02/06 0500)  Intake/Output from previous day: 02/05 0701 - 02/06 0700 In: 1821.8 [P.O.:250; I.V.:1571.8] Out: -  Intake/Output this shift: No intake/output data recorded.  Recent Labs    02/12/19 1630 02/13/19 0612 02/14/19 0714  HGB 12.5 10.8* 10.6*   Recent Labs    02/13/19 0612 02/14/19 0714  WBC 7.7 8.5  RBC 3.39* 3.31*  HCT 33.1* 32.5*  PLT 169 185   Recent Labs    02/13/19 0612 02/14/19 0714  NA 140 137  K 4.5 4.1  CL 107 105  CO2 24 24  BUN 29* 21  CREATININE 1.22* 1.21*  GLUCOSE 113* 116*  CALCIUM 7.7* 8.0*   Recent Labs    02/13/19 0612 02/14/19 0714  INR 1.3* 1.4*    EXAM General - Patient is Alert, Appropriate and Oriented Extremity - Neurovascular intact Sensation intact distally Intact pulses distally Dorsiflexion/Plantar flexion intact No cellulitis present Compartment soft Dressing - dressing C/D/I and scant drainage, new dressing applied this morning.  No active drainage. Motor Function - intact, moving foot and toes well on exam.   Past Medical History:  Diagnosis Date  . Allergy   . Atrial fibrillation ([place])   . CHF (congestive heart failure) ([place])   . History of squamous cell carcinoma   . Hyperlipidemia   . Hypertension   . Renal insufficiency   . Thyroid disease     Assessment/Plan:   2 Days Post-Op Procedure(s) (LRB): ANTERIOR  APPROACH HEMI HIP ARTHROPLASTY (Left) Principal Problem:   Hip fracture, left (HCC) Active Problems:   Atrial fibrillation (HCC)   Arteriosclerosis of coronary artery   Chronic kidney disease (CKD), stage III (moderate)   HLD (hyperlipidemia)   Osteoporosis, post-menopausal   Hypothyroidism   Protein-calorie malnutrition, severe   Fall  Estimated body mass index is 22.34 kg/m as calculated from the following:   Height as of this encounter: 5\' 2"  (1.575 m).   Weight as of this encounter: 55.4 kg. Advance diet Up with therapy, weightbearing as tolerated Acute post op blood loss anemia -hemoglobin 10.6.  Stable.  Vital signs stable Pain mostly controlled with Tylenol.  At times she states Tylenol does not help.  She requested Toradol that due to kidney function and Coumadin will avoid this medication.  She is allergic to tramadol.  We will try low-dose Norco as needed for severe pain. Care management to assist with discharge to skilled nursing facility.   Follow-up with [facility] orthopedics in 2 weeks for staple removal and Steri-Strip application TED hose bilateral lower extremities x6 weeks, okay to remove at nighttime. Continue with Coumadin Tylenol and Norco for pain.  Prescription for Norco in chart.    DVT Prophylaxis - Coumadin, TED hose and SCDs Weight-Bearing as tolerated to left leg   T. Rachelle Hora, PA-C [place] 02/14/2019, 8:19 AM

## 2019-02-15 DIAGNOSIS — M810 Age-related osteoporosis without current pathological fracture: Secondary | ICD-10-CM

## 2019-02-15 DIAGNOSIS — E039 Hypothyroidism, unspecified: Secondary | ICD-10-CM

## 2019-02-15 LAB — CBC WITH DIFFERENTIAL/PLATELET
Abs Immature Granulocytes: 0.22 10*3/uL — ABNORMAL HIGH (ref 0.00–0.07)
Basophils Absolute: 0.1 10*3/uL (ref 0.0–0.1)
Basophils Relative: 1 %
Eosinophils Absolute: 0.2 10*3/uL (ref 0.0–0.5)
Eosinophils Relative: 2 %
HCT: 28.8 % — ABNORMAL LOW (ref 36.0–46.0)
Hemoglobin: 9.4 g/dL — ABNORMAL LOW (ref 12.0–15.0)
Immature Granulocytes: 2 %
Lymphocytes Relative: 12 %
Lymphs Abs: 1.1 10*3/uL (ref 0.7–4.0)
MCH: 31.6 pg (ref 26.0–34.0)
MCHC: 32.6 g/dL (ref 30.0–36.0)
MCV: 97 fL (ref 80.0–100.0)
Monocytes Absolute: 0.7 10*3/uL (ref 0.1–1.0)
Monocytes Relative: 8 %
Neutro Abs: 6.9 10*3/uL (ref 1.7–7.7)
Neutrophils Relative %: 75 %
Platelets: 195 10*3/uL (ref 150–400)
RBC: 2.97 MIL/uL — ABNORMAL LOW (ref 3.87–5.11)
RDW: 15.1 % (ref 11.5–15.5)
WBC: 9.1 10*3/uL (ref 4.0–10.5)
nRBC: 0 % (ref 0.0–0.2)

## 2019-02-15 LAB — BASIC METABOLIC PANEL
Anion gap: 10 (ref 5–15)
BUN: 18 mg/dL (ref 8–23)
CO2: 20 mmol/L — ABNORMAL LOW (ref 22–32)
Calcium: 7.7 mg/dL — ABNORMAL LOW (ref 8.9–10.3)
Chloride: 102 mmol/L (ref 98–111)
Creatinine, Ser: 0.97 mg/dL (ref 0.44–1.00)
GFR calc Af Amer: >60 mL/min (ref >60)
GFR calc non Af Amer: 53 mL/min — ABNORMAL LOW (ref >60)
Glucose, Bld: 134 mg/dL — ABNORMAL HIGH (ref 70–99)
Potassium: 4 mmol/L (ref 3.5–5.1)
Sodium: 132 mmol/L — ABNORMAL LOW (ref 135–145)

## 2019-02-15 LAB — URINALYSIS, COMPLETE (UACMP) WITH MICROSCOPIC
Bacteria, UA: NONE SEEN
Bilirubin Urine: NEGATIVE
Glucose, UA: NEGATIVE mg/dL
Hgb urine dipstick: NEGATIVE
Ketones, ur: NEGATIVE mg/dL
Leukocytes,Ua: NEGATIVE
Nitrite: NEGATIVE
Protein, ur: 30 mg/dL — AB
Specific Gravity, Urine: 1.02 (ref 1.005–1.030)
Squamous Epithelial / HPF: NONE SEEN (ref 0–5)
pH: 5 (ref 5.0–8.0)

## 2019-02-15 LAB — PROCALCITONIN
Procalcitonin: 0.34 ng/mL
Procalcitonin: 0.37 ng/mL

## 2019-02-15 LAB — LACTIC ACID, PLASMA
Lactic Acid, Venous: 2.3 mmol/L (ref 0.5–1.9)
Lactic Acid, Venous: 1.5 mmol/L (ref 0.5–1.9)

## 2019-02-15 LAB — BRAIN NATRIURETIC PEPTIDE: B Natriuretic Peptide: 434 pg/mL — ABNORMAL HIGH (ref 0.0–100.0)

## 2019-02-15 LAB — PROTIME-INR
INR: 1.8 — ABNORMAL HIGH (ref 0.8–1.2)
Prothrombin Time: 20.6 seconds — ABNORMAL HIGH (ref 11.4–15.2)

## 2019-02-15 MED ORDER — METOPROLOL TARTRATE 5 MG/5ML IV SOLN
5.0000 mg | INTRAVENOUS | Status: DC | PRN
  Administered 2019-02-15: 01:00:00 5 mg via INTRAVENOUS
  Filled 2019-02-15: qty 5

## 2019-02-15 MED ORDER — FE FUMARATE-B12-VIT C-FA-IFC PO CAPS
1.0000 | ORAL_CAPSULE | Freq: Two times a day (BID) | ORAL | Status: DC
  Administered 2019-02-15 – 2019-02-18 (×6): 1 via ORAL
  Filled 2019-02-15 (×8): qty 1

## 2019-02-15 MED ORDER — ENOXAPARIN SODIUM 60 MG/0.6ML ~~LOC~~ SOLN
50.0000 mg | Freq: Two times a day (BID) | SUBCUTANEOUS | Status: DC
  Administered 2019-02-15 (×2): 50 mg via SUBCUTANEOUS
  Filled 2019-02-15 (×3): qty 0.6

## 2019-02-15 MED ORDER — METOPROLOL TARTRATE 25 MG PO TABS
12.5000 mg | ORAL_TABLET | Freq: Four times a day (QID) | ORAL | Status: DC
  Administered 2019-02-15 – 2019-02-17 (×9): 12.5 mg via ORAL
  Filled 2019-02-15 (×8): qty 1

## 2019-02-15 MED ORDER — FUROSEMIDE 10 MG/ML IJ SOLN
20.0000 mg | Freq: Once | INTRAMUSCULAR | Status: AC
  Administered 2019-02-15: 20 mg via INTRAVENOUS
  Filled 2019-02-15: qty 4

## 2019-02-15 MED ORDER — MORPHINE SULFATE (PF) 2 MG/ML IV SOLN
1.0000 mg | INTRAVENOUS | Status: DC | PRN
  Administered 2019-02-15: 1 mg via INTRAVENOUS
  Filled 2019-02-15: qty 1

## 2019-02-15 MED ORDER — WARFARIN SODIUM 4 MG PO TABS
4.0000 mg | ORAL_TABLET | Freq: Once | ORAL | Status: AC
  Administered 2019-02-15: 4 mg via ORAL
  Filled 2019-02-15: qty 1

## 2019-02-15 NOTE — Progress Notes (Addendum)
    BRIEF OVERNIGHT PROGRESS REPORT   SUBJECTIVE: Patient complained of pain in her left proximal thigh postoperative rating 9/10 on a pain intensity rating scale 0-10. She also spiked a fever of 101.5 associated with tachycardia HR up to 145bpm.  Patient denies shortness of breath or chest pain.  OBJECTIVE: Patient examined at the bedside, she was was febrile (low grade fever) with blood pressure 107/57 mm Hg and pulse rate 126 beats/min. There were no focal neurological deficits; she was alert and oriented x4. Very hard of hearing. Surgical site warm to touch with erythema but no hematoma or bleeding. Blanches easily to palpation.  ASSESSMENT: 86 year old female with past medical history of CKD stage III, CAD with 60% lesion of the LAD, anemia due to vitamin B12 deficiency, hypothyroidism, atrial fibrillation on Coumadin and aortic and mitral valve disease admitted with left hip fracture s/p repair postop day 3.  PLAN:  1. Fever - Likely early postoperative fever in a patient with recent left hip fracture status post repair day # 3. However will rule out other possible causes given elevated lactic acid, and tachycardia. -Chest x-ray obtained and shows no acute infectious cardiopulmonary process - Lactic acid 2.3 but trending down - Initial procalcitonin 0.35 without leukocytosis - UA shows no evidence of UTI - Urine cultures pending - Blood cultures pending - IVFs and PRN bolus to keep MAP<56mmHg or SBP <34mmHg  2. Left hip fracture - s/p repair postop day 3 - PRN pain management - Continue PT/OT - Orthopedic following  3. Paroxysmal atrial fibrillation -patient noted to be in RVR in the 130 s -We will add IV metoprolol as needed -Continue anticoagulation with Coumadin   Rufina Falco, DNP, CCRN, FNP-C [facility] Nurse Practitioner Between 7pm to [place] - Pager [phone]  After 7am go to [email or web address] - password:[email or web address]  [facility] [employer]   [phone]

## 2019-02-15 NOTE — Progress Notes (Signed)
Physical Therapy Treatment Patient Details Name: Elizabeth Fields MRN: [id number] DOB: 05/18/1931 Today's Date: 02/15/2019    History of Present Illness Pt is an 86 y.o. female presenting to hospital 02/09/19 s/p fall with L hip pain; imaging showing displaced L femoral neck fx with resultant varus deformity; surgery delayed d/t elevated INR.  Pt s/p L anterior approach hemi-hip arthroplasty 02/12/19.  PMH includes T12, L1, and L4 compression fx's, a-fib, CHF, htn, CKD, Bergmann syndrome, h/o R hip fx    PT Comments    Patient agrees to PT treatment. She performs AAROM to LLE and AROM RLE in supine and sitting. She needs head of hospital bed up and is able to scoot herself to edge of bed with extra time. It is a struggle for her to get to edge of bed with great effort. She performs sit to stand with min assist today and her initial standing balance is improved. She is able to take 10 steps fwd and bwd, and 4 steps sideways and today she is able to move the RW herself and also maintain her balance in standing. She needs assist sit to supine with BLE and then is able to scoot up the bed with the head of the bed lowered. She will continue to benefit from skilled PT to improve mobility and strength.   Follow Up Recommendations  SNF     Equipment Recommendations       Recommendations for Other Services       Precautions / Restrictions Precautions Precautions: Anterior Hip Restrictions Weight Bearing Restrictions: Yes LLE Weight Bearing: Weight bearing as tolerated    Mobility  Bed Mobility Overal bed mobility: Needs Assistance Bed Mobility: Supine to Sit;Sit to Supine Rolling: Min assist   Supine to sit: Min assist Sit to supine: Min assist   General bed mobility comments: vc for sequencing  Transfers Overall transfer level: Needs assistance Equipment used: Rolling walker (2 wheeled) Transfers: Sit to/from Stand Sit to Stand: Min assist            Ambulation/Gait Ambulation/Gait  assistance: Herbalist (Feet): 10 Feet Assistive device: Standard walker Gait Pattern/deviations: Step-to pattern         Stairs             Wheelchair Mobility    Modified Rankin (Stroke Patients Only)       Balance Overall balance assessment: Needs assistance Sitting-balance support: Single extremity supported;Feet supported Sitting balance-[name]: Good       Standing balance-[name]: Fair Standing balance comment: 2 hand supported                            Cognition Arousal/Alertness: Awake/alert Behavior During Therapy: WFL for tasks assessed/performed                                   General Comments: Pt HOH (even with hearing aides in) and requiring extra time and cueing at times      Exercises General Exercises - Lower Extremity Ankle Circles/Pumps: Strengthening;15 reps Quad Sets: Strengthening;Right;Left;15 reps Gluteal Sets: Strengthening;15 reps Short Arc Quad: Strengthening;Both;15 reps Long Arc Quad: Strengthening;Right;Left;15 reps Heel Slides: Strengthening;Both;15 reps Straight Leg Raises: AAROM;Strengthening;15 reps    General Comments        Pertinent Vitals/Pain Pain Assessment: 0-10 Pain Score: 5  Pain Location: l hip Pain Descriptors / Indicators: Aching Pain  Intervention(s): Limited activity within patient's tolerance;Monitored during session    Home Living                      Prior Function            PT Goals (current goals can now be found in the care plan section) Progress towards PT goals: Progressing toward goals    Frequency    BID      PT Plan Current plan remains appropriate    Co-evaluation     PT goals addressed during session: Mobility/safety with mobility        AM-PAC PT "6 Clicks" Mobility   Outcome Measure  Help needed turning from your back to your side while in a flat bed without using bedrails?: A Lot Help needed moving  from lying on your back to sitting on the side of a flat bed without using bedrails?: A Lot Help needed moving to and from a bed to a chair (including a wheelchair)?: A Little Help needed standing up from a chair using your arms (e.g., wheelchair or bedside chair)?: A Little Help needed to walk in hospital room?: A Little Help needed climbing 3-5 steps with a railing? : A Lot 6 Click Score: 15    End of Session Equipment Utilized During Treatment: Gait belt Activity Tolerance: Patient tolerated treatment well Patient left: in bed;with bed alarm set Nurse Communication: Mobility status PT Visit Diagnosis: Unsteadiness on feet (R26.81);Muscle weakness (generalized) (M62.81);Difficulty in walking, not elsewhere classified (R26.2) Pain - Right/Left: Left Pain - part of body: Hip     Time: 3646-8032 PT Time Calculation (min) (ACUTE ONLY): 38 min  Charges:  $Gait Training: 8-22 mins $Therapeutic Exercise: 8-22 mins $Therapeutic Activity: 8-22 mins                        Alanson Puls, PT DPT 02/15/2019, 9:53 AM

## 2019-02-15 NOTE — Progress Notes (Addendum)
PROGRESS NOTE    Elizabeth Fields  DGU:[id number] DOB: 08-25-1931 DOA: 02/09/2019 PCP: Baxter Hire, MD    Brief Narrative:  86 y.o.femalewith medical history ofA-Fibon warfarin, HTN, HLD, renal insufficiency, hypothyroidism, aortic and mitral valve disease, presented to the ED on 2/1 withleft hip pain after amechanicalfallat home after which she wasnot able to move that hip or put weight on itthe left leg.In the ED, vitals stable, labs unremarkable except INR 2.5. Left hip x-ray showed a displaced femoral neck fracture.     Consultants:   Orthopedics  Procedures: None  Antimicrobials:   None   Subjective: Overnight patient spiked a fever of 101.5 with tachycardia with heart rate of 145.  Patient had denied any shortness of breath or chest pain. This a.m. she has no complaints.  Eating breakfast.  Denies shortness of breath or chest pain.  Objective: Vitals:   02/15/19 0337 02/15/19 0423 02/15/19 0542 02/15/19 0739  BP: 107/60 (!) 130/58 (!) 106/59 117/63  Pulse: 90 93 92 84  Resp: 16 16 16 17   Temp: 98.3 F (36.8 C) 97.7 F (36.5 C) (!) 97.3 F (36.3 C) 97.7 F (36.5 C)  TempSrc: Oral Oral Oral Oral  SpO2: 96% 100% 99% 97%  Weight:      Height:        Intake/Output Summary (Last 24 hours) at 02/15/2019 1121 Last data filed at 02/15/2019 0500 Gross per 24 hour  Intake 567.37 ml  Output 500 ml  Net 67.37 ml   Filed Weights   02/12/19 0840 02/13/19 0500 02/14/19 0500  Weight: 51.2 kg 56.4 kg 55.4 kg    Examination:  General exam: Appears calm and comfortable, NAD Respiratory system: minimal scattered rales with decrease bs at bases Cardiovascular system: S1 & S2 heard, regular/irregular, mildly tachy, no JVD, murmurs, rubs, gallops or clicks. Gastrointestinal system: Abdomen is nondistended, soft and nontender. Normal bowel sounds heard.  No rebound/guarding ZD:GLOVF in place  Central nervous system: Alert and oriented. No focal neurological  deficits. Extremities: no edema or cyanosis, SCD in place Skin: warm /dry Psychiatry: Judgement and insight appear normal. Mood & affect appropriate.     Data Reviewed: I have personally reviewed following labs and imaging studies  CBC: Recent Labs  Lab 02/09/19 1653 02/10/19 0348 02/11/19 0634 02/12/19 1630 02/13/19 0612 02/14/19 0714 02/15/19 0022  WBC 10.8*   < > 10.2 10.3 7.7 8.5 9.1  NEUTROABS 9.1*  --   --   --   --   --  6.9  HGB 14.0   < > 13.2 12.5 10.8* 10.6* 9.4*  HCT 42.3   < > 38.9 37.8 33.1* 32.5* 28.8*  MCV 94.8   < > 93.3 94.7 97.6 98.2 97.0  PLT 267   < > 208 172 169 185 195   < > = values in this interval not displayed.   Basic Metabolic Panel: Recent Labs  Lab 02/10/19 0348 02/10/19 0348 02/11/19 6433 02/11/19 0634 02/12/19 0323 02/12/19 1630 02/13/19 0612 02/14/19 0714 02/15/19 0022  NA 138   < > 140   < > 137 137 140 137 132*  K 3.9   < > 3.7   < > 3.8 4.3 4.5 4.1 4.0  CL 98   < > 100   < > 101 104 107 105 102  CO2 27   < > 27   < > 25 18* 24 24 20*  GLUCOSE 122*   < > 111*   < > 106* 100*  113* 116* 134*  BUN 26*   < > 38*   < > 29* 26* 29* 21 18  CREATININE 1.22*   < > 1.67*   < > 1.39* 1.11* 1.22* 1.21* 0.97  CALCIUM 9.1   < > 9.3   < > 8.8* 8.3* 7.7* 8.0* 7.7*  MG 2.5*  --  2.5*  --   --   --   --   --   --    < > = values in this interval not displayed.   GFR: Estimated Creatinine Clearance: 33.5 mL/min (by C-G formula based on SCr of 0.97 mg/dL). Liver Function Tests: No results for input(s): AST, ALT, ALKPHOS, BILITOT, PROT, ALBUMIN in the last 168 hours. No results for input(s): LIPASE, AMYLASE in the last 168 hours. No results for input(s): AMMONIA in the last 168 hours. Coagulation Profile: Recent Labs  Lab 02/11/19 0634 02/12/19 0323 02/13/19 0612 02/14/19 0714 02/15/19 0319  INR 2.2* 1.3* 1.3* 1.4* 1.8*   Cardiac Enzymes: No results for input(s): CKTOTAL, CKMB, CKMBINDEX, TROPONINI in the last 168 hours. BNP (last 3  results) No results for input(s): PROBNP in the last 8760 hours. HbA1C: No results for input(s): HGBA1C in the last 72 hours. CBG: No results for input(s): GLUCAP in the last 168 hours. Lipid Profile: No results for input(s): CHOL, HDL, LDLCALC, TRIG, CHOLHDL, LDLDIRECT in the last 72 hours. Thyroid Function Tests: No results for input(s): TSH, T4TOTAL, FREET4, T3FREE, THYROIDAB in the last 72 hours. Anemia Panel: No results for input(s): VITAMINB12, FOLATE, FERRITIN, TIBC, IRON, RETICCTPCT in the last 72 hours. Sepsis Labs: Recent Labs  Lab 02/15/19 0022 02/15/19 0319  PROCALCITON 0.34 0.37  LATICACIDVEN 2.3* 1.5    Recent Results (from the past 240 hour(s))  Respiratory Panel by RT PCR (Flu A&B, Covid) - Nasopharyngeal Swab     Status: None   Collection Time: 02/09/19  5:59 PM   Specimen: Nasopharyngeal Swab  Result Value Ref Range Status   SARS Coronavirus 2 by RT PCR NEGATIVE NEGATIVE Final    Comment: (NOTE) SARS-CoV-2 target nucleic acids are NOT DETECTED. The SARS-CoV-2 RNA is generally detectable in upper respiratoy specimens during the acute phase of infection. The lowest concentration of SARS-CoV-2 viral copies this assay can detect is 131 copies/mL. A negative result does not preclude SARS-Cov-2 infection and should not be used as the sole basis for treatment or other patient management decisions. A negative result may occur with  improper specimen collection/handling, submission of specimen other than nasopharyngeal swab, presence of viral mutation(s) within the areas targeted by this assay, and inadequate number of viral copies (<131 copies/mL). A negative result must be combined with clinical observations, patient history, and epidemiological information. The expected result is Negative. Fact Sheet for Patients:  PinkCheek.be Fact Sheet for Healthcare Providers:  GravelBags.it This test is not yet ap  proved or cleared by the [place] FDA and  has been authorized for detection and/or diagnosis of SARS-CoV-2 by FDA under an Emergency Use Authorization (EUA). This EUA will remain  in effect (meaning this test can be used) for the duration of the COVID-19 declaration under Section 564(b)(1) of the Act, 21 U.S.C. section 360bbb-3(b)(1), unless the authorization is terminated or revoked sooner.    Influenza A by PCR NEGATIVE NEGATIVE Final   Influenza B by PCR NEGATIVE NEGATIVE Final    Comment: (NOTE) The Xpert Xpress SARS-CoV-2/FLU/RSV assay is intended as an aid in  the diagnosis of influenza from Nasopharyngeal swab specimens  and  should not be used as a sole basis for treatment. Nasal washings and  aspirates are unacceptable for Xpert Xpress SARS-CoV-2/FLU/RSV  testing. Fact Sheet for Patients: PinkCheek.be Fact Sheet for Healthcare Providers: GravelBags.it This test is not yet approved or cleared by the [place] FDA and  has been authorized for detection and/or diagnosis of SARS-CoV-2 by  FDA under an Emergency Use Authorization (EUA). This EUA will remain  in effect (meaning this test can be used) for the duration of the  Covid-19 declaration under Section 564(b)(1) of the Act, 21  U.S.C. section 360bbb-3(b)(1), unless the authorization is  terminated or revoked. Performed at [facility], [address], [place], [place] [address]   Surgical PCR screen     Status: None   Collection Time: 02/12/19  3:01 AM   Specimen: Nasal Mucosa; Nasal Swab  Result Value Ref Range Status   MRSA, PCR NEGATIVE NEGATIVE Final   Staphylococcus aureus NEGATIVE NEGATIVE Final    Comment: (NOTE) The Xpert SA Assay (FDA approved for NASAL specimens in patients 34 years of age and older), is one component of a comprehensive surveillance program. It is not intended to diagnose infection nor to guide or monitor  treatment. Performed at [facility], [address] [place], [place] [address]   CULTURE, BLOOD (ROUTINE X 2) w Reflex to ID Panel     Status: None (Preliminary result)   Collection Time: 02/15/19 12:22 AM   Specimen: BLOOD  Result Value Ref Range Status   Specimen Description BLOOD RIGHT FOREARM  Final   Special Requests   Final    BOTTLES DRAWN AEROBIC AND ANAEROBIC Blood Culture adequate volume   Culture   Final    NO GROWTH < 12 HOURS Performed at [facility], [address], [place], [place] [address]    Report Status PENDING  Incomplete  CULTURE, BLOOD (ROUTINE X 2) w Reflex to ID Panel     Status: None (Preliminary result)   Collection Time: 02/15/19 12:22 AM   Specimen: BLOOD  Result Value Ref Range Status   Specimen Description BLOOD RIGHT HAND  Final   Special Requests   Final    BOTTLES DRAWN AEROBIC AND ANAEROBIC Blood Culture adequate volume   Culture   Final    NO GROWTH < 12 HOURS Performed at [facility], [address], [place], [place] [address]    Report Status PENDING  Incomplete         Radiology Studies: DG Chest 1 View  Result Date: 02/15/2019 CLINICAL DATA:  Shortness of breath EXAM: CHEST  1 VIEW COMPARISON:  02/09/2019 FINDINGS: The heart size is enlarged. There are are prominent interstitial lung markings with new bilateral pleural effusions that are small to moderate size, right worse than left. There is probable atelectasis at the lung bases. There is no pneumothorax. Aortic calcifications are again noted. There is no definite acute osseous abnormality. Again noted is a severe compression fracture of the L1 vertebral body. IMPRESSION: Findings most consistent with congestive heart failure with pulmonary edema and bilateral pleural effusions, right greater than left. Electronically Signed   By: Constance Holster M.D.   On: 02/15/2019 00:12        Scheduled Meds: . Chlorhexidine Gluconate Cloth  6 each Topical  Daily  . docusate sodium  100 mg Oral BID  . doxepin  10 mg Oral Daily  . enoxaparin (LOVENOX) injection  50 mg Subcutaneous Q12H  . famotidine  20  mg Oral Daily  . feeding supplement (ENSURE ENLIVE)  237 mL Oral BID BM  . ferrous [id number] C-folic acid  1 capsule Oral BID  . levothyroxine  50 mcg Oral QAC breakfast  . magnesium oxide  400 mg Oral Daily  . metoprolol succinate  12.5 mg Oral BID  . senna  1 tablet Oral BID  . simvastatin  20 mg Oral Daily  . warfarin  4 mg Oral ONCE-1800  . Warfarin - Pharmacist Dosing Inpatient   Does not apply q1800   Continuous Infusions: . lactated ringers 50 mL/hr at 02/15/19 0500    Assessment & Plan:   Principal Problem:   Hip fracture, left ([place]) Active Problems:   Atrial fibrillation (HCC)   Arteriosclerosis of coronary artery   Chronic kidney disease (CKD), stage III (moderate)   HLD (hyperlipidemia)   Osteoporosis, post-menopausal   Hypothyroidism   Protein-calorie malnutrition, severe   Fall   Hip fracture, left Displaced left femoral neck fracture secondary to mechanical fall. --Ortho following-s/p left hemihip arthroplasty 02/12/19 --surgery was initially held until INR comes down --> Ortho gave IV vit K already.  Postop: pt was hypotensive, given IVF boluses, urine outpt was good earlier then less . Bladder scan negative.  Was on IV fluids, now hemodynamically stable Monitor I/o. --pain management -mostly requiring Tylenol --bowel regimen --PT-recommend SNF Weightbearing as tolerated Follow-up with [facility] in 2 weeks for staple removal and Steri-Strip application TED hose bilateral lower extremities x6 weeks, okay to remove at nighttime. Continue with Coumadin Tylenol and Norco for pain.  Prescription for Norco in chart.   Fever-likely postop fever. UA negative chest x-ray pulmonary edema, urine culture negative, blood culture pending We will hold off on IV antibiotics. Incentive  spirometer   Atrial fibrillation- rate controlled. On warfarin. --a/c on held for surgery --continue coumadin now --mildly tachy, change toprol xl to toprol 12.5mg  q6hr with parameters, increase to home dose as tolerated since bp stable now. inr goal 2-3, inr today 1.8  Coronary arterydisease- stable, no chest pain Valvular Heart Disease- stable --Place on telemetry since heart rate has been variable at times uncontrolled --continue spironolactone..> hold since with a/cki Volume overloaded today, will ck bnp. Plan: Will give one dose of lasix 20mg  iv x1 D/c ivf  HLD (hyperlipidemia) --continue simvastatin  Acute on Chronic kidney disease (CKD), stage III (moderate)- Stable. Due to hypotension /low vol. Status -Continue to hold Aldactone for now It has improved with IV fluid for hydration-will discontinue since patient is mildly volume overloaded Continue to monitor monitor   Osteoporosis, post-menopausal- with multiple fragility fractures including current left hip, prior right hip and multiple vertebral compression fractures. Does not ppear to be on home medication for this. --recommend initiation of therapy ASAP, follow up with PCP --Continue her home vitamin D vitamin D   Hypothyroidism - continue Synthroid  GERD - continue Protonix   Other - continue Doxepin   DVT prophylaxis:Lovenox,  Code Status: Full Code Family Communication: None at bedside  Disposition Plan:PT has recommended SNF bed pending.  For now need to stabilize her heart rate which at times she is A. fib RVR.  Also mildly volume overloaded only to become more euvolemic prior to discharge.    LOS: 6 days   Time spent:45 min with >50% on coc    Nolberto Hanlon, MD [facility] Pager [phone]  If 7PM-7AM, please contact night-coverage [email or web address] Password [facility] 02/15/2019, 11:21 AM Patient ID: Elizabeth Fields, female   DOB:  Mar 16, 1931, 86 y.o.   MRN: [id number]

## 2019-02-15 NOTE — Progress Notes (Signed)
Patient has not voided, received verbal order from on call provider Rufina Falco NP for in/out cath, 500cc's output. Metoprolol 5mg  IV order received as well. Incision site hot to the touch, red. Elizabeth at bedside to assess patient. Telemetry monitor on patient. VS 107/57, HR 99, oral temp 99.4, 98 02 on 1L, resp 16. Lactic at 2.3. MEWS at 0. Will continue to monitor.

## 2019-02-15 NOTE — Progress Notes (Signed)
Patient's MEWS score at 5, oral temp 101.5, HR 130. States she has 9/10 pain in L leg. Muscle relaxer and tylenol prn given, notified Solon Palm NP. See new orders. Patient in no acute distress.

## 2019-02-15 NOTE — Consult Note (Signed)
[place] for Warfarin/Lovenox Indication: atrial fibrillation/stroke prevention  Allergies  Allergen Reactions  . Fosamax [Alendronate Sodium] Nausea And Vomiting  . Other Rash  . Penicillins Nausea And Vomiting and Palpitations  . Tramadol Nausea And Vomiting and Palpitations    Patient Measurements: Height: 5\' 2"  (157.5 cm) Weight: 122 lb 2.2 oz (55.4 kg) IBW/kg (Calculated) : 50.1  Vital Signs: Temp: 97.7 F (36.5 C) (02/07 0739) Temp Source: Oral (02/07 0739) BP: 117/63 (02/07 0739) Pulse Rate: 84 (02/07 0739)  Labs: Recent Labs    02/13/19 0612 02/13/19 0612 02/14/19 0714 02/15/19 0022 02/15/19 0319  HGB 10.8*   < > 10.6* 9.4*  --   HCT 33.1*  --  32.5* 28.8*  --   PLT 169  --  185 195  --   LABPROT 16.5*  --  17.2*  --  20.6*  INR 1.3*  --  1.4*  --  1.8*  CREATININE 1.22*  --  1.21* 0.97  --    < > = values in this interval not displayed.    Estimated Creatinine Clearance: 33.5 mL/min (by C-G formula based on SCr of 0.97 mg/dL).  Medications:  Pt home dose of Warfarin is 4mg  daily.  Date Time INR Response  02/01  1653  2.5 ---- 02/02 0348 2.8  Vit K 1mg  @ 1439 02/03  0634  2.2 Vit K 5mg  @ 1749 02/04  0323 1.3 Surgery, warfarin 6mg  02/05   0612    1.3 Warfarin 6 mg  02/06 0714 1.4 Warfarin 6 mg 02/07 0319 1.8   Assessment: Pharmacy has been consulted to initiate and monitor Warfarin therapy for a fib.  Will likely take a few days to achieve therapeutic INR, even with aggressive Warfarin dosing. On enoxaparin bridge.   CrCl = 29.3 ml/min  Goal of Therapy:  INR 2-3 Monitor platelets by anticoagulation protocol: Yes   Plan:  2/7 INR subtherapeutic, now trending up. Will continue home warfarin 4 mg tonight. Enoxaparin 50 mg q24h to q12h for improvement in renal function CrCl > 30 ml/min. Hgb with slow trend down, continue to monitor. Ideally, patient to continue enoxaparin bridge for a minimum of 5 days AND  until INR therapeutic x 2.  Dorena Bodo, PharmD 02/15/2019 9:51 AM

## 2019-02-15 NOTE — Progress Notes (Signed)
   Subjective: 3 Days Post-Op Procedure(s) (LRB): ANTERIOR APPROACH HEMI HIP ARTHROPLASTY (Left) Patient reports pain as mild.   Patient is well, and has had no acute complaints or problems Denies any CP, SOB, ABD pain. Patient noted to have temp of 101.5 with tachycardia last night.  Restarted on metoprolol.  Has had a negative work-up at this time looking for sources of infection. We will continue therapy today.   Objective: Vital signs in last 24 hours: Temp:  [97.3 F (36.3 C)-101.5 F (38.6 C)] 97.7 F (36.5 C) (02/07 0739) Pulse Rate:  [84-134] 84 (02/07 0739) Resp:  [16-17] 17 (02/07 0739) BP: (105-130)/(52-71) 117/63 (02/07 0739) SpO2:  [96 %-100 %] 97 % (02/07 0739)  Intake/Output from previous day: 02/06 0701 - 02/07 0700 In: 567.4 [I.V.:567.4] Out: 500 [Urine:500] Intake/Output this shift: No intake/output data recorded.  Recent Labs    02/12/19 1630 02/13/19 0612 02/14/19 0714 02/15/19 0022  HGB 12.5 10.8* 10.6* 9.4*   Recent Labs    02/14/19 0714 02/15/19 0022  WBC 8.5 9.1  RBC 3.31* 2.97*  HCT 32.5* 28.8*  PLT 185 195   Recent Labs    02/14/19 0714 02/15/19 0022  NA 137 132*  K 4.1 4.0  CL 105 102  CO2 24 20*  BUN 21 18  CREATININE 1.21* 0.97  GLUCOSE 116* 134*  CALCIUM 8.0* 7.7*   Recent Labs    02/14/19 0714 02/15/19 0319  INR 1.4* 1.8*    EXAM General - Patient is Alert, Appropriate and Oriented Extremity - Neurovascular intact Sensation intact distally Intact pulses distally Dorsiflexion/Plantar flexion intact No cellulitis present Compartment soft  Negative Homans' sign bilaterally Dressing - dressing C/D/I and scant drainage,. Motor Function - intact, moving foot and toes well on exam.   Past Medical History:  Diagnosis Date  . Allergy   . Atrial fibrillation ([place])   . CHF (congestive heart failure) ([place])   . History of squamous cell carcinoma   . Hyperlipidemia   . Hypertension   . Renal insufficiency   .  Thyroid disease     Assessment/Plan:   3 Days Post-Op Procedure(s) (LRB): ANTERIOR APPROACH HEMI HIP ARTHROPLASTY (Left) Principal Problem:   Hip fracture, left (HCC) Active Problems:   Atrial fibrillation (HCC)   Arteriosclerosis of coronary artery   Chronic kidney disease (CKD), stage III (moderate)   HLD (hyperlipidemia)   Osteoporosis, post-menopausal   Hypothyroidism   Protein-calorie malnutrition, severe   Fall  Estimated body mass index is 22.34 kg/m as calculated from the following:   Height as of this encounter: 5\' 2"  (1.575 m).   Weight as of this encounter: 55.4 kg. Advance diet Up with therapy, weightbearing as tolerated Acute post op blood loss anemia -hemoglobin 9.4.  Stable.  Start iron supplement Vital signs stable, fever and tachycardia resolved. Pain controlled Incision site appears well.  No signs of infection.  No signs of DVT bilaterally Care management to assist with discharge to skilled nursing facility Monday.   Follow-up with [facility] orthopedics in 2 weeks for staple removal and Steri-Strip application TED hose bilateral lower extremities x6 weeks, okay to remove at nighttime. Continue with Coumadin Tylenol and Norco for pain.  Prescription for Norco in chart.    DVT Prophylaxis - Coumadin, TED hose and SCDs Weight-Bearing as tolerated to left leg   T. Rachelle Hora, PA-C [place] 02/15/2019, 8:49 AM

## 2019-02-16 LAB — URINE CULTURE: Culture: NO GROWTH

## 2019-02-16 LAB — BASIC METABOLIC PANEL
Anion gap: 8 (ref 5–15)
BUN: 21 mg/dL (ref 8–23)
CO2: 22 mmol/L (ref 22–32)
Calcium: 7.8 mg/dL — ABNORMAL LOW (ref 8.9–10.3)
Chloride: 102 mmol/L (ref 98–111)
Creatinine, Ser: 1.04 mg/dL — ABNORMAL HIGH (ref 0.44–1.00)
GFR calc Af Amer: 57 mL/min — ABNORMAL LOW (ref >60)
GFR calc non Af Amer: 49 mL/min — ABNORMAL LOW (ref >60)
Glucose, Bld: 116 mg/dL — ABNORMAL HIGH (ref 70–99)
Potassium: 4.2 mmol/L (ref 3.5–5.1)
Sodium: 132 mmol/L — ABNORMAL LOW (ref 135–145)

## 2019-02-16 LAB — PROTIME-INR
INR: 2.7 — ABNORMAL HIGH (ref 0.8–1.2)
Prothrombin Time: 28.4 seconds — ABNORMAL HIGH (ref 11.4–15.2)

## 2019-02-16 LAB — SURGICAL PATHOLOGY

## 2019-02-16 LAB — PROCALCITONIN: Procalcitonin: 0.39 ng/mL

## 2019-02-16 MED ORDER — FUROSEMIDE 20 MG PO TABS
20.0000 mg | ORAL_TABLET | Freq: Every day | ORAL | Status: DC
  Administered 2019-02-16 – 2019-02-18 (×3): 20 mg via ORAL
  Filled 2019-02-16 (×3): qty 1

## 2019-02-16 MED ORDER — WARFARIN SODIUM 4 MG PO TABS
4.0000 mg | ORAL_TABLET | Freq: Once | ORAL | Status: AC
  Administered 2019-02-16: 4 mg via ORAL
  Filled 2019-02-16: qty 1

## 2019-02-16 MED ORDER — ENOXAPARIN SODIUM 60 MG/0.6ML ~~LOC~~ SOLN
50.0000 mg | SUBCUTANEOUS | Status: DC
  Filled 2019-02-16: qty 0.6

## 2019-02-16 NOTE — Progress Notes (Signed)
PROGRESS NOTE    Elizabeth Fields  JFH:[id number] DOB: 04-25-1931 DOA: 02/09/2019 PCP: Baxter Hire, MD    Brief Narrative:  86 y.o.femalewith medical history ofA-Fibon warfarin, HTN, HLD, renal insufficiency, hypothyroidism, aortic and mitral valve disease, presented to the ED on 2/1 withleft hip pain after amechanicalfallat home after which she wasnot able to move that hip or put weight on itthe left leg.In the ED, vitals stable, labs unremarkable except INR 2.5. Left hip x-ray showed a displaced femoral neck fracture.     Consultants:   Orthopedics  Procedures: None  Antimicrobials:   None   Subjective: No overnight issues. No complaints. States feeling well. Denies sob, tachycardia, or cp. Pain controlled.   Objective: Vitals:   02/15/19 2354 02/16/19 0515 02/16/19 0835 02/16/19 1058  BP: 102/68 111/62 106/64 106/64  Pulse: (!) 108 (!) 109 90 97  Resp: 17 16 16 18   Temp: 98.2 F (36.8 C)  97.9 F (36.6 C)   TempSrc: Oral  Oral   SpO2: 100% 98% 99% 100%  Weight:      Height:        Intake/Output Summary (Last 24 hours) at 02/16/2019 1235 Last data filed at 02/16/2019 0900 Gross per 24 hour  Intake 0 ml  Output 650 ml  Net -650 ml   Filed Weights   02/12/19 0840 02/13/19 0500 02/14/19 0500  Weight: 51.2 kg 56.4 kg 55.4 kg    Examination:  General exam: Appears calm and comfortable, NAD Respiratory system: more cta, no wheezing or rales. Decrease bs at bases.  Cardiovascular system: S1 & S2 heard, regular/irregular,  no JVD, murmurs, rubs, gallops or clicks. Gastrointestinal system: Abdomen is nondistended, soft and nontender. Normal bowel sounds heard.   LH:TDSKA in place  Central nervous system: Alert and oriented. No focal neurological deficits. Extremities: no edema or cyanosis, SCD in place Skin: warm /dry Psychiatry: Judgement and insight appear normal. Mood & affect appropriate.     Data Reviewed: I have personally reviewed  following labs and imaging studies  CBC: Recent Labs  Lab 02/09/19 1653 02/10/19 0348 02/11/19 0634 02/12/19 1630 02/13/19 0612 02/14/19 0714 02/15/19 0022  WBC 10.8*   < > 10.2 10.3 7.7 8.5 9.1  NEUTROABS 9.1*  --   --   --   --   --  6.9  HGB 14.0   < > 13.2 12.5 10.8* 10.6* 9.4*  HCT 42.3   < > 38.9 37.8 33.1* 32.5* 28.8*  MCV 94.8   < > 93.3 94.7 97.6 98.2 97.0  PLT 267   < > 208 172 169 185 195   < > = values in this interval not displayed.   Basic Metabolic Panel: Recent Labs  Lab 02/10/19 0348 02/10/19 0348 02/11/19 7681 02/12/19 0323 02/12/19 1630 02/13/19 0612 02/14/19 0714 02/15/19 0022 02/16/19 0113  NA 138   < > 140   < > 137 140 137 132* 132*  K 3.9   < > 3.7   < > 4.3 4.5 4.1 4.0 4.2  CL 98   < > 100   < > 104 107 105 102 102  CO2 27   < > 27   < > 18* 24 24 20* 22  GLUCOSE 122*   < > 111*   < > 100* 113* 116* 134* 116*  BUN 26*   < > 38*   < > 26* 29* 21 18 21   CREATININE 1.22*   < > 1.67*   < >  1.11* 1.22* 1.21* 0.97 1.04*  CALCIUM 9.1   < > 9.3   < > 8.3* 7.7* 8.0* 7.7* 7.8*  MG 2.5*  --  2.5*  --   --   --   --   --   --    < > = values in this interval not displayed.   GFR: Estimated Creatinine Clearance: 31.3 mL/min (A) (by C-G formula based on SCr of 1.04 mg/dL (H)). Liver Function Tests: No results for input(s): AST, ALT, ALKPHOS, BILITOT, PROT, ALBUMIN in the last 168 hours. No results for input(s): LIPASE, AMYLASE in the last 168 hours. No results for input(s): AMMONIA in the last 168 hours. Coagulation Profile: Recent Labs  Lab 02/12/19 0323 02/13/19 0612 02/14/19 0714 02/15/19 0319 02/16/19 0113  INR 1.3* 1.3* 1.4* 1.8* 2.7*   Cardiac Enzymes: No results for input(s): CKTOTAL, CKMB, CKMBINDEX, TROPONINI in the last 168 hours. BNP (last 3 results) No results for input(s): PROBNP in the last 8760 hours. HbA1C: No results for input(s): HGBA1C in the last 72 hours. CBG: No results for input(s): GLUCAP in the last 168  hours. Lipid Profile: No results for input(s): CHOL, HDL, LDLCALC, TRIG, CHOLHDL, LDLDIRECT in the last 72 hours. Thyroid Function Tests: No results for input(s): TSH, T4TOTAL, FREET4, T3FREE, THYROIDAB in the last 72 hours. Anemia Panel: No results for input(s): VITAMINB12, FOLATE, FERRITIN, TIBC, IRON, RETICCTPCT in the last 72 hours. Sepsis Labs: Recent Labs  Lab 02/15/19 0022 02/15/19 0319 02/16/19 0113  PROCALCITON 0.34 0.37 0.39  LATICACIDVEN 2.3* 1.5  --     Recent Results (from the past 240 hour(s))  Respiratory Panel by RT PCR (Flu A&B, Covid) - Nasopharyngeal Swab     Status: None   Collection Time: 02/09/19  5:59 PM   Specimen: Nasopharyngeal Swab  Result Value Ref Range Status   SARS Coronavirus 2 by RT PCR NEGATIVE NEGATIVE Final    Comment: (NOTE) SARS-CoV-2 target nucleic acids are NOT DETECTED. The SARS-CoV-2 RNA is generally detectable in upper respiratoy specimens during the acute phase of infection. The lowest concentration of SARS-CoV-2 viral copies this assay can detect is 131 copies/mL. A negative result does not preclude SARS-Cov-2 infection and should not be used as the sole basis for treatment or other patient management decisions. A negative result may occur with  improper specimen collection/handling, submission of specimen other than nasopharyngeal swab, presence of viral mutation(s) within the areas targeted by this assay, and inadequate number of viral copies (<131 copies/mL). A negative result must be combined with clinical observations, patient history, and epidemiological information. The expected result is Negative. Fact Sheet for Patients:  PinkCheek.be Fact Sheet for Healthcare Providers:  GravelBags.it This test is not yet ap proved or cleared by the [place] FDA and  has been authorized for detection and/or diagnosis of SARS-CoV-2 by FDA under an Emergency Use Authorization  (EUA). This EUA will remain  in effect (meaning this test can be used) for the duration of the COVID-19 declaration under Section 564(b)(1) of the Act, 21 U.S.C. section 360bbb-3(b)(1), unless the authorization is terminated or revoked sooner.    Influenza A by PCR NEGATIVE NEGATIVE Final   Influenza B by PCR NEGATIVE NEGATIVE Final    Comment: (NOTE) The Xpert Xpress SARS-CoV-2/FLU/RSV assay is intended as an aid in  the diagnosis of influenza from Nasopharyngeal swab specimens and  should not be used as a sole basis for treatment. Nasal washings and  aspirates are unacceptable for Xpert Xpress SARS-CoV-2/FLU/RSV  testing. Fact Sheet for Patients: PinkCheek.be Fact Sheet for Healthcare Providers: GravelBags.it This test is not yet approved or cleared by the [place] FDA and  has been authorized for detection and/or diagnosis of SARS-CoV-2 by  FDA under an Emergency Use Authorization (EUA). This EUA will remain  in effect (meaning this test can be used) for the duration of the  Covid-19 declaration under Section 564(b)(1) of the Act, 21  U.S.C. section 360bbb-3(b)(1), unless the authorization is  terminated or revoked. Performed at [facility], [address], [place], [place] [address]   Surgical PCR screen     Status: None   Collection Time: 02/12/19  3:01 AM   Specimen: Nasal Mucosa; Nasal Swab  Result Value Ref Range Status   MRSA, PCR NEGATIVE NEGATIVE Final   Staphylococcus aureus NEGATIVE NEGATIVE Final    Comment: (NOTE) The Xpert SA Assay (FDA approved for NASAL specimens in patients 42 years of age and older), is one component of a comprehensive surveillance program. It is not intended to diagnose infection nor to guide or monitor treatment. Performed at [facility], [address] [place], [place] [address]   CULTURE, BLOOD (ROUTINE X 2) w Reflex to ID Panel     Status: None  (Preliminary result)   Collection Time: 02/15/19 12:22 AM   Specimen: BLOOD  Result Value Ref Range Status   Specimen Description BLOOD RIGHT FOREARM  Final   Special Requests   Final    BOTTLES DRAWN AEROBIC AND ANAEROBIC Blood Culture adequate volume   Culture   Final    NO GROWTH 1 DAY Performed at [facility], [address], [place], [place] [address]    Report Status PENDING  Incomplete  CULTURE, BLOOD (ROUTINE X 2) w Reflex to ID Panel     Status: None (Preliminary result)   Collection Time: 02/15/19 12:22 AM   Specimen: BLOOD  Result Value Ref Range Status   Specimen Description BLOOD RIGHT HAND  Final   Special Requests   Final    BOTTLES DRAWN AEROBIC AND ANAEROBIC Blood Culture adequate volume   Culture   Final    NO GROWTH 1 DAY Performed at [facility], [address], [place], [place] [address]    Report Status PENDING  Incomplete  Urine Culture     Status: None   Collection Time: 02/15/19  1:02 AM   Specimen: Urine, Random  Result Value Ref Range Status   Specimen Description URINE, RANDOM  Final   Special Requests   Final    NONE Performed at [facility], [address] [place], [place] [address]    Culture NO GROWTH  Final   Report Status 02/16/2019 FINAL  Final         Radiology Studies: DG Chest 1 View  Result Date: 02/15/2019 CLINICAL DATA:  Shortness of breath EXAM: CHEST  1 VIEW COMPARISON:  02/09/2019 FINDINGS: The heart size is enlarged. There are are prominent interstitial lung markings with new bilateral pleural effusions that are small to moderate size, right worse than left. There is probable atelectasis at the lung bases. There is no pneumothorax. Aortic calcifications are again noted. There is no definite acute osseous abnormality. Again noted is a severe compression fracture of the L1 vertebral body. IMPRESSION: Findings most consistent with congestive heart failure with pulmonary edema and bilateral pleural  effusions, right greater than left. Electronically Signed   By: Constance Holster M.D.   On: 02/15/2019 00:12  Scheduled Meds: . Chlorhexidine Gluconate Cloth  6 each Topical Daily  . docusate sodium  100 mg Oral BID  . doxepin  10 mg Oral Daily  . famotidine  20 mg Oral Daily  . feeding supplement (ENSURE ENLIVE)  237 mL Oral BID BM  . ferrous [id number] C-folic acid  1 capsule Oral BID  . furosemide  20 mg Oral Daily  . levothyroxine  50 mcg Oral QAC breakfast  . magnesium oxide  400 mg Oral Daily  . metoprolol tartrate  12.5 mg Oral Q6H  . senna  1 tablet Oral BID  . simvastatin  20 mg Oral Daily  . warfarin  4 mg Oral ONCE-1800  . Warfarin - Pharmacist Dosing Inpatient   Does not apply q1800   Continuous Infusions:   Assessment & Plan:   Principal Problem:   Hip fracture, left (HCC) Active Problems:   Atrial fibrillation (HCC)   Arteriosclerosis of coronary artery   Chronic kidney disease (CKD), stage III (moderate)   HLD (hyperlipidemia)   Osteoporosis, post-menopausal   Hypothyroidism   Protein-calorie malnutrition, severe   Fall   Hip fracture, left Displaced left femoral neck fracture secondary to mechanical fall. --Ortho following-s/p left hemihip arthroplasty 02/12/19 --surgery was initially held until INR comes down --> Ortho gave IV vit K already.  Postop: pt was hypotensive, given IVF boluses, urine outpt was good earlier then less . Bladder scan negative.  Was on IV fluids, now hemodynamically stable Monitor I/o. --pain management -mostly requiring Tylenol --bowel regimen --PT-recommend SNF Weightbearing as tolerated Follow-up with [place] orthopedics in 2 weeks for staple removal and Steri-Strip application TED hose bilateral lower extremities x6 weeks, okay to remove at nighttime. Continue with Coumadin Tylenol and Norco for pain.  Prescription for Norco in chart.   Fever-likely postop fever. UA negative chest x-ray pulmonary edema,  urine culture negative, blood culture pending- so far negative.   hold off on IV antibiotics unless have a reason to start if cx come back +. Incentive spirometer   Atrial fibrillation- rate controlled. On warfarin. --a/c on held for surgery --continue coumadin now --mildly tachy, change toprol xl to toprol 12.5mg  q6hr with parameters, increase to home dose as tolerated since bp stable now. inr goal 2-3,, today INR is therapeutic DC Lovenox  Coronary arterydisease- stable, no chest pain Valvular Heart Disease- stable --Place on telemetry since heart rate has been variable at times uncontrolled --continue spironolactone..> hold since with a/cki More euvolemic.  Resume p.o. Lasix. Was given Lasix 20 mg IV x1 and fluids were discontinued   HLD (hyperlipidemia) --continue simvastatin  Acute on Chronic kidney disease (CKD), stage III (moderate)- Stable. Due to hypotension /low vol. Status -Continue to hold Aldactone for now It has improved with IV fluid for hydration-will discontinue since patient is mildly volume overloaded Continue to monitor monitor   Osteoporosis, post-menopausal- with multiple fragility fractures including current left hip, prior right hip and multiple vertebral compression fractures. Does not ppear to be on home medication for this. --recommend initiation of therapy ASAP, follow up with PCP --Continue her home vitamin D vitamin D   Hypothyroidism - continue Synthroid  GERD - continue Protonix   Other - continue Doxepin   DVT prophylaxis:Lovenox,  Code Status: Full Code Family Communication: None at bedside  Disposition Plan:PT has recommended SNF bed pending.  For now need to stabilize her heart rate which at times she is A. fib RVR.  Also mildly volume overloaded only to become more euvolemic prior  to discharge.    LOS: 7 days   Time spent:45 min with >50% on coc    Nolberto Hanlon, MD [facility] Pager [phone]  [phone]  If 7PM-7AM, please contact night-coverage [email or web address] Password [facility] 02/16/2019, 12:35 PM Patient ID: Elizabeth Fields, female   DOB: May 09, 1931, 86 y.o.   MRN: [id number]

## 2019-02-16 NOTE — Progress Notes (Signed)
   Subjective: 4 Days Post-Op Procedure(s) (LRB): ANTERIOR APPROACH HEMI HIP ARTHROPLASTY (Left) Patient reports pain as mild.   Patient is well, and has had no acute complaints or problems Denies any CP, SOB, ABD pain. Afebrile We will continue therapy today.   Objective: Vital signs in last 24 hours: Temp:  [97.7 F (36.5 C)-99.4 F (37.4 C)] 98.2 F (36.8 C) (02/07 2354) Pulse Rate:  [89-115] 109 (02/08 0515) Resp:  [16-17] 16 (02/08 0515) BP: (102-112)/(52-68) 111/62 (02/08 0515) SpO2:  [97 %-100 %] 98 % (02/08 0515)  Intake/Output from previous day: 02/07 0701 - 02/08 0700 In: -  Out: 650 [Urine:650] Intake/Output this shift: No intake/output data recorded.  Recent Labs    02/14/19 0714 02/15/19 0022  HGB 10.6* 9.4*   Recent Labs    02/14/19 0714 02/15/19 0022  WBC 8.5 9.1  RBC 3.31* 2.97*  HCT 32.5* 28.8*  PLT 185 195   Recent Labs    02/15/19 0022 02/16/19 0113  NA 132* 132*  K 4.0 4.2  CL 102 102  CO2 20* 22  BUN 18 21  CREATININE 0.97 1.04*  GLUCOSE 134* 116*  CALCIUM 7.7* 7.8*   Recent Labs    02/15/19 0319 02/16/19 0113  INR 1.8* 2.7*    EXAM General - Patient is Alert, Appropriate and Oriented Extremity - Neurovascular intact Sensation intact distally Intact pulses distally Dorsiflexion/Plantar flexion intact No cellulitis present Compartment soft  Negative Homans' sign bilaterally Dressing - dressing C/D/I and scant drainage,. Motor Function - intact, moving foot and toes well on exam.   Past Medical History:  Diagnosis Date  . Allergy   . Atrial fibrillation ([place])   . CHF (congestive heart failure) ([place])   . History of squamous cell carcinoma   . Hyperlipidemia   . Hypertension   . Renal insufficiency   . Thyroid disease     Assessment/Plan:   4 Days Post-Op Procedure(s) (LRB): ANTERIOR APPROACH HEMI HIP ARTHROPLASTY (Left) Principal Problem:   Hip fracture, left (HCC) Active Problems:   Atrial fibrillation  (HCC)   Arteriosclerosis of coronary artery   Chronic kidney disease (CKD), stage III (moderate)   HLD (hyperlipidemia)   Osteoporosis, post-menopausal   Hypothyroidism   Protein-calorie malnutrition, severe   Fall  Estimated body mass index is 22.34 kg/m as calculated from the following:   Height as of this encounter: 5\' 2"  (1.575 m).   Weight as of this encounter: 55.4 kg. Advance diet Up with therapy, weightbearing as tolerated Acute post op blood loss anemia -hemoglobin 9.4.  Stable.  Start iron supplement Vital signs stable Pain controlled Incision site appears well.  No signs of infection.  No signs of DVT bilaterally Care management to assist with discharge to skilled nursing facility Monday.   Follow-up with [facility] orthopedics in 2 weeks for staple removal and Steri-Strip application TED hose bilateral lower extremities x6 weeks, okay to remove at nighttime. Continue with Coumadin Tylenol and Norco for pain.  Prescription for Norco in chart.    DVT Prophylaxis - Coumadin, TED hose and SCDs Weight-Bearing as tolerated to left leg   T. Rachelle Hora, PA-C [place] 02/16/2019, 7:57 AM

## 2019-02-16 NOTE — Progress Notes (Addendum)
Physical Therapy Treatment Patient Details Name: Elizabeth Fields MRN: [id number] DOB: [birth year] Today's Date: 02/16/2019    History of Present Illness Pt is an 86 y.o. female presenting to hospital 02/09/19 s/p fall with L hip pain; imaging showing displaced L femoral neck fx with resultant varus deformity; surgery delayed d/t elevated INR.  Pt s/p L anterior approach hemi-hip arthroplasty 02/12/19.  PMH includes T12, L1, and L4 compression fx's, a-fib, CHF, htn, CKD, Bergmann syndrome, h/o R hip fx    PT Comments    Pt 97% on 2 lpm.  No O2 prior to admit.  OK from RN to try session without O2 and she was able to remains >94% throughout.  To EOB with min a x 1 and increased time and use of rails.  Once sitting, min guard for safety.  She is able to stand with mod a x 1 with heavy R lean and walk 20' with mod a x 1 to chair.  Sits due to fatigue.  Husband in room and stated he will take pt home vs SNF.  Husband given time to walk with wife to feel assistance level needed.  She is able to stand with min a x 2 and walk 10' forward with min a x 2 with somewhat improved gait this attempt but remains increased risk for fall.  Taken to stairs for stair training with min a x 2 with 1 rail Left.  Verbal cues for sequencing up/down.  To room to bathroom with min a x 1 to transfer and then walk 5' out to recliner.  HR up to 130's with activity but O2 stable in mid 90's.  Discussed at length discharge plan with husband.  He stated he will take her home over concerns regarding SNF placement and Covid/care.  While she remains appropriate for SNF, he stated he has a friend who has helped the family out in the past.  Encouraged 24 hours outside assist for care.  While husband is very attentive and willing to help, he appears to have some balance, mobility issues and falls in the past.  Pt will need a pediatric RW, wheelchair, 3-in-1 commode, gait belt and a hospital bed will be helpful.  Also recommend 24 hours outside assist  until mobility improves. Discussed with SWS.  HHPT.    Follow Up Recommendations  SNF;Other (comment)     Equipment Recommendations  Rolling walker with 5" wheels;3in1 (PT);Wheelchair (measurements PT);Wheelchair cushion (measurements PT);Hospital bed   Pediatric size RW   Recommendations for Other Services       Precautions / Restrictions Precautions Precautions: Anterior Hip Restrictions Weight Bearing Restrictions: Yes LLE Weight Bearing: Weight bearing as tolerated    Mobility  Bed Mobility Overal bed mobility: Needs Assistance Bed Mobility: Supine to Sit Rolling: Min assist         General bed mobility comments: vc for sequencing  Transfers Overall transfer level: Needs assistance Equipment used: Rolling walker (2 wheeled) Transfers: Sit to/from Stand Sit to Stand: Min assist;Mod assist Stand pivot transfers: Min assist          Ambulation/Gait Ambulation/Gait assistance: Min assist;Mod assist Gait Distance (Feet): 20 Feet Assistive device: Rolling walker (2 wheeled) Gait Pattern/deviations: Step-to pattern Gait velocity: decreased   General Gait Details: HR up to 130's with gait.  distance limited due to HR and fatigue   Stairs Stairs: Yes Stairs assistance: Min assist;+2 safety/equipment Stair Management: One rail Left;Sideways Number of Stairs: 2 General stair comments: able to up 2/down 2  Wheelchair Mobility    Modified Rankin (Stroke Patients Only)       Balance Overall balance assessment: Needs assistance Sitting-balance support: Single extremity supported;Feet supported Sitting balance-[name]: Fair     Standing balance support: Bilateral upper extremity supported Standing balance-[name]: Poor Standing balance comment: heavy reliance on walker, right lean initially requies mod a x 1 but inproved to min assist with time.                            Cognition Arousal/Alertness: Awake/alert Behavior During  Therapy: WFL for tasks assessed/performed                                   General Comments: Pt HOH (even with hearing aides in) and requiring extra time and cueing at times      Exercises Other Exercises Other Exercises: to bathroom for BM and void    General Comments        Pertinent Vitals/Pain Pain Assessment: 0-10 Pain Score: 5  Pain Location: l hip Pain Descriptors / Indicators: Aching Pain Intervention(s): Limited activity within patient's tolerance;Monitored during session    Home Living                      Prior Function            PT Goals (current goals can now be found in the care plan section) Progress towards PT goals: Progressing toward goals    Frequency    BID      PT Plan Current plan remains appropriate;Other (comment)    Co-evaluation              AM-PAC PT "6 Clicks" Mobility   Outcome Measure  Help needed turning from your back to your side while in a flat bed without using bedrails?: A Lot Help needed moving from lying on your back to sitting on the side of a flat bed without using bedrails?: A Lot Help needed moving to and from a bed to a chair (including a wheelchair)?: A Lot Help needed standing up from a chair using your arms (e.g., wheelchair or bedside chair)?: A Lot Help needed to walk in hospital room?: A Lot Help needed climbing 3-5 steps with a railing? : A Lot 6 Click Score: 12    End of Session Equipment Utilized During Treatment: Gait belt Activity Tolerance: Patient tolerated treatment well;Patient limited by fatigue Patient left: in chair;with call bell/phone within reach;with chair alarm set;with family/visitor present Nurse Communication: Other (comment)(O2 sats and BM) Pain - Right/Left: Left Pain - part of body: Hip     Time: 8850-2774 PT Time Calculation (min) (ACUTE ONLY): 46 min  Charges:  $Gait Training: 23-37 mins $Therapeutic Activity: 8-22 mins $ PT Supplies: 1  Supply                     Chesley Noon, PTA 02/16/19, 12:50 PM

## 2019-02-16 NOTE — TOC Progression Note (Signed)
Transition of Care [facility]) - Progression Note    Patient Details  Name: Elizabeth Fields MRN: [id number] Date of Birth: January 25, 1931  Transition of Care [facility]) CM/SW Contact  Candie Chroman, LCSW Phone Number: 02/16/2019, 2:53 PM  Clinical Narrative:  Per PT, patient and husband want to return home rather than going to SNF. CSW met with patient and her husband to discuss. No home health agency preference. [facility] unable to take. [facility] is checking to see if they can meet her needs. Patient's home address is [address] [place], [place] [address]. Patient has a walker and cane at home. She is agreeable to a wheelchair and bedside commode. Patient does not feel need for hospital bed.    Expected Discharge Plan: [place] Barriers to Discharge: Continued Medical Work up  Expected Discharge Plan and Services Expected Discharge Plan: [facility] In-house Referral: Clinical Social Work     Living arrangements for the past 2 months: Single Family Home                                       Social Determinants of Health (SDOH) Interventions    Readmission Risk Interventions No flowsheet data found.

## 2019-02-16 NOTE — Consult Note (Signed)
[facility] for Warfarin/Lovenox Indication: atrial fibrillation/stroke prevention  Allergies  Allergen Reactions  . Fosamax [Alendronate Sodium] Nausea And Vomiting  . Other Rash  . Penicillins Nausea And Vomiting and Palpitations  . Tramadol Nausea And Vomiting and Palpitations    Patient Measurements: Height: 5\' 2"  (157.5 cm) Weight: 122 lb 2.2 oz (55.4 kg) IBW/kg (Calculated) : 50.1  Vital Signs: Temp: 98.2 F (36.8 C) (02/07 2354) Temp Source: Oral (02/07 2354) BP: 111/62 (02/08 0515) Pulse Rate: 109 (02/08 0515)  Labs: Recent Labs    02/14/19 0714 02/15/19 0022 02/15/19 0319 02/16/19 0113  HGB 10.6* 9.4*  --   --   HCT 32.5* 28.8*  --   --   PLT 185 195  --   --   LABPROT 17.2*  --  20.6* 28.4*  INR 1.4*  --  1.8* 2.7*  CREATININE 1.21* 0.97  --  1.04*    Estimated Creatinine Clearance: 31.3 mL/min (A) (by C-G formula based on SCr of 1.04 mg/dL (H)).  Medications:  Pt home dose of Warfarin is 4mg  daily.  Date Time INR Response  02/01  1653  2.5 ---- 02/02 0348 2.8  Vit K 1mg  @ 1439 02/03  0634  2.2 Vit K 5mg  @ 1749 02/04  0323 1.3 Surgery, warfarin 6mg  02/05   0612    1.3 Warfarin 6 mg  02/06 0714 1.4 Warfarin 6 mg 02/07 0319 1.8 Warfarin 4 mg 02/08 0113 2.7   Assessment: Pharmacy has been consulted to initiate and monitor Warfarin therapy for a fib.  Will likely take a few days to achieve therapeutic INR, even with aggressive Warfarin dosing. On enoxaparin bridge.   CrCl = 31.3 ml/min  Goal of Therapy:  INR 2-3 Monitor platelets by anticoagulation protocol: Yes   Plan:  2/8 INR now therapeutic x1. Will continue home warfarin 4 mg tonight. Renal function has improved, will transition back to Enoxaparin 50 mg q24h dosing as CrCl > 30 ml/min. Hgb with slow trend down, continue to monitor. Ideally, patient to continue enoxaparin bridge for a minimum of 5 days AND until INR therapeutic x 2.  Pearla Dubonnet,  PharmD Clinical Pharmacist 02/16/2019 8:01 AM

## 2019-02-16 NOTE — Plan of Care (Signed)

## 2019-02-16 NOTE — Progress Notes (Signed)
1PT Cancellation Note  Patient Details Name: Elizabeth Fields MRN: [id number] DOB: Jun 07, 1931   Cancelled Treatment:    Reason Eval/Treat Not Completed: Fatigue/lethargy limiting ability to participate   Pt returned to bed with nursing just upon arrival.  She refused further session at this time due to fatigue.  She did work extra hard this am with an extended session to complete gait, stairs and bathroom so fatigue is understandable.  She declined further therapy this date.   Chesley Noon 02/16/2019, 2:27 PM

## 2019-02-16 NOTE — Evaluation (Addendum)
Clinical/Bedside Swallow Evaluation Patient Details  Name: Elizabeth Fields MRN: [id number] Date of Birth: 06-Aug-1931  Today's Date: 02/16/2019 Time: SLP Start Time (ACUTE ONLY): 34 SLP Stop Time (ACUTE ONLY): 1335 SLP Time Calculation (min) (ACUTE ONLY): 55 min  Past Medical History:  Past Medical History:  Diagnosis Date  . Allergy   . Atrial fibrillation ([place])   . CHF (congestive heart failure) ([place])   . History of squamous cell carcinoma   . Hyperlipidemia   . Hypertension   . Renal insufficiency   . Thyroid disease    Past Surgical History:  Past Surgical History:  Procedure Laterality Date  . ANTERIOR APPROACH HEMI HIP ARTHROPLASTY Left 02/12/2019   Procedure: ANTERIOR APPROACH HEMI HIP ARTHROPLASTY;  Surgeon: Hessie Knows, MD;  Location: [facility] ORS;  Service: Orthopedics;  Laterality: Left;  . CHOLECYSTECTOMY    . HIP SURGERY     HPI:  Pt is a 86 y.o. female with medical history significant of a. Fib, HOH, HTN, HLD, renal insufficiency, hypothyroidism, aortic and mitral valve disease. She reports a recent illness described as colitis with N/V, LLQ pain and fever. CT scan made the diagnosis (?diverticulitis) and she was treated with abx x 2 weeks as outpatient. She presents to the emergency department today because of concern for left hip pain after a fall. The patient was trying to move some drinks on the floor with her foot when she fell over. Landed on her left side.  Pt is post-op ANTERIOR APPROACH HEMI HIP ARTHROPLASTY (Left).  Patient reports her appetite is unchanged from baseline but that she does not eat much at meals. Her husband confirms that she eats very little during the day. Dietician following d/t dx of Severe Malnutrition related to chronic illness.    Assessment / Plan / Recommendation Clinical Impression  Pt appears to present w/ adequate oropharyngeal phase swallow function w/ No overt clinical s/s of aspiration noted during oral intake at evaluation today. Pt  consumed po trials w/ no overt coughing, throat clearing; no wet vocal quality or declinein respiratory status during/post po trials. Oral phase was [facility] for bolus management and oral clearing. Timely A-P transfer noted w/ all bolus consistencies. Pt fed self given min setup support. Oral motor exam WFL w/ No unilateral weakness noted. Pt appears at Reduced risk for aspiration when following general aspiration precautions.  During this evaluation, discussed w/ pt her concern w/ swallowing Pills. Pt described that Pill swallowing was "difficult" but endorsed it was "mostly w/ those larger pills". Discussed strategies to encorporate for ease of swallowing all Pills to include: a liquid form, a dissovable form, WHOLE in Puree(tip end of spoon), Crushed in Puree. SLP stated to both pt and Husband that swallowing Pills w/ liquids was NOT recommended at this time d/t the increased risk for aspiration of the liquid, pill -- NSG had reported coughing when attempting to swallow Pills w/ liquids. Discussed concern for risk for Pulmonary impact/pneumonia to occur if continuous aspiration occurs(ex., event of coughing when swallowing pills w/ liquids). Pt is not fully agreeable to Crushing Pills d/t the taste but agreed to try Whole in a Puree such as ice cream, applesauce. Encouraged pt and Husband to work w/ NSG w/ this task for further practice for use at discharge, home.  Recommend continue w/ current diet(w/ more moist foods, cut meats for ease of intake/eating) w/ thin liquids; general aspiration precautions; Reflux precautions. Recommend tray setup at meals; Pills in Puree for safer swallowing. Dietician is following  pt for Malnutrition - Severe status baseline.  SLP Visit Diagnosis: Dysphagia, unspecified (R13.10)    Aspiration Risk  Risk for inadequate nutrition/hydration(baseline)    Diet Recommendation  regular diet - more of a mech soft consistency w/ meats cut, moist foods; thin liquids. General aspiration  precautions. Tray setup at meals as needed.   Medication Administration: Whole meds with puree(or liquid, dissovable form for ease of swallowing)    Other  Recommendations Recommended Consults: (dietician f/u for support) Oral Care Recommendations: Oral care BID;Oral care before and after PO;Patient independent with oral care Other Recommendations: (n/a)   Follow up Recommendations None      Frequency and Duration (n/a)  (n/a)       Prognosis Prognosis for Safe Diet Advancement: Good Barriers to Reach Goals: Behavior(baseline - malnutrition )      Swallow Study   General Date of Onset: 02/09/19 HPI: Pt is a 86 y.o. female with medical history significant of a. Fib, HOH, HTN, HLD, renal insufficiency, hypothyroidism, aortic and mitral valve disease. She reports a recent illness described as colitis with N/V, LLQ pain and fever. CT scan made the diagnosis (?diverticulitis) and she was treated with abx x 2 weeks as outpatient. She presents to the emergency department today because of concern for left hip pain after a fall. The patient was trying to move some drinks on the floor with her foot when she fell over. Landed on her left side.  Pt is post-op ANTERIOR APPROACH HEMI HIP ARTHROPLASTY (Left).  Patient reports her appetite is unchanged from baseline but that she does not eat much at meals. Her husband confirms that she eats very little during the day. Dietician following d/t dx of Severe Malnutrition related to chronic illness.  Type of Study: Bedside Swallow Evaluation Previous Swallow Assessment: none Diet Prior to this Study: Regular;Thin liquids Temperature Spikes Noted: No(wbc 9.1) Respiratory Status: Nasal cannula(1L) History of Recent Intubation: No Behavior/Cognition: Alert;Cooperative;Pleasant mood;Distractible;Requires cueing(min) Oral Cavity Assessment: Within Functional Limits Oral Care Completed by SLP: Recent completion by staff Oral Cavity - Dentition: Adequate  natural dentition Vision: Functional for self-feeding Self-Feeding Abilities: Able to feed self;Needs set up Patient Positioning: Upright in chair Baseline Vocal Quality: Normal Volitional Cough: Strong Volitional Swallow: Able to elicit    Oral/Motor/Sensory Function Overall Oral Motor/Sensory Function: Within functional limits   Ice Chips Ice chips: Not tested   Thin Liquid Thin Liquid: Within functional limits Presentation: Cup;Self Fed;Straw(4 trials via each) Other Comments: she does not use straws at home    Nectar Thick Nectar Thick Liquid: Not tested   Honey Thick Honey Thick Liquid: Not tested   Puree Puree: Within functional limits Presentation: Self Fed;Spoon(3 trials)   Solid     Solid: Within functional limits Presentation: Self Fed(2 peanut butter crackers)       Orinda Kenner, MS, CCC-SLP Izaah Westman 02/16/2019,4:07 PM

## 2019-02-16 NOTE — Progress Notes (Signed)
Patient having difficulty swallowing medication. Refusing to take meds crushed. Discussed with on call provider Rufina Falco NP, received verbal order for speech eval.

## 2019-02-17 LAB — PROTIME-INR
INR: 2.3 — ABNORMAL HIGH (ref 0.8–1.2)
Prothrombin Time: 25.4 seconds — ABNORMAL HIGH (ref 11.4–15.2)

## 2019-02-17 MED ORDER — WARFARIN SODIUM 4 MG PO TABS
4.0000 mg | ORAL_TABLET | Freq: Once | ORAL | Status: AC
  Administered 2019-02-17: 17:00:00 4 mg via ORAL
  Filled 2019-02-17: qty 1

## 2019-02-17 MED ORDER — METOPROLOL TARTRATE 25 MG PO TABS
25.0000 mg | ORAL_TABLET | Freq: Four times a day (QID) | ORAL | Status: DC
  Administered 2019-02-17 – 2019-02-18 (×4): 25 mg via ORAL
  Filled 2019-02-17 (×4): qty 1

## 2019-02-17 NOTE — Clinical Social Work Note (Signed)
Patient suffers from a left hip fracture which impairs their ability to perform daily activities like meal preparation, toiletting, and home making in the home. A walker will not resolve issue with performing activities of daily living. A wheelchair will allow patient to safely perform daily activities. Patient is not able to propel themselves in the home using a standard weight wheelchair due to weakness and pain. Patient can self propel in the lightweight wheelchair. Length of need: 6 months. Accessories: elevating leg rests (ELRs), wheel locks, extensions and anti-tippers, and back cushion.  Dayton Scrape, [place]

## 2019-02-17 NOTE — Progress Notes (Signed)
Physical Therapy Treatment Patient Details Name: Elizabeth Fields MRN: [id number] DOB: [birth year] Today's Date: 02/17/2019    History of Present Illness Pt is an 86 y.o. female presenting to hospital 02/09/19 s/p fall with L hip pain; imaging showing displaced L femoral neck fx with resultant varus deformity; surgery delayed d/t elevated INR.  Pt s/p L anterior approach hemi-hip arthroplasty 02/12/19.  PMH includes T12, L1, and L4 compression fx's, a-fib, CHF, htn, CKD, Bergmann syndrome, h/o R hip fx    PT Comments    Pt was seated up in recliner with spouse present upon arriving. She agrees to PT session and is cooperative throughout. Resting HR 108 bpm and sao2 94% on room air. Pt agrees to ambulation. She was able to stand and ambulate to doorway CGA with RW + gait belt. HR elevated to 140s and ambulation back to recliner. With seated rest HR decrease to 110 bpm after ~ 1 minute rest. Pt was asymptomatic throughout. Pt then performed LE exercise handout (exercises listed below) with cuing only. She was repositioned in recliner post session with call bell in reach, Rn aware of HR, spouse at bedside, and LEs elevated. PT will continue current POC progressing as able per pt tolerance.  Pt is planning to d/c to home with HHPT and spouse for 24 hour supervision.   Follow Up Recommendations  Supervision/Assistance - 24 hour;Other (comment);Home health PT(pt and spouse want to d/c home with HHPT)     Equipment Recommendations  Rolling walker with 5" wheels;3in1 (PT);Wheelchair (measurements PT);Wheelchair cushion (measurements PT);Hospital bed    Recommendations for Other Services       Precautions / Restrictions Precautions Precautions: Anterior Hip Precaution Booklet Issued: Yes (comment) Restrictions Weight Bearing Restrictions: Yes LLE Weight Bearing: Weight bearing as tolerated    Mobility  Bed Mobility               General bed mobility comments: pt was in recliner upon entry/exit    Transfers Overall transfer level: Needs assistance Equipment used: Rolling walker (2 wheeled) Transfers: Sit to/from Stand Sit to Stand: Min guard         General transfer comment: Pt demonstrated safe ability to stand/sit to/from recliner with vcs for handplacement and CGA for safety.   Ambulation/Gait Ambulation/Gait assistance: Min guard Gait Distance (Feet): 20 Feet Assistive device: Rolling walker (2 wheeled) Gait Pattern/deviations: Step-through pattern;Antalgic;Trunk flexed Gait velocity: decreased   General Gait Details: Pt was able to ambulate from recliner to doorway and return. Limited distance 2/2 to HR elevated to 140s. pt was assymptomatic.   Stairs             Wheelchair Mobility    Modified Rankin (Stroke Patients Only)       Balance Overall balance assessment: Needs assistance Sitting-balance support: Feet supported Sitting balance-[name]: Good Sitting balance - Comments: no LOB noted   Standing balance support: Bilateral upper extremity supported Standing balance-[name]: Fair Standing balance comment: Pt does rely heavily on RW for support                            Cognition Arousal/Alertness: Awake/alert Behavior During Therapy: WFL for tasks assessed/performed Overall Cognitive Status: Within Functional Limits for tasks assessed                                 General Comments: Pt is A and O  x 4. required increased time to respond. she is [facility]      Exercises General Exercises - Lower Extremity Ankle Circles/Pumps: Strengthening;20 reps Quad Sets: Strengthening;Both;10 reps Gluteal Sets: Strengthening;15 reps Short Arc Quad: Strengthening;Both;15 reps Long Arc Quad: Strengthening;Right;Left;15 reps Heel Slides: Strengthening;Both;15 reps Hip ABduction/ADduction: AAROM;Strengthening;Left;10 reps;Supine Straight Leg Raises: Strengthening;AAROM;10 reps    General Comments        Pertinent  Vitals/Pain Pain Assessment: No/denies pain Pain Score: 0-No pain Faces Pain Scale: Hurts a little bit Pain Location: l hip Pain Descriptors / Indicators: Burning Pain Intervention(s): Limited activity within patient's tolerance;Monitored during session;Repositioned    Home Living                      Prior Function            PT Goals (current goals can now be found in the care plan section) Acute Rehab PT Goals Patient Stated Goal: to improve mobility Progress towards PT goals: Progressing toward goals    Frequency    BID      PT Plan Current plan remains appropriate    Co-evaluation     PT goals addressed during session: Mobility/safety with mobility;Strengthening/ROM        AM-PAC PT "6 Clicks" Mobility   Outcome Measure  Help needed turning from your back to your side while in a flat bed without using bedrails?: A Little Help needed moving from lying on your back to sitting on the side of a flat bed without using bedrails?: A Little Help needed moving to and from a bed to a chair (including a wheelchair)?: A Little Help needed standing up from a chair using your arms (e.g., wheelchair or bedside chair)?: A Little Help needed to walk in hospital room?: A Little Help needed climbing 3-5 steps with a railing? : A Little 6 Click Score: 18    End of Session Equipment Utilized During Treatment: Gait belt Activity Tolerance: Patient tolerated treatment well;Treatment limited secondary to medical complications (Comment)(limited by A fic and HR elevated to 140s with minimal activi) Patient left: in chair;with call bell/phone within reach;with chair alarm set;with family/visitor present Nurse Communication: Mobility status PT Visit Diagnosis: Unsteadiness on feet (R26.81);Muscle weakness (generalized) (M62.81);Difficulty in walking, not elsewhere classified (R26.2) Pain - Right/Left: Left Pain - part of body: Hip     Time: 1443-1540 PT Time Calculation  (min) (ACUTE ONLY): 16 min  Charges:  $Therapeutic Activity: 8-22 mins                     Julaine Fusi PTA 02/17/19, 4:08 PM

## 2019-02-17 NOTE — Progress Notes (Signed)
Ch visited with Pt and husband. Pt shared about struggling to eat, and how her heart isn't doing very well. Pt and husband both got very emotional and teary-eyed after prayer. Pt thanked Ch for visit and prayer. Pt's husband very concerned about Pt's health, and kept referring to their 57 yr marriage; he says he doesn't want to lose her.   02/17/19 1352  Clinical Encounter Type  Visited With Patient and family together  Visit Type Initial  Referral From Other (Comment) (Routine)  Consult/Referral To Other (Comment)  Spiritual Encounters  Spiritual Needs Prayer;Emotional  Stress Factors  Patient Stress Factors Health changes;Loss of control;Major life changes  Family Stress Factors Loss of control;Major life changes

## 2019-02-17 NOTE — TOC Progression Note (Addendum)
Transition of Care [facility]) - Progression Note    Patient Details  Name: Elizabeth Fields MRN: [id number] Date of Birth: Mar 24, 1931  Transition of Care [facility]) CM/SW [place], LCSW Phone Number: 02/17/2019, 9:39 AM  Clinical Narrative: [facility] representative has not gotten an answer on if they can accept patient or not. She is checking on this and will follow up.    12:22 pm: [facility] unable to accept. [facility] can only accept if no RN or aide needed. [facility] unable to have a nurse come out until the weekend and their aide is out sick. [facility] is checking to see if they can take her.  2:20 pm: Patient has been accepted by [facility] for PT, OT, RN, aide, and SW services. Nurse cannot start until the weekend. Patient and her husband are aware and agreeable. Ordered wheelchair and 3-in-1 through [employer]. Patient and husband prefer they be delivered to the home.  Expected Discharge Plan: [place] Barriers to Discharge: Continued Medical Work up  Expected Discharge Plan and Services Expected Discharge Plan: [place] In-house Referral: Clinical Social Work     Living arrangements for the past 2 months: Single Family Home                                       Social Determinants of Health (SDOH) Interventions    Readmission Risk Interventions No flowsheet data found.

## 2019-02-17 NOTE — Progress Notes (Signed)
Paged E. Ouma, NP @[phone]  concerning pt HR up to 130-150s with minimal exertion. Sched metoprolol given

## 2019-02-17 NOTE — Progress Notes (Signed)
PROGRESS NOTE    Elizabeth Fields  RWE:[id number] DOB: 05/28/1931 DOA: 02/09/2019 PCP: Baxter Hire, MD    Brief Narrative:  86 y.o.femalewith medical history ofA-Fibon warfarin, HTN, HLD, renal insufficiency, hypothyroidism, aortic and mitral valve disease, presented to the ED on 2/1 withleft hip pain after amechanicalfallat home after which she wasnot able to move that hip or put weight on itthe left leg.In the ED, vitals stable, labs unremarkable except INR 2.5. Left hip x-ray showed a displaced femoral neck fracture.     Consultants:   Orthopedics  Procedures: None  Antimicrobials:   None   Subjective: Heart rate variable at times not controlled.  This a.m. was 130s and then dropped to normal.  Patient has no complaints denies any shortness of breath, tachycardia  Objective: Vitals:   02/17/19 0021 02/17/19 0439 02/17/19 0500 02/17/19 0746  BP: 110/70 (!) 118/58  (!) 135/54  Pulse: (!) 103 (!) 125  98  Resp: 15   16  Temp: 98.7 F (37.1 C)   98.7 F (37.1 C)  TempSrc: Oral   Oral  SpO2: 98%   94%  Weight:   58 kg   Height:        Intake/Output Summary (Last 24 hours) at 02/17/2019 1122 Last data filed at 02/17/2019 1000 Gross per 24 hour  Intake 460 ml  Output 0 ml  Net 460 ml   Filed Weights   02/13/19 0500 02/14/19 0500 02/17/19 0500  Weight: 56.4 kg 55.4 kg 58 kg    Examination:  General exam: Appears calm and comfortable, NAD Respiratory system: more cta, no wheezing or rales. Decrease bs at bases.  Cardiovascular system: S1 & S2 heard, regular/irregular,  no  murmurs, rubs, gallops or clicks. Gastrointestinal system: Abdomen is nondistended, soft and nontender. Normal bowel sounds heard.   YP:PJKDT in place  Central nervous system: Alert and oriented.  Cranial nerves grossly intact Extremities: no edema or cyanosis, SCD in place Skin: warm /dry Psychiatry: Judgement and insight appear normal. Mood & affect appropriate.      Data Reviewed: I have personally reviewed following labs and imaging studies  CBC: Recent Labs  Lab 02/11/19 0634 02/12/19 1630 02/13/19 0612 02/14/19 0714 02/15/19 0022  WBC 10.2 10.3 7.7 8.5 9.1  NEUTROABS  --   --   --   --  6.9  HGB 13.2 12.5 10.8* 10.6* 9.4*  HCT 38.9 37.8 33.1* 32.5* 28.8*  MCV 93.3 94.7 97.6 98.2 97.0  PLT 208 172 169 185 267   Basic Metabolic Panel: Recent Labs  Lab 02/11/19 0634 02/12/19 0323 02/12/19 1630 02/13/19 0612 02/14/19 0714 02/15/19 0022 02/16/19 0113  NA 140   < > 137 140 137 132* 132*  K 3.7   < > 4.3 4.5 4.1 4.0 4.2  CL 100   < > 104 107 105 102 102  CO2 27   < > 18* 24 24 20* 22  GLUCOSE 111*   < > 100* 113* 116* 134* 116*  BUN 38*   < > 26* 29* 21 18 21   CREATININE 1.67*   < > 1.11* 1.22* 1.21* 0.97 1.04*  CALCIUM 9.3   < > 8.3* 7.7* 8.0* 7.7* 7.8*  MG 2.5*  --   --   --   --   --   --    < > = values in this interval not displayed.   GFR: Estimated Creatinine Clearance: 31.3 mL/min (A) (by C-G formula based on SCr of 1.04 mg/dL (  H)). Liver Function Tests: No results for input(s): AST, ALT, ALKPHOS, BILITOT, PROT, ALBUMIN in the last 168 hours. No results for input(s): LIPASE, AMYLASE in the last 168 hours. No results for input(s): AMMONIA in the last 168 hours. Coagulation Profile: Recent Labs  Lab 02/13/19 0612 02/14/19 0714 02/15/19 0319 02/16/19 0113 02/17/19 0352  INR 1.3* 1.4* 1.8* 2.7* 2.3*   Cardiac Enzymes: No results for input(s): CKTOTAL, CKMB, CKMBINDEX, TROPONINI in the last 168 hours. BNP (last 3 results) No results for input(s): PROBNP in the last 8760 hours. HbA1C: No results for input(s): HGBA1C in the last 72 hours. CBG: No results for input(s): GLUCAP in the last 168 hours. Lipid Profile: No results for input(s): CHOL, HDL, LDLCALC, TRIG, CHOLHDL, LDLDIRECT in the last 72 hours. Thyroid Function Tests: No results for input(s): TSH, T4TOTAL, FREET4, T3FREE, THYROIDAB in the last 72  hours. Anemia Panel: No results for input(s): VITAMINB12, FOLATE, FERRITIN, TIBC, IRON, RETICCTPCT in the last 72 hours. Sepsis Labs: Recent Labs  Lab 02/15/19 0022 02/15/19 0319 02/16/19 0113  PROCALCITON 0.34 0.37 0.39  LATICACIDVEN 2.3* 1.5  --     Recent Results (from the past 240 hour(s))  Respiratory Panel by RT PCR (Flu A&B, Covid) - Nasopharyngeal Swab     Status: None   Collection Time: 02/09/19  5:59 PM   Specimen: Nasopharyngeal Swab  Result Value Ref Range Status   SARS Coronavirus 2 by RT PCR NEGATIVE NEGATIVE Final    Comment: (NOTE) SARS-CoV-2 target nucleic acids are NOT DETECTED. The SARS-CoV-2 RNA is generally detectable in upper respiratoy specimens during the acute phase of infection. The lowest concentration of SARS-CoV-2 viral copies this assay can detect is 131 copies/mL. A negative result does not preclude SARS-Cov-2 infection and should not be used as the sole basis for treatment or other patient management decisions. A negative result may occur with  improper specimen collection/handling, submission of specimen other than nasopharyngeal swab, presence of viral mutation(s) within the areas targeted by this assay, and inadequate number of viral copies (<131 copies/mL). A negative result must be combined with clinical observations, patient history, and epidemiological information. The expected result is Negative. Fact Sheet for Patients:  PinkCheek.be Fact Sheet for Healthcare Providers:  GravelBags.it This test is not yet ap proved or cleared by the [place] FDA and  has been authorized for detection and/or diagnosis of SARS-CoV-2 by FDA under an Emergency Use Authorization (EUA). This EUA will remain  in effect (meaning this test can be used) for the duration of the COVID-19 declaration under Section 564(b)(1) of the Act, 21 U.S.C. section 360bbb-3(b)(1), unless the authorization is  terminated or revoked sooner.    Influenza A by PCR NEGATIVE NEGATIVE Final   Influenza B by PCR NEGATIVE NEGATIVE Final    Comment: (NOTE) The Xpert Xpress SARS-CoV-2/FLU/RSV assay is intended as an aid in  the diagnosis of influenza from Nasopharyngeal swab specimens and  should not be used as a sole basis for treatment. Nasal washings and  aspirates are unacceptable for Xpert Xpress SARS-CoV-2/FLU/RSV  testing. Fact Sheet for Patients: PinkCheek.be Fact Sheet for Healthcare Providers: GravelBags.it This test is not yet approved or cleared by the [place] FDA and  has been authorized for detection and/or diagnosis of SARS-CoV-2 by  FDA under an Emergency Use Authorization (EUA). This EUA will remain  in effect (meaning this test can be used) for the duration of the  Covid-19 declaration under Section 564(b)(1) of the Act, 21  U.S.C. section  360bbb-3(b)(1), unless the authorization is  terminated or revoked. Performed at [facility], [address], [place], [place] [address]   Surgical PCR screen     Status: None   Collection Time: 02/12/19  3:01 AM   Specimen: Nasal Mucosa; Nasal Swab  Result Value Ref Range Status   MRSA, PCR NEGATIVE NEGATIVE Final   Staphylococcus aureus NEGATIVE NEGATIVE Final    Comment: (NOTE) The Xpert SA Assay (FDA approved for NASAL specimens in patients 58 years of age and older), is one component of a comprehensive surveillance program. It is not intended to diagnose infection nor to guide or monitor treatment. Performed at [facility], [address] [place], [place] [address]   CULTURE, BLOOD (ROUTINE X 2) w Reflex to ID Panel     Status: None (Preliminary result)   Collection Time: 02/15/19 12:22 AM   Specimen: BLOOD  Result Value Ref Range Status   Specimen Description BLOOD RIGHT FOREARM  Final   Special Requests   Final    BOTTLES DRAWN AEROBIC AND  ANAEROBIC Blood Culture adequate volume   Culture   Final    NO GROWTH 2 DAYS Performed at [facility], [address], [place], [place] [address]    Report Status PENDING  Incomplete  CULTURE, BLOOD (ROUTINE X 2) w Reflex to ID Panel     Status: None (Preliminary result)   Collection Time: 02/15/19 12:22 AM   Specimen: BLOOD  Result Value Ref Range Status   Specimen Description BLOOD RIGHT HAND  Final   Special Requests   Final    BOTTLES DRAWN AEROBIC AND ANAEROBIC Blood Culture adequate volume   Culture   Final    NO GROWTH 2 DAYS Performed at [facility], [address], [place], [place] [address]    Report Status PENDING  Incomplete  Urine Culture     Status: None   Collection Time: 02/15/19  1:02 AM   Specimen: Urine, Random  Result Value Ref Range Status   Specimen Description URINE, RANDOM  Final   Special Requests   Final    NONE Performed at [facility], [address] [place], [place] [address]    Culture NO GROWTH  Final   Report Status 02/16/2019 FINAL  Final         Radiology Studies: No results found.      Scheduled Meds: . Chlorhexidine Gluconate Cloth  6 each Topical Daily  . docusate sodium  100 mg Oral BID  . doxepin  10 mg Oral Daily  . famotidine  20 mg Oral Daily  . feeding supplement (ENSURE ENLIVE)  237 mL Oral BID BM  . ferrous [id number] C-folic acid  1 capsule Oral BID  . furosemide  20 mg Oral Daily  . levothyroxine  50 mcg Oral QAC breakfast  . magnesium oxide  400 mg Oral Daily  . metoprolol tartrate  12.5 mg Oral Q6H  . senna  1 tablet Oral BID  . simvastatin  20 mg Oral Daily  . warfarin  4 mg Oral ONCE-1800  . Warfarin - Pharmacist Dosing Inpatient   Does not apply q1800   Continuous Infusions:   Assessment & Plan:   Principal Problem:   Hip fracture, left (HCC) Active Problems:   Atrial fibrillation (HCC)   Arteriosclerosis of coronary artery   Chronic kidney disease  (CKD), stage III (moderate)   HLD (hyperlipidemia)   Osteoporosis, post-menopausal   Hypothyroidism   Protein-calorie malnutrition, severe  Fall   Hip fracture, left Displaced left femoral neck fracture secondary to mechanical fall. --Ortho following-s/p left hemihip arthroplasty 02/12/19 --surgery was initially held until INR comes down --> Ortho gave IV vit K already.  Postop: pt was hypotensive, given IVF boluses, urine outpt was good earlier then less . Bladder scan negative.  Was on IV fluids, now hemodynamically stable Monitor I/o. --pain management -mostly requiring Tylenol --bowel regimen --PT-recommend SNF Weightbearing as tolerated Follow-up with [facility] orth[facility]ics in 2 weeks for staple removal and Steri-Strip application TED hose bilateral lower extremities x6 weeks, okay to remove at nighttime. Continue with Coumadin Tylenol and Norco for pain.  Prescription for Norco in chart.   Fever-likely postop fever. UA negative chest x-ray pulmonary edema, urine culture negative, blood culture pending- so far negative.   hold off on IV antibiotics unless have a reason to start if cx come back +. Incentive spirometer   Atrial fibrillation-at times not rate controlled,on warfarin. --a/c on held for surgery --continue coumadin now --inr goal 2-3,INR is therapeutic -Rate variable and uncontrolled at times.  Will increase metoprolol to 25 mg p.o. every 6 with parameters but will need to switch to XL prior to discharge   Coronary arterydisease- stable, no chest pain Valvular Heart Disease- stable --Place on telemetry since heart rate has been variable at times uncontrolled --continue spironolactone..> hold since with a/cki More euvolemic.  Resume p.o. Lasix. Was given Lasix 20 mg IV x1 and fluids were discontinued   HLD (hyperlipidemia) --continue simvastatin  Acute on Chronic kidney disease (CKD), stage III (moderate)- Stable. Due to hypotension /low vol.  Status -Continue to hold Aldactone for now It has improved with IV fluid for hydration-will discontinue since patient is mildly volume overloaded Continue to monitor monitor   Osteoporosis, post-menopausal- with multiple fragility fractures including current left hip, prior right hip and multiple vertebral compression fractures. Does not ppear to be on home medication for this. --recommend initiation of therapy ASAP, follow up with PCP --Continue her home vitamin D vitamin D   Hypothyroidism - continue Synthroid  GERD - continue Protonix   Other - continue Doxepin   DVT prophylaxis:Lovenox,  Code Status: Full Code Family Communication: None at bedside  Disposition Plan:PT has recommended SNF bed pending.  Husband and patient refused SNF and will be going home with home PT.  However heart rate variable and not well controlled will need to keep her overnight to see if heart rate better controlled overnight with increased beta-blockers.  If stable can DC in a.m.   LOS: 8 days   Time spent:45 min with >50% on coc    Nolberto Hanlon, MD [facility] Pager [phone]  If 7PM-7AM, please contact night-coverage [email or web address] Password [facility] 02/17/2019, 11:22 AM Patient ID: Elizabeth Fields, female   DOB: 1931/02/02, 86 y.o.   MRN: [id number]

## 2019-02-17 NOTE — Progress Notes (Signed)
Nutrition Follow-up  RD working remotely.  DOCUMENTATION CODES:   Severe malnutrition in context of chronic illness  INTERVENTION:  Continue Ensure Enlive po BID, each supplement provides 350 kcal and 20 grams of protein.  Continue Magic cup TID with meals, each supplement provides 290 kcal and 9 grams of protein.  NUTRITION DIAGNOSIS:   Severe Malnutrition related to chronic illness(CHF, inadequate oral intake) as evidenced by severe fat depletion, severe muscle depletion.  Ongoing.  GOAL:   Patient will meet greater than or equal to 90% of their needs  Progressing.  MONITOR:   PO intake, Supplement acceptance, Labs, Weight trends, Skin, I & O's  REASON FOR ASSESSMENT:   Consult Assessment of nutrition requirement/status  ASSESSMENT:   86 year old female with PMHx of A-fib, HLD, HTN, CHF, hypothyroidism, renal insufficiency admitted with displaced left femoral neck fracture.   -Patient is s/p left hemi hip arthroplasty on 2/4.  Attempted to call patient over the phone for follow-up assessment. On first attempt patient's husband answered and asked RD to call back as patient was working with Therapist, sports. On subsequent attempts they were unable to answer the phone. According to chart patient's PO intake appears to have improved some as she is documented to be eating about 50% of her meals. She appears to be drinking 1-2 bottles of Ensure Enlive daily.  Medications reviewed and include: Colace 100 mg BID, famotidine, Trinsicon/Foltrin 1 capsule BID, Lasix 20 mg daily, levothyroxine, magnesium oxide 400 mg daily, senna 1 tablet BID, warfarin.  Labs reviewed: Sodium 132, Creatinine 1.04.  Diet Order:   Diet Order            Diet regular Room service appropriate? Yes; Fluid consistency: Thin  Diet effective now             EDUCATION NEEDS:   Education needs have been addressed  Skin:  Skin Assessment: Skin Integrity Issues:(closed incision left hip)  Last BM:  02/17/2019  small type 2  Height:   Ht Readings from Last 1 Encounters:  02/12/19 5\' 2"  (1.575 m)   Weight:   Wt Readings from Last 1 Encounters:  02/17/19 58 kg   Ideal Body Weight:  50 kg  BMI:  Body mass index is 23.39 kg/m.  Estimated Nutritional Needs:   Kcal:  1200-1400  Protein:  60-70 grams  Fluid:  1.2-1.4 L/day  Jacklynn Barnacle, MS, RD, LDN Pager number available on Amion

## 2019-02-17 NOTE — Progress Notes (Signed)
PT Cancellation Note  Patient Details Name: Elizabeth Fields MRN: [id number] DOB: Nov 21, 1931   Cancelled Treatment:     PT attempt. Pt's resting HR 140 this morning, A fib. PT will continue to follow and return later this date if pt becomes more appropriate to participate.    Willette Pa 02/17/2019, 12:10 PM

## 2019-02-17 NOTE — Progress Notes (Signed)
OT Cancellation Note  Patient Details Name: Elizabeth Fields MRN: [id number] DOB: Feb 01, 1931   Cancelled Treatment:    Reason Eval/Treat Not Completed: Medical issues which prohibited therapy. Per telemetry monitor, pt's HR varying between 110's to 140 at rest in bed. Will hold OT tx at this time and re-attempt at later date/time as pt is medically appropriate to participate.   Jeni Salles, MPH, MS, OTR/L ascom [phone] 02/17/19, 11:38 AM

## 2019-02-17 NOTE — Consult Note (Signed)
[facility] for Warfarin/Lovenox Indication: atrial fibrillation/stroke prevention  Allergies  Allergen Reactions  . Fosamax [Alendronate Sodium] Nausea And Vomiting  . Other Rash  . Penicillins Nausea And Vomiting and Palpitations  . Tramadol Nausea And Vomiting and Palpitations    Patient Measurements: Height: 5\' 2"  (157.5 cm) Weight: 127 lb 13.9 oz (58 kg) IBW/kg (Calculated) : 50.1  Vital Signs: Temp: 98.7 F (37.1 C) (02/09 0746) Temp Source: Oral (02/09 0746) BP: 135/54 (02/09 0746) Pulse Rate: 98 (02/09 0746)  Labs: Recent Labs    02/15/19 0022 02/15/19 0319 02/16/19 0113 02/17/19 0352  HGB 9.4*  --   --   --   HCT 28.8*  --   --   --   PLT 195  --   --   --   LABPROT  --  20.6* 28.4* 25.4*  INR  --  1.8* 2.7* 2.3*  CREATININE 0.97  --  1.04*  --     Estimated Creatinine Clearance: 31.3 mL/min (A) (by C-G formula based on SCr of 1.04 mg/dL (H)).  Medications:  Pt home dose of Warfarin is 4mg  daily.  Date Time INR Response  02/01  1653  2.5 ---- 02/02 0348 2.8  Vit K 1mg  @ 1439 02/03  0634  2.2 Vit K 5mg  @ 1749 02/04  0323 1.3 Surgery, warfarin 6mg  02/05   0612    1.3 Warfarin 6 mg  02/06 0714 1.4 Warfarin 6 mg 02/07 0319 1.8 Warfarin 4 mg 02/08 0113 2.7 Warfarin 4 mg 02/09 0352 2.3 Warfarin 4 mg  Assessment: Pharmacy has been consulted to initiate and monitor Warfarin therapy for a fib.  Will likely take a few days to achieve therapeutic INR, even with aggressive Warfarin dosing. On enoxaparin bridge.   CrCl = 31.3 ml/min  Goal of Therapy:  INR 2-3 Monitor platelets by anticoagulation protocol: Yes   Plan:  2/8 INR now therapeutic. Will continue home warfarin 4 mg tonight. Lovenox bridge has now been discontinued.  Will continue to monitor INR level with AM labs and adjust dose accordingly.  Pearla Dubonnet, PharmD Clinical Pharmacist 02/17/2019 7:47 AM

## 2019-02-17 NOTE — Progress Notes (Signed)
   Subjective: 5 Days Post-Op Procedure(s) (LRB): ANTERIOR APPROACH HEMI HIP ARTHROPLASTY (Left) Patient reports pain as mild.   Patient is well, and has had no acute complaints or problems Denies any CP, SOB, ABD pain. Afebrile We will continue therapy today.   Objective: Vital signs in last 24 hours: Temp:  [98.5 F (36.9 C)-98.7 F (37.1 C)] 98.7 F (37.1 C) (02/09 0746) Pulse Rate:  [96-136] 98 (02/09 0746) Resp:  [14-18] 16 (02/09 0746) BP: (106-135)/(52-70) 135/54 (02/09 0746) SpO2:  [94 %-100 %] 94 % (02/09 0746) Weight:  [58 kg] 58 kg (02/09 0500)  Intake/Output from previous day: 02/08 0701 - 02/09 0700 In: 220 [P.O.:220] Out: 1 [Stool:1] Intake/Output this shift: No intake/output data recorded.  Recent Labs    02/15/19 0022  HGB 9.4*   Recent Labs    02/15/19 0022  WBC 9.1  RBC 2.97*  HCT 28.8*  PLT 195   Recent Labs    02/15/19 0022 02/16/19 0113  NA 132* 132*  K 4.0 4.2  CL 102 102  CO2 20* 22  BUN 18 21  CREATININE 0.97 1.04*  GLUCOSE 134* 116*  CALCIUM 7.7* 7.8*   Recent Labs    02/16/19 0113 02/17/19 0352  INR 2.7* 2.3*    EXAM General - Patient is Alert, Appropriate and Oriented Extremity - Neurovascular intact Sensation intact distally Intact pulses distally Dorsiflexion/Plantar flexion intact No cellulitis present Compartment soft  Negative Homans' sign bilaterally Dressing - dressing C/D/I and scant drainage,. Motor Function - intact, moving foot and toes well on exam.   Past Medical History:  Diagnosis Date  . Allergy   . Atrial fibrillation ([place])   . CHF (congestive heart failure) ([place])   . History of squamous cell carcinoma   . Hyperlipidemia   . Hypertension   . Renal insufficiency   . Thyroid disease     Assessment/Plan:   5 Days Post-Op Procedure(s) (LRB): ANTERIOR APPROACH HEMI HIP ARTHROPLASTY (Left) Principal Problem:   Hip fracture, left (HCC) Active Problems:   Atrial fibrillation (HCC)  Arteriosclerosis of coronary artery   Chronic kidney disease (CKD), stage III (moderate)   HLD (hyperlipidemia)   Osteoporosis, post-menopausal   Hypothyroidism   Protein-calorie malnutrition, severe   Fall  Estimated body mass index is 23.39 kg/m as calculated from the following:   Height as of this encounter: 5\' 2"  (1.575 m).   Weight as of this encounter: 58 kg. Advance diet Up with therapy, weightbearing as tolerated Acute post op blood loss anemia -hemoglobin Stable.  Continue with iron supplement Vital signs stable Pain controlled Incision site appears well.  No signs of infection.  No signs of DVT bilaterally Care management to assist with discharge to home with HHPT.   Follow-up with [facility] orthopedics in 2 weeks for staple removal and Steri-Strip application TED hose bilateral lower extremities x6 weeks, okay to remove at nighttime. Continue with Coumadin Tylenol and Norco for pain.  Prescription for Norco in chart.    DVT Prophylaxis - Coumadin, TED hose and SCDs Weight-Bearing as tolerated to left leg   T. Rachelle Hora, PA-C [place] 02/17/2019, 9:35 AM

## 2019-02-17 NOTE — Care Management Important Message (Signed)
Important Message  Patient Details  Name: Elizabeth Fields MRN: [id number] Date of Birth: 30-Aug-1931   Medicare Important Message Given:  Yes     [name] 02/17/2019, 10:44 AM

## 2019-02-18 LAB — PROTIME-INR
INR: 2.3 — ABNORMAL HIGH (ref 0.8–1.2)
Prothrombin Time: 25.2 seconds — ABNORMAL HIGH (ref 11.4–15.2)

## 2019-02-18 MED ORDER — FE FUMARATE-B12-VIT C-FA-IFC PO CAPS: 1.0000 | ORAL_CAPSULE | Freq: Two times a day (BID) | ORAL | 0 refills | Status: DC

## 2019-02-18 MED ORDER — FUROSEMIDE 20 MG PO TABS: 20.0000 mg | ORAL_TABLET | Freq: Every day | ORAL | 0 refills | Status: DC

## 2019-02-18 MED ORDER — WARFARIN SODIUM 4 MG PO TABS
4.0000 mg | ORAL_TABLET | Freq: Once | ORAL | Status: DC
  Filled 2019-02-18: qty 1

## 2019-02-18 MED ORDER — METHOCARBAMOL 750 MG PO TABS: 750.0000 mg | ORAL_TABLET | Freq: Four times a day (QID) | ORAL | 0 refills | Status: AC | PRN

## 2019-02-18 MED ORDER — ENSURE ENLIVE PO LIQD: 237.0000 mL | Freq: Two times a day (BID) | ORAL | 12 refills | Status: DC

## 2019-02-18 NOTE — Progress Notes (Signed)
Physical Therapy Treatment Patient Details Name: Elizabeth Fields MRN: [id number] DOB: 26-Mar-1931 Today's Date: 02/18/2019    History of Present Illness Pt is an 86 y.o. female presenting to hospital 02/09/19 s/p fall with L hip pain; imaging showing displaced L femoral neck fx with resultant varus deformity; surgery delayed d/t elevated INR.  Pt s/p L anterior approach hemi-hip arthroplasty 02/12/19.  PMH includes T12, L1, and L4 compression fx's, a-fib, CHF, htn, CKD, Bergmann syndrome, h/o R hip fx    PT Comments    Pt was seated in recliner upon arriving. She agrees to PT session and is cooperative throughout. Reports 4/10 pain at rest that increased to 6/10 with wt bearing. Pt's spouse present throughout session and very supportive. Lengthy discussion about d/c and what to expect from [name]. Pt/ pt's spouse feel confident/safe about d/c to home this afternoon. Pt was able to stand with supervision and ambulate 120 ft with RW. She was cued for improved posture and sequencing however no LOB noted or unsteadiness present. She refused stair training this date but has previously performed. Pt did require min assist when returning to supine in bed from EOB sitting. Therapist explained to pt/spouse that she will require assistance getting into bed at home. Once pt was supine, she performed exercise handout (exercises listed below).Overall pt tolerated session well and is progressing well with PT.     Follow Up Recommendations  Home health PT;Supervision/Assistance - 24 hour     Equipment Recommendations  Rolling walker with 5" wheels;3in1 (PT);Wheelchair (measurements PT);Wheelchair cushion (measurements PT);Hospital bed    Recommendations for Other Services       Precautions / Restrictions Precautions Precautions: Anterior Hip Precaution Booklet Issued: Yes (comment) Restrictions Weight Bearing Restrictions: Yes LLE Weight Bearing: Weight bearing as tolerated    Mobility  Bed Mobility Overal  bed mobility: Needs Assistance Bed Mobility: Sit to Supine Rolling: Supervision     Sit to supine: Min assist   General bed mobility comments: pt was in recliner upon arriving but requested to get in bed post gait training. MIn assist to achieve supine from EOB sitting with min assist supporting LLE back into bed. spouse present and therapist discussed that he will need to assist her when she d/c to home this afternoon. Both pt and spouse state they feel confident they can get in/out of bed at home safely  Transfers Overall transfer level: Modified independent Equipment used: Rolling walker (2 wheeled) Transfers: Sit to/from Stand Sit to Stand: Supervision Stand pivot transfers: Min assist       General transfer comment: Increased time to perform 2/2 to pain however no lifting assistance required to stand from EOB and from recliner. she demonstrated correct handplacement while adhereing to proper hip precautions  Ambulation/Gait Ambulation/Gait assistance: Supervision;Min guard Gait Distance (Feet): 120 Feet Assistive device: Rolling walker (2 wheeled) Gait Pattern/deviations: Trunk flexed;Antalgic;Decreased stance time - left;Decreased step length - right Gait velocity: decreased   General Gait Details: pt was able to safely ambulated 120 ft with RW with slow cadence + vcs for improved posture. pt relys on RW throughout for UE support to minimize wt bearing on LLE. No LOB or unsteadiness. CGA only when turning. spouse present during gait training. pt's HR elevated to 118 and O2 > 94% throughout.   Stairs         General stair comments: Pt reports performing stairs in previous session and politely declined stair training this session. Therapist reviewed proper sequencing with ascending/descending stairs. Both patient  and spouse state understanding and feel safe they can perform at home.   Wheelchair Mobility    Modified Rankin (Stroke Patients Only)       Balance Overall  balance assessment: Modified Independent Sitting-balance support: Feet supported Sitting balance-[name]: Good Sitting balance - Comments: no LOB noted in sitting   Standing balance support: Bilateral upper extremity supported Standing balance-[name]: Good Standing balance comment: no LOB in standing with BUE support                            Cognition Arousal/Alertness: Awake/alert Behavior During Therapy: WFL for tasks assessed/performed Overall Cognitive Status: Within Functional Limits for tasks assessed                                 General Comments: Pt is A and O x 4. required increased time to respond. she is [facility] Exercises - Lower Extremity Ankle Circles/Pumps: Strengthening;20 reps Quad Sets: Strengthening;Both;10 reps Gluteal Sets: Strengthening;15 reps Short Arc Quad: Strengthening;Both;15 reps Long Arc Quad: Strengthening;Left;10 reps Heel Slides: Strengthening;Left;10 reps Hip ABduction/ADduction: Strengthening;Left;10 reps;AAROM Straight Leg Raises: AAROM;Left;10 reps    General Comments        Pertinent Vitals/Pain Pain Assessment: 0-10 Pain Score: 4  Faces Pain Scale: Hurts little more Pain Location: left hip Pain Descriptors / Indicators: Spasm;Burning Pain Intervention(s): Other (comment)(4/10 pain at rest. 6/10 pain in wt bearing/ during exercise)    Home Living                      Prior Function            PT Goals (current goals can now be found in the care plan section) Acute Rehab PT Goals Patient Stated Goal: " I feel safe and ready to go home today" Progress towards PT goals: Progressing toward goals    Frequency    BID      PT Plan Current plan remains appropriate    Co-evaluation     PT goals addressed during session: Mobility/safety with mobility;Strengthening/ROM;Proper use of DME        AM-PAC PT "6 Clicks" Mobility   Outcome Measure  Help needed  turning from your back to your side while in a flat bed without using bedrails?: None Help needed moving from lying on your back to sitting on the side of a flat bed without using bedrails?: A Little Help needed moving to and from a bed to a chair (including a wheelchair)?: A Little Help needed standing up from a chair using your arms (e.g., wheelchair or bedside chair)?: A Little Help needed to walk in hospital room?: A Little Help needed climbing 3-5 steps with a railing? : A Little 6 Click Score: 19    End of Session Equipment Utilized During Treatment: Gait belt Activity Tolerance: Patient tolerated treatment well Patient left: in bed;with call bell/phone within reach;with bed alarm set Nurse Communication: Mobility status PT Visit Diagnosis: Unsteadiness on feet (R26.81);Muscle weakness (generalized) (M62.81);Difficulty in walking, not elsewhere classified (R26.2) Pain - Right/Left: Left Pain - part of body: Hip     Time: 1030-1055 PT Time Calculation (min) (ACUTE ONLY): 25 min  Charges:  $Gait Training: 8-22 mins $Therapeutic Exercise: 8-22 mins  Julaine Fusi PTA 02/18/19, 11:38 AM

## 2019-02-18 NOTE — TOC Progression Note (Addendum)
Transition of Care [facility]) - Progression Note    Patient Details  Name: Elizabeth Fields MRN: [id number] Date of Birth: Mar 25, 1931  [facility]) CM/SW Contact  Noelle Hoogland, Gardiner Rhyme, LCSW Phone Number: 02/18/2019, 9:33 AM  Clinical Narrative: Pt feels she needs a youth rw and not really a wc. PT voiced she doesn't need a wc due to walking well in therapies. Will wait until husband arrives and discuss with both of them. Husband feels pt will need a wheelchair and walker and is willing to pay for the walker. Aware insurance only covers one. Aware pt being discharged today. Will make sure has her equipment before she goes home today.    Expected Discharge Plan: [place] Barriers to Discharge: Continued Medical Work up  Expected Discharge Plan and Services Expected Discharge Plan: [place] In-house Referral: Clinical Social Work     Living arrangements for the past 2 months: Single Family Home                                       Social Determinants of Health (SDOH) Interventions    Readmission Risk Interventions No flowsheet data found.

## 2019-02-18 NOTE — Progress Notes (Addendum)
   Subjective: 6 Days Post-Op Procedure(s) (LRB): ANTERIOR APPROACH HEMI HIP ARTHROPLASTY (Left) Patient reports pain as mild.   Patient is well, and has had no acute complaints or problems Denies any CP, SOB, ABD pain. Afebrile We will continue therapy today.   Objective: Vital signs in last 24 hours: Temp:  [98.1 F (36.7 C)-98.5 F (36.9 C)] 98.5 F (36.9 C) (02/10 0757) Pulse Rate:  [95-101] 95 (02/10 0757) Resp:  [16-18] 18 (02/10 0757) BP: (110-114)/(43-69) 114/69 (02/10 0757) SpO2:  [94 %-95 %] 94 % (02/10 0757)  Intake/Output from previous day: 02/09 0701 - 02/10 0700 In: 480 [P.O.:480] Out: 0  Intake/Output this shift: No intake/output data recorded.  No results for input(s): HGB in the last 72 hours. No results for input(s): WBC, RBC, HCT, PLT in the last 72 hours. Recent Labs    02/16/19 0113  NA 132*  K 4.2  CL 102  CO2 22  BUN 21  CREATININE 1.04*  GLUCOSE 116*  CALCIUM 7.8*   Recent Labs    02/17/19 0352 02/18/19 0218  INR 2.3* 2.3*    EXAM General - Patient is Alert, Appropriate and Oriented Extremity - Neurovascular intact Sensation intact distally Intact pulses distally Dorsiflexion/Plantar flexion intact No cellulitis present Compartment soft  Negative Homans' sign bilaterally Dressing - dressing C/D/I and scant drainage,. Motor Function - intact, moving foot and toes well on exam.   Past Medical History:  Diagnosis Date  . Allergy   . Atrial fibrillation ([place])   . CHF (congestive heart failure) ([place])   . History of squamous cell carcinoma   . Hyperlipidemia   . Hypertension   . Renal insufficiency   . Thyroid disease     Assessment/Plan:   6 Days Post-Op Procedure(s) (LRB): ANTERIOR APPROACH HEMI HIP ARTHROPLASTY (Left) Principal Problem:   Hip fracture, left (HCC) Active Problems:   Atrial fibrillation (HCC)   Arteriosclerosis of coronary artery   Chronic kidney disease (CKD), stage III (moderate)   HLD  (hyperlipidemia)   Osteoporosis, post-menopausal   Hypothyroidism   Protein-calorie malnutrition, severe   Fall  Estimated body mass index is 23.39 kg/m as calculated from the following:   Height as of this encounter: 5\' 2"  (1.575 m).   Weight as of this encounter: 58 kg. Advance diet Up with therapy, weightbearing as tolerated Acute post op blood loss anemia -hemoglobin Stable.  Continue with iron supplement Vital signs stable Pain controlled Incision site appears well.  No signs of infection.  No signs of DVT bilaterally Care management to assist with discharge to home with HHPT.   Follow-up with [facility] orthopedics in 2 weeks for staple removal and Steri-Strip application TED hose bilateral lower extremities x6 weeks, okay to remove at nighttime. Continue with Coumadin Tylenol and Norco for pain.  Prescription for Norco in chart.    DVT Prophylaxis - Coumadin, TED hose and SCDs Weight-Bearing as tolerated to left leg   T. Rachelle Hora, PA-C [place] 02/18/2019, 8:07 AM

## 2019-02-18 NOTE — Plan of Care (Signed)

## 2019-02-18 NOTE — Progress Notes (Signed)
Occupational Therapy Treatment Patient Details Name: Elizabeth Fields MRN: [id number] DOB: 05/12/1931 Today's Date: 02/18/2019    History of present illness Pt. is an 86 y.o. female presenting to hospital 02/09/19 s/p fall with L hip pain; imaging showing displaced L femoral neck fx with resultant varus deformity; surgery delayed d/t elevated INR.  Pt s/p L anterior approach hemi-hip arthroplasty 02/12/19.  PMH includes T12, L1, and L4 compression fx's, a-fib, CHF, htn, CKD, Bergmann syndrome, h/o R hip fx   OT comments  Pt. education was provided about A/E use for LE ADLs. Pt. was able to demonstrate A/E use for the Left LE with modA. Pt. reports 6/10 spasm pain in the Left LE.  Pt.'s HR 112 bpms at rest, and during the session. Pt. continues to benefit from OT services to review A/E use, provide additional A/E training, and to work towards improving overall ADL, and IADL functioning. Pt. Plans to return home upon discharge. Pt. Will benefit from [facility] services to follow-up at discharge.   Follow Up Recommendations  Home health OT    Equipment Recommendations  3 in 1 bedside commode    Recommendations for Other Services      Precautions / Restrictions Precautions Precautions: Anterior Hip Restrictions Weight Bearing Restrictions: Yes LLE Weight Bearing: Weight bearing as tolerated       Mobility Bed Mobility                  Transfers Overall transfer level: Needs assistance       Stand pivot transfers: Min assist            Balance                                           ADL either performed or assessed with clinical judgement   ADL Overall ADL's : Needs assistance/impaired Eating/Feeding: Independent;Set up   Grooming: Independent;Set up   Upper Body Bathing: Set up;Supervision/ safety   Lower Body Bathing: Set up;Moderate assistance   Upper Body Dressing : Set up;Supervision/safety   Lower Body Dressing: Set up;Moderate assistance                  General ADL Comments: Pt. education was provided about A/E use for LE ADLs.     Vision Baseline Vision/History: No visual deficits Patient Visual Report: No change from baseline     Perception     Praxis      Cognition Arousal/Alertness: Awake/alert Behavior During Therapy: WFL for tasks assessed/performed Overall Cognitive Status: Within Functional Limits for tasks assessed                                          Exercises     Shoulder Instructions       General Comments      Pertinent Vitals/ Pain       Pain Assessment: 0-10 Pain Score: 6  Pain Location: left hip Pain Descriptors / Indicators: Spasm  Home Living                                          Prior Functioning/Environment  Frequency  Min 1X/week        Progress Toward Goals  OT Goals(current goals can now be found in the care plan section)  Progress towards OT goals: Progressing toward goals  Acute Rehab OT Goals Patient Stated Goal: To return home OT Goal Formulation: With patient/family Potential to Achieve Goals: Good  Plan      Co-evaluation                 AM-PAC OT "6 Clicks" Daily Activity     Outcome Measure   Help from another person eating meals?: None Help from another person taking care of personal grooming?: None Help from another person toileting, which includes using toliet, bedpan, or urinal?: A Little Help from another person bathing (including washing, rinsing, drying)?: A Lot Help from another person to put on and taking off regular upper body clothing?: A Little Help from another person to put on and taking off regular lower body clothing?: A Lot 6 Click Score: 18    End of Session    OT Visit Diagnosis: Other abnormalities of gait and mobility (R26.89);Muscle weakness (generalized) (M62.81);History of falling (Z91.81);Pain Pain - Right/Left: Left Pain - part of body: Hip    Activity Tolerance Patient tolerated treatment well;Other (comment)   Patient Left in bed;with call bell/phone within reach;with bed alarm set   Nurse Communication          Time: 0940-1000 OT Time Calculation (min): 20 min  Charges: OT General Charges $OT Visit: 1 Visit OT Treatments $Self Care/Home Management : 8-22 mins  Harrel Carina, MS, OTR/L  Harrel Carina 02/18/2019, 10:32 AM

## 2019-02-18 NOTE — TOC Transition Note (Signed)
Transition of Care [facility]) - CM/SW Discharge Note   Patient Details  Name: Elizabeth Fields MRN: [id number] Date of Birth: 1931-04-27  Transition of [facility]) CM/SW Contact:  Elease Hashimoto, LCSW Phone Number: 02/18/2019, 10:32 AM   Clinical Narrative:  Met with pt and husband to confirm discharge today. Husband feels pt will need both the youth rw and wheelchair, which were ordered along with a 3 in 1. [facility] will provide follow up. Husband can provide the care pt will need. Pt needs to work on getting her strength back, since she was already weak from another medical issues before her fall. Husband wants wheelchair and 3 in 1 delivered to the home and walker to her room prior to DC today.  Both feel ready for pt to go home today. CSW is signing off, needs met.    Final next level of care: Home w Home Health Services Barriers to Discharge: Barriers Resolved   Patient Goals and CMS Choice Patient states their goals for this hospitalization and ongoing recovery are:: Wants to get back home with husband assisting her. He does have a bad back so needs to be careful      Discharge Placement                       Discharge Plan and Services In-house Referral: Clinical Social Work              DME Arranged: 3-N-1, Youth worker wheelchair with seat cushion, Environmental consultant youth DME Agency: [employer] Date DME Agency Contacted: 02/18/19 Time DME Agency Contacted: 41 Representative spoke with at DME Agency: [place]: RN, PT, OT [place] Agency: [facility] [place] Date [place]: 02/18/19 Time [place]: 1032 Representative spoke with at [place]: [place] ([place]) Interventions     Readmission Risk Interventions No flowsheet data found.

## 2019-02-18 NOTE — Consult Note (Signed)
[place] for Warfarin/Lovenox Indication: atrial fibrillation/stroke prevention  Allergies  Allergen Reactions  . Fosamax [Alendronate Sodium] Nausea And Vomiting  . Other Rash  . Penicillins Nausea And Vomiting and Palpitations  . Tramadol Nausea And Vomiting and Palpitations    Patient Measurements: Height: 5\' 2"  (157.5 cm) Weight: 127 lb 13.9 oz (58 kg) IBW/kg (Calculated) : 50.1  Vital Signs: Temp: 98.1 F (36.7 C) (02/10 0042) Temp Source: Oral (02/10 0042) BP: 110/56 (02/10 0042) Pulse Rate: 97 (02/10 0042)  Labs: Recent Labs    02/16/19 0113 02/17/19 0352 02/18/19 0218  LABPROT 28.4* 25.4* 25.2*  INR 2.7* 2.3* 2.3*  CREATININE 1.04*  --   --     Estimated Creatinine Clearance: 31.3 mL/min (A) (by C-G formula based on SCr of 1.04 mg/dL (H)).  Medications:  Pt home dose of Warfarin is 4mg  daily.  Date Time INR Response  02/01  1653  2.5 ---- 02/02 0348 2.8  Vit K 1mg  @ 1439 02/03  0634  2.2 Vit K 5mg  @ 1749 02/04  0323 1.3 Surgery, warfarin 6mg  02/05   0612    1.3 Warfarin 6 mg  02/06 0714 1.4 Warfarin 6 mg 02/07 0319 1.8 Warfarin 4 mg 02/08 0113 2.7 Warfarin 4 mg 02/09 0352 2.3 Warfarin 4 mg 02/10 0218 2.3   Assessment: Pharmacy has been consulted to initiate and monitor Warfarin therapy for a fib.  Will likely take a few days to achieve therapeutic INR, even with aggressive Warfarin dosing. On enoxaparin bridge.   CrCl = 31.3 ml/min  Goal of Therapy:  INR 2-3 Monitor platelets by anticoagulation protocol: Yes   Plan:  INR currently therapeutic. Will continue home warfarin 4 mg tonight. Lovenox bridge has now been discontinued.  Will continue to monitor INR level with AM labs and adjust dose accordingly.  Pearla Dubonnet, PharmD Clinical Pharmacist 02/18/2019 7:26 AM

## 2019-02-18 NOTE — Discharge Summary (Addendum)
Physician Discharge Summary  Elizabeth Fields FMB:[id number] DOB: 1931-09-27 DOA: 02/09/2019  PCP: Baxter Hire, MD  Admit date: 02/09/2019 Discharge date: 02/18/2019  Admitted From: Home Disposition:  Home  Recommendations for Outpatient Follow-up:  Follow up with PCP in 1-2 weeks Please obtain BMP/CBC in one week Please follow up with orthopedic surgeon in clinic in 2 weeks Wear TED hose/stocking on legs for 6 weeks  Home Health: Yes  Equipment/Devices: wheelchair, 3-in-1   Discharge Condition: Stable  CODE STATUS: Full  Diet recommendation: Heart Healthy    Brief/Interim Summary:  86 y.o. female with medical history of A-Fib on warfarin, HTN, HLD, renal insufficiency, hypothyroidism, aortic and mitral valve disease, presented to the ED on 2/1 with left hip pain after a mechanical fall at home after which she was not able to move that hip or put weight on it the left leg. In the ED, vitals stable, labs unremarkable except INR 2.5.  Left hip x-ray showed a displaced femoral neck fracture.  Patient underwent left hip hemiarthroplasty on 02/12/19 after INR decreased to safe level to proceed with surgery.  Anticoagulation was bridged with heparin while warfarin held.  Patient did have some elevated HR with A-fib, was successfully controlled with resumption of beta blocker that had been held.  Patient is clinically improved and stable for discharge home with home health Physical Therapy, and is to follow up with Ortho in 2 weeks.   Discharge Diagnoses: Principal Problem:   Hip fracture, left ([place]) Active Problems:   Atrial fibrillation (HCC)   Chronic kidney disease (CKD), stage III (moderate)   Arteriosclerosis of coronary artery   HLD (hyperlipidemia)   Osteoporosis, post-menopausal   Hypothyroidism   Protein-calorie malnutrition, severe   Fall  Hip fracture, left  Displaced left femoral neck fracture secondary to mechanical fall. --Ortho following-s/p left hemihip arthroplasty  02/12/19 --surgery was initially held until INR came down --> Ortho gave IV vit K --pain management -mostly requiring Tylenol --bowel regimen --PT-recommend SNF Weightbearing as tolerated Follow-up with [facility] orthopedics in 2 weeks for staple removal and Steri-Strip application TED hose bilateral lower extremities x6 weeks, okay to remove at nighttime. Continue with Coumadin Tylenol and Norco for pain.  Prescription for Norco in chart.     Fever-likely postop fever. UA negative chest x-ray pulmonary edema, urine culture negative, blood culture pending- so far negative.   hold off on IV antibiotics unless have a reason to start if cx come back +. Incentive spirometer     Atrial fibrillation - rate controlled, on warfarin. --inr goal 2-3,INR is therapeutic --continue home Toprol XL      Coronary artery disease - stable, no chest pain Valvular Heart Disease - stable HFpEF - echo 2018 EF 50% --Place on telemetry since heart rate has been variable at times uncontrolled --continue spironolactone..> hold since with a/cki More euvolemic.  Resume p.o. Lasix. Was given Lasix 20 mg IV x1 and fluids were discontinued     HLD (hyperlipidemia) --continue simvastatin   Acute on Chronic kidney disease (CKD), stage III (moderate) - Stable. Due to hypotension /low vol. Status -Continue to hold Aldactone for now It has improved with IV fluid for hydration-will discontinue since patient is mildly volume overloaded Continue to monitor monitor     Osteoporosis, post-menopausal - with multiple fragility fractures including current left hip, prior right hip and multiple vertebral compression fractures.  Does not ppear to be on home medication for this. --recommend initiation of therapy ASAP, follow up with PCP --  Continue her home vitamin D vitamin D     Hypothyroidism - continue Synthroid   GERD - continue Protonix    Other - continue Doxepin    Discharge Instructions   Discharge  Instructions     Call MD for:  redness, tenderness, or signs of infection (pain, swelling, redness, odor or green/yellow discharge around incision site)   Complete by: As directed    Call MD for:  severe uncontrolled pain   Complete by: As directed    Call MD for:  temperature >100.4   Complete by: As directed    Diet - low sodium heart healthy   Complete by: As directed    Discharge instructions   Complete by: As directed    Follow up in [place] office in 2 weeks.  Where TED/compression stockings on both legs for 6 weeks.  Take Tylenol for mild pain, Norco as needed for more severe pain, methocarbamol (aka Robaxin) as needed for muscle spasms.  Participate with home physical therapy as much as possible, and do exercises between visits.   Increase activity slowly   Complete by: As directed       Allergies as of 02/18/2019       Reactions   Fosamax [alendronate Sodium] Nausea And Vomiting   Other Rash   Penicillins Nausea And Vomiting, Palpitations   Tramadol Nausea And Vomiting, Palpitations        Medication List     STOP taking these medications    Aldactone 25 MG tablet Generic drug: spironolactone   aspirin 81 MG tablet       TAKE these medications    acetaminophen 500 MG tablet Commonly known as: TYLENOL Take 1-2 tablets (500-1,000 mg total) by mouth every 8 (eight) hours as needed for mild pain or moderate pain. What changed:  how much to take when to take this reasons to take this   Cholecalciferol 50 MCG (2000 UT) Caps Take 2,000 Units by mouth daily.   doxepin 10 MG capsule Commonly known as: SINEQUAN Take 10 mg by mouth daily.   esomeprazole 40 MG capsule Commonly known as: NEXIUM Take 40 mg by mouth daily at 12 noon.   feeding supplement (ENSURE ENLIVE) Liqd Take 237 mLs by mouth 2 (two) times daily between meals.   ferrous [id number] C-folic acid capsule Commonly known as: TRINSICON / FOLTRIN Take 1  capsule by mouth 2 (two) times daily.   furosemide 20 MG tablet Commonly known as: LASIX Take 1 tablet (20 mg total) by mouth daily. Start taking on: February 19, 2019 What changed:  how much to take when to take this   HYDROcodone-acetaminophen 5-325 MG tablet Commonly known as: NORCO/VICODIN Take 0.5 tablets by mouth every 6 (six) hours as needed for moderate pain or severe pain.   levothyroxine 100 MCG tablet Commonly known as: SYNTHROID Take 100 mcg by mouth daily before breakfast.   magnesium oxide 400 MG tablet Commonly known as: MAG-OX Take 400 mg by mouth daily.   methocarbamol 750 MG tablet Commonly known as: ROBAXIN Take 1 tablet (750 mg total) by mouth every 6 (six) hours as needed for up to 10 days for muscle spasms.   metoprolol succinate 50 MG 24 hr tablet Commonly known as: TOPROL-XL Take 100 mg by mouth daily. Take with or immediately following a meal.   simvastatin 20 MG tablet Commonly known as: ZOCOR Take 20 mg by mouth daily.   warfarin 4 MG tablet Commonly known as: COUMADIN Take 4  mg by mouth daily.               Durable Medical Equipment  (From admission, onward)           Start     Ordered   02/17/19 1348  For home use only DME lightweight manual wheelchair with seat cushion  Once    Comments: Patient suffers from home hich impairs their ability to perform daily activities like ADL as she is post surgery.  issue with performing activities of daily living. A wheelchair will allow patient to safely perform daily activities. Patient is not able to propel themselves in the home using a standard weight wheelchair due to weakness and post surgery inability to ambulate. Patient can self propel in the lightweight wheelchair. Length of need until cleared by orthopedic and PT Accessories: elevating leg rests (ELRs), wheel locks, extensions and anti-tippers.Need back cushion   02/17/19 1347   02/12/19 1744  DME Walker rolling  Once    Question  Answer Comment  Walker: With 5 Inch Wheels   Patient needs a walker to treat with the following condition Status post hip hemiarthroplasty      02/12/19 1744   02/12/19 1744  DME 3 n 1  Once     02/12/19 1744   02/12/19 1744  DME Bedside commode  Once    Question:  Patient needs a bedside commode to treat with the following condition  Answer:  Status post hip hemiarthroplasty   02/12/19 1744           Follow-up Information     Duanne Guess, PA-C On 03/04/2019.   Specialties: Orthopedic Surgery, Emergency Medicine Why: @ 9:30 am Contact information: [place] [address] [phone]           Allergies  Allergen Reactions   Fosamax [Alendronate Sodium] Nausea And Vomiting   Other Rash   Penicillins Nausea And Vomiting and Palpitations   Tramadol Nausea And Vomiting and Palpitations    Consultations: Orthopedics Pharmacy for warfarin dosing    Procedures/Studies: DG Chest 1 View  Result Date: 02/15/2019 CLINICAL DATA:  Shortness of breath EXAM: CHEST  1 VIEW COMPARISON:  02/09/2019 FINDINGS: The heart size is enlarged. There are are prominent interstitial lung markings with new bilateral pleural effusions that are small to moderate size, right worse than left. There is probable atelectasis at the lung bases. There is no pneumothorax. Aortic calcifications are again noted. There is no definite acute osseous abnormality. Again noted is a severe compression fracture of the L1 vertebral body. IMPRESSION: Findings most consistent with congestive heart failure with pulmonary edema and bilateral pleural effusions, right greater than left. Electronically Signed   By: Constance Holster M.D.   On: 02/15/2019 00:12   DG Chest 1 View  Result Date: 02/09/2019 CLINICAL DATA:  Arrives with spouse who reports that patient fell at home and injured left hip. Patient C/O left hip pain. Unable to bear weight. Pain with any movement to left leg. EXAM: CHEST  1 VIEW  COMPARISON:  08/25/2014 FINDINGS: There is mild bilateral interstitial thickening likely chronic. There is no focal consolidation. There is no pleural effusion or pneumothorax. The heart and mediastinal contours are unremarkable. There is no acute osseous abnormality. There is an S-shaped curvature of the thoracolumbar spine. IMPRESSION: No active disease. Electronically Signed   By: Kathreen Devoid   On: 02/09/2019 16:47   DG HIP OPERATIVE UNILAT W OR W/O PELVIS LEFT  Result Date:  02/12/2019 CLINICAL DATA:  Left hip hemiarthroplasty EXAM: OPERATIVE LEFT HIP (WITH PELVIS IF PERFORMED) AP VIEWS TECHNIQUE: Fluoroscopic spot image(s) were submitted for interpretation post-operatively. COMPARISON:  02/09/2019 FINDINGS: A single C-arm fluoroscopic image was obtained intraoperatively and submitted for post operative interpretation. Partially visualized left hip hemiarthroplasty hardware, grossly intact. 12 seconds of fluoroscopy time was utilized. Please see the performing provider's procedural report for further detail. IMPRESSION: As above. Electronically Signed   By: Davina Poke D.O.   On: 02/12/2019 12:18   DG HIP UNILAT W OR W/O PELVIS 2-3 VIEWS LEFT  Result Date: 02/12/2019 CLINICAL DATA:  Left hip hemiarthroplasty EXAM: DG HIP (WITH OR WITHOUT PELVIS) 2-3V LEFT COMPARISON:  02/09/2019 FINDINGS: Interval postsurgical changes from left hip hemiarthroplasty. Arthroplasty components appear in their expected alignment without evidence of immediate postoperative complication. Bones appear demineralized. Expected postoperative changes within the overlying soft tissues. Prominent vascular calcifications. IMPRESSION: Interval left hip hemiarthroplasty without evidence of immediate postoperative complication. Electronically Signed   By: Davina Poke D.O.   On: 02/12/2019 12:42   DG Hip Unilat W or Wo Pelvis 2-3 Views Left  Result Date: 02/09/2019 CLINICAL DATA:  Left hip pain EXAM: DG HIP (WITH OR WITHOUT  PELVIS) 2-3V LEFT COMPARISON:  CT abdomen and pelvis from November of 2020 FINDINGS: Osteopenia with signs of previous right femoral neck ORIF with 3 lack type screws, unchanged. Old fractures about the symphysis pubis involving right pubic bone. Displaced femoral neck fracture on the left with varus deformity and superior translation of the femoral shaft relative to femoral head. Also with some potential posterior translation of the femoral shaft relative to femoral head and proximal femoral neck. IMPRESSION: Displaced left femoral neck fracture with resultant varus deformity as described. Electronically Signed   By: Zetta Bills M.D.   On: 02/09/2019 16:47    02/12/19 - left hip hemiarthroplasy   Subjective: Patient up in chair when seen this AM. Reports feeling well.  Has occasional left leg muscle spasms, requests medicine on discharge for spasms.  Otherwise not having much pain.  Denies fever, chills, chest pain, palpitations, SOB or other acute complaints.     Discharge Exam: Vitals:   02/18/19 0042 02/18/19 0757  BP: (!) 110/56 114/69  Pulse: 97 95  Resp: 16 18  Temp: 98.1 F (36.7 C) 98.5 F (36.9 C)  SpO2: 94% 94%   Vitals:   02/17/19 0746 02/17/19 1638 02/18/19 0042 02/18/19 0757  BP: (!) 135/54 (!) 113/43 (!) 110/56 114/69  Pulse: 98 (!) 101 97 95  Resp: 16 16 16 18   Temp: 98.7 F (37.1 C) 98.3 F (36.8 C) 98.1 F (36.7 C) 98.5 F (36.9 C)  TempSrc: Oral Oral Oral Oral  SpO2: 94% 95% 94% 94%  Weight:      Height:        General: Pt is alert, awake, not in acute distress Cardiovascular: RRR, S1/S2 +, no rubs, no gallops Respiratory: CTA bilaterally, no wheezing, no rhonchi Abdominal: Soft, NT, ND, bowel sounds + Extremities: no edema, no cyanosis    The results of significant diagnostics from this hospitalization (including imaging, microbiology, ancillary and laboratory) are listed below for reference.     Microbiology: Recent Results (from the past 240  hour(s))  Respiratory Panel by RT PCR (Flu A&B, Covid) - Nasopharyngeal Swab     Status: None   Collection Time: 02/09/19  5:59 PM   Specimen: Nasopharyngeal Swab  Result Value Ref Range Status   SARS Coronavirus 2 by  RT PCR NEGATIVE NEGATIVE Final    Comment: (NOTE) SARS-CoV-2 target nucleic acids are NOT DETECTED. The SARS-CoV-2 RNA is generally detectable in upper respiratoy specimens during the acute phase of infection. The lowest concentration of SARS-CoV-2 viral copies this assay can detect is 131 copies/mL. A negative result does not preclude SARS-Cov-2 infection and should not be used as the sole basis for treatment or other patient management decisions. A negative result may occur with  improper specimen collection/handling, submission of specimen other than nasopharyngeal swab, presence of viral mutation(s) within the areas targeted by this assay, and inadequate number of viral copies (<131 copies/mL). A negative result must be combined with clinical observations, patient history, and epidemiological information. The expected result is Negative. Fact Sheet for Patients:  PinkCheek.be Fact Sheet for Healthcare Providers:  GravelBags.it This test is not yet ap proved or cleared by the [place] FDA and  has been authorized for detection and/or diagnosis of SARS-CoV-2 by FDA under an Emergency Use Authorization (EUA). This EUA will remain  in effect (meaning this test can be used) for the duration of the COVID-19 declaration under Section 564(b)(1) of the Act, 21 U.S.C. section 360bbb-3(b)(1), unless the authorization is terminated or revoked sooner.    Influenza A by PCR NEGATIVE NEGATIVE Final   Influenza B by PCR NEGATIVE NEGATIVE Final    Comment: (NOTE) The Xpert Xpress SARS-CoV-2/FLU/RSV assay is intended as an aid in  the diagnosis of influenza from Nasopharyngeal swab specimens and  should not be used as  a sole basis for treatment. Nasal washings and  aspirates are unacceptable for Xpert Xpress SARS-CoV-2/FLU/RSV  testing. Fact Sheet for Patients: PinkCheek.be Fact Sheet for Healthcare Providers: GravelBags.it This test is not yet approved or cleared by the [place] FDA and  has been authorized for detection and/or diagnosis of SARS-CoV-2 by  FDA under an Emergency Use Authorization (EUA). This EUA will remain  in effect (meaning this test can be used) for the duration of the  Covid-19 declaration under Section 564(b)(1) of the Act, 21  U.S.C. section 360bbb-3(b)(1), unless the authorization is  terminated or revoked. Performed at [facility], [address], [place], [place] [address]   Surgical PCR screen     Status: None   Collection Time: 02/12/19  3:01 AM   Specimen: Nasal Mucosa; Nasal Swab  Result Value Ref Range Status   MRSA, PCR NEGATIVE NEGATIVE Final   Staphylococcus aureus NEGATIVE NEGATIVE Final    Comment: (NOTE) The Xpert SA Assay (FDA approved for NASAL specimens in patients 59 years of age and older), is one component of a comprehensive surveillance program. It is not intended to diagnose infection nor to guide or monitor treatment. Performed at [facility], [address] [place], [place] [address]   CULTURE, BLOOD (ROUTINE X 2) w Reflex to ID Panel     Status: None (Preliminary result)   Collection Time: 02/15/19 12:22 AM   Specimen: BLOOD  Result Value Ref Range Status   Specimen Description BLOOD RIGHT FOREARM  Final   Special Requests   Final    BOTTLES DRAWN AEROBIC AND ANAEROBIC Blood Culture adequate volume   Culture   Final    NO GROWTH 3 DAYS Performed at [facility], [address] [place], [place] [address]    Report Status PENDING  Incomplete  CULTURE, BLOOD (ROUTINE X 2) w Reflex to ID Panel     Status: None (Preliminary result)   Collection  Time: 02/15/19  12:22 AM   Specimen: BLOOD  Result Value Ref Range Status   Specimen Description BLOOD RIGHT HAND  Final   Special Requests   Final    BOTTLES DRAWN AEROBIC AND ANAEROBIC Blood Culture adequate volume   Culture   Final    NO GROWTH 3 DAYS Performed at [facility], [address] [place], [place] [address]    Report Status PENDING  Incomplete  Urine Culture     Status: None   Collection Time: 02/15/19  1:02 AM   Specimen: Urine, Random  Result Value Ref Range Status   Specimen Description URINE, RANDOM  Final   Special Requests   Final    NONE Performed at [facility], [address] [place], [place] [address]    Culture NO GROWTH  Final   Report Status 02/16/2019 FINAL  Final     Labs: BNP (last 3 results) Recent Labs    02/15/19 1140  BNP 119.4*   Basic Metabolic Panel: Recent Labs  Lab 02/12/19 1630 02/13/19 0612 02/14/19 0714 02/15/19 0022 02/16/19 0113  NA 137 140 137 132* 132*  K 4.3 4.5 4.1 4.0 4.2  CL 104 107 105 102 102  CO2 18* 24 24 20* 22  GLUCOSE 100* 113* 116* 134* 116*  BUN 26* 29* 21 18 21   CREATININE 1.11* 1.22* 1.21* 0.97 1.04*  CALCIUM 8.3* 7.7* 8.0* 7.7* 7.8*   Liver Function Tests: No results for input(s): AST, ALT, ALKPHOS, BILITOT, PROT, ALBUMIN in the last 168 hours. No results for input(s): LIPASE, AMYLASE in the last 168 hours. No results for input(s): AMMONIA in the last 168 hours. CBC: Recent Labs  Lab 02/12/19 1630 02/13/19 0612 02/14/19 0714 02/15/19 0022  WBC 10.3 7.7 8.5 9.1  NEUTROABS  --   --   --  6.9  HGB 12.5 10.8* 10.6* 9.4*  HCT 37.8 33.1* 32.5* 28.8*  MCV 94.7 97.6 98.2 97.0  PLT 172 169 185 195   Cardiac Enzymes: No results for input(s): CKTOTAL, CKMB, CKMBINDEX, TROPONINI in the last 168 hours. BNP: Invalid input(s): POCBNP CBG: No results for input(s): GLUCAP in the last 168 hours. D-Dimer No results for input(s): DDIMER in the last 72 hours. Hgb A1c No results  for input(s): HGBA1C in the last 72 hours. Lipid Profile No results for input(s): CHOL, HDL, LDLCALC, TRIG, CHOLHDL, LDLDIRECT in the last 72 hours. Thyroid function studies No results for input(s): TSH, T4TOTAL, T3FREE, THYROIDAB in the last 72 hours.  Invalid input(s): FREET3 Anemia work up No results for input(s): VITAMINB12, FOLATE, FERRITIN, TIBC, IRON, RETICCTPCT in the last 72 hours. Urinalysis    Component Value Date/Time   COLORURINE YELLOW (A) 02/15/2019 0102   APPEARANCEUR CLEAR (A) 02/15/2019 0102   LABSPEC 1.020 02/15/2019 0102   PHURINE 5.0 02/15/2019 0102   GLUCOSEU NEGATIVE 02/15/2019 0102   HGBUR NEGATIVE 02/15/2019 0102   BILIRUBINUR NEGATIVE 02/15/2019 0102   KETONESUR NEGATIVE 02/15/2019 0102   PROTEINUR 30 (A) 02/15/2019 0102   NITRITE NEGATIVE 02/15/2019 0102   LEUKOCYTESUR NEGATIVE 02/15/2019 0102   Sepsis Labs Invalid input(s): PROCALCITONIN,  WBC,  LACTICIDVEN Microbiology Recent Results (from the past 240 hour(s))  Respiratory Panel by RT PCR (Flu A&B, Covid) - Nasopharyngeal Swab     Status: None   Collection Time: 02/09/19  5:59 PM   Specimen: Nasopharyngeal Swab  Result Value Ref Range Status   SARS Coronavirus 2 by RT PCR NEGATIVE NEGATIVE Final    Comment: (NOTE) SARS-CoV-2 target nucleic  acids are NOT DETECTED. The SARS-CoV-2 RNA is generally detectable in upper respiratoy specimens during the acute phase of infection. The lowest concentration of SARS-CoV-2 viral copies this assay can detect is 131 copies/mL. A negative result does not preclude SARS-Cov-2 infection and should not be used as the sole basis for treatment or other patient management decisions. A negative result may occur with  improper specimen collection/handling, submission of specimen other than nasopharyngeal swab, presence of viral mutation(s) within the areas targeted by this assay, and inadequate number of viral copies (<131 copies/mL). A negative result must be combined  with clinical observations, patient history, and epidemiological information. The expected result is Negative. Fact Sheet for Patients:  PinkCheek.be Fact Sheet for Healthcare Providers:  GravelBags.it This test is not yet ap proved or cleared by the [place] FDA and  has been authorized for detection and/or diagnosis of SARS-CoV-2 by FDA under an Emergency Use Authorization (EUA). This EUA will remain  in effect (meaning this test can be used) for the duration of the COVID-19 declaration under Section 564(b)(1) of the Act, 21 U.S.C. section 360bbb-3(b)(1), unless the authorization is terminated or revoked sooner.    Influenza A by PCR NEGATIVE NEGATIVE Final   Influenza B by PCR NEGATIVE NEGATIVE Final    Comment: (NOTE) The Xpert Xpress SARS-CoV-2/FLU/RSV assay is intended as an aid in  the diagnosis of influenza from Nasopharyngeal swab specimens and  should not be used as a sole basis for treatment. Nasal washings and  aspirates are unacceptable for Xpert Xpress SARS-CoV-2/FLU/RSV  testing. Fact Sheet for Patients: PinkCheek.be Fact Sheet for Healthcare Providers: GravelBags.it This test is not yet approved or cleared by the [place] FDA and  has been authorized for detection and/or diagnosis of SARS-CoV-2 by  FDA under an Emergency Use Authorization (EUA). This EUA will remain  in effect (meaning this test can be used) for the duration of the  Covid-19 declaration under Section 564(b)(1) of the Act, 21  U.S.C. section 360bbb-3(b)(1), unless the authorization is  terminated or revoked. Performed at [facility], [address] [place], [place] [address]   Surgical PCR screen     Status: None   Collection Time: 02/12/19  3:01 AM   Specimen: Nasal Mucosa; Nasal Swab  Result Value Ref Range Status   MRSA, PCR NEGATIVE NEGATIVE Final    Staphylococcus aureus NEGATIVE NEGATIVE Final    Comment: (NOTE) The Xpert SA Assay (FDA approved for NASAL specimens in patients 65 years of age and older), is one component of a comprehensive surveillance program. It is not intended to diagnose infection nor to guide or monitor treatment. Performed at [facility], [address] [place], [place] [address]   CULTURE, BLOOD (ROUTINE X 2) w Reflex to ID Panel     Status: None (Preliminary result)   Collection Time: 02/15/19 12:22 AM   Specimen: BLOOD  Result Value Ref Range Status   Specimen Description BLOOD RIGHT FOREARM  Final   Special Requests   Final    BOTTLES DRAWN AEROBIC AND ANAEROBIC Blood Culture adequate volume   Culture   Final    NO GROWTH 3 DAYS Performed at [facility], [address] [place], [place] [address]    Report Status PENDING  Incomplete  CULTURE, BLOOD (ROUTINE X 2) w Reflex to ID Panel     Status: None (Preliminary result)   Collection Time: 02/15/19 12:22 AM   Specimen: BLOOD  Result Value Ref Range Status  Specimen Description BLOOD RIGHT HAND  Final   Special Requests   Final    BOTTLES DRAWN AEROBIC AND ANAEROBIC Blood Culture adequate volume   Culture   Final    NO GROWTH 3 DAYS Performed at [facility] [facility], [address] [place], [place] [address]    Report Status PENDING  Incomplete  Urine Culture     Status: None   Collection Time: 02/15/19  1:02 AM   Specimen: Urine, Random  Result Value Ref Range Status   Specimen Description URINE, RANDOM  Final   Special Requests   Final    NONE Performed at [facility], [address] [place], [place] [address]    Culture NO GROWTH  Final   Report Status 02/16/2019 FINAL  Final     Time coordinating discharge: Over 30 minutes  SIGNED:   Ezekiel Slocumb, DO [facility] 02/18/2019, 12:48 PM   If 7PM-7AM, please contact night-coverage [email or web address]

## 2019-02-20 DIAGNOSIS — E785 Hyperlipidemia, unspecified: Secondary | ICD-10-CM | POA: Diagnosis not present

## 2019-02-20 DIAGNOSIS — Z7901 Long term (current) use of anticoagulants: Secondary | ICD-10-CM | POA: Diagnosis not present

## 2019-02-20 DIAGNOSIS — Z9181 History of falling: Secondary | ICD-10-CM | POA: Diagnosis not present

## 2019-02-20 DIAGNOSIS — I444 Left anterior fascicular block: Secondary | ICD-10-CM | POA: Diagnosis not present

## 2019-02-20 DIAGNOSIS — I4891 Unspecified atrial fibrillation: Secondary | ICD-10-CM | POA: Diagnosis not present

## 2019-02-20 DIAGNOSIS — G8929 Other chronic pain: Secondary | ICD-10-CM | POA: Diagnosis not present

## 2019-02-20 DIAGNOSIS — S72002D Fracture of unspecified part of neck of left femur, subsequent encounter for closed fracture with routine healing: Secondary | ICD-10-CM | POA: Diagnosis not present

## 2019-02-20 DIAGNOSIS — M810 Age-related osteoporosis without current pathological fracture: Secondary | ICD-10-CM | POA: Diagnosis not present

## 2019-02-20 DIAGNOSIS — I509 Heart failure, unspecified: Secondary | ICD-10-CM | POA: Diagnosis not present

## 2019-02-20 DIAGNOSIS — I080 Rheumatic disorders of both mitral and aortic valves: Secondary | ICD-10-CM | POA: Diagnosis not present

## 2019-02-20 DIAGNOSIS — D519 Vitamin B12 deficiency anemia, unspecified: Secondary | ICD-10-CM | POA: Diagnosis not present

## 2019-02-20 DIAGNOSIS — E43 Unspecified severe protein-calorie malnutrition: Secondary | ICD-10-CM | POA: Diagnosis not present

## 2019-02-20 DIAGNOSIS — I2510 Atherosclerotic heart disease of native coronary artery without angina pectoris: Secondary | ICD-10-CM | POA: Diagnosis not present

## 2019-02-20 DIAGNOSIS — M47816 Spondylosis without myelopathy or radiculopathy, lumbar region: Secondary | ICD-10-CM | POA: Diagnosis not present

## 2019-02-20 DIAGNOSIS — E039 Hypothyroidism, unspecified: Secondary | ICD-10-CM | POA: Diagnosis not present

## 2019-02-20 DIAGNOSIS — N183 Chronic kidney disease, stage 3 unspecified: Secondary | ICD-10-CM | POA: Diagnosis not present

## 2019-02-20 DIAGNOSIS — Z79891 Long term (current) use of opiate analgesic: Secondary | ICD-10-CM | POA: Diagnosis not present

## 2019-02-20 DIAGNOSIS — I130 Hypertensive heart and chronic kidney disease with heart failure and stage 1 through stage 4 chronic kidney disease, or unspecified chronic kidney disease: Secondary | ICD-10-CM | POA: Diagnosis not present

## 2019-02-20 DIAGNOSIS — Z96642 Presence of left artificial hip joint: Secondary | ICD-10-CM | POA: Diagnosis not present

## 2019-02-20 DIAGNOSIS — I110 Hypertensive heart disease with heart failure: Secondary | ICD-10-CM | POA: Diagnosis not present

## 2019-02-20 LAB — CULTURE, BLOOD (ROUTINE X 2)
Culture: NO GROWTH
Culture: NO GROWTH
Special Requests: ADEQUATE
Special Requests: ADEQUATE

## 2019-02-24 DIAGNOSIS — Z7901 Long term (current) use of anticoagulants: Secondary | ICD-10-CM | POA: Diagnosis not present

## 2019-02-24 DIAGNOSIS — I509 Heart failure, unspecified: Secondary | ICD-10-CM | POA: Diagnosis not present

## 2019-02-24 DIAGNOSIS — N183 Chronic kidney disease, stage 3 unspecified: Secondary | ICD-10-CM | POA: Diagnosis not present

## 2019-02-24 DIAGNOSIS — M47816 Spondylosis without myelopathy or radiculopathy, lumbar region: Secondary | ICD-10-CM | POA: Diagnosis not present

## 2019-02-24 DIAGNOSIS — I4891 Unspecified atrial fibrillation: Secondary | ICD-10-CM | POA: Diagnosis not present

## 2019-02-24 DIAGNOSIS — G8929 Other chronic pain: Secondary | ICD-10-CM | POA: Diagnosis not present

## 2019-02-24 DIAGNOSIS — E43 Unspecified severe protein-calorie malnutrition: Secondary | ICD-10-CM | POA: Diagnosis not present

## 2019-02-24 DIAGNOSIS — Z79891 Long term (current) use of opiate analgesic: Secondary | ICD-10-CM | POA: Diagnosis not present

## 2019-02-24 DIAGNOSIS — I110 Hypertensive heart disease with heart failure: Secondary | ICD-10-CM | POA: Diagnosis not present

## 2019-02-24 DIAGNOSIS — Z9181 History of falling: Secondary | ICD-10-CM | POA: Diagnosis not present

## 2019-02-24 DIAGNOSIS — I130 Hypertensive heart and chronic kidney disease with heart failure and stage 1 through stage 4 chronic kidney disease, or unspecified chronic kidney disease: Secondary | ICD-10-CM | POA: Diagnosis not present

## 2019-02-24 DIAGNOSIS — I2510 Atherosclerotic heart disease of native coronary artery without angina pectoris: Secondary | ICD-10-CM | POA: Diagnosis not present

## 2019-02-24 DIAGNOSIS — S72002D Fracture of unspecified part of neck of left femur, subsequent encounter for closed fracture with routine healing: Secondary | ICD-10-CM | POA: Diagnosis not present

## 2019-02-24 DIAGNOSIS — E039 Hypothyroidism, unspecified: Secondary | ICD-10-CM | POA: Diagnosis not present

## 2019-02-24 DIAGNOSIS — I080 Rheumatic disorders of both mitral and aortic valves: Secondary | ICD-10-CM | POA: Diagnosis not present

## 2019-02-24 DIAGNOSIS — Z96642 Presence of left artificial hip joint: Secondary | ICD-10-CM | POA: Diagnosis not present

## 2019-02-24 DIAGNOSIS — I444 Left anterior fascicular block: Secondary | ICD-10-CM | POA: Diagnosis not present

## 2019-02-24 DIAGNOSIS — D519 Vitamin B12 deficiency anemia, unspecified: Secondary | ICD-10-CM | POA: Diagnosis not present

## 2019-02-24 DIAGNOSIS — E785 Hyperlipidemia, unspecified: Secondary | ICD-10-CM | POA: Diagnosis not present

## 2019-02-24 DIAGNOSIS — M810 Age-related osteoporosis without current pathological fracture: Secondary | ICD-10-CM | POA: Diagnosis not present

## 2019-02-25 DIAGNOSIS — I130 Hypertensive heart and chronic kidney disease with heart failure and stage 1 through stage 4 chronic kidney disease, or unspecified chronic kidney disease: Secondary | ICD-10-CM | POA: Diagnosis not present

## 2019-02-25 DIAGNOSIS — I110 Hypertensive heart disease with heart failure: Secondary | ICD-10-CM | POA: Diagnosis not present

## 2019-02-25 DIAGNOSIS — D519 Vitamin B12 deficiency anemia, unspecified: Secondary | ICD-10-CM | POA: Diagnosis not present

## 2019-02-25 DIAGNOSIS — M810 Age-related osteoporosis without current pathological fracture: Secondary | ICD-10-CM | POA: Diagnosis not present

## 2019-02-25 DIAGNOSIS — G8929 Other chronic pain: Secondary | ICD-10-CM | POA: Diagnosis not present

## 2019-02-25 DIAGNOSIS — Z7901 Long term (current) use of anticoagulants: Secondary | ICD-10-CM | POA: Diagnosis not present

## 2019-02-25 DIAGNOSIS — M47816 Spondylosis without myelopathy or radiculopathy, lumbar region: Secondary | ICD-10-CM | POA: Diagnosis not present

## 2019-02-25 DIAGNOSIS — S72002D Fracture of unspecified part of neck of left femur, subsequent encounter for closed fracture with routine healing: Secondary | ICD-10-CM | POA: Diagnosis not present

## 2019-02-25 DIAGNOSIS — Z96642 Presence of left artificial hip joint: Secondary | ICD-10-CM | POA: Diagnosis not present

## 2019-02-25 DIAGNOSIS — I509 Heart failure, unspecified: Secondary | ICD-10-CM | POA: Diagnosis not present

## 2019-02-25 DIAGNOSIS — Z79891 Long term (current) use of opiate analgesic: Secondary | ICD-10-CM | POA: Diagnosis not present

## 2019-02-25 DIAGNOSIS — I2510 Atherosclerotic heart disease of native coronary artery without angina pectoris: Secondary | ICD-10-CM | POA: Diagnosis not present

## 2019-02-25 DIAGNOSIS — E785 Hyperlipidemia, unspecified: Secondary | ICD-10-CM | POA: Diagnosis not present

## 2019-02-25 DIAGNOSIS — I080 Rheumatic disorders of both mitral and aortic valves: Secondary | ICD-10-CM | POA: Diagnosis not present

## 2019-02-25 DIAGNOSIS — I4891 Unspecified atrial fibrillation: Secondary | ICD-10-CM | POA: Diagnosis not present

## 2019-02-25 DIAGNOSIS — N183 Chronic kidney disease, stage 3 unspecified: Secondary | ICD-10-CM | POA: Diagnosis not present

## 2019-02-25 DIAGNOSIS — Z9181 History of falling: Secondary | ICD-10-CM | POA: Diagnosis not present

## 2019-02-25 DIAGNOSIS — E039 Hypothyroidism, unspecified: Secondary | ICD-10-CM | POA: Diagnosis not present

## 2019-02-25 DIAGNOSIS — S72002A Fracture of unspecified part of neck of left femur, initial encounter for closed fracture: Secondary | ICD-10-CM | POA: Diagnosis not present

## 2019-02-25 DIAGNOSIS — I444 Left anterior fascicular block: Secondary | ICD-10-CM | POA: Diagnosis not present

## 2019-02-25 DIAGNOSIS — E43 Unspecified severe protein-calorie malnutrition: Secondary | ICD-10-CM | POA: Diagnosis not present

## 2019-02-28 DIAGNOSIS — I110 Hypertensive heart disease with heart failure: Secondary | ICD-10-CM | POA: Diagnosis not present

## 2019-02-28 DIAGNOSIS — N183 Chronic kidney disease, stage 3 unspecified: Secondary | ICD-10-CM | POA: Diagnosis not present

## 2019-02-28 DIAGNOSIS — I080 Rheumatic disorders of both mitral and aortic valves: Secondary | ICD-10-CM | POA: Diagnosis not present

## 2019-02-28 DIAGNOSIS — M47816 Spondylosis without myelopathy or radiculopathy, lumbar region: Secondary | ICD-10-CM | POA: Diagnosis not present

## 2019-02-28 DIAGNOSIS — I4891 Unspecified atrial fibrillation: Secondary | ICD-10-CM | POA: Diagnosis not present

## 2019-02-28 DIAGNOSIS — Z96642 Presence of left artificial hip joint: Secondary | ICD-10-CM | POA: Diagnosis not present

## 2019-02-28 DIAGNOSIS — I444 Left anterior fascicular block: Secondary | ICD-10-CM | POA: Diagnosis not present

## 2019-02-28 DIAGNOSIS — G8929 Other chronic pain: Secondary | ICD-10-CM | POA: Diagnosis not present

## 2019-02-28 DIAGNOSIS — D519 Vitamin B12 deficiency anemia, unspecified: Secondary | ICD-10-CM | POA: Diagnosis not present

## 2019-02-28 DIAGNOSIS — I2510 Atherosclerotic heart disease of native coronary artery without angina pectoris: Secondary | ICD-10-CM | POA: Diagnosis not present

## 2019-02-28 DIAGNOSIS — E039 Hypothyroidism, unspecified: Secondary | ICD-10-CM | POA: Diagnosis not present

## 2019-02-28 DIAGNOSIS — I130 Hypertensive heart and chronic kidney disease with heart failure and stage 1 through stage 4 chronic kidney disease, or unspecified chronic kidney disease: Secondary | ICD-10-CM | POA: Diagnosis not present

## 2019-02-28 DIAGNOSIS — E785 Hyperlipidemia, unspecified: Secondary | ICD-10-CM | POA: Diagnosis not present

## 2019-02-28 DIAGNOSIS — Z79891 Long term (current) use of opiate analgesic: Secondary | ICD-10-CM | POA: Diagnosis not present

## 2019-02-28 DIAGNOSIS — Z7901 Long term (current) use of anticoagulants: Secondary | ICD-10-CM | POA: Diagnosis not present

## 2019-02-28 DIAGNOSIS — E43 Unspecified severe protein-calorie malnutrition: Secondary | ICD-10-CM | POA: Diagnosis not present

## 2019-02-28 DIAGNOSIS — I509 Heart failure, unspecified: Secondary | ICD-10-CM | POA: Diagnosis not present

## 2019-02-28 DIAGNOSIS — Z9181 History of falling: Secondary | ICD-10-CM | POA: Diagnosis not present

## 2019-02-28 DIAGNOSIS — S72002D Fracture of unspecified part of neck of left femur, subsequent encounter for closed fracture with routine healing: Secondary | ICD-10-CM | POA: Diagnosis not present

## 2019-02-28 DIAGNOSIS — M810 Age-related osteoporosis without current pathological fracture: Secondary | ICD-10-CM | POA: Diagnosis not present

## 2019-03-03 DIAGNOSIS — I130 Hypertensive heart and chronic kidney disease with heart failure and stage 1 through stage 4 chronic kidney disease, or unspecified chronic kidney disease: Secondary | ICD-10-CM | POA: Diagnosis not present

## 2019-03-03 DIAGNOSIS — I2510 Atherosclerotic heart disease of native coronary artery without angina pectoris: Secondary | ICD-10-CM | POA: Diagnosis not present

## 2019-03-03 DIAGNOSIS — I509 Heart failure, unspecified: Secondary | ICD-10-CM | POA: Diagnosis not present

## 2019-03-03 DIAGNOSIS — S72002D Fracture of unspecified part of neck of left femur, subsequent encounter for closed fracture with routine healing: Secondary | ICD-10-CM | POA: Diagnosis not present

## 2019-03-03 DIAGNOSIS — M47816 Spondylosis without myelopathy or radiculopathy, lumbar region: Secondary | ICD-10-CM | POA: Diagnosis not present

## 2019-03-03 DIAGNOSIS — I4891 Unspecified atrial fibrillation: Secondary | ICD-10-CM | POA: Diagnosis not present

## 2019-03-03 DIAGNOSIS — N183 Chronic kidney disease, stage 3 unspecified: Secondary | ICD-10-CM | POA: Diagnosis not present

## 2019-03-05 ENCOUNTER — Ambulatory Visit: Payer: PPO | Attending: Internal Medicine

## 2019-03-05 DIAGNOSIS — Z96642 Presence of left artificial hip joint: Secondary | ICD-10-CM | POA: Diagnosis not present

## 2019-03-05 DIAGNOSIS — I080 Rheumatic disorders of both mitral and aortic valves: Secondary | ICD-10-CM | POA: Diagnosis not present

## 2019-03-05 DIAGNOSIS — M810 Age-related osteoporosis without current pathological fracture: Secondary | ICD-10-CM | POA: Diagnosis not present

## 2019-03-05 DIAGNOSIS — E785 Hyperlipidemia, unspecified: Secondary | ICD-10-CM | POA: Diagnosis not present

## 2019-03-05 DIAGNOSIS — Z7901 Long term (current) use of anticoagulants: Secondary | ICD-10-CM | POA: Diagnosis not present

## 2019-03-05 DIAGNOSIS — Z79891 Long term (current) use of opiate analgesic: Secondary | ICD-10-CM | POA: Diagnosis not present

## 2019-03-05 DIAGNOSIS — S72002D Fracture of unspecified part of neck of left femur, subsequent encounter for closed fracture with routine healing: Secondary | ICD-10-CM | POA: Diagnosis not present

## 2019-03-05 DIAGNOSIS — M47816 Spondylosis without myelopathy or radiculopathy, lumbar region: Secondary | ICD-10-CM | POA: Diagnosis not present

## 2019-03-05 DIAGNOSIS — E43 Unspecified severe protein-calorie malnutrition: Secondary | ICD-10-CM | POA: Diagnosis not present

## 2019-03-05 DIAGNOSIS — Z9181 History of falling: Secondary | ICD-10-CM | POA: Diagnosis not present

## 2019-03-05 DIAGNOSIS — D519 Vitamin B12 deficiency anemia, unspecified: Secondary | ICD-10-CM | POA: Diagnosis not present

## 2019-03-05 DIAGNOSIS — G8929 Other chronic pain: Secondary | ICD-10-CM | POA: Diagnosis not present

## 2019-03-05 DIAGNOSIS — I110 Hypertensive heart disease with heart failure: Secondary | ICD-10-CM | POA: Diagnosis not present

## 2019-03-05 DIAGNOSIS — I509 Heart failure, unspecified: Secondary | ICD-10-CM | POA: Diagnosis not present

## 2019-03-05 DIAGNOSIS — N183 Chronic kidney disease, stage 3 unspecified: Secondary | ICD-10-CM | POA: Diagnosis not present

## 2019-03-05 DIAGNOSIS — I444 Left anterior fascicular block: Secondary | ICD-10-CM | POA: Diagnosis not present

## 2019-03-05 DIAGNOSIS — E039 Hypothyroidism, unspecified: Secondary | ICD-10-CM | POA: Diagnosis not present

## 2019-03-05 DIAGNOSIS — I130 Hypertensive heart and chronic kidney disease with heart failure and stage 1 through stage 4 chronic kidney disease, or unspecified chronic kidney disease: Secondary | ICD-10-CM | POA: Diagnosis not present

## 2019-03-05 DIAGNOSIS — Z23 Encounter for immunization: Secondary | ICD-10-CM | POA: Insufficient documentation

## 2019-03-05 DIAGNOSIS — I2510 Atherosclerotic heart disease of native coronary artery without angina pectoris: Secondary | ICD-10-CM | POA: Diagnosis not present

## 2019-03-05 DIAGNOSIS — I4891 Unspecified atrial fibrillation: Secondary | ICD-10-CM | POA: Diagnosis not present

## 2019-03-05 NOTE — Progress Notes (Signed)
   Covid-19 Vaccination Clinic  Name:  Elizabeth Fields    MRN: [id number] DOB: [birth year]  03/05/2019  [name] was observed post Covid-19 immunization for 15 minutes without incidence. She was provided with Vaccine Information Sheet and instruction to access the V-Safe system.   [name] was instructed to call 911 with any severe reactions post vaccine: Marland Kitchen Difficulty breathing  . Swelling of your face and throat  . A fast heartbeat  . A bad rash all over your body  . Dizziness and weakness    Immunizations Administered    Name Date Dose VIS Date Route   Pfizer COVID-19 Vaccine 03/05/2019 12:09 PM 0.3 mL 12/19/2018 Intramuscular   Manufacturer: [place]   Lot: [id number]   [place]: [id number]

## 2019-03-06 DIAGNOSIS — N183 Chronic kidney disease, stage 3 unspecified: Secondary | ICD-10-CM | POA: Diagnosis not present

## 2019-03-06 DIAGNOSIS — M810 Age-related osteoporosis without current pathological fracture: Secondary | ICD-10-CM | POA: Diagnosis not present

## 2019-03-06 DIAGNOSIS — Z79891 Long term (current) use of opiate analgesic: Secondary | ICD-10-CM | POA: Diagnosis not present

## 2019-03-06 DIAGNOSIS — I110 Hypertensive heart disease with heart failure: Secondary | ICD-10-CM | POA: Diagnosis not present

## 2019-03-06 DIAGNOSIS — E039 Hypothyroidism, unspecified: Secondary | ICD-10-CM | POA: Diagnosis not present

## 2019-03-06 DIAGNOSIS — I509 Heart failure, unspecified: Secondary | ICD-10-CM | POA: Diagnosis not present

## 2019-03-06 DIAGNOSIS — G8929 Other chronic pain: Secondary | ICD-10-CM | POA: Diagnosis not present

## 2019-03-06 DIAGNOSIS — M47816 Spondylosis without myelopathy or radiculopathy, lumbar region: Secondary | ICD-10-CM | POA: Diagnosis not present

## 2019-03-06 DIAGNOSIS — E43 Unspecified severe protein-calorie malnutrition: Secondary | ICD-10-CM | POA: Diagnosis not present

## 2019-03-06 DIAGNOSIS — I444 Left anterior fascicular block: Secondary | ICD-10-CM | POA: Diagnosis not present

## 2019-03-06 DIAGNOSIS — I080 Rheumatic disorders of both mitral and aortic valves: Secondary | ICD-10-CM | POA: Diagnosis not present

## 2019-03-06 DIAGNOSIS — I130 Hypertensive heart and chronic kidney disease with heart failure and stage 1 through stage 4 chronic kidney disease, or unspecified chronic kidney disease: Secondary | ICD-10-CM | POA: Diagnosis not present

## 2019-03-06 DIAGNOSIS — E785 Hyperlipidemia, unspecified: Secondary | ICD-10-CM | POA: Diagnosis not present

## 2019-03-06 DIAGNOSIS — Z9181 History of falling: Secondary | ICD-10-CM | POA: Diagnosis not present

## 2019-03-06 DIAGNOSIS — I2510 Atherosclerotic heart disease of native coronary artery without angina pectoris: Secondary | ICD-10-CM | POA: Diagnosis not present

## 2019-03-06 DIAGNOSIS — Z96642 Presence of left artificial hip joint: Secondary | ICD-10-CM | POA: Diagnosis not present

## 2019-03-06 DIAGNOSIS — I4891 Unspecified atrial fibrillation: Secondary | ICD-10-CM | POA: Diagnosis not present

## 2019-03-06 DIAGNOSIS — S72002D Fracture of unspecified part of neck of left femur, subsequent encounter for closed fracture with routine healing: Secondary | ICD-10-CM | POA: Diagnosis not present

## 2019-03-06 DIAGNOSIS — D519 Vitamin B12 deficiency anemia, unspecified: Secondary | ICD-10-CM | POA: Diagnosis not present

## 2019-03-06 DIAGNOSIS — Z7901 Long term (current) use of anticoagulants: Secondary | ICD-10-CM | POA: Diagnosis not present

## 2019-03-09 DIAGNOSIS — I444 Left anterior fascicular block: Secondary | ICD-10-CM | POA: Diagnosis not present

## 2019-03-09 DIAGNOSIS — Z79891 Long term (current) use of opiate analgesic: Secondary | ICD-10-CM | POA: Diagnosis not present

## 2019-03-09 DIAGNOSIS — S72002D Fracture of unspecified part of neck of left femur, subsequent encounter for closed fracture with routine healing: Secondary | ICD-10-CM | POA: Diagnosis not present

## 2019-03-09 DIAGNOSIS — G8929 Other chronic pain: Secondary | ICD-10-CM | POA: Diagnosis not present

## 2019-03-09 DIAGNOSIS — I509 Heart failure, unspecified: Secondary | ICD-10-CM | POA: Diagnosis not present

## 2019-03-09 DIAGNOSIS — E039 Hypothyroidism, unspecified: Secondary | ICD-10-CM | POA: Diagnosis not present

## 2019-03-09 DIAGNOSIS — Z9181 History of falling: Secondary | ICD-10-CM | POA: Diagnosis not present

## 2019-03-09 DIAGNOSIS — E43 Unspecified severe protein-calorie malnutrition: Secondary | ICD-10-CM | POA: Diagnosis not present

## 2019-03-09 DIAGNOSIS — N183 Chronic kidney disease, stage 3 unspecified: Secondary | ICD-10-CM | POA: Diagnosis not present

## 2019-03-09 DIAGNOSIS — M47816 Spondylosis without myelopathy or radiculopathy, lumbar region: Secondary | ICD-10-CM | POA: Diagnosis not present

## 2019-03-09 DIAGNOSIS — M810 Age-related osteoporosis without current pathological fracture: Secondary | ICD-10-CM | POA: Diagnosis not present

## 2019-03-09 DIAGNOSIS — Z96642 Presence of left artificial hip joint: Secondary | ICD-10-CM | POA: Diagnosis not present

## 2019-03-09 DIAGNOSIS — I130 Hypertensive heart and chronic kidney disease with heart failure and stage 1 through stage 4 chronic kidney disease, or unspecified chronic kidney disease: Secondary | ICD-10-CM | POA: Diagnosis not present

## 2019-03-09 DIAGNOSIS — I4891 Unspecified atrial fibrillation: Secondary | ICD-10-CM | POA: Diagnosis not present

## 2019-03-09 DIAGNOSIS — E785 Hyperlipidemia, unspecified: Secondary | ICD-10-CM | POA: Diagnosis not present

## 2019-03-09 DIAGNOSIS — I080 Rheumatic disorders of both mitral and aortic valves: Secondary | ICD-10-CM | POA: Diagnosis not present

## 2019-03-09 DIAGNOSIS — Z7901 Long term (current) use of anticoagulants: Secondary | ICD-10-CM | POA: Diagnosis not present

## 2019-03-09 DIAGNOSIS — I2510 Atherosclerotic heart disease of native coronary artery without angina pectoris: Secondary | ICD-10-CM | POA: Diagnosis not present

## 2019-03-09 DIAGNOSIS — I110 Hypertensive heart disease with heart failure: Secondary | ICD-10-CM | POA: Diagnosis not present

## 2019-03-09 DIAGNOSIS — D519 Vitamin B12 deficiency anemia, unspecified: Secondary | ICD-10-CM | POA: Diagnosis not present

## 2019-03-10 DIAGNOSIS — E785 Hyperlipidemia, unspecified: Secondary | ICD-10-CM | POA: Diagnosis not present

## 2019-03-10 DIAGNOSIS — Z9181 History of falling: Secondary | ICD-10-CM | POA: Diagnosis not present

## 2019-03-10 DIAGNOSIS — I509 Heart failure, unspecified: Secondary | ICD-10-CM | POA: Diagnosis not present

## 2019-03-10 DIAGNOSIS — I2510 Atherosclerotic heart disease of native coronary artery without angina pectoris: Secondary | ICD-10-CM | POA: Diagnosis not present

## 2019-03-10 DIAGNOSIS — Z96642 Presence of left artificial hip joint: Secondary | ICD-10-CM | POA: Diagnosis not present

## 2019-03-10 DIAGNOSIS — I130 Hypertensive heart and chronic kidney disease with heart failure and stage 1 through stage 4 chronic kidney disease, or unspecified chronic kidney disease: Secondary | ICD-10-CM | POA: Diagnosis not present

## 2019-03-10 DIAGNOSIS — I110 Hypertensive heart disease with heart failure: Secondary | ICD-10-CM | POA: Diagnosis not present

## 2019-03-10 DIAGNOSIS — D519 Vitamin B12 deficiency anemia, unspecified: Secondary | ICD-10-CM | POA: Diagnosis not present

## 2019-03-10 DIAGNOSIS — I080 Rheumatic disorders of both mitral and aortic valves: Secondary | ICD-10-CM | POA: Diagnosis not present

## 2019-03-10 DIAGNOSIS — Z79891 Long term (current) use of opiate analgesic: Secondary | ICD-10-CM | POA: Diagnosis not present

## 2019-03-10 DIAGNOSIS — N183 Chronic kidney disease, stage 3 unspecified: Secondary | ICD-10-CM | POA: Diagnosis not present

## 2019-03-10 DIAGNOSIS — Z7901 Long term (current) use of anticoagulants: Secondary | ICD-10-CM | POA: Diagnosis not present

## 2019-03-10 DIAGNOSIS — E43 Unspecified severe protein-calorie malnutrition: Secondary | ICD-10-CM | POA: Diagnosis not present

## 2019-03-10 DIAGNOSIS — M810 Age-related osteoporosis without current pathological fracture: Secondary | ICD-10-CM | POA: Diagnosis not present

## 2019-03-10 DIAGNOSIS — M47816 Spondylosis without myelopathy or radiculopathy, lumbar region: Secondary | ICD-10-CM | POA: Diagnosis not present

## 2019-03-10 DIAGNOSIS — I444 Left anterior fascicular block: Secondary | ICD-10-CM | POA: Diagnosis not present

## 2019-03-10 DIAGNOSIS — G8929 Other chronic pain: Secondary | ICD-10-CM | POA: Diagnosis not present

## 2019-03-10 DIAGNOSIS — E039 Hypothyroidism, unspecified: Secondary | ICD-10-CM | POA: Diagnosis not present

## 2019-03-10 DIAGNOSIS — I4891 Unspecified atrial fibrillation: Secondary | ICD-10-CM | POA: Diagnosis not present

## 2019-03-10 DIAGNOSIS — S72002D Fracture of unspecified part of neck of left femur, subsequent encounter for closed fracture with routine healing: Secondary | ICD-10-CM | POA: Diagnosis not present

## 2019-03-16 DIAGNOSIS — I444 Left anterior fascicular block: Secondary | ICD-10-CM | POA: Diagnosis not present

## 2019-03-16 DIAGNOSIS — I080 Rheumatic disorders of both mitral and aortic valves: Secondary | ICD-10-CM | POA: Diagnosis not present

## 2019-03-16 DIAGNOSIS — I2510 Atherosclerotic heart disease of native coronary artery without angina pectoris: Secondary | ICD-10-CM | POA: Diagnosis not present

## 2019-03-16 DIAGNOSIS — Z96642 Presence of left artificial hip joint: Secondary | ICD-10-CM | POA: Diagnosis not present

## 2019-03-16 DIAGNOSIS — M47816 Spondylosis without myelopathy or radiculopathy, lumbar region: Secondary | ICD-10-CM | POA: Diagnosis not present

## 2019-03-16 DIAGNOSIS — I509 Heart failure, unspecified: Secondary | ICD-10-CM | POA: Diagnosis not present

## 2019-03-16 DIAGNOSIS — Z79891 Long term (current) use of opiate analgesic: Secondary | ICD-10-CM | POA: Diagnosis not present

## 2019-03-16 DIAGNOSIS — E43 Unspecified severe protein-calorie malnutrition: Secondary | ICD-10-CM | POA: Diagnosis not present

## 2019-03-16 DIAGNOSIS — Z7901 Long term (current) use of anticoagulants: Secondary | ICD-10-CM | POA: Diagnosis not present

## 2019-03-16 DIAGNOSIS — I110 Hypertensive heart disease with heart failure: Secondary | ICD-10-CM | POA: Diagnosis not present

## 2019-03-16 DIAGNOSIS — D519 Vitamin B12 deficiency anemia, unspecified: Secondary | ICD-10-CM | POA: Diagnosis not present

## 2019-03-16 DIAGNOSIS — E039 Hypothyroidism, unspecified: Secondary | ICD-10-CM | POA: Diagnosis not present

## 2019-03-16 DIAGNOSIS — Z9181 History of falling: Secondary | ICD-10-CM | POA: Diagnosis not present

## 2019-03-16 DIAGNOSIS — S72002D Fracture of unspecified part of neck of left femur, subsequent encounter for closed fracture with routine healing: Secondary | ICD-10-CM | POA: Diagnosis not present

## 2019-03-16 DIAGNOSIS — N183 Chronic kidney disease, stage 3 unspecified: Secondary | ICD-10-CM | POA: Diagnosis not present

## 2019-03-16 DIAGNOSIS — E785 Hyperlipidemia, unspecified: Secondary | ICD-10-CM | POA: Diagnosis not present

## 2019-03-16 DIAGNOSIS — I130 Hypertensive heart and chronic kidney disease with heart failure and stage 1 through stage 4 chronic kidney disease, or unspecified chronic kidney disease: Secondary | ICD-10-CM | POA: Diagnosis not present

## 2019-03-16 DIAGNOSIS — I4891 Unspecified atrial fibrillation: Secondary | ICD-10-CM | POA: Diagnosis not present

## 2019-03-16 DIAGNOSIS — G8929 Other chronic pain: Secondary | ICD-10-CM | POA: Diagnosis not present

## 2019-03-16 DIAGNOSIS — M810 Age-related osteoporosis without current pathological fracture: Secondary | ICD-10-CM | POA: Diagnosis not present

## 2019-03-18 DIAGNOSIS — I480 Paroxysmal atrial fibrillation: Secondary | ICD-10-CM | POA: Diagnosis not present

## 2019-03-18 DIAGNOSIS — N1832 Chronic kidney disease, stage 3b: Secondary | ICD-10-CM | POA: Diagnosis not present

## 2019-03-18 DIAGNOSIS — Z8781 Personal history of (healed) traumatic fracture: Secondary | ICD-10-CM | POA: Diagnosis not present

## 2019-03-18 DIAGNOSIS — I2510 Atherosclerotic heart disease of native coronary artery without angina pectoris: Secondary | ICD-10-CM | POA: Diagnosis not present

## 2019-03-18 DIAGNOSIS — I351 Nonrheumatic aortic (valve) insufficiency: Secondary | ICD-10-CM | POA: Diagnosis not present

## 2019-03-18 DIAGNOSIS — E538 Deficiency of other specified B group vitamins: Secondary | ICD-10-CM | POA: Diagnosis not present

## 2019-03-18 DIAGNOSIS — E034 Atrophy of thyroid (acquired): Secondary | ICD-10-CM | POA: Diagnosis not present

## 2019-03-19 DIAGNOSIS — D519 Vitamin B12 deficiency anemia, unspecified: Secondary | ICD-10-CM | POA: Diagnosis not present

## 2019-03-19 DIAGNOSIS — Z9181 History of falling: Secondary | ICD-10-CM | POA: Diagnosis not present

## 2019-03-19 DIAGNOSIS — M810 Age-related osteoporosis without current pathological fracture: Secondary | ICD-10-CM | POA: Diagnosis not present

## 2019-03-19 DIAGNOSIS — I2510 Atherosclerotic heart disease of native coronary artery without angina pectoris: Secondary | ICD-10-CM | POA: Diagnosis not present

## 2019-03-19 DIAGNOSIS — Z7901 Long term (current) use of anticoagulants: Secondary | ICD-10-CM | POA: Diagnosis not present

## 2019-03-19 DIAGNOSIS — I4891 Unspecified atrial fibrillation: Secondary | ICD-10-CM | POA: Diagnosis not present

## 2019-03-19 DIAGNOSIS — E039 Hypothyroidism, unspecified: Secondary | ICD-10-CM | POA: Diagnosis not present

## 2019-03-19 DIAGNOSIS — I110 Hypertensive heart disease with heart failure: Secondary | ICD-10-CM | POA: Diagnosis not present

## 2019-03-19 DIAGNOSIS — E785 Hyperlipidemia, unspecified: Secondary | ICD-10-CM | POA: Diagnosis not present

## 2019-03-19 DIAGNOSIS — N183 Chronic kidney disease, stage 3 unspecified: Secondary | ICD-10-CM | POA: Diagnosis not present

## 2019-03-19 DIAGNOSIS — Z79891 Long term (current) use of opiate analgesic: Secondary | ICD-10-CM | POA: Diagnosis not present

## 2019-03-19 DIAGNOSIS — G8929 Other chronic pain: Secondary | ICD-10-CM | POA: Diagnosis not present

## 2019-03-19 DIAGNOSIS — I080 Rheumatic disorders of both mitral and aortic valves: Secondary | ICD-10-CM | POA: Diagnosis not present

## 2019-03-19 DIAGNOSIS — I509 Heart failure, unspecified: Secondary | ICD-10-CM | POA: Diagnosis not present

## 2019-03-19 DIAGNOSIS — E43 Unspecified severe protein-calorie malnutrition: Secondary | ICD-10-CM | POA: Diagnosis not present

## 2019-03-19 DIAGNOSIS — S72002D Fracture of unspecified part of neck of left femur, subsequent encounter for closed fracture with routine healing: Secondary | ICD-10-CM | POA: Diagnosis not present

## 2019-03-19 DIAGNOSIS — I444 Left anterior fascicular block: Secondary | ICD-10-CM | POA: Diagnosis not present

## 2019-03-19 DIAGNOSIS — I130 Hypertensive heart and chronic kidney disease with heart failure and stage 1 through stage 4 chronic kidney disease, or unspecified chronic kidney disease: Secondary | ICD-10-CM | POA: Diagnosis not present

## 2019-03-19 DIAGNOSIS — Z96642 Presence of left artificial hip joint: Secondary | ICD-10-CM | POA: Diagnosis not present

## 2019-03-19 DIAGNOSIS — M47816 Spondylosis without myelopathy or radiculopathy, lumbar region: Secondary | ICD-10-CM | POA: Diagnosis not present

## 2019-03-25 DIAGNOSIS — Z7901 Long term (current) use of anticoagulants: Secondary | ICD-10-CM | POA: Diagnosis not present

## 2019-03-25 DIAGNOSIS — I4891 Unspecified atrial fibrillation: Secondary | ICD-10-CM | POA: Diagnosis not present

## 2019-03-25 DIAGNOSIS — G8929 Other chronic pain: Secondary | ICD-10-CM | POA: Diagnosis not present

## 2019-03-25 DIAGNOSIS — S72002D Fracture of unspecified part of neck of left femur, subsequent encounter for closed fracture with routine healing: Secondary | ICD-10-CM | POA: Diagnosis not present

## 2019-03-25 DIAGNOSIS — I080 Rheumatic disorders of both mitral and aortic valves: Secondary | ICD-10-CM | POA: Diagnosis not present

## 2019-03-25 DIAGNOSIS — N183 Chronic kidney disease, stage 3 unspecified: Secondary | ICD-10-CM | POA: Diagnosis not present

## 2019-03-25 DIAGNOSIS — Z79891 Long term (current) use of opiate analgesic: Secondary | ICD-10-CM | POA: Diagnosis not present

## 2019-03-25 DIAGNOSIS — Z9181 History of falling: Secondary | ICD-10-CM | POA: Diagnosis not present

## 2019-03-25 DIAGNOSIS — D519 Vitamin B12 deficiency anemia, unspecified: Secondary | ICD-10-CM | POA: Diagnosis not present

## 2019-03-25 DIAGNOSIS — E039 Hypothyroidism, unspecified: Secondary | ICD-10-CM | POA: Diagnosis not present

## 2019-03-25 DIAGNOSIS — S72002A Fracture of unspecified part of neck of left femur, initial encounter for closed fracture: Secondary | ICD-10-CM | POA: Diagnosis not present

## 2019-03-25 DIAGNOSIS — I2510 Atherosclerotic heart disease of native coronary artery without angina pectoris: Secondary | ICD-10-CM | POA: Diagnosis not present

## 2019-03-25 DIAGNOSIS — E785 Hyperlipidemia, unspecified: Secondary | ICD-10-CM | POA: Diagnosis not present

## 2019-03-25 DIAGNOSIS — I130 Hypertensive heart and chronic kidney disease with heart failure and stage 1 through stage 4 chronic kidney disease, or unspecified chronic kidney disease: Secondary | ICD-10-CM | POA: Diagnosis not present

## 2019-03-25 DIAGNOSIS — M810 Age-related osteoporosis without current pathological fracture: Secondary | ICD-10-CM | POA: Diagnosis not present

## 2019-03-25 DIAGNOSIS — I444 Left anterior fascicular block: Secondary | ICD-10-CM | POA: Diagnosis not present

## 2019-03-25 DIAGNOSIS — I110 Hypertensive heart disease with heart failure: Secondary | ICD-10-CM | POA: Diagnosis not present

## 2019-03-25 DIAGNOSIS — M47816 Spondylosis without myelopathy or radiculopathy, lumbar region: Secondary | ICD-10-CM | POA: Diagnosis not present

## 2019-03-25 DIAGNOSIS — Z96642 Presence of left artificial hip joint: Secondary | ICD-10-CM | POA: Diagnosis not present

## 2019-03-25 DIAGNOSIS — E43 Unspecified severe protein-calorie malnutrition: Secondary | ICD-10-CM | POA: Diagnosis not present

## 2019-03-25 DIAGNOSIS — I509 Heart failure, unspecified: Secondary | ICD-10-CM | POA: Diagnosis not present

## 2019-03-30 DIAGNOSIS — W19XXXA Unspecified fall, initial encounter: Secondary | ICD-10-CM

## 2019-04-01 ENCOUNTER — Ambulatory Visit: Payer: PPO | Attending: Internal Medicine

## 2019-04-01 DIAGNOSIS — Z23 Encounter for immunization: Secondary | ICD-10-CM

## 2019-04-01 DIAGNOSIS — M7062 Trochanteric bursitis, left hip: Secondary | ICD-10-CM | POA: Diagnosis not present

## 2019-04-01 NOTE — Progress Notes (Signed)
   Covid-19 Vaccination Clinic  Name:  Elizabeth Fields    MRN: [id number] DOB: 1931/07/18  04/01/2019  [name] was observed post Covid-19 immunization for 15 minutes without incident. She was provided with Vaccine Information Sheet and instruction to access the V-Safe system.   [name] was instructed to call 911 with any severe reactions post vaccine: Marland Kitchen Difficulty breathing  . Swelling of face and throat  . A fast heartbeat  . A bad rash all over body  . Dizziness and weakness   Immunizations Administered    Name Date Dose VIS Date Route   Pfizer COVID-19 Vaccine 04/01/2019 11:29 AM 0.3 mL 12/19/2018 Intramuscular   Manufacturer: [employer]   Lot: [id number]   [place]: [address]-4

## 2019-04-02 DIAGNOSIS — I444 Left anterior fascicular block: Secondary | ICD-10-CM | POA: Diagnosis not present

## 2019-04-02 DIAGNOSIS — S72002D Fracture of unspecified part of neck of left femur, subsequent encounter for closed fracture with routine healing: Secondary | ICD-10-CM | POA: Diagnosis not present

## 2019-04-02 DIAGNOSIS — D519 Vitamin B12 deficiency anemia, unspecified: Secondary | ICD-10-CM | POA: Diagnosis not present

## 2019-04-02 DIAGNOSIS — I080 Rheumatic disorders of both mitral and aortic valves: Secondary | ICD-10-CM | POA: Diagnosis not present

## 2019-04-02 DIAGNOSIS — M810 Age-related osteoporosis without current pathological fracture: Secondary | ICD-10-CM | POA: Diagnosis not present

## 2019-04-02 DIAGNOSIS — Z96642 Presence of left artificial hip joint: Secondary | ICD-10-CM | POA: Diagnosis not present

## 2019-04-02 DIAGNOSIS — I110 Hypertensive heart disease with heart failure: Secondary | ICD-10-CM | POA: Diagnosis not present

## 2019-04-02 DIAGNOSIS — Z7901 Long term (current) use of anticoagulants: Secondary | ICD-10-CM | POA: Diagnosis not present

## 2019-04-02 DIAGNOSIS — E43 Unspecified severe protein-calorie malnutrition: Secondary | ICD-10-CM | POA: Diagnosis not present

## 2019-04-02 DIAGNOSIS — G8929 Other chronic pain: Secondary | ICD-10-CM | POA: Diagnosis not present

## 2019-04-02 DIAGNOSIS — Z9181 History of falling: Secondary | ICD-10-CM | POA: Diagnosis not present

## 2019-04-02 DIAGNOSIS — Z79891 Long term (current) use of opiate analgesic: Secondary | ICD-10-CM | POA: Diagnosis not present

## 2019-04-02 DIAGNOSIS — E039 Hypothyroidism, unspecified: Secondary | ICD-10-CM | POA: Diagnosis not present

## 2019-04-02 DIAGNOSIS — I4891 Unspecified atrial fibrillation: Secondary | ICD-10-CM | POA: Diagnosis not present

## 2019-04-02 DIAGNOSIS — E785 Hyperlipidemia, unspecified: Secondary | ICD-10-CM | POA: Diagnosis not present

## 2019-04-02 DIAGNOSIS — N183 Chronic kidney disease, stage 3 unspecified: Secondary | ICD-10-CM | POA: Diagnosis not present

## 2019-04-02 DIAGNOSIS — I2510 Atherosclerotic heart disease of native coronary artery without angina pectoris: Secondary | ICD-10-CM | POA: Diagnosis not present

## 2019-04-02 DIAGNOSIS — I509 Heart failure, unspecified: Secondary | ICD-10-CM | POA: Diagnosis not present

## 2019-04-02 DIAGNOSIS — I130 Hypertensive heart and chronic kidney disease with heart failure and stage 1 through stage 4 chronic kidney disease, or unspecified chronic kidney disease: Secondary | ICD-10-CM | POA: Diagnosis not present

## 2019-04-02 DIAGNOSIS — M47816 Spondylosis without myelopathy or radiculopathy, lumbar region: Secondary | ICD-10-CM | POA: Diagnosis not present

## 2019-04-03 DIAGNOSIS — S72002D Fracture of unspecified part of neck of left femur, subsequent encounter for closed fracture with routine healing: Secondary | ICD-10-CM | POA: Diagnosis not present

## 2019-04-03 DIAGNOSIS — I2510 Atherosclerotic heart disease of native coronary artery without angina pectoris: Secondary | ICD-10-CM | POA: Diagnosis not present

## 2019-04-03 DIAGNOSIS — I110 Hypertensive heart disease with heart failure: Secondary | ICD-10-CM | POA: Diagnosis not present

## 2019-04-03 DIAGNOSIS — Z96642 Presence of left artificial hip joint: Secondary | ICD-10-CM | POA: Diagnosis not present

## 2019-04-03 DIAGNOSIS — M47816 Spondylosis without myelopathy or radiculopathy, lumbar region: Secondary | ICD-10-CM | POA: Diagnosis not present

## 2019-04-03 DIAGNOSIS — M810 Age-related osteoporosis without current pathological fracture: Secondary | ICD-10-CM | POA: Diagnosis not present

## 2019-04-03 DIAGNOSIS — I444 Left anterior fascicular block: Secondary | ICD-10-CM | POA: Diagnosis not present

## 2019-04-03 DIAGNOSIS — E785 Hyperlipidemia, unspecified: Secondary | ICD-10-CM | POA: Diagnosis not present

## 2019-04-03 DIAGNOSIS — I509 Heart failure, unspecified: Secondary | ICD-10-CM | POA: Diagnosis not present

## 2019-04-03 DIAGNOSIS — G8929 Other chronic pain: Secondary | ICD-10-CM | POA: Diagnosis not present

## 2019-04-03 DIAGNOSIS — I080 Rheumatic disorders of both mitral and aortic valves: Secondary | ICD-10-CM | POA: Diagnosis not present

## 2019-04-03 DIAGNOSIS — Z9181 History of falling: Secondary | ICD-10-CM | POA: Diagnosis not present

## 2019-04-03 DIAGNOSIS — Z79891 Long term (current) use of opiate analgesic: Secondary | ICD-10-CM | POA: Diagnosis not present

## 2019-04-03 DIAGNOSIS — Z7901 Long term (current) use of anticoagulants: Secondary | ICD-10-CM | POA: Diagnosis not present

## 2019-04-03 DIAGNOSIS — E039 Hypothyroidism, unspecified: Secondary | ICD-10-CM | POA: Diagnosis not present

## 2019-04-03 DIAGNOSIS — I130 Hypertensive heart and chronic kidney disease with heart failure and stage 1 through stage 4 chronic kidney disease, or unspecified chronic kidney disease: Secondary | ICD-10-CM | POA: Diagnosis not present

## 2019-04-03 DIAGNOSIS — N183 Chronic kidney disease, stage 3 unspecified: Secondary | ICD-10-CM | POA: Diagnosis not present

## 2019-04-03 DIAGNOSIS — E43 Unspecified severe protein-calorie malnutrition: Secondary | ICD-10-CM | POA: Diagnosis not present

## 2019-04-03 DIAGNOSIS — D519 Vitamin B12 deficiency anemia, unspecified: Secondary | ICD-10-CM | POA: Diagnosis not present

## 2019-04-03 DIAGNOSIS — I4891 Unspecified atrial fibrillation: Secondary | ICD-10-CM | POA: Diagnosis not present

## 2019-04-08 DIAGNOSIS — I2510 Atherosclerotic heart disease of native coronary artery without angina pectoris: Secondary | ICD-10-CM | POA: Diagnosis not present

## 2019-04-08 DIAGNOSIS — E43 Unspecified severe protein-calorie malnutrition: Secondary | ICD-10-CM | POA: Diagnosis not present

## 2019-04-08 DIAGNOSIS — I110 Hypertensive heart disease with heart failure: Secondary | ICD-10-CM | POA: Diagnosis not present

## 2019-04-08 DIAGNOSIS — I4891 Unspecified atrial fibrillation: Secondary | ICD-10-CM | POA: Diagnosis not present

## 2019-04-08 DIAGNOSIS — Z79891 Long term (current) use of opiate analgesic: Secondary | ICD-10-CM | POA: Diagnosis not present

## 2019-04-08 DIAGNOSIS — Z7901 Long term (current) use of anticoagulants: Secondary | ICD-10-CM | POA: Diagnosis not present

## 2019-04-08 DIAGNOSIS — S72002D Fracture of unspecified part of neck of left femur, subsequent encounter for closed fracture with routine healing: Secondary | ICD-10-CM | POA: Diagnosis not present

## 2019-04-08 DIAGNOSIS — Z96642 Presence of left artificial hip joint: Secondary | ICD-10-CM | POA: Diagnosis not present

## 2019-04-08 DIAGNOSIS — E785 Hyperlipidemia, unspecified: Secondary | ICD-10-CM | POA: Diagnosis not present

## 2019-04-08 DIAGNOSIS — G8929 Other chronic pain: Secondary | ICD-10-CM | POA: Diagnosis not present

## 2019-04-08 DIAGNOSIS — N183 Chronic kidney disease, stage 3 unspecified: Secondary | ICD-10-CM | POA: Diagnosis not present

## 2019-04-08 DIAGNOSIS — I509 Heart failure, unspecified: Secondary | ICD-10-CM | POA: Diagnosis not present

## 2019-04-08 DIAGNOSIS — M47816 Spondylosis without myelopathy or radiculopathy, lumbar region: Secondary | ICD-10-CM | POA: Diagnosis not present

## 2019-04-08 DIAGNOSIS — I080 Rheumatic disorders of both mitral and aortic valves: Secondary | ICD-10-CM | POA: Diagnosis not present

## 2019-04-08 DIAGNOSIS — M810 Age-related osteoporosis without current pathological fracture: Secondary | ICD-10-CM | POA: Diagnosis not present

## 2019-04-08 DIAGNOSIS — I130 Hypertensive heart and chronic kidney disease with heart failure and stage 1 through stage 4 chronic kidney disease, or unspecified chronic kidney disease: Secondary | ICD-10-CM | POA: Diagnosis not present

## 2019-04-08 DIAGNOSIS — E039 Hypothyroidism, unspecified: Secondary | ICD-10-CM | POA: Diagnosis not present

## 2019-04-08 DIAGNOSIS — D519 Vitamin B12 deficiency anemia, unspecified: Secondary | ICD-10-CM | POA: Diagnosis not present

## 2019-04-08 DIAGNOSIS — Z9181 History of falling: Secondary | ICD-10-CM | POA: Diagnosis not present

## 2019-04-08 DIAGNOSIS — I444 Left anterior fascicular block: Secondary | ICD-10-CM | POA: Diagnosis not present

## 2019-04-13 DIAGNOSIS — M810 Age-related osteoporosis without current pathological fracture: Secondary | ICD-10-CM | POA: Diagnosis not present

## 2019-04-15 DIAGNOSIS — S72002D Fracture of unspecified part of neck of left femur, subsequent encounter for closed fracture with routine healing: Secondary | ICD-10-CM | POA: Diagnosis not present

## 2019-04-15 DIAGNOSIS — N183 Chronic kidney disease, stage 3 unspecified: Secondary | ICD-10-CM | POA: Diagnosis not present

## 2019-04-15 DIAGNOSIS — M47816 Spondylosis without myelopathy or radiculopathy, lumbar region: Secondary | ICD-10-CM | POA: Diagnosis not present

## 2019-04-15 DIAGNOSIS — E039 Hypothyroidism, unspecified: Secondary | ICD-10-CM | POA: Diagnosis not present

## 2019-04-15 DIAGNOSIS — I2510 Atherosclerotic heart disease of native coronary artery without angina pectoris: Secondary | ICD-10-CM | POA: Diagnosis not present

## 2019-04-15 DIAGNOSIS — I444 Left anterior fascicular block: Secondary | ICD-10-CM | POA: Diagnosis not present

## 2019-04-15 DIAGNOSIS — Z79891 Long term (current) use of opiate analgesic: Secondary | ICD-10-CM | POA: Diagnosis not present

## 2019-04-15 DIAGNOSIS — I110 Hypertensive heart disease with heart failure: Secondary | ICD-10-CM | POA: Diagnosis not present

## 2019-04-15 DIAGNOSIS — I080 Rheumatic disorders of both mitral and aortic valves: Secondary | ICD-10-CM | POA: Diagnosis not present

## 2019-04-15 DIAGNOSIS — E785 Hyperlipidemia, unspecified: Secondary | ICD-10-CM | POA: Diagnosis not present

## 2019-04-15 DIAGNOSIS — I130 Hypertensive heart and chronic kidney disease with heart failure and stage 1 through stage 4 chronic kidney disease, or unspecified chronic kidney disease: Secondary | ICD-10-CM | POA: Diagnosis not present

## 2019-04-15 DIAGNOSIS — I509 Heart failure, unspecified: Secondary | ICD-10-CM | POA: Diagnosis not present

## 2019-04-15 DIAGNOSIS — Z9181 History of falling: Secondary | ICD-10-CM | POA: Diagnosis not present

## 2019-04-15 DIAGNOSIS — E43 Unspecified severe protein-calorie malnutrition: Secondary | ICD-10-CM | POA: Diagnosis not present

## 2019-04-15 DIAGNOSIS — Z96642 Presence of left artificial hip joint: Secondary | ICD-10-CM | POA: Diagnosis not present

## 2019-04-15 DIAGNOSIS — Z7901 Long term (current) use of anticoagulants: Secondary | ICD-10-CM | POA: Diagnosis not present

## 2019-04-15 DIAGNOSIS — D519 Vitamin B12 deficiency anemia, unspecified: Secondary | ICD-10-CM | POA: Diagnosis not present

## 2019-04-15 DIAGNOSIS — G8929 Other chronic pain: Secondary | ICD-10-CM | POA: Diagnosis not present

## 2019-04-15 DIAGNOSIS — M810 Age-related osteoporosis without current pathological fracture: Secondary | ICD-10-CM | POA: Diagnosis not present

## 2019-04-15 DIAGNOSIS — I4891 Unspecified atrial fibrillation: Secondary | ICD-10-CM | POA: Diagnosis not present

## 2019-04-20 DIAGNOSIS — E538 Deficiency of other specified B group vitamins: Secondary | ICD-10-CM | POA: Diagnosis not present

## 2019-04-25 DIAGNOSIS — S72002A Fracture of unspecified part of neck of left femur, initial encounter for closed fracture: Secondary | ICD-10-CM | POA: Diagnosis not present

## 2019-04-27 DIAGNOSIS — M810 Age-related osteoporosis without current pathological fracture: Secondary | ICD-10-CM | POA: Diagnosis not present

## 2019-04-27 DIAGNOSIS — I480 Paroxysmal atrial fibrillation: Secondary | ICD-10-CM | POA: Diagnosis not present

## 2019-04-27 DIAGNOSIS — N1832 Chronic kidney disease, stage 3b: Secondary | ICD-10-CM | POA: Diagnosis not present

## 2019-05-04 DIAGNOSIS — M810 Age-related osteoporosis without current pathological fracture: Secondary | ICD-10-CM | POA: Diagnosis not present

## 2019-05-04 DIAGNOSIS — E559 Vitamin D deficiency, unspecified: Secondary | ICD-10-CM | POA: Diagnosis not present

## 2019-05-21 DIAGNOSIS — I480 Paroxysmal atrial fibrillation: Secondary | ICD-10-CM | POA: Diagnosis not present

## 2019-05-21 DIAGNOSIS — E538 Deficiency of other specified B group vitamins: Secondary | ICD-10-CM | POA: Diagnosis not present

## 2019-05-25 ENCOUNTER — Ambulatory Visit
Admission: RE | Admit: 2019-05-25 | Discharge: 2019-05-25 | Disposition: A | Discharge status: Home / Self Care | Payer: PPO | Source: Ambulatory Visit | Attending: Internal Medicine | Admitting: Internal Medicine

## 2019-05-25 DIAGNOSIS — Z1231 Encounter for screening mammogram for malignant neoplasm of breast: Secondary | ICD-10-CM | POA: Diagnosis not present

## 2019-06-22 DIAGNOSIS — E538 Deficiency of other specified B group vitamins: Secondary | ICD-10-CM | POA: Diagnosis not present

## 2019-06-22 DIAGNOSIS — I480 Paroxysmal atrial fibrillation: Secondary | ICD-10-CM | POA: Diagnosis not present

## 2019-07-06 DIAGNOSIS — I341 Nonrheumatic mitral (valve) prolapse: Secondary | ICD-10-CM | POA: Diagnosis not present

## 2019-07-06 DIAGNOSIS — I340 Nonrheumatic mitral (valve) insufficiency: Secondary | ICD-10-CM | POA: Diagnosis not present

## 2019-07-06 DIAGNOSIS — I493 Ventricular premature depolarization: Secondary | ICD-10-CM | POA: Diagnosis not present

## 2019-07-06 DIAGNOSIS — I480 Paroxysmal atrial fibrillation: Secondary | ICD-10-CM | POA: Diagnosis not present

## 2019-07-06 DIAGNOSIS — E785 Hyperlipidemia, unspecified: Secondary | ICD-10-CM | POA: Diagnosis not present

## 2019-07-06 DIAGNOSIS — I361 Nonrheumatic tricuspid (valve) insufficiency: Secondary | ICD-10-CM | POA: Diagnosis not present

## 2019-07-06 DIAGNOSIS — R0602 Shortness of breath: Secondary | ICD-10-CM | POA: Diagnosis not present

## 2019-07-06 DIAGNOSIS — R079 Chest pain, unspecified: Secondary | ICD-10-CM | POA: Diagnosis not present

## 2019-07-06 DIAGNOSIS — I351 Nonrheumatic aortic (valve) insufficiency: Secondary | ICD-10-CM | POA: Diagnosis not present

## 2019-07-06 DIAGNOSIS — I2510 Atherosclerotic heart disease of native coronary artery without angina pectoris: Secondary | ICD-10-CM | POA: Diagnosis not present

## 2019-07-06 DIAGNOSIS — Z9889 Other specified postprocedural states: Secondary | ICD-10-CM | POA: Diagnosis not present

## 2019-07-06 DIAGNOSIS — D519 Vitamin B12 deficiency anemia, unspecified: Secondary | ICD-10-CM | POA: Diagnosis not present

## 2019-07-20 DIAGNOSIS — N1832 Chronic kidney disease, stage 3b: Secondary | ICD-10-CM | POA: Diagnosis not present

## 2019-07-20 DIAGNOSIS — M810 Age-related osteoporosis without current pathological fracture: Secondary | ICD-10-CM | POA: Diagnosis not present

## 2019-07-20 DIAGNOSIS — R8290 Unspecified abnormal findings in urine: Secondary | ICD-10-CM | POA: Diagnosis not present

## 2019-07-20 DIAGNOSIS — I480 Paroxysmal atrial fibrillation: Secondary | ICD-10-CM | POA: Diagnosis not present

## 2019-07-20 DIAGNOSIS — E034 Atrophy of thyroid (acquired): Secondary | ICD-10-CM | POA: Diagnosis not present

## 2019-07-20 DIAGNOSIS — I2510 Atherosclerotic heart disease of native coronary artery without angina pectoris: Secondary | ICD-10-CM | POA: Diagnosis not present

## 2019-07-27 DIAGNOSIS — I480 Paroxysmal atrial fibrillation: Secondary | ICD-10-CM | POA: Diagnosis not present

## 2019-07-27 DIAGNOSIS — I2510 Atherosclerotic heart disease of native coronary artery without angina pectoris: Secondary | ICD-10-CM | POA: Diagnosis not present

## 2019-07-27 DIAGNOSIS — N1832 Chronic kidney disease, stage 3b: Secondary | ICD-10-CM | POA: Diagnosis not present

## 2019-07-27 DIAGNOSIS — Z0001 Encounter for general adult medical examination with abnormal findings: Secondary | ICD-10-CM | POA: Diagnosis not present

## 2019-07-27 DIAGNOSIS — E034 Atrophy of thyroid (acquired): Secondary | ICD-10-CM | POA: Diagnosis not present

## 2019-07-27 DIAGNOSIS — E785 Hyperlipidemia, unspecified: Secondary | ICD-10-CM | POA: Diagnosis not present

## 2019-07-27 DIAGNOSIS — E538 Deficiency of other specified B group vitamins: Secondary | ICD-10-CM | POA: Diagnosis not present

## 2019-07-27 DIAGNOSIS — Z0000 Encounter for general adult medical examination without abnormal findings: Secondary | ICD-10-CM | POA: Diagnosis not present

## 2019-08-10 DIAGNOSIS — I361 Nonrheumatic tricuspid (valve) insufficiency: Secondary | ICD-10-CM | POA: Diagnosis not present

## 2019-08-10 DIAGNOSIS — R079 Chest pain, unspecified: Secondary | ICD-10-CM | POA: Diagnosis not present

## 2019-08-10 DIAGNOSIS — I341 Nonrheumatic mitral (valve) prolapse: Secondary | ICD-10-CM | POA: Diagnosis not present

## 2019-08-10 DIAGNOSIS — R0602 Shortness of breath: Secondary | ICD-10-CM | POA: Diagnosis not present

## 2019-08-10 DIAGNOSIS — I340 Nonrheumatic mitral (valve) insufficiency: Secondary | ICD-10-CM | POA: Diagnosis not present

## 2019-08-10 DIAGNOSIS — I351 Nonrheumatic aortic (valve) insufficiency: Secondary | ICD-10-CM | POA: Diagnosis not present

## 2019-08-27 DIAGNOSIS — I480 Paroxysmal atrial fibrillation: Secondary | ICD-10-CM | POA: Diagnosis not present

## 2019-08-27 DIAGNOSIS — E538 Deficiency of other specified B group vitamins: Secondary | ICD-10-CM | POA: Diagnosis not present

## 2019-09-07 DIAGNOSIS — H903 Sensorineural hearing loss, bilateral: Secondary | ICD-10-CM | POA: Diagnosis not present

## 2019-09-07 DIAGNOSIS — H6123 Impacted cerumen, bilateral: Secondary | ICD-10-CM | POA: Diagnosis not present

## 2019-09-07 DIAGNOSIS — H60339 Swimmer's ear, unspecified ear: Secondary | ICD-10-CM | POA: Diagnosis not present

## 2019-09-17 DIAGNOSIS — Z23 Encounter for immunization: Secondary | ICD-10-CM | POA: Diagnosis not present

## 2019-09-28 DIAGNOSIS — I480 Paroxysmal atrial fibrillation: Secondary | ICD-10-CM | POA: Diagnosis not present

## 2019-09-28 DIAGNOSIS — E538 Deficiency of other specified B group vitamins: Secondary | ICD-10-CM | POA: Diagnosis not present

## 2019-10-29 DIAGNOSIS — E538 Deficiency of other specified B group vitamins: Secondary | ICD-10-CM | POA: Diagnosis not present

## 2019-10-29 DIAGNOSIS — I480 Paroxysmal atrial fibrillation: Secondary | ICD-10-CM | POA: Diagnosis not present

## 2019-11-02 ENCOUNTER — Other Ambulatory Visit: Payer: Self-pay

## 2019-11-02 ENCOUNTER — Ambulatory Visit: Payer: PPO | Admitting: Dermatology

## 2019-11-02 DIAGNOSIS — L578 Other skin changes due to chronic exposure to nonionizing radiation: Secondary | ICD-10-CM | POA: Diagnosis not present

## 2019-11-02 DIAGNOSIS — D492 Neoplasm of unspecified behavior of bone, soft tissue, and skin: Secondary | ICD-10-CM

## 2019-11-02 DIAGNOSIS — D229 Melanocytic nevi, unspecified: Secondary | ICD-10-CM

## 2019-11-02 DIAGNOSIS — L720 Epidermal cyst: Secondary | ICD-10-CM | POA: Diagnosis not present

## 2019-11-02 DIAGNOSIS — L570 Actinic keratosis: Secondary | ICD-10-CM | POA: Diagnosis not present

## 2019-11-02 DIAGNOSIS — L821 Other seborrheic keratosis: Secondary | ICD-10-CM

## 2019-11-02 DIAGNOSIS — D1800 Hemangioma unspecified site: Secondary | ICD-10-CM

## 2019-11-02 DIAGNOSIS — L814 Other melanin hyperpigmentation: Secondary | ICD-10-CM | POA: Diagnosis not present

## 2019-11-02 DIAGNOSIS — I872 Venous insufficiency (chronic) (peripheral): Secondary | ICD-10-CM | POA: Diagnosis not present

## 2019-11-02 DIAGNOSIS — D485 Neoplasm of uncertain behavior of skin: Secondary | ICD-10-CM

## 2019-11-02 DIAGNOSIS — Z85828 Personal history of other malignant neoplasm of skin: Secondary | ICD-10-CM | POA: Diagnosis not present

## 2019-11-02 DIAGNOSIS — L820 Inflamed seborrheic keratosis: Secondary | ICD-10-CM | POA: Diagnosis not present

## 2019-11-02 DIAGNOSIS — Z1283 Encounter for screening for malignant neoplasm of skin: Secondary | ICD-10-CM | POA: Diagnosis not present

## 2019-11-02 HISTORY — DX: Actinic keratosis: L57.0

## 2019-11-02 NOTE — Patient Instructions (Addendum)
Cryotherapy Aftercare ° °• Wash gently with soap and water everyday.   °• Apply Vaseline and Band-Aid daily until healed. °Wound Care Instructions ° °On the day following your surgery, you should begin doing daily dressing changes: °Remove the old dressing and discard it. °Cleanse the wound gently with tap water. This may be done in the shower or by placing a wet gauze pad directly on the wound and letting it soak for several minutes. °It is important to gently remove any dried blood from the wound in order to encourage healing. This may be done by gently rolling a moistened Q-tip on the dried blood. Do not pick at the wound. °If the wound should start to bleed, continue cleaning the wound, then place a moist gauze pad on the wound and hold pressure for a few minutes.  °Make sure you then dry the skin surrounding the wound completely or the tape will not stick to the skin. Do not use cotton balls on the wound. °After the wound is clean and dry, apply the ointment gently with a Q-tip. °Cut a non-stick pad to fit the size of the wound. Lay the pad flush to the wound. If the wound is draining, you may want to reinforce it with a small amount of gauze on top of the non-stick pad for a little added compression to the area. °Use the tape to seal the area completely. °Select from the following with respect to your individual situation: °If your wound has been stitched closed: continue the above steps 1-8 at least daily until your sutures are removed. °If your wound has been left open to heal: continue steps 1-8 at least daily for the first 3-4 weeks. °We would like for you to take a few extra precautions for at least the next week. °Sleep with your head elevated on pillows if our wound is on your head. °Do not bend over or lift heavy items to reduce the chance of elevated blood pressure to the wound °Do not participate in particularly strenuous activities. ° ° °Below is a list of dressing supplies you might need.   °Cotton-tipped applicators - Q-tips °Gauze pads (2x2 and/or 4x4) - All-Purpose Sponges °Non-stick dressing material - Telfa °Tape - Paper or Hypafix °New and clean tube of petroleum jelly - Vaseline  ° ° °Comments on Post-Operative Period °Slight swelling and redness often appear around the wound. This is normal and will disappear within several days following the surgery. °The healing wound will drain a brownish-red-yellow discharge during healing. This is a normal phase of wound healing. As the wound begins to heal, the drainage may increase in amount. Again, this drainage is normal. °Notify us if the drainage becomes persistently bloody, excessively swollen, or intensely painful or develops a foul odor or red streaks.  °If you should experience mild discomfort during the healing phase, you may take an aspirin-free medication such as Tylenol (acetaminophen). Notify us if the discomfort is severe or persistent. Avoid alcoholic beverages when taking pain medicine. ° °In Case of Wound Hemorrhage °A wound hemorrhage is when the bandage suddenly becomes soaked with bright red blood and flows profusely. If this happens, sit down or lie down with your head elevated. If the wound has a dressing on it, do not remove the dressing. Apply pressure to the existing gauze. If the wound is not covered, use a gauze pad to apply pressure and continue applying the pressure for 20 minutes without peeking. DO NOT COVER THE WOUND WITH A LARGE TOWEL   OR WASH CLOTH. Release your hand from the wound site but do not remove the dressing. If the bleeding has stopped, gently clean around the wound. Leave the dressing in place for 24 hours if possible. This wait time allows the blood vessels to close off so that you do not spark a new round of bleeding by disrupting the newly clotted blood vessels with an immediate dressing change. If the bleeding does not subside, continue to hold pressure. If matters are out of your control, contact an After  Hours clinic or go to the Emergency Room. ° °•  °•  °

## 2019-11-02 NOTE — Progress Notes (Signed)
Follow-Up Visit   Subjective  Elizabeth Fields is a 86 y.o. female who presents for the following: Annual Exam (Pt presents for TBSE, Hx of SCC R cheek ). Pt concerned about pink scaly places on her face, The patient presents for Total-Body Skin Exam (TBSE) for skin cancer screening and mole check.  The following portions of the chart were reviewed this encounter and updated as appropriate:  Tobacco  Allergies  Meds  Problems  Med Hx  Surg Hx  Fam Hx     Review of Systems:  No other skin or systemic complaints except as noted in HPI or Assessment and Plan.  Objective  Well appearing patient in no apparent distress; mood and affect are within normal limits.  A full examination was performed including scalp, head, eyes, ears, nose, lips, neck, chest, axillae, abdomen, back, buttocks, bilateral upper extremities, bilateral lower extremities, hands, feet, fingers, toes, fingernails, and toenails. All findings within normal limits unless otherwise noted below.  Objective  Left Hand: Erythematous keratotic or waxy stuck-on papule or plaque.   Objective  Head - Anterior (Face) (12): Erythematous thin papules/macules with gritty scale.   Objective  Head - Anterior (Face): Smooth white papule(s).   Objective  lower legs: Erythematous, scaly patches involving the ankle and distal lower leg with associated lower leg edema.   Objective  Left calf: 0.7 cm crusted papule   Objective  L cheek: Well healed scar with no evidence of recurrence, no lymphadenopathy.    Assessment & Plan  Inflamed seborrheic keratosis Left Hand  Destruction of lesion - Left Hand Complexity: simple   Destruction method: cryotherapy   Informed consent: discussed and consent obtained   Timeout:  patient name, date of birth, surgical site, and procedure verified Lesion destroyed using liquid nitrogen: Yes   Region frozen until ice ball extended beyond lesion: Yes   Outcome: patient tolerated  procedure well with no complications   Post-procedure details: wound care instructions given    AK (actinic keratosis) (12) Head - Anterior (Face)  Destruction of lesion - Head - Anterior (Face) Complexity: simple   Destruction method: cryotherapy   Informed consent: discussed and consent obtained   Timeout:  patient name, date of birth, surgical site, and procedure verified Lesion destroyed using liquid nitrogen: Yes   Region frozen until ice ball extended beyond lesion: Yes   Outcome: patient tolerated procedure well with no complications   Post-procedure details: wound care instructions given    Milia Head - Anterior (Face) Benign-appearing.  Observation.  Call clinic for new or changing spots.  Recommend daily use of broad spectrum spf 30+ sunscreen to sun-exposed areas.  Observe   Venous stasis dermatitis of left lower extremity lower legs Graduated compression stockings Stasis changes with shambergs purpura   The patient will observe these symptoms, and report promptly any worsening or unexpected persistence.  If well, may return prn.   Neoplasm of skin Left calf Epidermal / dermal shaving  Lesion diameter (cm):  0.7 Informed consent: discussed and consent obtained   Timeout: patient name, date of birth, surgical site, and procedure verified   Procedure prep:  Patient was prepped and draped in usual sterile fashion Prep type:  Isopropyl alcohol Anesthesia: the lesion was anesthetized in a standard fashion   Anesthetic:  1% lidocaine w/ epinephrine 1-100,000 buffered w/ 8.4% NaHCO3 Hemostasis achieved with: pressure, aluminum chloride and electrodesiccation   Outcome: patient tolerated procedure well   Post-procedure details: sterile dressing applied and wound care  instructions given   Dressing type: bandage and petrolatum    Specimen 1 - Surgical pathology Differential Diagnosis: R/O ISK vs other  Check Margins: No 0.7 cm crusted papule  History of SCC (squamous  cell carcinoma) of skin L cheek Clear. Observe for recurrence. Call clinic for new or changing lesions.  Recommend regular skin exams, daily broad-spectrum spf 30+ sunscreen use, and photoprotection.      Lentigines - Scattered tan macules - Discussed due to sun exposure - Benign, observe - Call for any changes  Seborrheic Keratoses - Stuck-on, waxy, tan-brown papules and plaques  - Discussed benign etiology and prognosis. - Observe - Call for any changes  Melanocytic Nevi - Tan-brown and/or pink-flesh-colored symmetric macules and papules - Benign appearing on exam today - Observation - Call clinic for new or changing moles - Recommend daily use of broad spectrum spf 30+ sunscreen to sun-exposed areas.   Hemangiomas - Red papules - Discussed benign nature - Observe - Call for any changes  Actinic Damage - diffuse scaly erythematous macules with underlying dyspigmentation - Recommend daily broad spectrum sunscreen SPF 30+ to sun-exposed areas, reapply every 2 hours as needed.  - Call for new or changing lesions.  Skin cancer screening performed today.  Return in about 1 year (around 11/01/2020).  IMarye Round, CMA, am acting as scribe for Sarina Ser, MD .  Documentation: I have reviewed the above documentation for accuracy and completeness, and I agree with the above.  Sarina Ser, MD

## 2019-11-03 ENCOUNTER — Encounter: Payer: Self-pay | Admitting: Dermatology

## 2019-11-05 DIAGNOSIS — I2510 Atherosclerotic heart disease of native coronary artery without angina pectoris: Secondary | ICD-10-CM | POA: Diagnosis not present

## 2019-11-05 DIAGNOSIS — I351 Nonrheumatic aortic (valve) insufficiency: Secondary | ICD-10-CM | POA: Diagnosis not present

## 2019-11-05 DIAGNOSIS — I361 Nonrheumatic tricuspid (valve) insufficiency: Secondary | ICD-10-CM | POA: Diagnosis not present

## 2019-11-05 DIAGNOSIS — I480 Paroxysmal atrial fibrillation: Secondary | ICD-10-CM | POA: Diagnosis not present

## 2019-11-05 DIAGNOSIS — E785 Hyperlipidemia, unspecified: Secondary | ICD-10-CM | POA: Diagnosis not present

## 2019-11-05 DIAGNOSIS — I340 Nonrheumatic mitral (valve) insufficiency: Secondary | ICD-10-CM | POA: Diagnosis not present

## 2019-11-05 DIAGNOSIS — I493 Ventricular premature depolarization: Secondary | ICD-10-CM | POA: Diagnosis not present

## 2019-11-12 ENCOUNTER — Telehealth: Payer: Self-pay

## 2019-11-12 NOTE — Telephone Encounter (Signed)
-----   Message from Ralene Bathe, MD sent at 11/09/2019  8:56 AM EDT ----- Diagnosis Skin , left calf HYPERTROPHIC ACTINIC KERATOSIS, BASE INVOLVED (SEE DESCRIPTION)  PreCancer  Schedule for treatment (Ln2)

## 2019-11-12 NOTE — Telephone Encounter (Signed)
Patient informed of results and appointment scheduled. 

## 2019-11-16 DIAGNOSIS — N1832 Chronic kidney disease, stage 3b: Secondary | ICD-10-CM | POA: Diagnosis not present

## 2019-11-16 DIAGNOSIS — E034 Atrophy of thyroid (acquired): Secondary | ICD-10-CM | POA: Diagnosis not present

## 2019-11-23 DIAGNOSIS — M810 Age-related osteoporosis without current pathological fracture: Secondary | ICD-10-CM | POA: Diagnosis not present

## 2019-11-23 DIAGNOSIS — I2510 Atherosclerotic heart disease of native coronary artery without angina pectoris: Secondary | ICD-10-CM | POA: Diagnosis not present

## 2019-11-23 DIAGNOSIS — E785 Hyperlipidemia, unspecified: Secondary | ICD-10-CM | POA: Diagnosis not present

## 2019-11-23 DIAGNOSIS — E034 Atrophy of thyroid (acquired): Secondary | ICD-10-CM | POA: Diagnosis not present

## 2019-11-23 DIAGNOSIS — I480 Paroxysmal atrial fibrillation: Secondary | ICD-10-CM | POA: Diagnosis not present

## 2019-11-23 DIAGNOSIS — N1832 Chronic kidney disease, stage 3b: Secondary | ICD-10-CM | POA: Diagnosis not present

## 2019-11-30 DIAGNOSIS — E538 Deficiency of other specified B group vitamins: Secondary | ICD-10-CM | POA: Diagnosis not present

## 2019-11-30 DIAGNOSIS — I480 Paroxysmal atrial fibrillation: Secondary | ICD-10-CM | POA: Diagnosis not present

## 2019-12-02 DIAGNOSIS — Z961 Presence of intraocular lens: Secondary | ICD-10-CM | POA: Diagnosis not present

## 2019-12-31 DIAGNOSIS — I480 Paroxysmal atrial fibrillation: Secondary | ICD-10-CM | POA: Diagnosis not present

## 2019-12-31 DIAGNOSIS — Z7901 Long term (current) use of anticoagulants: Secondary | ICD-10-CM | POA: Diagnosis not present

## 2019-12-31 DIAGNOSIS — Z5181 Encounter for therapeutic drug level monitoring: Secondary | ICD-10-CM | POA: Diagnosis not present

## 2019-12-31 DIAGNOSIS — E538 Deficiency of other specified B group vitamins: Secondary | ICD-10-CM | POA: Diagnosis not present

## 2020-01-26 DIAGNOSIS — Z79899 Other long term (current) drug therapy: Secondary | ICD-10-CM | POA: Insufficient documentation

## 2020-01-26 DIAGNOSIS — R69 Illness, unspecified: Secondary | ICD-10-CM | POA: Insufficient documentation

## 2020-01-26 DIAGNOSIS — M899 Disorder of bone, unspecified: Secondary | ICD-10-CM | POA: Insufficient documentation

## 2020-01-26 DIAGNOSIS — Z789 Other specified health status: Secondary | ICD-10-CM | POA: Insufficient documentation

## 2020-01-26 NOTE — Progress Notes (Deleted)
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 Pharmacologic therapy   3. Disorder of skeletal system   4. Problems influencing health status   5. Multiple chronic diseases    Plan of Care (Initial workup plan)  Note: Ms. Armwood was reminded that as per protocol, today's visit has been an evaluation only. We have not taken over the patient's controlled substance management.  Problem-specific plan: No problem-specific Assessment & Plan notes found for this encounter.  Lab Orders  No laboratory test(s) ordered today   Imaging Orders  No imaging studies ordered today   Referral Orders  No referral(s) requested today   Procedure Orders    No procedure(s) ordered today   Pharmacotherapy (current): Medications ordered:  No orders of the defined types were placed in this encounter.  Medications administered during this visit: Melaysia C. Swisher had no medications administered during this visit.   Pharmacological management options:   Opioid Analgesics: The patient was informed that there is no guarantee that she would be a candidate for opioid analgesics. The decision will be made following CDC guidelines. This decision will be based on the results of diagnostic studies, as well as Ms. Stenberg's risk profile.   Membrane stabilizer: To be determined at a later time  Muscle relaxant: To be determined at a later time  NSAID: To be determined at a later time  Other analgesic(s): To be determined at a later time   Interventional Therapies: Scheduled:Diagnostic bilateral lumbar facet block #2 under fluoroscopic guidance and IV sedation.    Considering: Possible bilateral lumbar facet radiofrequency ablation.  Possible lumbar epidural steroid injection under fluoroscopic guidance, no sedation.    PRN Procedures:None at this time.     Provider-requested follow-up: No follow-ups on file.  Future Appointments  Date Time Provider [place]  01/27/2020  9:00 AM Milinda Pointer, MD [facility] None  02/02/2020  4:30 PM Ralene Bathe, MD [facility] None  11/03/2020 11:15 AM Ralene Bathe, MD [facility] None    Note by: Gaspar Cola, MD Date: 01/27/2020; Time: 9:41 AM

## 2020-01-27 ENCOUNTER — Ambulatory Visit: Payer: PPO | Admitting: Pain Medicine

## 2020-01-27 DIAGNOSIS — M810 Age-related osteoporosis without current pathological fracture: Secondary | ICD-10-CM | POA: Diagnosis not present

## 2020-02-01 NOTE — Progress Notes (Addendum)
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 (Bilateral) 02/03/2020  . History of osteopenia 02/03/2020  . Chronic midline thoracic back pain 02/03/2020   Resolved Ambulatory Problems    Diagnosis Date Noted  . AI (aortic incompetence) 08/26/2013  . S/p left hip fracture 02/09/2019   Past Medical History:  Diagnosis Date  . Actinic keratosis   . Actinic keratosis 11/02/2019  . Allergy   . CHF (congestive heart failure) ([place])   . History of squamous cell carcinoma   . Hyperlipidemia   . Hypertension   . Renal insufficiency   . Squamous cell carcinoma of skin 02/25/2017  . Thyroid disease    Constitutional Exam  General appearance: Well nourished, well developed, and well hydrated. In no apparent acute distress Vitals:   02/03/20 1033  BP: (!) 109/95  Pulse: 90  Resp: 16  Temp: (!) 97 F (36.1 C)  SpO2: 100%  Weight: 92 lb (41.7 kg)  Height: '5\' 1"'  (1.549 m)   BMI Assessment: Estimated body mass index is 17.38  kg/m as calculated from the following:   Height as of this encounter: '5\' 1"'  (1.549 m).   Weight as of this encounter: 92 lb (41.7 kg).  BMI interpretation table: BMI level Category Range association with higher incidence of chronic pain  <18 kg/m2 Underweight   18.5-24.9 kg/m2 Ideal body weight   25-29.9 kg/m2 Overweight Increased incidence by 20%  30-34.9 kg/m2 Obese (Class I) Increased incidence by 68%  35-39.9 kg/m2 Severe obesity (Class II) Increased incidence by 136%  >40 kg/m2 Extreme obesity (Class III) Increased incidence by 254%   Patient's current BMI Ideal Body weight  Body mass index is 17.38 kg/m. Female patients must weigh at least 45.5 kg to calculate ideal body weight   BMI Readings from Last 4 Encounters:  02/03/20 17.38 kg/m  02/17/19 23.39 kg/m  09/29/15 19.65 kg/m  09/06/15 19.65 kg/m   Wt Readings from Last 4 Encounters:  02/03/20 92 lb (41.7 kg)  02/17/19 127 lb 13.9 oz (58 kg)  09/29/15 104 lb (47.2 kg)  09/06/15 104 lb (47.2 kg)    Psych/Mental status: Alert, oriented x 3 (person, place, & time)       Eyes: PERLA Respiratory: No evidence of acute respiratory distress  Assessment  Primary Diagnosis & Pertinent Problem List: The primary encounter diagnosis was Chronic pain syndrome. Diagnoses of Chronic low back pain (Bilateral) w/o sciatica, Compression fracture of T12 vertebra, sequela, Compression fracture of L1 vertebra, sequela, Compression fracture of L4 vertebra, sequela, DDD (degenerative disc disease), lumbosacral, DDD (degenerative disc disease), thoracolumbar, Chronic sacroiliac joint pain (Bilateral), Osteoarthritis of sacroiliac joints (HCC) (Bilateral), Abnormal MRI, lumbar spine (11/26/2018), History of osteopenia, Pharmacologic therapy, Disorder of skeletal system, Problems influencing health status, Uncomplicated opioid dependence ([place]), Anemia due to vitamin B12 deficiency, unspecified B12 deficiency type, Chronic anticoagulation  (Coumadin), Stage 3a chronic kidney disease ([place]), Hyponatremia, Hypocalcemia, Hypermagnesemia, Decreased GFR, Elevated serum creatinine, Elevated brain natriuretic peptide (BNP) level, Low hemoglobin, Low hematocrit, Anemia in stage 3a chronic kidney disease ([place]), Bergmann's syndrome (AKA: Fat Embolism Syndrome), and Chronic midline thoracic back pain were also pertinent to this visit.  Visit Diagnosis (New problems to examiner): 1. Chronic pain syndrome   2. Chronic low back pain (Bilateral) w/o sciatica   3. Compression fracture of T12 vertebra, sequela   4. Compression fracture of L1 vertebra, sequela   5. Compression fracture of L4 vertebra, sequela   6. DDD (degenerative disc disease), lumbosacral   7. DDD (degenerative disc disease), thoracolumbar   8. Chronic sacroiliac joint pain (Bilateral)   9. Osteoarthritis of sacroiliac joints (HCC) (Bilateral)   10. Abnormal MRI, lumbar spine (11/26/2018)   11. History of osteopenia   12. Pharmacologic therapy   13. Disorder of skeletal system   14. Problems influencing health status   15. Uncomplicated opioid dependence ([place])   16. Anemia due to vitamin B12 deficiency, unspecified B12 deficiency type   17. Chronic anticoagulation (Coumadin)   18. Stage 3a chronic kidney disease ([place])   19. Hyponatremia   20. Hypocalcemia   21. Hypermagnesemia   22. Decreased GFR   23. Elevated serum creatinine   24. Elevated brain natriuretic peptide (BNP) level   25. Low hemoglobin   26. Low hematocrit   27. Anemia in stage 3a chronic kidney disease ([place])   28. Bergmann's syndrome (AKA: Fat Embolism Syndrome)   29. Chronic midline thoracic back pain    Plan of Care (Initial workup plan)  Note: Ms. Richner was reminded that as per protocol, today's visit has been an evaluation only. We have not taken over the patient's controlled substance management.  Problem-specific plan: No problem-specific Assessment & Plan notes found for this  encounter.   Lab Orders     Compliance Drug Analysis, Ur     Comp. Metabolic Panel (12)     Magnesium     Vitamin B12     Sedimentation rate     25-Hydroxy vitamin D Lcms D2+D3     C-reactive protein     Protime-INR     APTT     Platelet count     Platelet function assay     Brain natriuretic peptide     CBC  Imaging Orders     DG Bone Density     DG Lumbar Spine Complete W/Bend     DG Thoracic Spine 2 View Referral Orders  No referral(s) requested today   Procedure Orders    No procedure(s) ordered today   Pharmacotherapy (current): Medications ordered:  No orders of the defined types were placed in this encounter.  Medications administered during this visit: Camielle C. Bocanegra had no medications administered during this visit.   Pharmacological management options:  Opioid Analgesics: The patient was informed that there is no  guarantee that she would be a candidate for opioid analgesics. The decision will be made following CDC guidelines. This decision will be based on the results of diagnostic studies, as well as Ms. Tregoning's risk profile.   Membrane stabilizer: To be determined at a later time  Muscle relaxant: To be determined at a later time  NSAID: To be determined at a later time  Other analgesic(s): To be determined at a later time   Interventional management options: Ms. Yager was informed that there is no guarantee that she would be a candidate for interventional therapies. The decision will be based on the results of diagnostic studies, as well as Ms. Bruntz's risk profile.  Procedure(s) under consideration:  Diagnostic/therapeutic Lumbar/thoracic epidural steroid injections.    Provider-requested follow-up: Return for (F2F), (2V) post-test eval (40 min), (s/p Tests).  Future Appointments  Date Time Provider [place]  03/02/2020 11:00 AM Milinda Pointer, MD [facility] None  11/03/2020 11:15 AM Ralene Bathe, MD [facility] None    Note by: Gaspar Cola, MD Date: 02/03/2020; Time: 12:38 PM

## 2020-02-02 ENCOUNTER — Ambulatory Visit: Payer: PPO | Admitting: Dermatology

## 2020-02-02 ENCOUNTER — Other Ambulatory Visit: Payer: Self-pay

## 2020-02-02 DIAGNOSIS — L578 Other skin changes due to chronic exposure to nonionizing radiation: Secondary | ICD-10-CM

## 2020-02-02 DIAGNOSIS — L570 Actinic keratosis: Secondary | ICD-10-CM

## 2020-02-02 DIAGNOSIS — L820 Inflamed seborrheic keratosis: Secondary | ICD-10-CM | POA: Diagnosis not present

## 2020-02-02 DIAGNOSIS — E538 Deficiency of other specified B group vitamins: Secondary | ICD-10-CM | POA: Diagnosis not present

## 2020-02-02 DIAGNOSIS — I480 Paroxysmal atrial fibrillation: Secondary | ICD-10-CM | POA: Diagnosis not present

## 2020-02-02 NOTE — Progress Notes (Addendum)
   Follow-Up Visit   Subjective  Elizabeth Fields is a 87 y.o. female who presents for the following: Actinic Keratosis (Bx proven, L calf, pt presents for LN2) and check spot (L pretibial, irritated).  The following portions of the chart were reviewed this encounter and updated as appropriate:   Tobacco  Allergies  Meds  Problems  Med Hx  Surg Hx  Fam Hx     Review of Systems:  No other skin or systemic complaints except as noted in HPI or Assessment and Plan.  Objective  Well appearing patient in no apparent distress; mood and affect are within normal limits.  A focused examination was performed including L leg. Relevant physical exam findings are noted in the Assessment and Plan.  Objective  L calf x 3 (3): Pink scaly macules L calf Pink bx site L calf   Objective  L pretibial x 1: Erythematous keratotic or waxy stuck-on papule or plaque.    Assessment & Plan  AK (actinic keratosis) (3) L calf x 3  Bx proven   Destruction of lesion - L calf x 3 Complexity: simple   Destruction method: cryotherapy   Informed consent: discussed and consent obtained   Timeout:  patient name, date of birth, surgical site, and procedure verified Lesion destroyed using liquid nitrogen: Yes   Region frozen until ice ball extended beyond lesion: Yes   Outcome: patient tolerated procedure well with no complications   Post-procedure details: wound care instructions given    Inflamed seborrheic keratosis L pretibial x 1  Destruction of lesion - L pretibial x 1 Complexity: simple   Destruction method: cryotherapy   Informed consent: discussed and consent obtained   Timeout:  patient name, date of birth, surgical site, and procedure verified Lesion destroyed using liquid nitrogen: Yes   Region frozen until ice ball extended beyond lesion: Yes   Outcome: patient tolerated procedure well with no complications   Post-procedure details: wound care instructions given    Actinic Damage -  chronic, secondary to cumulative UV radiation exposure/sun exposure over time - diffuse scaly erythematous macules with underlying dyspigmentation - Recommend daily broad spectrum sunscreen SPF 30+ to sun-exposed areas, reapply every 2 hours as needed.  - Call for new or changing lesions.  Return for as scheduled for TBSE.  Documentation: I have reviewed the above documentation for accuracy and completeness, and I agree with the above.  Sarina Ser, MD

## 2020-02-03 ENCOUNTER — Ambulatory Visit: Payer: PPO | Admitting: Pain Medicine

## 2020-02-03 ENCOUNTER — Ambulatory Visit
Admission: RE | Admit: 2020-02-03 | Discharge: 2020-02-03 | Disposition: A | Discharge status: Home / Self Care | Payer: PPO | Source: Ambulatory Visit | Attending: Pain Medicine | Admitting: Pain Medicine

## 2020-02-03 ENCOUNTER — Encounter: Payer: Self-pay | Admitting: Pain Medicine

## 2020-02-03 VITALS — BP 109/95 | HR 90 | Temp 97.0°F | Resp 16 | Ht 61.0 in | Wt 92.0 lb

## 2020-02-03 DIAGNOSIS — D631 Anemia in chronic kidney disease: Secondary | ICD-10-CM

## 2020-02-03 DIAGNOSIS — M545 Low back pain, unspecified: Secondary | ICD-10-CM | POA: Insufficient documentation

## 2020-02-03 DIAGNOSIS — M5135 Other intervertebral disc degeneration, thoracolumbar region: Secondary | ICD-10-CM | POA: Insufficient documentation

## 2020-02-03 DIAGNOSIS — Z79899 Other long term (current) drug therapy: Secondary | ICD-10-CM

## 2020-02-03 DIAGNOSIS — R937 Abnormal findings on diagnostic imaging of other parts of musculoskeletal system: Secondary | ICD-10-CM | POA: Insufficient documentation

## 2020-02-03 DIAGNOSIS — Z7901 Long term (current) use of anticoagulants: Secondary | ICD-10-CM

## 2020-02-03 DIAGNOSIS — D649 Anemia, unspecified: Secondary | ICD-10-CM | POA: Diagnosis not present

## 2020-02-03 DIAGNOSIS — M461 Sacroiliitis, not elsewhere classified: Secondary | ICD-10-CM | POA: Diagnosis not present

## 2020-02-03 DIAGNOSIS — N1831 Chronic kidney disease, stage 3a: Secondary | ICD-10-CM | POA: Diagnosis not present

## 2020-02-03 DIAGNOSIS — G8929 Other chronic pain: Secondary | ICD-10-CM

## 2020-02-03 DIAGNOSIS — M546 Pain in thoracic spine: Secondary | ICD-10-CM | POA: Insufficient documentation

## 2020-02-03 DIAGNOSIS — S22080S Wedge compression fracture of T11-T12 vertebra, sequela: Secondary | ICD-10-CM

## 2020-02-03 DIAGNOSIS — G894 Chronic pain syndrome: Secondary | ICD-10-CM

## 2020-02-03 DIAGNOSIS — K449 Diaphragmatic hernia without obstruction or gangrene: Secondary | ICD-10-CM | POA: Insufficient documentation

## 2020-02-03 DIAGNOSIS — M5137 Other intervertebral disc degeneration, lumbosacral region: Secondary | ICD-10-CM

## 2020-02-03 DIAGNOSIS — M51379 Other intervertebral disc degeneration, lumbosacral region without mention of lumbar back pain or lower extremity pain: Secondary | ICD-10-CM

## 2020-02-03 DIAGNOSIS — S32040S Wedge compression fracture of fourth lumbar vertebra, sequela: Secondary | ICD-10-CM

## 2020-02-03 DIAGNOSIS — R944 Abnormal results of kidney function studies: Secondary | ICD-10-CM | POA: Diagnosis not present

## 2020-02-03 DIAGNOSIS — E871 Hypo-osmolality and hyponatremia: Secondary | ICD-10-CM | POA: Insufficient documentation

## 2020-02-03 DIAGNOSIS — S32010S Wedge compression fracture of first lumbar vertebra, sequela: Secondary | ICD-10-CM

## 2020-02-03 DIAGNOSIS — F1120 Opioid dependence, uncomplicated: Secondary | ICD-10-CM | POA: Diagnosis not present

## 2020-02-03 DIAGNOSIS — M899 Disorder of bone, unspecified: Secondary | ICD-10-CM | POA: Insufficient documentation

## 2020-02-03 DIAGNOSIS — Z8739 Personal history of other diseases of the musculoskeletal system and connective tissue: Secondary | ICD-10-CM

## 2020-02-03 DIAGNOSIS — R7989 Other specified abnormal findings of blood chemistry: Secondary | ICD-10-CM | POA: Diagnosis not present

## 2020-02-03 DIAGNOSIS — D519 Vitamin B12 deficiency anemia, unspecified: Secondary | ICD-10-CM | POA: Insufficient documentation

## 2020-02-03 DIAGNOSIS — M47816 Spondylosis without myelopathy or radiculopathy, lumbar region: Secondary | ICD-10-CM | POA: Diagnosis not present

## 2020-02-03 DIAGNOSIS — M533 Sacrococcygeal disorders, not elsewhere classified: Secondary | ICD-10-CM

## 2020-02-03 DIAGNOSIS — Z789 Other specified health status: Secondary | ICD-10-CM

## 2020-02-03 NOTE — Progress Notes (Signed)
Safety precautions to be maintained throughout the outpatient stay will include: orient to surroundings, keep bed in low position, maintain call bell within reach at all times, provide assistance with transfer out of bed and ambulation.  

## 2020-02-03 NOTE — Patient Instructions (Signed)
____________________________________________________________________________________________  Blood Thinners  IMPORTANT NOTICE:  If you take any of these, make sure to notify the nursing staff.  Failure to do so may result in injury.  Recommended time intervals to stop and restart blood-thinners, before & after invasive procedures  Generic Name Brand Name Stop Time. Must be stopped at least this long before procedures. After procedures, wait at least this long before re-starting.  Abciximab Reopro 15 days 2 hrs  Alteplase Activase 10 days 10 days  Anagrelide Agrylin    Apixaban Eliquis 3 days 6 hrs  Cilostazol Pletal 3 days 5 hrs  Clopidogrel Plavix 7-10 days 2 hrs  Dabigatran Pradaxa 5 days 6 hrs  Dalteparin Fragmin 24 hours 4 hrs  Dipyridamole Aggrenox 11days 2 hrs  Edoxaban Lixiana; Savaysa 3 days 2 hrs  Enoxaparin  Lovenox 24 hours 4 hrs  Eptifibatide Integrillin 8 hours 2 hrs  Fondaparinux  Arixtra 72 hours 12 hrs  Prasugrel Effient 7-10 days 6 hrs  Reteplase Retavase 10 days 10 days  Rivaroxaban Xarelto 3 days 6 hrs  Ticagrelor Brilinta 5-7 days 6 hrs  Ticlopidine Ticlid 10-14 days 2 hrs  Tinzaparin Innohep 24 hours 4 hrs  Tirofiban Aggrastat 8 hours 2 hrs  Warfarin Coumadin 5 days 2 hrs   Other medications with blood-thinning effects  Product indications Generic (Brand) names Note  Cholesterol Lipitor Stop 4 days before procedure  Blood thinner (injectable) Heparin (LMW or LMWH Heparin) Stop 24 hours before procedure  Cancer Ibrutinib (Imbruvica) Stop 7 days before procedure  Malaria/Rheumatoid Hydroxychloroquine (Plaquenil) Stop 11 days before procedure  Thrombolytics  10 days before or after procedures   Over-the-counter (OTC) Products with blood-thinning effects  Product Common names Stop Time  Aspirin > 325 mg Goody Powders, Excedrin, etc. 11 days  Aspirin ? 81 mg  7 days  Fish oil  4 days  Garlic supplements  7 days  Ginkgo biloba  36 hours  Ginseng  24  hours  NSAIDs Ibuprofen, Naprosyn, etc. 3 days  Vitamin E  4 days   ____________________________________________________________________________________________  ____________________________________________________________________________________________  General Risks and Possible Complications  Patient Responsibilities: It is important that you read this as it is part of your informed consent. It is our duty to inform you of the risks and possible complications associated with treatments offered to you. It is your responsibility as a patient to read this and to ask questions about anything that is not clear or that you believe was not covered in this document.  Patient's Rights: You have the right to refuse treatment. You also have the right to change your mind, even after initially having agreed to have the treatment done. However, under this last option, if you wait until the last second to change your mind, you may be charged for the materials used up to that point.  Introduction: Medicine is not an Chief Strategy Officer. Everything in Medicine, including the lack of treatment(s), carries the potential for danger, harm, or loss (which is by definition: Risk). In Medicine, a complication is a secondary problem, condition, or disease that can aggravate an already existing one. All treatments carry the risk of possible complications. The fact that a side effects or complications occurs, does not imply that the treatment was conducted incorrectly. It must be clearly understood that these can happen even when everything is done following the highest safety standards.  No treatment: You can choose not to proceed with the proposed treatment alternative. The "PRO(s)" would include: avoiding the risk of complications associated  with the therapy. The "CON(s)" would include: not getting any of the treatment benefits. These benefits fall under one of three categories: diagnostic; therapeutic; and/or palliative.  Diagnostic benefits include: getting information which can ultimately lead to improvement of the disease or symptom(s). Therapeutic benefits are those associated with the successful treatment of the disease. Finally, palliative benefits are those related to the decrease of the primary symptoms, without necessarily curing the condition (example: decreasing the pain from a flare-up of a chronic condition, such as incurable terminal cancer).  General Risks and Complications: These are associated to most interventional treatments. They can occur alone, or in combination. They fall under one of the following six (6) categories: no benefit or worsening of symptoms; bleeding; infection; nerve damage; allergic reactions; and/or death. 1. No benefits or worsening of symptoms: In Medicine there are no guarantees, only probabilities. No healthcare provider can ever guarantee that a medical treatment will work, they can only state the probability that it may. Furthermore, there is always the possibility that the condition may worsen, either directly, or indirectly, as a consequence of the treatment. 2. Bleeding: This is more common if the patient is taking a blood thinner, either prescription or over the counter (example: Goody Powders, Fish oil, Aspirin, Garlic, etc.), or if suffering a condition associated with impaired coagulation (example: Hemophilia, cirrhosis of the liver, low platelet counts, etc.). However, even if you do not have one on these, it can still happen. If you have any of these conditions, or take one of these drugs, make sure to notify your treating physician. 3. Infection: This is more common in patients with a compromised immune system, either due to disease (example: diabetes, cancer, human immunodeficiency virus [HIV], etc.), or due to medications or treatments (example: therapies used to treat cancer and rheumatological diseases). However, even if you do not have one on these, it can still happen.  If you have any of these conditions, or take one of these drugs, make sure to notify your treating physician. 4. Nerve Damage: This is more common when the treatment is an invasive one, but it can also happen with the use of medications, such as those used in the treatment of cancer. The damage can occur to small secondary nerves, or to large primary ones, such as those in the spinal cord and brain. This damage may be temporary or permanent and it may lead to impairments that can range from temporary numbness to permanent paralysis and/or brain death. 5. Allergic Reactions: Any time a substance or material comes in contact with our body, there is the possibility of an allergic reaction. These can range from a mild skin rash (contact dermatitis) to a severe systemic reaction (anaphylactic reaction), which can result in death. 6. Death: In general, any medical intervention can result in death, most of the time due to an unforeseen complication. ____________________________________________________________________________________________

## 2020-02-05 ENCOUNTER — Encounter: Payer: Self-pay | Admitting: Dermatology

## 2020-02-08 LAB — COMP. METABOLIC PANEL (12)
AST: 25 IU/L (ref 0–40)
Albumin/Globulin Ratio: 1.7 (ref 1.2–2.2)
Albumin: 4.9 g/dL — ABNORMAL HIGH (ref 3.6–4.6)
Alkaline Phosphatase: 49 IU/L (ref 44–121)
BUN/Creatinine Ratio: 17 (ref 12–28)
BUN: 23 mg/dL (ref 8–27)
Bilirubin Total: 0.6 mg/dL (ref 0.0–1.2)
Calcium: 9.5 mg/dL (ref 8.7–10.3)
Chloride: 101 mmol/L (ref 96–106)
Creatinine, Ser: 1.36 mg/dL — ABNORMAL HIGH (ref 0.57–1.00)
GFR calc Af Amer: 41 mL/min/{1.73_m2} — ABNORMAL LOW (ref >59)
GFR calc non Af Amer: 35 mL/min/{1.73_m2} — ABNORMAL LOW (ref >59)
Globulin, Total: 2.9 g/dL (ref 1.5–4.5)
Glucose: 101 mg/dL — ABNORMAL HIGH (ref 65–99)
Potassium: 4.5 mmol/L (ref 3.5–5.2)
Sodium: 140 mmol/L (ref 134–144)
Total Protein: 7.8 g/dL (ref 6.0–8.5)

## 2020-02-08 LAB — CBC
Hematocrit: 36.6 % (ref 34.0–46.6)
Hemoglobin: 12.5 g/dL (ref 11.1–15.9)
MCH: 32.6 pg (ref 26.6–33.0)
MCHC: 34.2 g/dL (ref 31.5–35.7)
MCV: 96 fL (ref 79–97)
Platelets: 232 10*3/uL (ref 150–450)
RBC: 3.83 x10E6/uL (ref 3.77–5.28)
RDW: 13.1 % (ref 11.7–15.4)
WBC: 4.6 10*3/uL (ref 3.4–10.8)

## 2020-02-08 LAB — 25-HYDROXY VITAMIN D LCMS D2+D3
25-Hydroxy, Vitamin D-2: <1 ng/mL
25-Hydroxy, Vitamin D-3: 53 ng/mL
25-Hydroxy, Vitamin D: 53 ng/mL

## 2020-02-08 LAB — MAGNESIUM: Magnesium: 2.3 mg/dL (ref 1.6–2.3)

## 2020-02-08 LAB — PROTIME-INR
INR: 2.3 — ABNORMAL HIGH (ref 0.9–1.2)
Prothrombin Time: 23.6 s — ABNORMAL HIGH (ref 9.1–12.0)

## 2020-02-08 LAB — BRAIN NATRIURETIC PEPTIDE: BNP: 34.9 pg/mL (ref 0.0–100.0)

## 2020-02-08 LAB — SEDIMENTATION RATE: Sed Rate: 34 mm/hr (ref 0–40)

## 2020-02-08 LAB — VITAMIN B12: Vitamin B-12: >2000 pg/mL — ABNORMAL HIGH (ref 232–1245)

## 2020-02-08 LAB — APTT: aPTT: 38 s — ABNORMAL HIGH (ref 24–33)

## 2020-02-08 LAB — C-REACTIVE PROTEIN: CRP: <1 mg/L (ref 0–10)

## 2020-02-08 IMAGING — CR DG CHEST 1V
1 series · 1 of 1 positions shown · non-contrast
Comparison: 08/25/2014

CLINICAL DATA: Arrives with spouse who reports that patient fell at
home and injured left hip. Patient C/O left hip pain. Unable to bear
weight. Pain with any movement to left leg.

EXAM:
CHEST  1 VIEW

[chest ap]
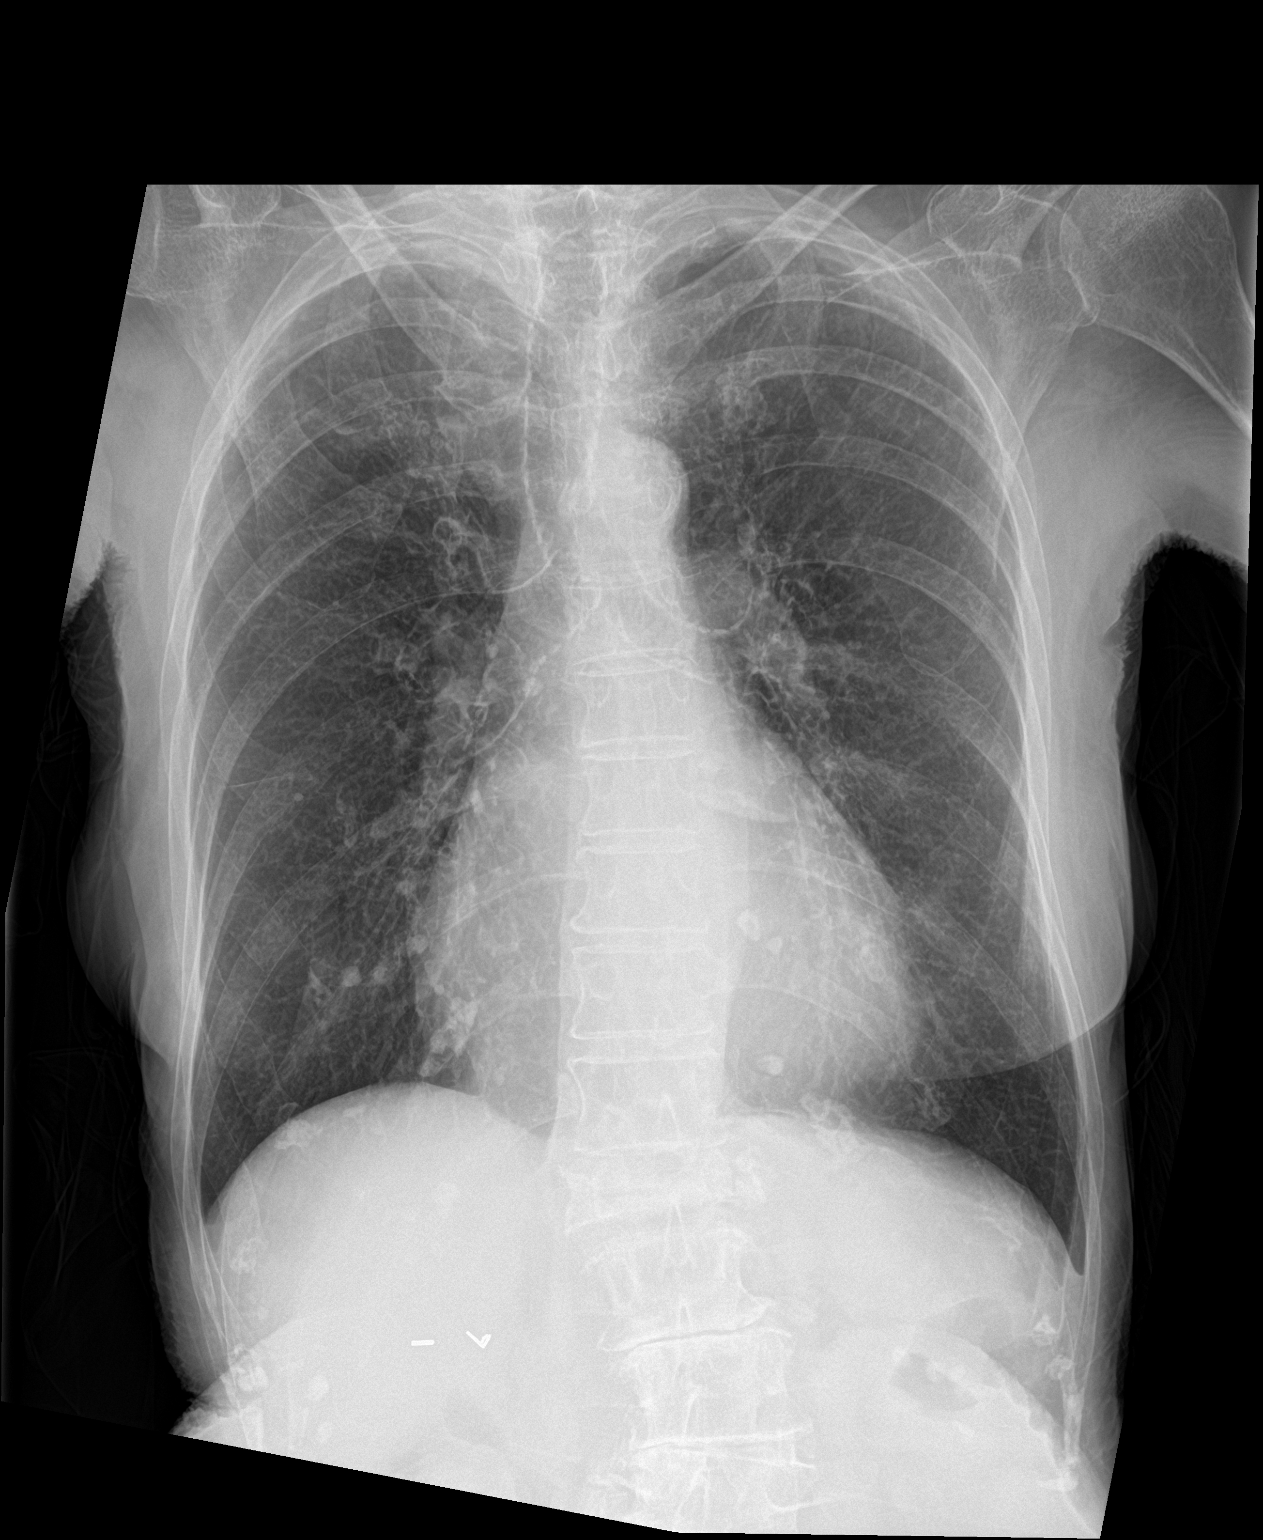

[1 of 1 positions shown; findings below may reference images not displayed]

FINDINGS: There is mild bilateral interstitial thickening likely chronic.
There is no focal consolidation. There is no pleural effusion or
pneumothorax. The heart and mediastinal contours are unremarkable.

There is no acute osseous abnormality. There is an S-shaped
curvature of the thoracolumbar spine.
IMPRESSION: No active disease.

## 2020-02-09 LAB — COMPLIANCE DRUG ANALYSIS, UR

## 2020-02-11 ENCOUNTER — Telehealth: Payer: Self-pay | Admitting: Pain Medicine

## 2020-02-11 IMAGING — XA DG HIP (WITH PELVIS) OPERATIVE*L*
1 series · 1 of 1 positions shown · non-contrast
Comparison: 02/09/2019

CLINICAL DATA: Left hip hemiarthroplasty

EXAM:
OPERATIVE LEFT HIP (WITH PELVIS IF PERFORMED) AP VIEWS
TECHNIQUE: Fluoroscopic spot image(s) were submitted for interpretation
post-operatively.

[Series 5: ortho standard · 1 of 1 slices shown]
[im 1/1]
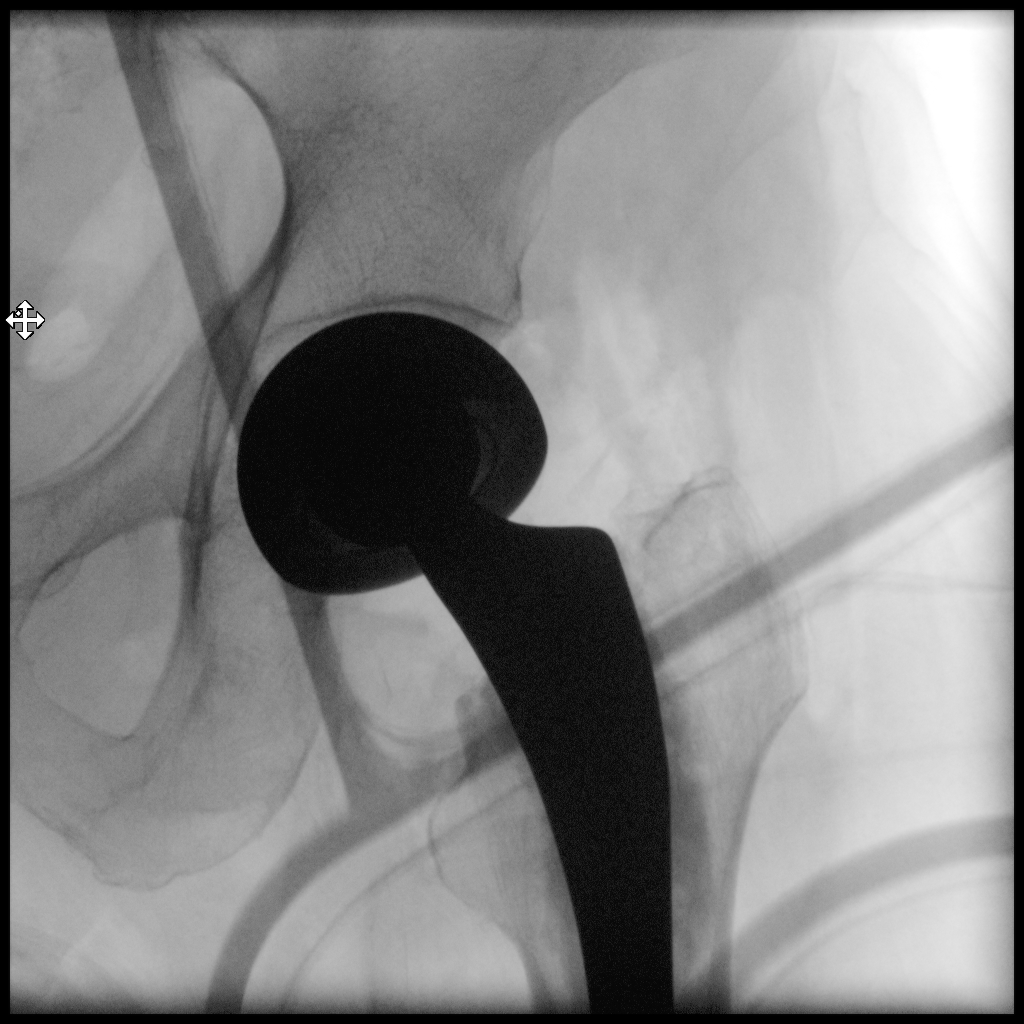

[1 of 1 positions shown; findings below may reference images not displayed]

FINDINGS: A single C-arm fluoroscopic image was obtained intraoperatively and
submitted for post operative interpretation. Partially visualized
left hip hemiarthroplasty hardware, grossly intact. 12 seconds of
fluoroscopy time was utilized. Please see the performing provider's
procedural report for further detail.
IMPRESSION: As above.

## 2020-02-18 ENCOUNTER — Telehealth: Payer: Self-pay | Admitting: Pain Medicine

## 2020-02-18 NOTE — Telephone Encounter (Signed)
error 

## 2020-02-19 NOTE — Telephone Encounter (Signed)
error 

## 2020-03-01 DIAGNOSIS — Z9889 Other specified postprocedural states: Secondary | ICD-10-CM | POA: Diagnosis not present

## 2020-03-01 DIAGNOSIS — I493 Ventricular premature depolarization: Secondary | ICD-10-CM | POA: Diagnosis not present

## 2020-03-01 DIAGNOSIS — E785 Hyperlipidemia, unspecified: Secondary | ICD-10-CM | POA: Diagnosis not present

## 2020-03-01 DIAGNOSIS — I480 Paroxysmal atrial fibrillation: Secondary | ICD-10-CM | POA: Diagnosis not present

## 2020-03-01 DIAGNOSIS — I340 Nonrheumatic mitral (valve) insufficiency: Secondary | ICD-10-CM | POA: Diagnosis not present

## 2020-03-01 DIAGNOSIS — I351 Nonrheumatic aortic (valve) insufficiency: Secondary | ICD-10-CM | POA: Diagnosis not present

## 2020-03-01 DIAGNOSIS — I341 Nonrheumatic mitral (valve) prolapse: Secondary | ICD-10-CM | POA: Diagnosis not present

## 2020-03-01 NOTE — Progress Notes (Signed)
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 12:42  Complexity Note: Imaging results reviewed. Results shared with Ms. Every, using Layman's terms.                        Meds   Current Outpatient Medications:  .  acetaminophen (TYLENOL) 500 MG tablet, Take 1-2 tablets (500-1,000 mg total) by mouth every 8 (eight) hours as needed for mild pain or moderate pain., Disp: 30 tablet, Rfl: 0 .  aspirin EC 81 MG tablet, Take 81 mg by mouth daily. Swallow whole., Disp: , Rfl:  .  Cholecalciferol 50 MCG (2000 UT) CAPS, Take 2,000 Units by mouth daily., Disp: , Rfl:  .  doxepin (SINEQUAN) 10 MG capsule, Take 10 mg by mouth daily., Disp: , Rfl:  .  esomeprazole (NEXIUM) 40 MG capsule, Take 40 mg by mouth daily at 12 noon., Disp: , Rfl:  .  feeding supplement, ENSURE ENLIVE, (ENSURE ENLIVE) LIQD, Take 237 mLs by mouth 2 (two)  times daily between meals., Disp: 237 mL, Rfl: 12 .  ferrous [id number] C-folic acid (TRINSICON / FOLTRIN) capsule, Take 1 capsule by mouth 2 (two) times daily., Disp: 30 capsule, Rfl: 0 .  furosemide (LASIX) 20 MG tablet, Take 1 tablet (20 mg total) by mouth daily., Disp: 30 tablet, Rfl: 0 .  levothyroxine (SYNTHROID, LEVOTHROID) 100 MCG tablet, Take 100 mcg by mouth daily before breakfast., Disp: , Rfl:  .  magnesium oxide (MAG-OX) 400 MG tablet, Take 400 mg by mouth daily., Disp: , Rfl:  .  metoprolol succinate (TOPROL-XL) 50 MG 24 hr tablet, Take 100 mg by mouth daily. Take with or immediately following a meal., Disp: , Rfl:  .  simvastatin (ZOCOR) 20 MG tablet, Take 20 mg by mouth daily., Disp: , Rfl:  .  traMADol (ULTRAM) 50 MG tablet, Take  by mouth., Disp: , Rfl:  .  warfarin (COUMADIN) 4 MG tablet, Take 4 mg by mouth daily., Disp: , Rfl:  .  HYDROcodone-acetaminophen (NORCO/VICODIN) 5-325 MG tablet, Take 0.5 tablets by mouth every 6 (six) hours as needed for moderate pain or severe pain. (Patient not taking: No sig reported), Disp: 20 tablet, Rfl: 0  ROS  Constitutional: Denies any fever or chills Gastrointestinal: No reported hemesis, hematochezia, vomiting, or acute GI distress Musculoskeletal: Denies any acute onset joint swelling, redness, loss of ROM, or weakness Neurological: No reported episodes of acute onset apraxia, aphasia, dysarthria, agnosia, amnesia, paralysis, loss of coordination, or loss of consciousness  Allergies  Ms. Devino is allergic to fosamax [alendronate sodium], other, penicillins, and tramadol.  PFSH  Drug: Ms. Kaplan  reports no history of drug use. Alcohol:  reports no history of alcohol use. Tobacco:  reports that she has never smoked. She has never used smokeless tobacco. Medical:  has a past medical history of Actinic keratosis, Actinic keratosis (11/02/2019), Allergy, Atrial fibrillation ([place]), CHF (congestive heart failure) ([place]), History of  squamous cell carcinoma, Hyperlipidemia, Hypertension, Renal insufficiency, Squamous cell carcinoma of skin (02/25/2017), and Thyroid disease. Surgical: Ms. Ishler  has a past surgical history that includes Cholecystectomy; Hip surgery; and Anterior approach hemi hip arthroplasty (Left, 02/12/2019). Family: family history includes Breast cancer (age of onset: 80) in her mother; Heart disease in her father.  Constitutional Exam  General appearance: Well nourished, well developed, and well hydrated. In no apparent acute distress Vitals:   03/02/20 1101  BP: 119/70  Pulse: 96  Resp: 18  Temp: (!) 97 F (36.1 C)  SpO2: 100%  Weight: 93 lb (42.2 kg)  Height: '4\' 11"'  (1.499 m)   BMI Assessment: Estimated body mass index is 18.78 kg/m as calculated from the following:   Height as of this encounter: '4\' 11"'  (1.499 m).   Weight as of this encounter: 93 lb (42.2 kg).  BMI interpretation table: BMI level Category Range association with higher incidence of chronic pain  <18 kg/m2 Underweight   18.5-24.9 kg/m2 Ideal body weight   25-29.9 kg/m2 Overweight Increased incidence by 20%  30-34.9 kg/m2 Obese (Class I) Increased incidence by 68%  35-39.9 kg/m2 Severe obesity (Class II) Increased incidence by 136%  >40 kg/m2 Extreme obesity (Class III) Increased incidence by 254%   Patient's current BMI Ideal Body weight  Body mass index is 18.78 kg/m. Female patients must weigh at least 45.5 kg to calculate ideal body weight   BMI Readings from Last 4 Encounters:  03/02/20 18.78 kg/m  02/03/20 17.38 kg/m  02/17/19 23.39 kg/m  09/29/15 19.65 kg/m   Wt Readings from Last 4 Encounters:  03/02/20 93 lb (42.2 kg)  02/03/20 92 lb (41.7 kg)  02/17/19 127 lb 13.9 oz (58 kg)  09/29/15 104 lb (47.2 kg)    Psych/Mental status: Alert, oriented x 3 (person, place, & time)       Eyes: PERLA Respiratory: No evidence of acute respiratory distress  Assessment & Plan  Primary Diagnosis & Pertinent Problem  List: The primary encounter diagnosis was Chronic pain syndrome. Diagnoses of Chronic low back pain (1ry area of Pain) (Bilateral) w/o sciatica, Lumbar facet syndrome (Bilateral), Chronic sacroiliac joint pain (Bilateral), DDD (degenerative disc disease), cervical, Compression fracture of L1 vertebra, sequela , Compression fracture of L4 vertebra, sequela, Osteoarthritis of sacroiliac joints (HCC) (Bilateral), Atherosclerotic peripheral vascular disease ([place]), and Chronic anticoagulation (Coumadin) were also pertinent to this visit.  Visit Diagnosis: 1. Chronic  pain syndrome   2. Chronic low back pain (1ry area of Pain) (Bilateral) w/o sciatica   3. Lumbar facet syndrome (Bilateral)   4. Chronic sacroiliac joint pain (Bilateral)   5. DDD (degenerative disc disease), cervical   6. Compression fracture of L1 vertebra, sequela    7. Compression fracture of L4 vertebra, sequela   8. Osteoarthritis of sacroiliac joints (HCC) (Bilateral)   9. Atherosclerotic peripheral vascular disease ([place])   10. Chronic anticoagulation (Coumadin)    Problems updated and reviewed during this visit: Problem  Ddd (Degenerative Disc Disease), Cervical  Compression fracture of L4 vertebra, sequela   Previously seen on 09 617 Lumbar CT with inferior endplate compression and 20% loss observed at that time.   Compression fracture of L1 vertebra, sequela    Last seen on 02/03/2020 thoracic x-ray, with significant interval height loss since 2020 CT scan.  Fracture was also documented on 09/14/2015 Lumbar CT.   Chronic low back pain (1ry area of Pain) (Bilateral) w/o sciatica  Atherosclerotic Peripheral Vascular Disease (Hcc)  Bergmann's syndrome (AKA: Fat Embolism Syndrome)   Diagnosis remains clinical. Bergman's triad: dyspnea, neurological impairment and petechial rash in a concordat scenario is pathognomonic.     Plan of Care  Pharmacotherapy (Medications Ordered): No orders of the defined types were placed in  this encounter.   Procedure Orders     LUMBAR FACET(MEDIAL BRANCH NERVE BLOCK) MBNB Lab Orders  No laboratory test(s) ordered today   Imaging Orders  No imaging studies ordered today   Referral Orders  No referral(s) requested today    Pharmacological management options:  Opioid Analgesics: We'll take over management today. See above orders Membrane stabilizer: Options discussed, including a trial. Muscle relaxant: We have discussed the possibility of a trial NSAID: Trial discussed. Other analgesic(s): To be determined at a later time     Interventional Therapies  Risk  Complexity Considerations:   NOTE: Coumadin anticoagulation (Stop: 5 days  Restart: 2 hrs)   Planned  Pending:   Diagnostic bilateral lumbar facet block #1    Under consideration:   Diagnostic/therapeutic Lumbar/thoracic epidural steroid injections.   Completed:   None at this time   Therapeutic  Palliative (PRN) options:   None established    Provider-requested follow-up: Return for Procedure (w/ sedation): (B) L-FCT BLK #1, (Blood Thinner Protocol). Recent Visits Date Type Provider Dept  02/03/20 Office Visit Milinda Pointer, MD [facility]  Showing recent visits within past 90 days and meeting all other requirements Today's Visits Date Type Provider Dept  03/02/20 Office Visit Milinda Pointer, MD [facility]  Showing today's visits and meeting all other requirements Future Appointments Date Type Provider Dept  03/08/20 Appointment Milinda Pointer, MD [facility]  Showing future appointments within next 90 days and meeting all other requirements  Primary Care Physician: Baxter Hire, MD Note by: Gaspar Cola, MD Date: 03/02/2020; Time: 12:45 PM

## 2020-03-02 ENCOUNTER — Other Ambulatory Visit: Payer: Self-pay

## 2020-03-02 ENCOUNTER — Encounter: Payer: Self-pay | Admitting: Pain Medicine

## 2020-03-02 ENCOUNTER — Ambulatory Visit: Payer: PPO | Attending: Pain Medicine | Admitting: Pain Medicine

## 2020-03-02 VITALS — BP 119/70 | HR 96 | Temp 97.0°F | Resp 18 | Ht 59.0 in | Wt 93.0 lb

## 2020-03-02 DIAGNOSIS — M47816 Spondylosis without myelopathy or radiculopathy, lumbar region: Secondary | ICD-10-CM | POA: Diagnosis not present

## 2020-03-02 DIAGNOSIS — S32010S Wedge compression fracture of first lumbar vertebra, sequela: Secondary | ICD-10-CM | POA: Insufficient documentation

## 2020-03-02 DIAGNOSIS — M461 Sacroiliitis, not elsewhere classified: Secondary | ICD-10-CM | POA: Insufficient documentation

## 2020-03-02 DIAGNOSIS — M533 Sacrococcygeal disorders, not elsewhere classified: Secondary | ICD-10-CM | POA: Diagnosis not present

## 2020-03-02 DIAGNOSIS — Z7901 Long term (current) use of anticoagulants: Secondary | ICD-10-CM | POA: Insufficient documentation

## 2020-03-02 DIAGNOSIS — M5030 Other cervical disc degeneration, unspecified cervical region: Secondary | ICD-10-CM | POA: Diagnosis not present

## 2020-03-02 DIAGNOSIS — S32040S Wedge compression fracture of fourth lumbar vertebra, sequela: Secondary | ICD-10-CM | POA: Diagnosis not present

## 2020-03-02 DIAGNOSIS — I70209 Unspecified atherosclerosis of native arteries of extremities, unspecified extremity: Secondary | ICD-10-CM | POA: Diagnosis not present

## 2020-03-02 DIAGNOSIS — G8929 Other chronic pain: Secondary | ICD-10-CM | POA: Insufficient documentation

## 2020-03-02 DIAGNOSIS — M545 Low back pain, unspecified: Secondary | ICD-10-CM | POA: Insufficient documentation

## 2020-03-02 DIAGNOSIS — G894 Chronic pain syndrome: Secondary | ICD-10-CM | POA: Insufficient documentation

## 2020-03-02 NOTE — Progress Notes (Signed)
Nursing Pain Medication Assessment:  Safety precautions to be maintained throughout the outpatient stay will include: orient to surroundings, keep bed in low position, maintain call bell within reach at all times, provide assistance with transfer out of bed and ambulation.  Medication Inspection Compliance: Elizabeth Fields did not comply with our request to bring her pills to be counted. She was reminded that bringing the medication bottles, even when empty, is a requirement.  Medication: None brought in. Pill/Patch Count: None available to be counted. Bottle Appearance: No container available. Did not bring bottle(s) to appointment. Filled Date: N/A Last Medication intake:  unknown

## 2020-03-02 NOTE — Patient Instructions (Signed)
____________________________________________________________________________________________  Blood Thinners  IMPORTANT NOTICE:  If you take any of these, make sure to notify the nursing staff.  Failure to do so may result in injury.  Recommended time intervals to stop and restart blood-thinners, before & after invasive procedures  Generic Name Brand Name Stop Time. Must be stopped at least this long before procedures. After procedures, wait at least this long before re-starting.  Abciximab Reopro 15 days 2 hrs  Alteplase Activase 10 days 10 days  Anagrelide Agrylin    Apixaban Eliquis 3 days 6 hrs  Cilostazol Pletal 3 days 5 hrs  Clopidogrel Plavix 7-10 days 2 hrs  Dabigatran Pradaxa 5 days 6 hrs  Dalteparin Fragmin 24 hours 4 hrs  Dipyridamole Aggrenox 11days 2 hrs  Edoxaban Lixiana; Savaysa 3 days 2 hrs  Enoxaparin  Lovenox 24 hours 4 hrs  Eptifibatide Integrillin 8 hours 2 hrs  Fondaparinux  Arixtra 72 hours 12 hrs  Hydroxychloroquine Plaquenil 11 days   Prasugrel Effient 7-10 days 6 hrs  Reteplase Retavase 10 days 10 days  Rivaroxaban Xarelto 3 days 6 hrs  Ticagrelor Brilinta 5-7 days 6 hrs  Ticlopidine Ticlid 10-14 days 2 hrs  Tinzaparin Innohep 24 hours 4 hrs  Tirofiban Aggrastat 8 hours 2 hrs  Warfarin Coumadin 5 days 2 hrs   Other medications with blood-thinning effects  Product indications Generic (Brand) names Note  Cholesterol Lipitor Stop 4 days before procedure  Blood thinner (injectable) Heparin (LMW or LMWH Heparin) Stop 24 hours before procedure  Cancer Ibrutinib (Imbruvica) Stop 7 days before procedure  Malaria/Rheumatoid Hydroxychloroquine (Plaquenil) Stop 11 days before procedure  Thrombolytics  10 days before or after procedures   Over-the-counter (OTC) Products with blood-thinning effects  Product Common names Stop Time  Aspirin > 325 mg Goody Powders, Excedrin, etc. 11 days  Aspirin ? 81 mg  7 days  Fish oil  4 days  Garlic supplements  7 days   Ginkgo biloba  36 hours  Ginseng  24 hours  NSAIDs Ibuprofen, Naprosyn, etc. 3 days  Vitamin E  4 days   ____________________________________________________________________________________________  ____________________________________________________________________________________________  Preparing for Procedure with Sedation  Procedure appointments are limited to planned procedures: . No Prescription Refills. . No disability issues will be discussed. . No medication changes will be discussed.  Instructions: . Oral Intake: Do not eat or drink anything for at least 8 hours prior to your procedure. (Exception: Blood Pressure Medication. See below.) . Transportation: Unless otherwise stated by your physician, you may drive yourself after the procedure. . Blood Pressure Medicine: Do not forget to take your blood pressure medicine with a sip of water the morning of the procedure. If your Diastolic (lower reading)is above 100 mmHg, elective cases will be cancelled/rescheduled. . Blood thinners: These will need to be stopped for procedures. Notify our staff if you are taking any blood thinners. Depending on which one you take, there will be specific instructions on how and when to stop it. . Diabetics on insulin: Notify the staff so that you can be scheduled 1st case in the morning. If your diabetes requires high dose insulin, take only  of your normal insulin dose the morning of the procedure and notify the staff that you have done so. . Preventing infections: Shower with an antibacterial soap the morning of your procedure. . Build-up your immune system: Take 1000 mg of Vitamin C with every meal (3 times a day) the day prior to your procedure. . Antibiotics: Inform the staff if you have   a condition or reason that requires you to take antibiotics before dental procedures. . Pregnancy: If you are pregnant, call and cancel the procedure. . Sickness: If you have a cold, fever, or any active  infections, call and cancel the procedure. . Arrival: You must be in the facility at least 30 minutes prior to your scheduled procedure. . Children: Do not bring children with you. . Dress appropriately: Bring dark clothing that you would not mind if they get stained. . Valuables: Do not bring any jewelry or valuables.  Reasons to call and reschedule or cancel your procedure: (Following these recommendations will minimize the risk of a serious complication.) . Surgeries: Avoid having procedures within 2 weeks of any surgery. (Avoid for 2 weeks before or after any surgery). . Flu Shots: Avoid having procedures within 2 weeks of a flu shots or . (Avoid for 2 weeks before or after immunizations). . Barium: Avoid having a procedure within 7-10 days after having had a radiological study involving the use of radiological contrast. (Myelograms, Barium swallow or enema study). . Heart attacks: Avoid any elective procedures or surgeries for the initial 6 months after a "Myocardial Infarction" (Heart Attack). . Blood thinners: It is imperative that you stop these medications before procedures. Let us know if you if you take any blood thinner.  . Infection: Avoid procedures during or within two weeks of an infection (including chest colds or gastrointestinal problems). Symptoms associated with infections include: Localized redness, fever, chills, night sweats or profuse sweating, burning sensation when voiding, cough, congestion, stuffiness, runny nose, sore throat, diarrhea, nausea, vomiting, cold or Flu symptoms, recent or current infections. It is specially important if the infection is over the area that we intend to treat. . Heart and lung problems: Symptoms that may suggest an active cardiopulmonary problem include: cough, chest pain, breathing difficulties or shortness of breath, dizziness, ankle swelling, uncontrolled high or unusually low blood pressure, and/or palpitations. If you are experiencing any of  these symptoms, cancel your procedure and contact your primary care physician for an evaluation.  Remember:  Regular Business hours are:  Monday to Thursday 8:00 AM to 4:00 PM  Provider's Schedule: Zamaria Brazzle, MD:  Procedure days: Tuesday and Thursday 7:30 AM to 4:00 PM  Bilal Lateef, MD:  Procedure days: Monday and Wednesday 7:30 AM to 4:00 PM ____________________________________________________________________________________________   ____________________________________________________________________________________________  General Risks and Possible Complications  Patient Responsibilities: It is important that you read this as it is part of your informed consent. It is our duty to inform you of the risks and possible complications associated with treatments offered to you. It is your responsibility as a patient to read this and to ask questions about anything that is not clear or that you believe was not covered in this document.  Patient's Rights: You have the right to refuse treatment. You also have the right to change your mind, even after initially having agreed to have the treatment done. However, under this last option, if you wait until the last second to change your mind, you may be charged for the materials used up to that point.  Introduction: Medicine is not an exact science. Everything in Medicine, including the lack of treatment(s), carries the potential for danger, harm, or loss (which is by definition: Risk). In Medicine, a complication is a secondary problem, condition, or disease that can aggravate an already existing one. All treatments carry the risk of possible complications. The fact that a side effects or complications occurs, does   not imply that the treatment was conducted incorrectly. It must be clearly understood that these can happen even when everything is done following the highest safety standards.  No treatment: You can choose not to proceed with  the proposed treatment alternative. The "PRO(s)" would include: avoiding the risk of complications associated with the therapy. The "CON(s)" would include: not getting any of the treatment benefits. These benefits fall under one of three categories: diagnostic; therapeutic; and/or palliative. Diagnostic benefits include: getting information which can ultimately lead to improvement of the disease or symptom(s). Therapeutic benefits are those associated with the successful treatment of the disease. Finally, palliative benefits are those related to the decrease of the primary symptoms, without necessarily curing the condition (example: decreasing the pain from a flare-up of a chronic condition, such as incurable terminal cancer).  General Risks and Complications: These are associated to most interventional treatments. They can occur alone, or in combination. They fall under one of the following six (6) categories: no benefit or worsening of symptoms; bleeding; infection; nerve damage; allergic reactions; and/or death. 1. No benefits or worsening of symptoms: In Medicine there are no guarantees, only probabilities. No healthcare provider can ever guarantee that a medical treatment will work, they can only state the probability that it may. Furthermore, there is always the possibility that the condition may worsen, either directly, or indirectly, as a consequence of the treatment. 2. Bleeding: This is more common if the patient is taking a blood thinner, either prescription or over the counter (example: Goody Powders, Fish oil, Aspirin, Garlic, etc.), or if suffering a condition associated with impaired coagulation (example: Hemophilia, cirrhosis of the liver, low platelet counts, etc.). However, even if you do not have one on these, it can still happen. If you have any of these conditions, or take one of these drugs, make sure to notify your treating physician. 3. Infection: This is more common in patients with a  compromised immune system, either due to disease (example: diabetes, cancer, human immunodeficiency virus [HIV], etc.), or due to medications or treatments (example: therapies used to treat cancer and rheumatological diseases). However, even if you do not have one on these, it can still happen. If you have any of these conditions, or take one of these drugs, make sure to notify your treating physician. 4. Nerve Damage: This is more common when the treatment is an invasive one, but it can also happen with the use of medications, such as those used in the treatment of cancer. The damage can occur to small secondary nerves, or to large primary ones, such as those in the spinal cord and brain. This damage may be temporary or permanent and it may lead to impairments that can range from temporary numbness to permanent paralysis and/or brain death. 5. Allergic Reactions: Any time a substance or material comes in contact with our body, there is the possibility of an allergic reaction. These can range from a mild skin rash (contact dermatitis) to a severe systemic reaction (anaphylactic reaction), which can result in death. 6. Death: In general, any medical intervention can result in death, most of the time due to an unforeseen complication. ____________________________________________________________________________________________   

## 2020-03-04 DIAGNOSIS — E538 Deficiency of other specified B group vitamins: Secondary | ICD-10-CM | POA: Diagnosis not present

## 2020-03-08 ENCOUNTER — Ambulatory Visit (HOSPITAL_BASED_OUTPATIENT_CLINIC_OR_DEPARTMENT_OTHER): Payer: PPO | Admitting: Pain Medicine

## 2020-03-08 ENCOUNTER — Encounter: Payer: Self-pay | Admitting: Pain Medicine

## 2020-03-08 ENCOUNTER — Ambulatory Visit
Admission: RE | Admit: 2020-03-08 | Discharge: 2020-03-08 | Disposition: A | Discharge status: Home / Self Care | Payer: PPO | Source: Ambulatory Visit | Attending: Pain Medicine | Admitting: Pain Medicine

## 2020-03-08 ENCOUNTER — Other Ambulatory Visit: Payer: Self-pay

## 2020-03-08 VITALS — BP 165/76 | HR 92 | Temp 97.0°F | Resp 18 | Ht 59.0 in | Wt 93.0 lb

## 2020-03-08 DIAGNOSIS — M47816 Spondylosis without myelopathy or radiculopathy, lumbar region: Secondary | ICD-10-CM | POA: Diagnosis not present

## 2020-03-08 DIAGNOSIS — M5137 Other intervertebral disc degeneration, lumbosacral region: Secondary | ICD-10-CM

## 2020-03-08 DIAGNOSIS — M47817 Spondylosis without myelopathy or radiculopathy, lumbosacral region: Secondary | ICD-10-CM | POA: Insufficient documentation

## 2020-03-08 DIAGNOSIS — Z7901 Long term (current) use of anticoagulants: Secondary | ICD-10-CM

## 2020-03-08 DIAGNOSIS — M545 Low back pain, unspecified: Secondary | ICD-10-CM | POA: Diagnosis not present

## 2020-03-08 DIAGNOSIS — G8929 Other chronic pain: Secondary | ICD-10-CM

## 2020-03-08 MED ORDER — TRIAMCINOLONE ACETONIDE 40 MG/ML IJ SUSP
80.0000 mg | Freq: Once | INTRAMUSCULAR | Status: AC
  Administered 2020-03-08: 80 mg

## 2020-03-08 MED ORDER — LIDOCAINE HCL (PF) 2 % IJ SOLN
INTRAMUSCULAR | Status: AC
  Filled 2020-03-08: qty 10

## 2020-03-08 MED ORDER — LIDOCAINE HCL 2 % IJ SOLN
20.0000 mL | Freq: Once | INTRAMUSCULAR | Status: AC
  Administered 2020-03-08: 200 mg

## 2020-03-08 MED ORDER — FENTANYL CITRATE (PF) 100 MCG/2ML IJ SOLN: 25.0000 ug | INTRAMUSCULAR | Status: DC | PRN

## 2020-03-08 MED ORDER — LACTATED RINGERS IV SOLN: 1000.0000 mL | Freq: Once | INTRAVENOUS | Status: DC

## 2020-03-08 MED ORDER — ROPIVACAINE HCL 2 MG/ML IJ SOLN
INTRAMUSCULAR | Status: AC
  Filled 2020-03-08: qty 20

## 2020-03-08 MED ORDER — MIDAZOLAM HCL 5 MG/5ML IJ SOLN: 1.0000 mg | INTRAMUSCULAR | Status: DC | PRN

## 2020-03-08 MED ORDER — TRIAMCINOLONE ACETONIDE 40 MG/ML IJ SUSP
INTRAMUSCULAR | Status: AC
  Filled 2020-03-08: qty 2

## 2020-03-08 MED ORDER — ROPIVACAINE HCL 2 MG/ML IJ SOLN
18.0000 mL | Freq: Once | INTRAMUSCULAR | Status: AC
  Administered 2020-03-08: 18 mL via PERINEURAL

## 2020-03-08 NOTE — Progress Notes (Signed)
PROVIDER NOTE: Information contained herein reflects review and annotations entered in association with encounter. Interpretation of such information and data should be left to medically-trained personnel. Information provided to patient can be located elsewhere in the medical record under "Patient Instructions". Document created using STT-dictation technology, any transcriptional errors that may result from process are unintentional.    Patient: Elizabeth Fields  Service Category: Procedure  Provider: Gaspar Cola, MD  DOB: 06-Oct-1931  DOS: 03/08/2020  Location: [facility]  MRN: [id number]  Setting: Ambulatory - outpatient  Referring Provider: Baxter Hire, MD  Type: Established Patient  Specialty: Interventional Pain Management  PCP: Baxter Hire, MD   Primary Reason for Visit: Interventional Pain Management Treatment. CC: Back Pain (Lower, middle)  Procedure:          Anesthesia, Analgesia, Anxiolysis:  Type: Lumbar Facet, Medial Branch Block(s) #1  Primary Purpose: Diagnostic Region: Posterolateral Lumbosacral Spine Level: L2, L3, L4, L5, & S1 Medial Branch Level(s). Injecting these levels blocks the L3-4, L4-5, and L5-S1 lumbar facet joints. Laterality: Bilateral  Type: Local Anesthesia Indication(s): Analgesia         Route: Infiltration (/IM) IV Access: Declined Sedation: Declined  Local Anesthetic: Lidocaine 1-2%  Position: Prone   Indications: 1. Lumbar facet syndrome (Bilateral)   2. Spondylosis without myelopathy or radiculopathy, lumbosacral region   3. DDD (degenerative disc disease), lumbosacral   4. Chronic low back pain (1ry area of Pain) (Bilateral) w/o sciatica   5. Osteoarthritis of facet joint of lumbar spine   6. Chronic anticoagulation (Coumadin)    Pain Score: Pre-procedure: 5 /10 Post-procedure: 0-No pain/10   Pre-op H&P Assessment:  Ms. Greb is a 87 y.o. (year old), female patient, seen today for interventional treatment. She   has a past surgical history that includes Cholecystectomy; Hip surgery; and Anterior approach hemi hip arthroplasty (Left, 02/12/2019). Ms. Strong has a current medication list which includes the following prescription(s): acetaminophen, aspirin ec, cholecalciferol, doxepin, esomeprazole, feeding supplement, ferrous [id number] c-folic acid, furosemide, levothyroxine, magnesium oxide, metoprolol succinate, simvastatin, tramadol, and warfarin. Her primarily concern today is the Back Pain (Lower, middle)  Initial Vital Signs:  Pulse/HCG Rate: 92ECG Heart Rate: 96 Temp: (!) 97 F (36.1 C) Resp: 16 BP: 134/63 SpO2: 98 %  BMI: Estimated body mass index is 18.78 kg/m as calculated from the following:   Height as of this encounter: 4\' 11"  (1.499 m).   Weight as of this encounter: 93 lb (42.2 kg).  Risk Assessment: Allergies: Reviewed. She is allergic to fosamax [alendronate sodium], other, and penicillins.  Allergy Precautions: None required Coagulopathies: Reviewed. None identified.  Blood-thinner therapy: None at this time Active Infection(s): Reviewed. None identified. Ms. Desmarais is afebrile  Site Confirmation: Ms. Harlan was asked to confirm the procedure and laterality before marking the site Procedure checklist: Completed Consent: Before the procedure and under the influence of no sedative(s), amnesic(s), or anxiolytics, the patient was informed of the treatment options, risks and possible complications. To fulfill our ethical and legal obligations, as recommended by the American Medical Association's Code of Ethics, I have informed the patient of my clinical impression; the nature and purpose of the treatment or procedure; the risks, benefits, and possible complications of the intervention; the alternatives, including doing nothing; the risk(s) and benefit(s) of the alternative treatment(s) or procedure(s); and the risk(s) and benefit(s) of doing nothing. The patient was provided information  about the general risks and possible complications associated with the procedure. These may include,  but are not limited to: failure to achieve desired goals, infection, bleeding, organ or nerve damage, allergic reactions, paralysis, and death. In addition, the patient was informed of those risks and complications associated to Spine-related procedures, such as failure to decrease pain; infection (i.e.: Meningitis, epidural or intraspinal abscess); bleeding (i.e.: epidural hematoma, subarachnoid hemorrhage, or any other type of intraspinal or peri-dural bleeding); organ or nerve damage (i.e.: Any type of peripheral nerve, nerve root, or spinal cord injury) with subsequent damage to sensory, motor, and/or autonomic systems, resulting in permanent pain, numbness, and/or weakness of one or several areas of the body; allergic reactions; (i.e.: anaphylactic reaction); and/or death. Furthermore, the patient was informed of those risks and complications associated with the medications. These include, but are not limited to: allergic reactions (i.e.: anaphylactic or anaphylactoid reaction(s)); adrenal axis suppression; blood sugar elevation that in diabetics may result in ketoacidosis or comma; water retention that in patients with history of congestive heart failure may result in shortness of breath, pulmonary edema, and decompensation with resultant heart failure; weight gain; swelling or edema; medication-induced neural toxicity; particulate matter embolism and blood vessel occlusion with resultant organ, and/or nervous system infarction; and/or aseptic necrosis of one or more joints. Finally, the patient was informed that Medicine is not an exact science; therefore, there is also the possibility of unforeseen or unpredictable risks and/or possible complications that may result in a catastrophic outcome. The patient indicated having understood very clearly. We have given the patient no guarantees and we have made no  promises. Enough time was given to the patient to ask questions, all of which were answered to the patient's satisfaction. Ms. Jaskulski has indicated that she wanted to continue with the procedure. Attestation: I, the ordering provider, attest that I have discussed with the patient the benefits, risks, side-effects, alternatives, likelihood of achieving goals, and potential problems during recovery for the procedure that I have provided informed consent. Date  Time: 03/08/2020 10:56 AM  Pre-Procedure Preparation:  Monitoring: As per clinic protocol. Respiration, ETCO2, SpO2, BP, heart rate and rhythm monitor placed and checked for adequate function Safety Precautions: Patient was assessed for positional comfort and pressure points before starting the procedure. Time-out: I initiated and conducted the "Time-out" before starting the procedure, as per protocol. The patient was asked to participate by confirming the accuracy of the "Time Out" information. Verification of the correct person, site, and procedure were performed and confirmed by me, the nursing staff, and the patient. "Time-out" conducted as per Joint Commission's Universal Protocol (UP.01.01.01). Time: 1139  Description of Procedure:          Laterality: Bilateral. The procedure was performed in identical fashion on both sides. Levels:  L2, L3, L4, L5, & S1 Medial Branch Level(s) Area Prepped: Posterior Lumbosacral Region DuraPrep (Iodine Povacrylex [0.7% available iodine] and Isopropyl Alcohol, 74% w/w) Safety Precautions: Aspiration looking for blood return was conducted prior to all injections. At no point did we inject any substances, as a needle was being advanced. Before injecting, the patient was told to immediately notify me if she was experiencing any new onset of "ringing in the ears, or metallic taste in the mouth". No attempts were made at seeking any paresthesias. Safe injection practices and needle disposal techniques used.  Medications properly checked for expiration dates. SDV (single dose vial) medications used. After the completion of the procedure, all disposable equipment used was discarded in the proper designated medical waste containers. Local Anesthesia: Protocol guidelines were followed. The patient was positioned  over the fluoroscopy table. The area was prepped in the usual manner. The time-out was completed. The target area was identified using fluoroscopy. A 12-in long, straight, sterile hemostat was used with fluoroscopic guidance to locate the targets for each level blocked. Once located, the skin was marked with an approved surgical skin marker. Once all sites were marked, the skin (epidermis, dermis, and hypodermis), as well as deeper tissues (fat, connective tissue and muscle) were infiltrated with a small amount of a short-acting local anesthetic, loaded on a 10cc syringe with a 25G, 1.5-in  Needle. An appropriate amount of time was allowed for local anesthetics to take effect before proceeding to the next step. Local Anesthetic: Lidocaine 2.0% The unused portion of the local anesthetic was discarded in the proper designated containers. Technical explanation of process:  L2 Medial Branch Nerve Block (MBB): The target area for the L2 medial branch is at the junction of the postero-lateral aspect of the superior articular process and the superior, posterior, and medial edge of the transverse process of L3. Under fluoroscopic guidance, a Quincke needle was inserted until contact was made with os over the superior postero-lateral aspect of the pedicular shadow (target area). After negative aspiration for blood, 0.5 mL of the nerve block solution was injected without difficulty or complication. The needle was removed intact. L3 Medial Branch Nerve Block (MBB): The target area for the L3 medial branch is at the junction of the postero-lateral aspect of the superior articular process and the superior, posterior, and  medial edge of the transverse process of L4. Under fluoroscopic guidance, a Quincke needle was inserted until contact was made with os over the superior postero-lateral aspect of the pedicular shadow (target area). After negative aspiration for blood, 0.5 mL of the nerve block solution was injected without difficulty or complication. The needle was removed intact. L4 Medial Branch Nerve Block (MBB): The target area for the L4 medial branch is at the junction of the postero-lateral aspect of the superior articular process and the superior, posterior, and medial edge of the transverse process of L5. Under fluoroscopic guidance, a Quincke needle was inserted until contact was made with os over the superior postero-lateral aspect of the pedicular shadow (target area). After negative aspiration for blood, 0.5 mL of the nerve block solution was injected without difficulty or complication. The needle was removed intact. L5 Medial Branch Nerve Block (MBB): The target area for the L5 medial branch is at the junction of the postero-lateral aspect of the superior articular process and the superior, posterior, and medial edge of the sacral ala. Under fluoroscopic guidance, a Quincke needle was inserted until contact was made with os over the superior postero-lateral aspect of the pedicular shadow (target area). After negative aspiration for blood, 0.5 mL of the nerve block solution was injected without difficulty or complication. The needle was removed intact. S1 Medial Branch Nerve Block (MBB): The target area for the S1 medial branch is at the posterior and inferior 6 o'clock position of the L5-S1 facet joint. Under fluoroscopic guidance, the Quincke needle inserted for the L5 MBB was redirected until contact was made with os over the inferior and postero aspect of the sacrum, at the 6 o' clock position under the L5-S1 facet joint (Target area). After negative aspiration for blood, 0.5 mL of the nerve block solution was  injected without difficulty or complication. The needle was removed intact.  Nerve block solution: 0.2% PF-Ropivacaine + Triamcinolone (40 mg/mL) diluted to a final concentration of 4  mg of Triamcinolone/mL of Ropivacaine The unused portion of the solution was discarded in the proper designated containers. Procedural Needles: 22-gauge, 3.5-inch, Quincke needles used for all levels.  Once the entire procedure was completed, the treated area was cleaned, making sure to leave some of the prepping solution back to take advantage of its long term bactericidal properties.   Illustration of the posterior view of the lumbar spine and the posterior neural structures. Laminae of L2 through S1 are labeled. DPRL5, dorsal primary ramus of L5; DPRS1, dorsal primary ramus of S1; DPR3, dorsal primary ramus of L3; FJ, facet (zygapophyseal) joint L3-L4; I, inferior articular process of L4; LB1, lateral branch of dorsal primary ramus of L1; IAB, inferior articular branches from L3 medial branch (supplies L4-L5 facet joint); IBP, intermediate branch plexus; MB3, medial branch of dorsal primary ramus of L3; NR3, third lumbar nerve root; S, superior articular process of L5; SAB, superior articular branches from L4 (supplies L4-5 facet joint also); TP3, transverse process of L3.  Vitals:   03/08/20 1054 03/08/20 1140 03/08/20 1145 03/08/20 1150  BP: 134/63 (!) 161/82 (!) 155/83 (!) 165/76  Pulse: 92     Resp: 16 17 16 18   Temp: (!) 97 F (36.1 C)     TempSrc: Temporal     SpO2: 98% 99% 99% 99%  Weight: 93 lb (42.2 kg)     Height: 4\' 11"  (1.499 m)        Start Time: 1139 hrs. End Time: 1149 hrs.  Imaging Guidance (Spinal):          Type of Imaging Technique: Fluoroscopy Guidance (Spinal) Indication(s): Assistance in needle guidance and placement for procedures requiring needle placement in or near specific anatomical locations not easily accessible without such assistance. Exposure Time: Please see nurses  notes. Contrast: None used. Fluoroscopic Guidance: I was personally present during the use of fluoroscopy. "Tunnel Vision Technique" used to obtain the best possible view of the target area. Parallax error corrected before commencing the procedure. "Direction-depth-direction" technique used to introduce the needle under continuous pulsed fluoroscopy. Once target was reached, antero-posterior, oblique, and lateral fluoroscopic projection used confirm needle placement in all planes. Images permanently stored in EMR. Interpretation: No contrast injected. I personally interpreted the imaging intraoperatively. Adequate needle placement confirmed in multiple planes. Permanent images saved into the patient's record.  Antibiotic Prophylaxis:   Anti-infectives (From admission, onward)   None     Indication(s): None identified  Post-operative Assessment:  Post-procedure Vital Signs:  Pulse/HCG Rate: 92(!) 102 Temp: (!) 97 F (36.1 C) Resp: 18 BP: (!) 165/76 SpO2: 99 %  EBL: None  Complications: No immediate post-treatment complications observed by team, or reported by patient.  Note: The patient tolerated the entire procedure well. A repeat set of vitals were taken after the procedure and the patient was kept under observation following institutional policy, for this type of procedure. Post-procedural neurological assessment was performed, showing return to baseline, prior to discharge. The patient was provided with post-procedure discharge instructions, including a section on how to identify potential problems. Should any problems arise concerning this procedure, the patient was given instructions to immediately contact us, at any time, without hesitation. In any case, we plan to contact the patient by telephone for a follow-up status report regarding this interventional procedure.  Comments:  No additional relevant information.  Plan of Care  Orders:  Orders Placed This Encounter  Procedures   . LUMBAR FACET(MEDIAL BRANCH NERVE BLOCK) MBNB    Scheduling Instructions:  Procedure: Lumbar facet block (AKA.: Lumbosacral medial branch nerve block)     Side: Bilateral     Level: L3-4, L4-5, & L5-S1 Facets (L2, L3, L4, L5, & S1 Medial Branch Nerves)     Sedation: Patient's choice.     Timeframe: Today    Order Specific Question:   Where will this procedure be performed?    Answer:   [facility]  . [facility] C-ARM 1-60 MIN NO REPORT    Intraoperative interpretation by procedural physician at [place].    Standing Status:   Standing    Number of Occurrences:   1    Order Specific Question:   Reason for exam:    Answer:   Assistance in needle guidance and placement for procedures requiring needle placement in or near specific anatomical locations not easily accessible without such assistance.  . Informed Consent Details: Physician/Practitioner Attestation; Transcribe to consent form and obtain patient signature    Nursing Order: Transcribe to consent form and obtain patient signature. Note: Always confirm laterality of pain with Ms. Babula, before procedure.    Order Specific Question:   Physician/Practitioner attestation of informed consent for procedure/surgical case    Answer:   I, the physician/practitioner, attest that I have discussed with the patient the benefits, risks, side effects, alternatives, likelihood of achieving goals and potential problems during recovery for the procedure that I have provided informed consent.    Order Specific Question:   Procedure    Answer:   Lumbar Facet Block  under fluoroscopic guidance    Order Specific Question:   Physician/Practitioner performing the procedure    Answer:   Haivyn Oravec A. Dossie Arbour MD    Order Specific Question:   Indication/Reason    Answer:   Low Back Pain, with our without leg pain, due to Facet Joint Arthralgia (Joint Pain) Spondylosis (Arthritis of the Spine), without myelopathy or radiculopathy  (Nerve Damage).  . Care order/instruction: Please confirm that the patient has stopped the Coumadin (Warfarin) X 5 days prior to procedure or surgery.    Please confirm that the patient has stopped the Coumadin (Warfarin) X 5 days prior to procedure or surgery.    Standing Status:   Standing    Number of Occurrences:   1  . Provide equipment / supplies at bedside    "Block Tray" (Disposable  single use) Needle type: SpinalSpinal Amount/quantity: 4 Size: Regular (3.5-inch) Gauge: 22G    Standing Status:   Standing    Number of Occurrences:   1    Order Specific Question:   Specify    Answer:   Block Tray  . Bleeding precautions    Standing Status:   Standing    Number of Occurrences:   1   Chronic Opioid Analgesic:  No opioid analgesics prescribed by our practice.  MME/day: 0 mg/day   Medications ordered for procedure: Meds ordered this encounter  Medications  . lidocaine (XYLOCAINE) 2 % (with pres) injection 400 mg  . DISCONTD: lactated ringers infusion 1,000 mL  . DISCONTD: midazolam (VERSED) 5 MG/5ML injection 1-2 mg    Make sure Flumazenil is available in the pyxis when using this medication. If oversedation occurs, administer 0.2 mg IV over 15 sec. If after 45 sec no response, administer 0.2 mg again over 1 min; may repeat at 1 min intervals; not to exceed 4 doses (1 mg)  . DISCONTD: fentaNYL (SUBLIMAZE) injection 25-50 mcg    Make sure Narcan is available in  the pyxis when using this medication. In the event of respiratory depression (RR< 8/min): Titrate NARCAN (naloxone) in increments of 0.1 to 0.2 mg IV at 2-3 minute intervals, until desired degree of reversal.  . ropivacaine (PF) 2 mg/mL (0.2%) (NAROPIN) injection 18 mL  . triamcinolone acetonide (KENALOG-40) injection 80 mg   Medications administered: We administered lidocaine, ropivacaine (PF) 2 mg/mL (0.2%), and triamcinolone acetonide.  See the medical record for exact dosing, route, and time of  administration.  Follow-up plan:   Return in about 2 weeks (around 03/22/2020) for (F2F), (PP) Follow-up.       Interventional Therapies  Risk  Complexity Considerations:   NOTE: Coumadin anticoagulation (Stop: 5 days  Restart: 2 hrs)   Planned  Pending:   Diagnostic bilateral lumbar facet block #1    Under consideration:   Diagnostic/therapeutic Lumbar/thoracic epidural steroid injections.   Completed:   None at this time   Therapeutic  Palliative (PRN) options:   None established     Recent Visits Date Type Provider Dept  03/02/20 Office Visit Milinda Pointer, MD [facility]  02/03/20 Office Visit Milinda Pointer, MD [facility]  Showing recent visits within past 90 days and meeting all other requirements Today's Visits Date Type Provider Dept  03/08/20 Procedure visit Milinda Pointer, MD [facility]  Showing today's visits and meeting all other requirements Future Appointments Date Type Provider Dept  03/22/20 Appointment Milinda Pointer, MD [facility]  Showing future appointments within next 90 days and meeting all other requirements  Disposition: Discharge home  Discharge (Date  Time): 03/08/2020; 1200 hrs.   Primary Care Physician: Baxter Hire, MD Location: [facility] Note by: Gaspar Cola, MD Date: 03/08/2020; Time: 12:29 PM  Disclaimer:  Medicine is not an Chief Strategy Officer. The only guarantee in medicine is that nothing is guaranteed. It is important to note that the decision to proceed with this intervention was based on the information collected from the patient. The Data and conclusions were drawn from the patient's questionnaire, the interview, and the physical examination. Because the information was provided in large part by the patient, it cannot be guaranteed that it has not been purposely or unconsciously manipulated. Every effort has been made to obtain as much  relevant data as possible for this evaluation. It is important to note that the conclusions that lead to this procedure are derived in large part from the available data. Always take into account that the treatment will also be dependent on availability of resources and existing treatment guidelines, considered by other Pain Management Practitioners as being common knowledge and practice, at the time of the intervention. For Medico-Legal purposes, it is also important to point out that variation in procedural techniques and pharmacological choices are the acceptable norm. The indications, contraindications, technique, and results of the above procedure should only be interpreted and judged by a Board-Certified Interventional Pain Specialist with extensive familiarity and expertise in the same exact procedure and technique.

## 2020-03-08 NOTE — Progress Notes (Signed)
Safety precautions to be maintained throughout the outpatient stay will include: orient to surroundings, keep bed in low position, maintain call bell within reach at all times, provide assistance with transfer out of bed and ambulation.  

## 2020-03-08 NOTE — Patient Instructions (Addendum)
____________________________________________________________________________________________  Post-Procedure Discharge Instructions  Instructions:  Apply ice:   Purpose: This will minimize any swelling and discomfort after procedure.   When: Day of procedure, as soon as you get home.  How: Fill a plastic sandwich bag with crushed ice. Cover it with a small towel and apply to injection site.  How long: (15 min on, 15 min off) Apply for 15 minutes then remove x 15 minutes.  Repeat sequence on day of procedure, until you go to bed.  Apply heat:   Purpose: To treat any soreness and discomfort from the procedure.  When: Starting the next day after the procedure.  How: Apply heat to procedure site starting the day following the procedure.  How long: May continue to repeat daily, until discomfort goes away.  Food intake: Start with clear liquids (like water) and advance to regular food, as tolerated.   Physical activities: Keep activities to a minimum for the first 8 hours after the procedure. After that, then as tolerated.  Driving: If you have received any sedation, be responsible and do not drive. You are not allowed to drive for 24 hours after having sedation.  Blood thinner: (Applies only to those taking blood thinners) You may restart your blood thinner 6 hours after your procedure.  Insulin: (Applies only to Diabetic patients taking insulin) As soon as you can eat, you may resume your normal dosing schedule.  Infection prevention: Keep procedure site clean and dry. Shower daily and clean area with soap and water.  Post-procedure Pain Diary: Extremely important that this be done correctly and accurately. Recorded information will be used to determine the next step in treatment. For the purpose of accuracy, follow these rules:  Evaluate only the area treated. Do not report or include pain from an untreated area. For the purpose of this evaluation, ignore all other areas of pain,  except for the treated area.  After your procedure, avoid taking a long nap and attempting to complete the pain diary after you wake up. Instead, set your alarm clock to go off every hour, on the hour, for the initial 8 hours after the procedure. Document the duration of the numbing medicine, and the relief you are getting from it.  Do not go to sleep and attempt to complete it later. It will not be accurate. If you received sedation, it is likely that you were given a medication that may cause amnesia. Because of this, completing the diary at a later time may cause the information to be inaccurate. This information is needed to plan your care.  Follow-up appointment: Keep your post-procedure follow-up evaluation appointment after the procedure (usually 2 weeks for most procedures, 6 weeks for radiofrequencies). DO NOT FORGET to bring you pain diary with you.   Expect: (What should I expect to see with my procedure?)  From numbing medicine (AKA: Local Anesthetics): Numbness or decrease in pain. You may also experience some weakness, which if present, could last for the duration of the local anesthetic.  Onset: Full effect within 15 minutes of injected.  Duration: It will depend on the type of local anesthetic used. On the average, 1 to 8 hours.   From steroids (Applies only if steroids were used): Decrease in swelling or inflammation. Once inflammation is improved, relief of the pain will follow.  Onset of benefits: Depends on the amount of swelling present. The more swelling, the longer it will take for the benefits to be seen. In some cases, up to 10 days.    Duration: Steroids will stay in the system x 2 weeks. Duration of benefits will depend on multiple posibilities including persistent irritating factors.  Side-effects: If present, they may typically last 2 weeks (the duration of the steroids).  Frequent: Cramps (if they occur, drink Gatorade and take over-the-counter Magnesium 450-500 mg  once to twice a day); water retention with temporary weight gain; increases in blood sugar; decreased immune system response; increased appetite.  Occasional: Facial flushing (red, warm cheeks); mood swings; menstrual changes.  Uncommon: Long-term decrease or suppression of natural hormones; bone thinning. (These are more common with higher doses or more frequent use. This is why we prefer that our patients avoid having any injection therapies in other practices.)   Very Rare: Severe mood changes; psychosis; aseptic necrosis.  From procedure: Some discomfort is to be expected once the numbing medicine wears off. This should be minimal if ice and heat are applied as instructed.  Call if: (When should I call?)  You experience numbness and weakness that gets worse with time, as opposed to wearing off.  New onset bowel or bladder incontinence. (Applies only to procedures done in the spine)  Emergency Numbers:  Durning business hours (Monday - Thursday, 8:00 AM - 4:00 PM) (Friday, 9:00 AM - 12:00 Noon): [phone]  After hours: [phone]  NOTE: If you are having a problem and are unable connect with, or to talk to a provider, then go to your nearest urgent care or emergency department. If the problem is serious and urgent, please call 911. ____________________________________________________________________________________________   Pain Management Discharge Instructions  General Discharge Instructions :  If you need to reach your doctor call: Monday-Friday 8:00 am - 4:00 pm at [phone] or toll free [phone].  After clinic hours [phone] to have operator reach doctor.  Bring all of your medication bottles to all your appointments in the pain clinic.  To cancel or reschedule your appointment with Pain Management please remember to call 24 hours in advance to avoid a fee.  Refer to the educational materials which you have been given on: General Risks, I had my  Procedure. Discharge Instructions, Post Sedation.  Post Procedure Instructions:  The drugs you were given will stay in your system until tomorrow, so for the next 24 hours you should not drive, make any legal decisions or drink any alcoholic beverages.  You may eat anything you prefer, but it is better to start with liquids then soups and crackers, and gradually work up to solid foods.  Please notify your doctor immediately if you have any unusual bleeding, trouble breathing or pain that is not related to your normal pain.  Depending on the type of procedure that was done, some parts of your body may feel week and/or numb.  This usually clears up by tonight or the next day.  Walk with the use of an assistive device or accompanied by an adult for the 24 hours.  You may use ice on the affected area for the first 24 hours.  Put ice in a Ziploc bag and cover with a towel and place against area 15 minutes on 15 minutes off.  You may switch to heat after 24 hours.Facet Blocks Patient Information  Description: The facets are joints in the spine between the vertebrae.  Like any joints in the body, facets can become irritated and painful.  Arthritis can also effect the facets.  By injecting steroids and local anesthetic in and around these joints, we can temporarily block the nerve   supply to them.  Steroids act directly on irritated nerves and tissues to reduce selling and inflammation which often leads to decreased pain.  Facet blocks may be done anywhere along the spine from the neck to the low back depending upon the location of your pain.   After numbing the skin with local anesthetic (like Novocaine), a small needle is passed onto the facet joints under x-ray guidance.  You may experience a sensation of pressure while this is being done.  The entire block usually lasts about 15-25 minutes.   Conditions which may be treated by facet blocks:   Low back/buttock pain  Neck/shoulder pain  Certain  types of headaches  Preparation for the injection:  1. Do not eat any solid food or dairy products within 8 hours of your appointment. 2. You may drink clear liquid up to 3 hours before appointment.  Clear liquids include water, black coffee, juice or soda.  No milk or cream please. 3. You may take your regular medication, including pain medications, with a sip of water before your appointment.  Diabetics should hold regular insulin (if taken separately) and take 1/2 normal NPH dose the morning of the procedure.  Carry some sugar containing items with you to your appointment. 4. A driver must accompany you and be prepared to drive you home after your procedure. 5. Bring all your current medications with you. 6. An IV may be inserted and sedation may be given at the discretion of the physician. 7. A blood pressure cuff, EKG and other monitors will often be applied during the procedure.  Some patients may need to have extra oxygen administered for a short period. 8. You will be asked to provide medical information, including your allergies and medications, prior to the procedure.  We must know immediately if you are taking blood thinners (like Coumadin/Warfarin) or if you are allergic to IV iodine contrast (dye).  We must know if you could possible be pregnant.  Possible side-effects:   Bleeding from needle site  Infection (rare, may require surgery)  Nerve injury (rare)  Numbness & tingling (temporary)  Difficulty urinating (rare, temporary)  Spinal headache (a headache worse with upright posture)  Light-headedness (temporary)  Pain at injection site (serveral days)  Decreased blood pressure (rare, temporary)  Weakness in arm/leg (temporary)  Pressure sensation in back/neck (temporary)   Call if you experience:   Fever/chills associated with headache or increased back/neck pain  Headache worsened by an upright position  New onset, weakness or numbness of an extremity  below the injection site  Hives or difficulty breathing (go to the emergency room)  Inflammation or drainage at the injection site(s)  Severe back/neck pain greater than usual  New symptoms which are concerning to you  Please note:  Although the local anesthetic injected can often make your back or neck feel good for several hours after the injection, the pain will likely return. It takes 3-7 days for steroids to work.  You may not notice any pain relief for at least one week.  If effective, we will often do a series of 2-3 injections spaced 3-6 weeks apart to maximally decrease your pain.  After the initial series, you may be a candidate for a more permanent nerve block of the facets.  If you have any questions, please call [phone] [facility] 

## 2020-03-09 ENCOUNTER — Telehealth: Payer: Self-pay

## 2020-03-09 NOTE — Telephone Encounter (Signed)
Post procedure follow up phone call.  Patient states she is doing very well.

## 2020-03-19 NOTE — Progress Notes (Signed)
PROVIDER NOTE: Information contained herein reflects review and annotations entered in association with encounter. Interpretation of such information and data should be left to medically-trained personnel. Information provided to patient can be located elsewhere in the medical record under "Patient Instructions". Document created using STT-dictation technology, any transcriptional errors that may result from process are unintentional.    Patient: Elizabeth Fields  Service Category: E/M  Provider: Gaspar Cola, MD  DOB: 04/16/1931  DOS: 03/22/2020  Specialty: Interventional Pain Management  MRN: [id number]  Setting: Ambulatory outpatient  PCP: Baxter Hire, MD  Type: Established Patient    Referring Provider: Baxter Hire, MD  Location: Office  Delivery: Face-to-face     HPI  [name], a 87 y.o. year old female, is here today because of her Chronic pain syndrome [G89.4]. Elizabeth Fields primary complain today is Back Pain (upper) Last encounter: My last encounter with her was on 03/08/2020. Pertinent problems: Elizabeth Fields has Chronic sacroiliac joint pain (Left); Lumbar facet syndrome (Bilateral); Chronic low back pain (1ry area of Pain) (Bilateral) w/o sciatica; Lumbar spondylosis; Chronic pain syndrome; Hip fracture, sequela (Left); Multiple chronic diseases; Compression fracture of L4 vertebra, sequela; Compression fracture of L1 vertebra, sequela ; Compression fracture of T12 vertebra, sequela; Abnormal MRI, lumbar spine (11/26/2018); DDD (degenerative disc disease), lumbosacral; DDD (degenerative disc disease), thoracolumbar; Osteoarthritis of sacroiliac joints (HCC) (Bilateral); Chronic sacroiliac joint pain (Bilateral); Chronic midline thoracic back pain; DDD (degenerative disc disease), cervical; Spondylosis without myelopathy or radiculopathy, lumbosacral region; Osteoarthritis of facet joint of lumbar spine; and Thoracic facet syndrome (Multilevel) (Bilateral) on their pertinent problem  list. Pain Assessment: Severity of Chronic pain is reported as a 8 /10. Location: Back Upper/denies. Onset: More than a month ago. Quality: Aching. Timing: Intermittent. Modifying factor(s): sitting or lying down. Vitals:  height is _0  (1.499 m) and weight is 92 lb (41.7 kg). Her temporal temperature is 97.2 F (36.2 C) (abnormal). Her blood pressure is 127/70 and her pulse is 90. Her respiration is 16 and oxygen saturation is 96%.   Reason for encounter: post-procedure assessment.  The patient indicates that the bilateral lumbar facet block from L2-S1 did provide her with 80% of ongoing relief of her low back pain.  The remainder of the pain is actually in the upper part above where we did block.  The patient wants [place] to do something for that area.  In looking at the area and asking the patient to tell me both the upper and lower limits, it would seem that she is having pain in the region of the thoracic facets.  Since she has degenerative disc disease and degenerative facet joint disease along the entire length of the spine, it is understandable that this can be also causing some symptoms in the thoracic region.  We will go ahead and schedule her to come back for a diagnostic bilateral thoracic facet block.  Post-Procedure Evaluation  Procedure (03/08/2020): Diagnostic bilateral lumbar facet block #1 under fluoroscopic guidance, no sedation Pre-procedure pain level: 5/10 Post-procedure: 0/10 (100% relief)  Sedation: None.  Effectiveness during initial hour after procedure(Ultra-Short Term Relief): 100 %.  Local anesthetic used: Long-acting (4-6 hours) Effectiveness: Defined as any analgesic benefit obtained secondary to the administration of local anesthetics. This carries significant diagnostic value as to the etiological location, or anatomical origin, of the pain. Duration of benefit is expected to coincide with the duration of the local anesthetic used.  Effectiveness during initial 4-6 hours  after procedure(Short-Term Relief):  100 %.  Long-term benefit: Defined as any relief past the pharmacologic duration of the local anesthetics.  Effectiveness past the initial 6 hours after procedure(Long-Term Relief): 80 %.  Current benefits: Defined as benefit that persist at this time.   Analgesia:  The patient describes currently enjoying an ongoing 80% improvement of her low back pain.  However, she still has pain on both sides of her thoracolumbar area, just above where we did the facet blocks. Function: Elizabeth Fields reports improvement in function ROM: Elizabeth Fields reports improvement in ROM  Pharmacotherapy Assessment   Analgesic: No opioid analgesics prescribed by our practice.  MME/day: 0 mg/day   Monitoring: [place] PMP: PDMP reviewed during this encounter.       Pharmacotherapy: No side-effects or adverse reactions reported. Compliance: No problems identified. Effectiveness: Clinically acceptable.  Landis Martins, RN  03/22/2020  1:57 PM  Sign when Signing Visit Safety precautions to be maintained throughout the outpatient stay will include: orient to surroundings, keep bed in low position, maintain call bell within reach at all times, provide assistance with transfer out of bed and ambulation.     UDS:  Summary  Date Value Ref Range Status  02/03/2020 Note  Final    Comment:    ==================================================================== Compliance Drug Analysis, Ur ==================================================================== Specimen Alert Note: Urinary creatinine is low; ability to detect some drugs may be compromised. Interpret results with caution. (Creatinine) ==================================================================== Test                             Result       Flag       Units  Drug Present and Declared for Prescription Verification   Acetaminophen                  PRESENT      EXPECTED   Metoprolol                     PRESENT       EXPECTED  Drug Absent but Declared for Prescription Verification   Hydrocodone                    Not Detected UNEXPECTED ng/mg creat   Doxepin                        Not Detected UNEXPECTED   Salicylate                     Not Detected UNEXPECTED    Aspirin, as indicated in the declared medication list, is not always    detected even when used as directed.  ==================================================================== Test                      Result    Flag   Units      Ref Range   Creatinine              17        LL     mg/dL      >=20 ==================================================================== Declared Medications:  The flagging and interpretation on this report are based on the  following declared medications.  Unexpected results may arise from  inaccuracies in the declared medications.   **Note: The testing scope of this panel includes these medications:   Doxepin  Hydrocodone  Metoprolol   **Note: The testing scope of this panel  does not include small to  moderate amounts of these reported medications:   Acetaminophen  Aspirin   **Note: The testing scope of this panel does not include the  following reported medications:   Cholecalciferol  Esomeprazole  Furosemide  Levothyroxine  Magnesium (Mag-Ox)  Multivitamin  Simvastatin  Supplement  Warfarin ==================================================================== For clinical consultation, please call [phone]. ====================================================================      ROS  Constitutional: Denies any fever or chills Gastrointestinal: No reported hemesis, hematochezia, vomiting, or acute GI distress Musculoskeletal: Denies any acute onset joint swelling, redness, loss of ROM, or weakness Neurological: No reported episodes of acute onset apraxia, aphasia, dysarthria, agnosia, amnesia, paralysis, loss of coordination, or loss of consciousness  Medication Review   Cholecalciferol, acetaminophen, aspirin EC, doxepin, esomeprazole, feeding supplement, ferrous [id number] C-folic acid, furosemide, levothyroxine, magnesium oxide, metoprolol succinate, simvastatin, traMADol, and warfarin  History Review  Allergy: Elizabeth Fields is allergic to fosamax [alendronate sodium], other, and penicillins. Drug: Elizabeth Fields  reports no history of drug use. Alcohol:  reports no history of alcohol use. Tobacco:  reports that she has never smoked. She has never used smokeless tobacco. Social: Elizabeth Fields  reports that she has never smoked. She has never used smokeless tobacco. She reports that she does not drink alcohol and does not use drugs. Medical:  has a past medical history of Actinic keratosis, Actinic keratosis (11/02/2019), Allergy, Atrial fibrillation ([place]), CHF (congestive heart failure) ([place]), History of squamous cell carcinoma, Hyperlipidemia, Hypertension, Renal insufficiency, Squamous cell carcinoma of skin (02/25/2017), and Thyroid disease. Surgical: Elizabeth Fields  has a past surgical history that includes Cholecystectomy; Hip surgery; and Anterior approach hemi hip arthroplasty (Left, 02/12/2019). Family: family history includes Breast cancer (age of onset: 9) in her mother; Heart disease in her father.  Laboratory Chemistry Profile   Renal Lab Results  Component Value Date   BUN 23 02/03/2020   CREATININE 1.36 (H) 02/03/2020   BCR 17 02/03/2020   GFRAA 41 (L) 02/03/2020   GFRNONAA 35 (L) 02/03/2020     Hepatic Lab Results  Component Value Date   AST 25 02/03/2020   ALBUMIN 4.9 (H) 02/03/2020   ALKPHOS 49 02/03/2020     Electrolytes Lab Results  Component Value Date   NA 140 02/03/2020   K 4.5 02/03/2020   CL 101 02/03/2020   CALCIUM 9.5 02/03/2020   MG 2.3 02/03/2020     Bone Lab Results  Component Value Date   25OHVITD1 53 02/03/2020   25OHVITD2 <1.0 02/03/2020   25OHVITD3 53 02/03/2020     Inflammation (CRP: Acute Phase) (ESR: Chronic  Phase) Lab Results  Component Value Date   CRP <1 02/03/2020   ESRSEDRATE 34 02/03/2020   LATICACIDVEN 1.5 02/15/2019       Note: Above Lab results reviewed.  Recent Imaging Review  DG PAIN CLINIC C-ARM 1-60 MIN NO REPORT Fluoro was used, but no Radiologist interpretation will be provided.  Please refer to "NOTES" tab for provider progress note. Note: Reviewed        Physical Exam  General appearance: Well nourished, well developed, and well hydrated. In no apparent acute distress Mental status: Alert, oriented x 3 (person, place, & time)       Respiratory: No evidence of acute respiratory distress Eyes: PERLA Vitals: BP 127/70   Pulse 90   Temp (!) 97.2 F (36.2 C) (Temporal)   Resp 16   Ht _0  (1.499 m)   Wt 92 lb (41.7 kg)   SpO2 96%  BMI 18.58 kg/m  BMI: Estimated body mass index is 18.58 kg/m as calculated from the following:   Height as of this encounter: _0  (1.499 m).   Weight as of this encounter: 92 lb (41.7 kg). Ideal: Female patients must weigh at least 45.5 kg to calculate ideal body weight  Assessment   Status Diagnosis  Controlled Controlled Controlled 1. Chronic pain syndrome   2. Lumbar facet syndrome (Bilateral)   3. Chronic low back pain (1ry area of Pain) (Bilateral) w/o sciatica   4. Chronic sacroiliac joint pain (Bilateral)   5. Compression fracture of L1 vertebra, sequela    6. Compression fracture of L4 vertebra, sequela   7. Compression fracture of T12 vertebra, sequela   8. Thoracic facet syndrome (Multilevel) (Bilateral)   9. DDD (degenerative disc disease), thoracolumbar   10. Chronic anticoagulation (Coumadin)      Updated Problems: Problem  Thoracic facet syndrome (Multilevel) (Bilateral)    Plan of Care  Problem-specific:  No problem-specific Assessment & Plan notes found for this encounter.  Ms. JUNNIE LOSCHIAVO has a current medication list which includes the following long-term medication(s): doxepin, esomeprazole,  furosemide, levothyroxine, metoprolol succinate, simvastatin, and warfarin.  Pharmacotherapy (Medications Ordered): No orders of the defined types were placed in this encounter.  Orders:  Orders Placed This Encounter  Procedures  . THORACIC FACET BLOCK    Standing Status:   Future    Standing Expiration Date:   04/21/2020    Scheduling Instructions:     Thoracic Medial Branch Block     Side: Bilateral     Sedation: Patient's choice.     Timeframe: ASAA    Order Specific Question:   Where will this procedure be performed?    Answer:   [facility]  . Blood Thinner Instructions to Nursing    Always make sure patient has clearance from prescribing physician to stop blood thinners for interventional therapies. If the patient requires a Lovenox-bridge therapy, make sure arrangements are made to institute it with the assistance of the PCP.    Scheduling Instructions:     Have Elizabeth Fields stop the Coumadin (Warfarin) X 5 days prior to procedure or surgery.   Follow-up plan:   Return for Procedure (w/ sedation): (B) T-FCT BLK #1, (Blood Thinner Protocol).      Interventional Therapies  Risk  Complexity Considerations:   NOTE: Coumadin anticoagulation (Stop: 5 days  Restart: 2 hrs)   Planned  Pending:   Diagnostic bilateral lumbar facet block #2    Under consideration:   Diagnostic/therapeutic Lumbar/thoracic epidural steroid injections.   Completed:   Diagnostic bilateral lumbar facet block x1    Therapeutic  Palliative (PRN) options:   Diagnostic bilateral lumbar facet block #2       Recent Visits Date Type Provider Dept  03/08/20 Procedure visit Milinda Pointer, MD [facility]  03/02/20 Office Visit Milinda Pointer, MD [facility]  02/03/20 Office Visit Milinda Pointer, MD [facility]  Showing recent visits within past 90 days and meeting all other requirements Today's Visits Date Type Provider Dept  03/22/20 Office Visit  Milinda Pointer, MD [facility]  Showing today's visits and meeting all other requirements Future Appointments No visits were found meeting these conditions. Showing future appointments within next 90 days and meeting all other requirements  I discussed the assessment and treatment plan with the patient. The patient was provided an opportunity to ask questions and all were answered. The patient agreed  with the plan and demonstrated an understanding of the instructions.  Patient advised to call back or seek an in-person evaluation if the symptoms or condition worsens.  Duration of encounter: 30 minutes.  Note by: Gaspar Cola, MD Date: 03/22/2020; Time: 2:22 PM

## 2020-03-22 ENCOUNTER — Other Ambulatory Visit: Payer: Self-pay

## 2020-03-22 ENCOUNTER — Encounter: Payer: Self-pay | Admitting: Pain Medicine

## 2020-03-22 ENCOUNTER — Ambulatory Visit: Payer: PPO | Attending: Pain Medicine | Admitting: Pain Medicine

## 2020-03-22 VITALS — BP 127/70 | HR 90 | Temp 97.2°F | Resp 16 | Ht 59.0 in | Wt 92.0 lb

## 2020-03-22 DIAGNOSIS — M47816 Spondylosis without myelopathy or radiculopathy, lumbar region: Secondary | ICD-10-CM | POA: Diagnosis not present

## 2020-03-22 DIAGNOSIS — S22080S Wedge compression fracture of T11-T12 vertebra, sequela: Secondary | ICD-10-CM | POA: Insufficient documentation

## 2020-03-22 DIAGNOSIS — S32040S Wedge compression fracture of fourth lumbar vertebra, sequela: Secondary | ICD-10-CM | POA: Insufficient documentation

## 2020-03-22 DIAGNOSIS — M533 Sacrococcygeal disorders, not elsewhere classified: Secondary | ICD-10-CM | POA: Insufficient documentation

## 2020-03-22 DIAGNOSIS — G894 Chronic pain syndrome: Secondary | ICD-10-CM | POA: Diagnosis not present

## 2020-03-22 DIAGNOSIS — G8929 Other chronic pain: Secondary | ICD-10-CM | POA: Insufficient documentation

## 2020-03-22 DIAGNOSIS — M47894 Other spondylosis, thoracic region: Secondary | ICD-10-CM | POA: Diagnosis not present

## 2020-03-22 DIAGNOSIS — Z7901 Long term (current) use of anticoagulants: Secondary | ICD-10-CM | POA: Diagnosis not present

## 2020-03-22 DIAGNOSIS — S32010S Wedge compression fracture of first lumbar vertebra, sequela: Secondary | ICD-10-CM | POA: Diagnosis not present

## 2020-03-22 DIAGNOSIS — M545 Low back pain, unspecified: Secondary | ICD-10-CM | POA: Diagnosis not present

## 2020-03-22 DIAGNOSIS — M5135 Other intervertebral disc degeneration, thoracolumbar region: Secondary | ICD-10-CM | POA: Insufficient documentation

## 2020-03-22 DIAGNOSIS — E034 Atrophy of thyroid (acquired): Secondary | ICD-10-CM | POA: Diagnosis not present

## 2020-03-22 NOTE — Patient Instructions (Addendum)
___Patient states she has already stopped blood thinner and has discussed with Dr Saralyn Pilar.  _________________________________________________________________________________________  Preparing for Procedure with Sedation  Procedure appointments are limited to planned procedures: . No Prescription Refills. . No disability issues will be discussed. . No medication changes will be discussed.  Instructions: . Oral Intake: Do not eat or drink anything for at least 8 hours prior to your procedure. (Exception: Blood Pressure Medication. See below.) . Transportation: Unless otherwise stated by your physician, you may drive yourself after the procedure. . Blood Pressure Medicine: Do not forget to take your blood pressure medicine with a sip of water the morning of the procedure. If your Diastolic (lower reading)is above 100 mmHg, elective cases will be cancelled/rescheduled. . Blood thinners: These will need to be stopped for procedures. Notify our staff if you are taking any blood thinners. Depending on which one you take, there will be specific instructions on how and when to stop it. . Diabetics on insulin: Notify the staff so that you can be scheduled 1st case in the morning. If your diabetes requires high dose insulin, take only  of your normal insulin dose the morning of the procedure and notify the staff that you have done so. . Preventing infections: Shower with an antibacterial soap the morning of your procedure. . Build-up your immune system: Take 1000 mg of Vitamin C with every meal (3 times a day) the day prior to your procedure. Marland Kitchen Antibiotics: Inform the staff if you have a condition or reason that requires you to take antibiotics before dental procedures. . Pregnancy: If you are pregnant, call and cancel the procedure. . Sickness: If you have a cold, fever, or any active infections, call and cancel the procedure. . Arrival: You must be in the facility at least 30 minutes prior to your  scheduled procedure. . Children: Do not bring children with you. . Dress appropriately: Bring dark clothing that you would not mind if they get stained. . Valuables: Do not bring any jewelry or valuables.  Reasons to call and reschedule or cancel your procedure: (Following these recommendations will minimize the risk of a serious complication.) . Surgeries: Avoid having procedures within 2 weeks of any surgery. (Avoid for 2 weeks before or after any surgery). . Flu Shots: Avoid having procedures within 2 weeks of a flu shots or . (Avoid for 2 weeks before or after immunizations). . Barium: Avoid having a procedure within 7-10 days after having had a radiological study involving the use of radiological contrast. (Myelograms, Barium swallow or enema study). . Heart attacks: Avoid any elective procedures or surgeries for the initial 6 months after a "Myocardial Infarction" (Heart Attack). . Blood thinners: It is imperative that you stop these medications before procedures. Let us know if you if you take any blood thinner.  . Infection: Avoid procedures during or within two weeks of an infection (including chest colds or gastrointestinal problems). Symptoms associated with infections include: Localized redness, fever, chills, night sweats or profuse sweating, burning sensation when voiding, cough, congestion, stuffiness, runny nose, sore throat, diarrhea, nausea, vomiting, cold or Flu symptoms, recent or current infections. It is specially important if the infection is over the area that we intend to treat. Marland Kitchen Heart and lung problems: Symptoms that may suggest an active cardiopulmonary problem include: cough, chest pain, breathing difficulties or shortness of breath, dizziness, ankle swelling, uncontrolled high or unusually low blood pressure, and/or palpitations. If you are experiencing any of these symptoms, cancel your procedure and  contact your primary care physician for an evaluation.  Remember:   Regular Business hours are:  Monday to Thursday 8:00 AM to 4:00 PM  Provider's Schedule: Milinda Pointer, MD:  Procedure days: Tuesday and Thursday 7:30 AM to 4:00 PM  Gillis Santa, MD:  Procedure days: Monday and Wednesday 7:30 AM to 4:00 PM ____________________________________________________________________________________________   ____________________________________________________________________________________________  General Risks and Possible Complications  Patient Responsibilities: It is important that you read this as it is part of your informed consent. It is our duty to inform you of the risks and possible complications associated with treatments offered to you. It is your responsibility as a patient to read this and to ask questions about anything that is not clear or that you believe was not covered in this document.  Patient's Rights: You have the right to refuse treatment. You also have the right to change your mind, even after initially having agreed to have the treatment done. However, under this last option, if you wait until the last second to change your mind, you may be charged for the materials used up to that point.  Introduction: Medicine is not an Chief Strategy Officer. Everything in Medicine, including the lack of treatment(s), carries the potential for danger, harm, or loss (which is by definition: Risk). In Medicine, a complication is a secondary problem, condition, or disease that can aggravate an already existing one. All treatments carry the risk of possible complications. The fact that a side effects or complications occurs, does not imply that the treatment was conducted incorrectly. It must be clearly understood that these can happen even when everything is done following the highest safety standards.  No treatment: You can choose not to proceed with the proposed treatment alternative. The "PRO(s)" would include: avoiding the risk of complications associated  with the therapy. The "CON(s)" would include: not getting any of the treatment benefits. These benefits fall under one of three categories: diagnostic; therapeutic; and/or palliative. Diagnostic benefits include: getting information which can ultimately lead to improvement of the disease or symptom(s). Therapeutic benefits are those associated with the successful treatment of the disease. Finally, palliative benefits are those related to the decrease of the primary symptoms, without necessarily curing the condition (example: decreasing the pain from a flare-up of a chronic condition, such as incurable terminal cancer).  General Risks and Complications: These are associated to most interventional treatments. They can occur alone, or in combination. They fall under one of the following six (6) categories: no benefit or worsening of symptoms; bleeding; infection; nerve damage; allergic reactions; and/or death. 1. No benefits or worsening of symptoms: In Medicine there are no guarantees, only probabilities. No healthcare provider can ever guarantee that a medical treatment will work, they can only state the probability that it may. Furthermore, there is always the possibility that the condition may worsen, either directly, or indirectly, as a consequence of the treatment. 2. Bleeding: This is more common if the patient is taking a blood thinner, either prescription or over the counter (example: Goody Powders, Fish oil, Aspirin, Garlic, etc.), or if suffering a condition associated with impaired coagulation (example: Hemophilia, cirrhosis of the liver, low platelet counts, etc.). However, even if you do not have one on these, it can still happen. If you have any of these conditions, or take one of these drugs, make sure to notify your treating physician. 3. Infection: This is more common in patients with a compromised immune system, either due to disease (example: diabetes, cancer, human immunodeficiency virus  [  HIV], etc.), or due to medications or treatments (example: therapies used to treat cancer and rheumatological diseases). However, even if you do not have one on these, it can still happen. If you have any of these conditions, or take one of these drugs, make sure to notify your treating physician. 4. Nerve Damage: This is more common when the treatment is an invasive one, but it can also happen with the use of medications, such as those used in the treatment of cancer. The damage can occur to small secondary nerves, or to large primary ones, such as those in the spinal cord and brain. This damage may be temporary or permanent and it may lead to impairments that can range from temporary numbness to permanent paralysis and/or brain death. 5. Allergic Reactions: Any time a substance or material comes in contact with our body, there is the possibility of an allergic reaction. These can range from a mild skin rash (contact dermatitis) to a severe systemic reaction (anaphylactic reaction), which can result in death. 6. Death: In general, any medical intervention can result in death, most of the time due to an unforeseen complication. ____________________________________________________________________________________________  ____________________________________________________________________________________________  Blood Thinners  IMPORTANT NOTICE:  If you take any of these, make sure to notify the nursing staff.  Failure to do so may result in injury.  Recommended time intervals to stop and restart blood-thinners, before & after invasive procedures  Generic Name Brand Name Stop Time. Must be stopped at least this long before procedures. After procedures, wait at least this long before re-starting.  Abciximab Reopro 15 days 2 hrs  Alteplase Activase 10 days 10 days  Anagrelide Agrylin    Apixaban Eliquis 3 days 6 hrs  Cilostazol Pletal 3 days 5 hrs  Clopidogrel Plavix 7-10 days 2 hrs  Dabigatran  Pradaxa 5 days 6 hrs  Dalteparin Fragmin 24 hours 4 hrs  Dipyridamole Aggrenox 11days 2 hrs  Edoxaban Lixiana; Savaysa 3 days 2 hrs  Enoxaparin  Lovenox 24 hours 4 hrs  Eptifibatide Integrillin 8 hours 2 hrs  Fondaparinux  Arixtra 72 hours 12 hrs  Hydroxychloroquine Plaquenil 11 days   Prasugrel Effient 7-10 days 6 hrs  Reteplase Retavase 10 days 10 days  Rivaroxaban Xarelto 3 days 6 hrs  Ticagrelor Brilinta 5-7 days 6 hrs  Ticlopidine Ticlid 10-14 days 2 hrs  Tinzaparin Innohep 24 hours 4 hrs  Tirofiban Aggrastat 8 hours 2 hrs  Warfarin Coumadin 5 days 2 hrs   Other medications with blood-thinning effects  Product indications Generic (Brand) names Note  Cholesterol Lipitor Stop 4 days before procedure  Blood thinner (injectable) Heparin (LMW or LMWH Heparin) Stop 24 hours before procedure  Cancer Ibrutinib (Imbruvica) Stop 7 days before procedure  Malaria/Rheumatoid Hydroxychloroquine (Plaquenil) Stop 11 days before procedure  Thrombolytics  10 days before or after procedures   Over-the-counter (OTC) Products with blood-thinning effects  Product Common names Stop Time  Aspirin > 325 mg Goody Powders, Excedrin, etc. 11 days  Aspirin ? 81 mg  7 days  Fish oil  4 days  Garlic supplements  7 days  Ginkgo biloba  36 hours  Ginseng  24 hours  NSAIDs Ibuprofen, Naprosyn, etc. 3 days  Vitamin E  4 days   ____________________________________________________________________________________________  Preparing for your procedure (without sedation) Instructions: . Oral Intake: Do not eat or drink anything for at least 3 hours prior to your procedure. . Transportation: Unless otherwise stated by your physician, you may drive yourself after the procedure. . Blood Pressure  Medicine: Take your blood pressure medicine with a sip of water the morning of the procedure. . Insulin: Take only  of your normal insulin dose. . Preventing infections: Shower with an antibacterial soap the morning  of your procedure. . Build-up your immune system: Take 1000 mg of Vitamin C with every meal (3 times a day) the day prior to your procedure. . Pregnancy: If you are pregnant, call and cancel the procedure. . Sickness: If you have a cold, fever, or any active infections, call and cancel the procedure. . Arrival: You must be in the facility at least 30 minutes prior to your scheduled procedure. . Children: Do not bring any children with you. . Dress appropriately: Bring dark clothing that you would not mind if they get stained. . Valuables: Do not bring any jewelry or valuables. Procedure appointments are reserved for interventional treatments only. Marland Kitchen No Prescription Refills. . No medication changes will be discussed during procedure appointments. . No disability issues will be discussed. Marland Kitchen

## 2020-03-22 NOTE — Progress Notes (Signed)
Safety precautions to be maintained throughout the outpatient stay will include: orient to surroundings, keep bed in low position, maintain call bell within reach at all times, provide assistance with transfer out of bed and ambulation.  

## 2020-03-29 ENCOUNTER — Other Ambulatory Visit: Payer: Self-pay

## 2020-03-29 ENCOUNTER — Ambulatory Visit (HOSPITAL_BASED_OUTPATIENT_CLINIC_OR_DEPARTMENT_OTHER): Payer: PPO | Admitting: Pain Medicine

## 2020-03-29 ENCOUNTER — Ambulatory Visit
Admission: RE | Admit: 2020-03-29 | Discharge: 2020-03-29 | Disposition: A | Discharge status: Home / Self Care | Payer: PPO | Source: Ambulatory Visit | Attending: Pain Medicine | Admitting: Pain Medicine

## 2020-03-29 ENCOUNTER — Encounter: Payer: Self-pay | Admitting: Pain Medicine

## 2020-03-29 ENCOUNTER — Telehealth: Payer: Self-pay | Admitting: *Deleted

## 2020-03-29 VITALS — BP 154/69 | HR 101 | Temp 97.2°F | Resp 18 | Ht 59.0 in | Wt 92.0 lb

## 2020-03-29 DIAGNOSIS — M47894 Other spondylosis, thoracic region: Secondary | ICD-10-CM | POA: Insufficient documentation

## 2020-03-29 DIAGNOSIS — S32010S Wedge compression fracture of first lumbar vertebra, sequela: Secondary | ICD-10-CM | POA: Diagnosis not present

## 2020-03-29 DIAGNOSIS — M5135 Other intervertebral disc degeneration, thoracolumbar region: Secondary | ICD-10-CM | POA: Insufficient documentation

## 2020-03-29 DIAGNOSIS — M47816 Spondylosis without myelopathy or radiculopathy, lumbar region: Secondary | ICD-10-CM

## 2020-03-29 DIAGNOSIS — S22080S Wedge compression fracture of T11-T12 vertebra, sequela: Secondary | ICD-10-CM | POA: Insufficient documentation

## 2020-03-29 DIAGNOSIS — M47814 Spondylosis without myelopathy or radiculopathy, thoracic region: Secondary | ICD-10-CM | POA: Insufficient documentation

## 2020-03-29 MED ORDER — LIDOCAINE HCL 2 % IJ SOLN
20.0000 mL | Freq: Once | INTRAMUSCULAR | Status: AC
  Administered 2020-03-29: 400 mg
  Filled 2020-03-29: qty 40

## 2020-03-29 MED ORDER — ROPIVACAINE HCL 2 MG/ML IJ SOLN
18.0000 mL | Freq: Once | INTRAMUSCULAR | Status: AC
  Administered 2020-03-29: 18 mL via PERINEURAL
  Filled 2020-03-29: qty 20

## 2020-03-29 MED ORDER — DEXAMETHASONE SODIUM PHOSPHATE 10 MG/ML IJ SOLN
20.0000 mg | Freq: Once | INTRAMUSCULAR | Status: AC
  Administered 2020-03-29: 20 mg
  Filled 2020-03-29: qty 2

## 2020-03-29 MED ORDER — MIDAZOLAM HCL 5 MG/5ML IJ SOLN: 1.0000 mg | INTRAMUSCULAR | Status: DC | PRN

## 2020-03-29 MED ORDER — FENTANYL CITRATE (PF) 100 MCG/2ML IJ SOLN: 25.0000 ug | INTRAMUSCULAR | Status: DC | PRN

## 2020-03-29 MED ORDER — LACTATED RINGERS IV SOLN: 1000.0000 mL | Freq: Once | INTRAVENOUS | Status: DC

## 2020-03-29 NOTE — Progress Notes (Signed)
PROVIDER NOTE: Information contained herein reflects review and annotations entered in association with encounter. Interpretation of such information and data should be left to medically-trained personnel. Information provided to patient can be located elsewhere in the medical record under "Patient Instructions". Document created using STT-dictation technology, any transcriptional errors that may result from process are unintentional.    Patient: Elizabeth Fields  Service Category: Procedure  Provider: Gaspar Cola, MD  DOB: 04/04/1931  DOS: 03/29/2020  Location: [facility]  MRN: [id number]  Setting: Ambulatory - outpatient  Referring Provider: Baxter Hire, MD  Type: Established Patient  Specialty: Interventional Pain Management  PCP: Baxter Hire, MD   Primary Reason for Visit: Interventional Pain Management Treatment. CC: Back Pain (Upper/lower)  Procedure:          Anesthesia, Analgesia, Anxiolysis:  Type: Therapeutic Medial Branch Facet Block  #1  Region:Thoracic Level: T10, T11, T12, L1, L2 Medial Branch Level(s) Laterality: Bilateral  Type: Local Anesthesia Indication(s): Analgesia         Route: Infiltration ([place]/IM) IV Access: Declined Sedation: Declined  Local Anesthetic: Lidocaine 1-2%  Position: Prone   Indications: 1. Thoracic facet syndrome (Multilevel) (Bilateral)   2. Spondylosis without myelopathy or radiculopathy, thoracic region   3. DDD (degenerative disc disease), thoracolumbar   4. Compression fracture of T12 vertebra, sequela    Pain Score: Pre-procedure: 2 /10 Post-procedure: 0-No pain/10   Pre-op H&P Assessment:  Elizabeth Fields is a 87 y.o. (year old), female patient, seen today for interventional treatment. She  has a past surgical history that includes Cholecystectomy; Hip surgery; and Anterior approach hemi hip arthroplasty (Left, 02/12/2019). Elizabeth Fields has a current medication list which includes the following prescription(s):  acetaminophen, aspirin ec, cholecalciferol, doxepin, esomeprazole, feeding supplement, ferrous [id number] c-folic acid, furosemide, levothyroxine, magnesium oxide, metoprolol succinate, simvastatin, tramadol, and warfarin. Her primarily concern today is the Back Pain (Upper/lower)  Initial Vital Signs:  Pulse/HCG Rate: (!) 101ECG Heart Rate: 80 Temp: (!) 97.2 F (36.2 C) Resp: 18 BP: 137/84 SpO2: 97 %  BMI: Estimated body mass index is 18.58 kg/m as calculated from the following:   Height as of this encounter: 4\' 11"  (1.499 m).   Weight as of this encounter: 92 lb (41.7 kg).  Risk Assessment: Allergies: Reviewed. She is allergic to fosamax [alendronate sodium], other, and penicillins.  Allergy Precautions: None required Coagulopathies: Reviewed. None identified.  Blood-thinner therapy: None at this time Active Infection(s): Reviewed. None identified. Elizabeth Fields is afebrile  Site Confirmation: Elizabeth Fields was asked to confirm the procedure and laterality before marking the site Procedure checklist: Completed Consent: Before the procedure and under the influence of no sedative(s), amnesic(s), or anxiolytics, the patient was informed of the treatment options, risks and possible complications. To fulfill our ethical and legal obligations, as recommended by the American Medical Association's Code of Ethics, I have informed the patient of my clinical impression; the nature and purpose of the treatment or procedure; the risks, benefits, and possible complications of the intervention; the alternatives, including doing nothing; the risk(s) and benefit(s) of the alternative treatment(s) or procedure(s); and the risk(s) and benefit(s) of doing nothing. The patient was provided information about the general risks and possible complications associated with the procedure. These may include, but are not limited to: failure to achieve desired goals, infection, bleeding, organ or nerve damage, allergic  reactions, paralysis, and death. In addition, the patient was informed of those risks and complications associated to Spine-related procedures, such as failure  to decrease pain; infection (i.e.: Meningitis, epidural or intraspinal abscess); bleeding (i.e.: epidural hematoma, subarachnoid hemorrhage, or any other type of intraspinal or peri-dural bleeding); organ or nerve damage (i.e.: Any type of peripheral nerve, nerve root, or spinal cord injury) with subsequent damage to sensory, motor, and/or autonomic systems, resulting in permanent pain, numbness, and/or weakness of one or several areas of the body; allergic reactions; (i.e.: anaphylactic reaction); and/or death. Furthermore, the patient was informed of those risks and complications associated with the medications. These include, but are not limited to: allergic reactions (i.e.: anaphylactic or anaphylactoid reaction(s)); adrenal axis suppression; blood sugar elevation that in diabetics may result in ketoacidosis or comma; water retention that in patients with history of congestive heart failure may result in shortness of breath, pulmonary edema, and decompensation with resultant heart failure; weight gain; swelling or edema; medication-induced neural toxicity; particulate matter embolism and blood vessel occlusion with resultant organ, and/or nervous system infarction; and/or aseptic necrosis of one or more joints. Finally, the patient was informed that Medicine is not an exact science; therefore, there is also the possibility of unforeseen or unpredictable risks and/or possible complications that may result in a catastrophic outcome. The patient indicated having understood very clearly. We have given the patient no guarantees and we have made no promises. Enough time was given to the patient to ask questions, all of which were answered to the patient's satisfaction. Elizabeth Fields has indicated that she wanted to continue with the procedure. Attestation: I, the  ordering provider, attest that I have discussed with the patient the benefits, risks, side-effects, alternatives, likelihood of achieving goals, and potential problems during recovery for the procedure that I have provided informed consent. Date  Time: 03/29/2020 11:40 AM  Pre-Procedure Preparation:  Monitoring: As per clinic protocol. Respiration, ETCO2, SpO2, BP, heart rate and rhythm monitor placed and checked for adequate function Safety Precautions: Patient was assessed for positional comfort and pressure points before starting the procedure. Time-out: I initiated and conducted the "Time-out" before starting the procedure, as per protocol. The patient was asked to participate by confirming the accuracy of the "Time Out" information. Verification of the correct person, site, and procedure were performed and confirmed by me, the nursing staff, and the patient. "Time-out" conducted as per Joint Commission's Universal Protocol (UP.01.01.01). Time: 1338  Description of Procedure:          Target Area: The target area is a superior and lateral portion of the thoracic transverse processes more distal to the joint itself then and the lumbar region. Approach: Paraspinal approach. Area Prepped: Entire Posterior Thoracic Region DuraPrep (Iodine Povacrylex [0.7% available iodine] and Isopropyl Alcohol, 74% w/w) Safety Precautions: Aspiration looking for blood return was conducted prior to all injections. At no point did we inject any substances, as a needle was being advanced. No attempts were made at seeking any paresthesias. Safe injection practices and needle disposal techniques used. Medications properly checked for expiration dates. SDV (single dose vial) medications used. Description of the Procedure: Protocol guidelines were followed. The patient was placed in position over the fluoroscopy table. The target area was identified and the area prepped in the usual manner. Skin & deeper tissues infiltrated  with local anesthetic. Appropriate amount of time allowed to pass for local anesthetics to take effect. The procedure needles were then advanced to the target area. Proper needle placement secured. Negative aspiration confirmed. Solution injected in intermittent fashion, asking for systemic symptoms every 0.5cc of injectate. The needles were then removed and  the area cleansed, making sure to leave some of the prepping solution back to take advantage of its long term bactericidal properties.       Vitals:   03/29/20 1335 03/29/20 1340 03/29/20 1345 03/29/20 1348  BP: (!) 145/75 (!) 151/67 (!) 154/69 (!) 154/69  Pulse:      Resp: 18 18 18 18   Temp:      TempSrc:      SpO2: 100% 100% 100% 100%  Weight:      Height:        Start Time: 1338 hrs. End Time: 1347 hrs. Imaging Guidance (Spinal):          Type of Imaging Technique: Fluoroscopy Guidance (Spinal) Indication(s): Assistance in needle guidance and placement for procedures requiring needle placement in or near specific anatomical locations not easily accessible without such assistance. Exposure Time: Please see nurses notes. Contrast: None used. Fluoroscopic Guidance: I was personally present during the use of fluoroscopy. "Tunnel Vision Technique" used to obtain the best possible view of the target area. Parallax error corrected before commencing the procedure. "Direction-depth-direction" technique used to introduce the needle under continuous pulsed fluoroscopy. Once target was reached, antero-posterior, oblique, and lateral fluoroscopic projection used confirm needle placement in all planes. Images permanently stored in EMR.      Interpretation: No contrast injected. I personally interpreted the imaging intraoperatively. Adequate needle placement confirmed in multiple planes. Permanent images saved into the patient's record.  Antibiotic Prophylaxis:   Anti-infectives (From admission, onward)   None     Indication(s): None  identified  Post-operative Assessment:  Post-procedure Vital Signs:  Pulse/HCG Rate: (!) 10189 Temp: (!) 97.2 F (36.2 C) Resp: 18 BP: (!) 154/69 SpO2: 100 %  EBL: None  Complications: No immediate post-treatment complications observed by team, or reported by patient.  Note: The patient tolerated the entire procedure well. A repeat set of vitals were taken after the procedure and the patient was kept under observation following institutional policy, for this type of procedure. Post-procedural neurological assessment was performed, showing return to baseline, prior to discharge. The patient was provided with post-procedure discharge instructions, including a section on how to identify potential problems. Should any problems arise concerning this procedure, the patient was given instructions to immediately contact us, at any time, without hesitation. In any case, we plan to contact the patient by telephone for a follow-up status report regarding this interventional procedure.  Comments:  No additional relevant information.  Plan of Care  Orders:  Orders Placed This Encounter  Procedures  . THORACIC FACET BLOCK    Scheduling Instructions:     Thoracic Medial Branch Block     Side: Bilateral     Sedation: Patient's choice.     Timeframe: Today    Order Specific Question:   Where will this procedure be performed?    Answer:   [facility]  . [facility] C-ARM 1-60 MIN NO REPORT    Intraoperative interpretation by procedural physician at [facility].    Standing Status:   Standing    Number of Occurrences:   1    Order Specific Question:   Reason for exam:    Answer:   Assistance in needle guidance and placement for procedures requiring needle placement in or near specific anatomical locations not easily accessible without such assistance.  . Informed Consent Details: Physician/Practitioner Attestation; Transcribe to consent form and obtain patient signature     Scheduling Instructions:     Nursing Order:  Transcribe to consent form and obtain patient signature.     Note: Always confirm laterality of pain with Elizabeth Fields, before procedure.    Order Specific Question:   Physician/Practitioner attestation of informed consent for procedure/surgical case    Answer:   I, the physician/practitioner, attest that I have discussed with the patient the benefits, risks, side effects, alternatives, likelihood of achieving goals and potential problems during recovery for the procedure that I have provided informed consent.    Order Specific Question:   Procedure    Answer:   Diagnostic bilateral thoracic facet block    Order Specific Question:   Physician/Practitioner performing the procedure    Answer:   Kinan Safley A. Dossie Arbour, MD    Order Specific Question:   Indication/Reason    Answer:   Thoracic upper back pain associated with thoracic facet syndrome (U44.034) and thoracic spondylosis without myelopathy or radiculopathy (M47.814)  . Care order/instruction: Please confirm that the patient has stopped the Coumadin (Warfarin) X 5 days prior to procedure or surgery.    Please confirm that the patient has stopped the Coumadin (Warfarin) X 5 days prior to procedure or surgery.    Standing Status:   Standing    Number of Occurrences:   1  . Provide equipment / supplies at bedside    "Block Tray" (Disposable  single use) Needle type: SpinalSpinal Amount/quantity: 4 Size: Regular (3.5-inch) Gauge: 22G    Standing Status:   Standing    Number of Occurrences:   1    Order Specific Question:   Specify    Answer:   Block Tray  . Bleeding precautions    Standing Status:   Standing    Number of Occurrences:   1   Chronic Opioid Analgesic:  No opioid analgesics prescribed by our practice.  MME/day: 0 mg/day   Medications ordered for procedure: Meds ordered this encounter  Medications  . ropivacaine (PF) 2 mg/mL (0.2%) (NAROPIN) injection 18 mL  . dexamethasone  (DECADRON) injection 20 mg  . lidocaine (XYLOCAINE) 2 % (with pres) injection 400 mg  . DISCONTD: lactated ringers infusion 1,000 mL  . DISCONTD: midazolam (VERSED) 5 MG/5ML injection 1-2 mg    Make sure Flumazenil is available in the pyxis when using this medication. If oversedation occurs, administer 0.2 mg IV over 15 sec. If after 45 sec no response, administer 0.2 mg again over 1 min; may repeat at 1 min intervals; not to exceed 4 doses (1 mg)  . DISCONTD: fentaNYL (SUBLIMAZE) injection 25-50 mcg    Make sure Narcan is available in the pyxis when using this medication. In the event of respiratory depression (RR< 8/min): Titrate NARCAN (naloxone) in increments of 0.1 to 0.2 mg IV at 2-3 minute intervals, until desired degree of reversal.   Medications administered: We administered ropivacaine (PF) 2 mg/mL (0.2%), dexamethasone, and lidocaine.  See the medical record for exact dosing, route, and time of administration.  Follow-up plan:   Return in about 2 weeks (around 04/12/2020) for (F2F), (PPE).       Interventional Therapies  Risk  Complexity Considerations:   NOTE: Coumadin anticoagulation (Stop: 5 days  Restart: 2 hrs)   Planned  Pending:      Under consideration:   Diagnostic/therapeutic Lumbar/thoracic ESIs.   Completed:   Diagnostic bilateral lumbar facet MBB (L2, L3, L4, L5, and S1) x2 (03/08/2020)  Diagnostic bilateral thoracolumbar facet MBB (T10, T11, T12, L1, L2) x1 (03/29/2020)    Therapeutic  Palliative (PRN) options:  Diagnostic bilateral lumbar facet block #2     Recent Visits Date Type Provider Dept  03/22/20 Office Visit Milinda Pointer, MD [facility]  03/08/20 Procedure visit Milinda Pointer, MD [facility]  03/02/20 Office Visit Milinda Pointer, MD [facility]  02/03/20 Office Visit Milinda Pointer, MD [facility]  Showing recent visits within past 90 days and meeting all other requirements Today's  Visits Date Type Provider Dept  03/29/20 Procedure visit Milinda Pointer, MD [facility]  Showing today's visits and meeting all other requirements Future Appointments Date Type Provider Dept  04/14/20 Appointment Milinda Pointer, MD [facility]  Showing future appointments within next 90 days and meeting all other requirements  Disposition: Discharge home  Discharge (Date  Time): 03/29/2020; 1400 hrs.   Primary Care Physician: Baxter Hire, MD Location: [facility] Note by: Gaspar Cola, MD Date: 03/29/2020; Time: 3:12 PM  Disclaimer:  Medicine is not an Chief Strategy Officer. The only guarantee in medicine is that nothing is guaranteed. It is important to note that the decision to proceed with this intervention was based on the information collected from the patient. The Data and conclusions were drawn from the patient's questionnaire, the interview, and the physical examination. Because the information was provided in large part by the patient, it cannot be guaranteed that it has not been purposely or unconsciously manipulated. Every effort has been made to obtain as much relevant data as possible for this evaluation. It is important to note that the conclusions that lead to this procedure are derived in large part from the available data. Always take into account that the treatment will also be dependent on availability of resources and existing treatment guidelines, considered by other Pain Management Practitioners as being common knowledge and practice, at the time of the intervention. For Medico-Legal purposes, it is also important to point out that variation in procedural techniques and pharmacological choices are the acceptable norm. The indications, contraindications, technique, and results of the above procedure should only be interpreted and judged by a Board-Certified Interventional Pain Specialist with extensive familiarity and  expertise in the same exact procedure and technique.

## 2020-03-29 NOTE — Patient Instructions (Addendum)
____________________________________________________________________________________________  Post-Procedure Discharge Instructions  Instructions:  Apply ice:   Purpose: This will minimize any swelling and discomfort after procedure.   When: Day of procedure, as soon as you get home.  How: Fill a plastic sandwich bag with crushed ice. Cover it with a small towel and apply to injection site.  How long: (15 min on, 15 min off) Apply for 15 minutes then remove x 15 minutes.  Repeat sequence on day of procedure, until you go to bed.  Apply heat:   Purpose: To treat any soreness and discomfort from the procedure.  When: Starting the next day after the procedure.  How: Apply heat to procedure site starting the day following the procedure.  How long: May continue to repeat daily, until discomfort goes away.  Food intake: Start with clear liquids (like water) and advance to regular food, as tolerated.   Physical activities: Keep activities to a minimum for the first 8 hours after the procedure. After that, then as tolerated.  Driving: If you have received any sedation, be responsible and do not drive. You are not allowed to drive for 24 hours after having sedation.  Blood thinner: (Applies only to those taking blood thinners) You may restart your blood thinner 6 hours after your procedure.  Insulin: (Applies only to Diabetic patients taking insulin) As soon as you can eat, you may resume your normal dosing schedule.  Infection prevention: Keep procedure site clean and dry. Shower daily and clean area with soap and water.  Post-procedure Pain Diary: Extremely important that this be done correctly and accurately. Recorded information will be used to determine the next step in treatment. For the purpose of accuracy, follow these rules:  Evaluate only the area treated. Do not report or include pain from an untreated area. For the purpose of this evaluation, ignore all other areas of pain,  except for the treated area.  After your procedure, avoid taking a long nap and attempting to complete the pain diary after you wake up. Instead, set your alarm clock to go off every hour, on the hour, for the initial 8 hours after the procedure. Document the duration of the numbing medicine, and the relief you are getting from it.  Do not go to sleep and attempt to complete it later. It will not be accurate. If you received sedation, it is likely that you were given a medication that may cause amnesia. Because of this, completing the diary at a later time may cause the information to be inaccurate. This information is needed to plan your care.  Follow-up appointment: Keep your post-procedure follow-up evaluation appointment after the procedure (usually 2 weeks for most procedures, 6 weeks for radiofrequencies). DO NOT FORGET to bring you pain diary with you.   Expect: (What should I expect to see with my procedure?)  From numbing medicine (AKA: Local Anesthetics): Numbness or decrease in pain. You may also experience some weakness, which if present, could last for the duration of the local anesthetic.  Onset: Full effect within 15 minutes of injected.  Duration: It will depend on the type of local anesthetic used. On the average, 1 to 8 hours.   From steroids (Applies only if steroids were used): Decrease in swelling or inflammation. Once inflammation is improved, relief of the pain will follow.  Onset of benefits: Depends on the amount of swelling present. The more swelling, the longer it will take for the benefits to be seen. In some cases, up to 10 days.    Duration: Steroids will stay in the system x 2 weeks. Duration of benefits will depend on multiple posibilities including persistent irritating factors.  Side-effects: If present, they may typically last 2 weeks (the duration of the steroids).  Frequent: Cramps (if they occur, drink Gatorade and take over-the-counter Magnesium 450-500 mg  once to twice a day); water retention with temporary weight gain; increases in blood sugar; decreased immune system response; increased appetite.  Occasional: Facial flushing (red, warm cheeks); mood swings; menstrual changes.  Uncommon: Long-term decrease or suppression of natural hormones; bone thinning. (These are more common with higher doses or more frequent use. This is why we prefer that our patients avoid having any injection therapies in other practices.)   Very Rare: Severe mood changes; psychosis; aseptic necrosis.  From procedure: Some discomfort is to be expected once the numbing medicine wears off. This should be minimal if ice and heat are applied as instructed.  Call if: (When should I call?)  You experience numbness and weakness that gets worse with time, as opposed to wearing off.  New onset bowel or bladder incontinence. (Applies only to procedures done in the spine)  Emergency Numbers:  Durning business hours (Monday - Thursday, 8:00 AM - 4:00 PM) (Friday, 9:00 AM - 12:00 Noon): [phone]  After hours: [phone]  NOTE: If you are having a problem and are unable connect with, or to talk to a provider, then go to your nearest urgent care or emergency department. If the problem is serious and urgent, please call 911. ____________________________________________________________________________________________   Epidural Steroid Injection Patient Information  Description: The epidural space surrounds the nerves as they exit the spinal cord.  In some patients, the nerves can be compressed and inflamed by a bulging disc or a tight spinal canal (spinal stenosis).  By injecting steroids into the epidural space, we can bring irritated nerves into direct contact with a potentially helpful medication.  These steroids act directly on the irritated nerves and can reduce swelling and inflammation which often leads to decreased pain.  Epidural steroids may be injected  anywhere along the spine and from the neck to the low back depending upon the location of your pain.   After numbing the skin with local anesthetic (like Novocaine), a small needle is passed into the epidural space slowly.  You may experience a sensation of pressure while this is being done.  The entire block usually last less than 10 minutes.  Conditions which may be treated by epidural steroids:   Low back and leg pain  Neck and arm pain  Spinal stenosis  Post-laminectomy syndrome  Herpes zoster (shingles) pain  Pain from compression fractures  Preparation for the injection:  1. Do not eat any solid food or dairy products within 8 hours of your appointment.  2. You may drink clear liquids up to 3 hours before appointment.  Clear liquids include water, black coffee, juice or soda.  No milk or cream please. 3. You may take your regular medication, including pain medications, with a sip of water before your appointment  Diabetics should hold regular insulin (if taken separately) and take 1/2 normal NPH dos the morning of the procedure.  Carry some sugar containing items with you to your appointment. 4. A driver must accompany you and be prepared to drive you home after your procedure.  5. Bring all your current medications with your. 6. An IV may be inserted and sedation may be given at the discretion of the physician.  7. A blood pressure cuff, EKG and other monitors will often be applied during the procedure.  Some patients may need to have extra oxygen administered for a short period. 8. You will be asked to provide medical information, including your allergies, prior to the procedure.  We must know immediately if you are taking blood thinners (like Coumadin/Warfarin)  Or if you are allergic to IV iodine contrast (dye). We must know if you could possible be pregnant.  Possible side-effects:  Bleeding from needle site  Infection (rare, may require surgery)  Nerve injury  (rare)  Numbness & tingling (temporary)  Difficulty urinating (rare, temporary)  Spinal headache ( a headache worse with upright posture)  Light -headedness (temporary)  Pain at injection site (several days)  Decreased blood pressure (temporary)  Weakness in arm/leg (temporary)  Pressure sensation in back/neck (temporary)  Call if you experience:  Fever/chills associated with headache or increased back/neck pain.  Headache worsened by an upright position.  New onset weakness or numbness of an extremity below the injection site  Hives or difficulty breathing (go to the emergency room)  Inflammation or drainage at the infection site  Severe back/neck pain  Any new symptoms which are concerning to you  Please note:  Although the local anesthetic injected can often make your back or neck feel good for several hours after the injection, the pain will likely return.  It takes 3-7 days for steroids to work in the epidural space.  You may not notice any pain relief for at least that one week.  If effective, we will often do a series of three injections spaced 3-6 weeks apart to maximally decrease your pain.  After the initial series, we generally will wait several months before considering a repeat injection of the same type.  If you have any questions, please call [phone] [facility] Discharge Inst[facility]  General Discharge Instructions :  If you need to reach your doctor call: Monday-Friday 8:00 am - 4:00 pm at [phone] or toll free [phone].  After clinic hours [phone] to have operator reach doctor.  Bring all of your medication bottles to all your appointments in the pain clinic.  To cancel or reschedule your appointment with Pain Management please remember to call 24 hours in advance to avoid a fee.  Refer to the educational materials which you have been given on: General Risks, I had my Procedure.  Discharge Instructions, Post Sedation.  Post Procedure Instructions:  The drugs you were given will stay in your system until tomorrow, so for the next 24 hours you should not drive, make any legal decisions or drink any alcoholic beverages.  You may eat anything you prefer, but it is better to start with liquids then soups and crackers, and gradually work up to solid foods.  Please notify your doctor immediately if you have any unusual bleeding, trouble breathing or pain that is not related to your normal pain.  Depending on the type of procedure that was done, some parts of your body may feel week and/or numb.  This usually clears up by tonight or the next day.  Walk with the use of an assistive device or accompanied by an adult for the 24 hours.  You may use ice on the affected area for the first 24 hours.  Put ice in a Ziploc bag and cover with a towel and place against area 15 minutes on 15 minutes off.  You may switch to heat after   24 hours. Facet Joint Block The facet joints connect the bones of the spine (vertebrae). They make it possible for you to bend, twist, and make other movements with your spine. They also keep you from bending too far, twisting too far, and making other extreme movements. A facet joint block is a procedure in which a numbing medicine (anesthetic) is injected into a facet joint. In many cases, an anti-inflammatory medicine (steroid) is also injected. A facet joint block may be done:  To diagnose neck or back pain. If the pain gets better after a facet joint block, it means the pain is probably coming from the facet joint. If the pain does not get better, it means the pain is probably not coming from the facet joint.  To relieve neck or back pain that is caused by an inflamed facet joint. A facet joint block is only done to relieve pain if the pain does not improve with other methods, such as medicine, exercise programs, and physical therapy. Tell a health care  provider about:  Any allergies you have.  All medicines you are taking, including vitamins, herbs, eye drops, creams, and over-the-counter medicines.  Any problems you or family members have had with anesthetic medicines.  Any blood disorders you have.  Any surgeries you have had.  Any medical conditions you have or have had.  Whether you are pregnant or may be pregnant. What are the risks? Generally, this is a safe procedure. However, problems may occur, including:  Bleeding.  Injury to a nerve near the injection site.  Pain at the injection site.  Weakness or numbness in areas controlled by nerves near the injection site.  Infection.  Temporary fluid retention.  Allergic reactions to medicines or dyes.  Injury to other structures or organs near the injection site. What happens before the procedure? Medicines Ask your health care provider about:  Changing or stopping your regular medicines. This is especially important if you are taking diabetes medicines or blood thinners.  Taking medicines such as aspirin and ibuprofen. These medicines can thin your blood. Do not take these medicines unless your health care provider tells you to take them.  Taking over-the-counter medicines, vitamins, herbs, and supplements. Eating and drinking Follow instructions from your health care provider about eating and drinking, which may include:  8 hours before the procedure - stop eating heavy meals or foods, such as meat, fried foods, or fatty foods.  6 hours before the procedure - stop eating light meals or foods, such as toast or cereal.  6 hours before the procedure - stop drinking milk or drinks that contain milk.  2 hours before the procedure - stop drinking clear liquids. Staying hydrated Follow instructions from your health care provider about hydration, which may include:  Up to 2 hours before the procedure - you may continue to drink clear liquids, such as water, clear  fruit juice, black coffee, and plain tea. General instructions  Do not use any products that contain nicotine or tobacco for at least 4-6 weeks before the procedure. These products include cigarettes, e-cigarettes, and chewing tobacco. If you need help quitting, ask your health care provider.  Plan to have someone take you home from the hospital or clinic.  Ask your health care provider: ? How your surgery site will be marked. ? What steps will be taken to help prevent infection. These may include:  Removing hair at the surgery site.  Washing skin with a germ-killing soap.  Receiving antibiotic medicine.  What happens during the procedure?  You will put on a hospital gown.  You will lie on your stomach on an X-ray table. You may be asked to lie in a different position if an injection will be made in your neck.  Machines will be used to monitor your oxygen levels, heart rate, and blood pressure.  Your skin will be cleaned.  If an injection will be made in your neck, an IV will be inserted into one of your veins. Fluids and medicine will flow directly into your body through the IV.  A numbing medicine (local anesthetic) will be applied to your skin. Your skin may sting or burn for a moment.  A video X-ray machine (fluoroscopy) will be used to find the joint. In some cases, a CT scan may be used.  A contrast dye may be injected into the facet joint area to help find the joint.  When the joint is located, an anesthetic will be injected into the joint through the needle.  Your health care provider will ask you whether you feel pain relief. ? If you feel relief, a steroid may be injected to provide pain relief for a longer period of time. ? If you do not feel relief or feel only partial relief, additional injections of an anesthetic may be made in other facet joints.  The needle will be removed.  Your skin will be cleaned.  A bandage (dressing) will be applied over each injection  site. The procedure may vary among health care providers and hospitals.   What happens after the procedure?  Your blood pressure, heart rate, breathing rate, and blood oxygen level will be monitored until you leave the hospital or clinic.  You will lie down and rest for a period of time. Summary  A facet joint block is a procedure in which a numbing medicine (anesthetic) is injected into a facet joint. An anti-inflammatory medicine (stereoid) may also be injected.  Follow instructions from your health care provider about medicines and eating and drinking before the procedure.  Do not use any products that contain nicotine or tobacco for at least 4-6 weeks before the procedure.  You will lie on your stomach for the procedure, but you may be asked to lie in a different position if an injection will be made in your neck.  When the joint is located, an anesthetic will be injected into the joint through the needle. This information is not intended to replace advice given to you by your health care provider. Make sure you discuss any questions you have with your health care provider. Document Revised: 04/17/2018 Document Reviewed: 11/29/2017 Elsevier Patient Education  2021 [employer].

## 2020-03-29 NOTE — Telephone Encounter (Signed)
Called to make sure patient was OK after she vomited prior to getting in the car at discharge. Husband says she is lying down at present and has had no further vomiting or complaints other than mild nausea.[name] him to call for any issues and to encourage POs when she awakes. He verbalized understanding.

## 2020-03-29 NOTE — Progress Notes (Signed)
Safety precautions to be maintained throughout the outpatient stay will include: orient to surroundings, keep bed in low position, maintain call bell within reach at all times, provide assistance with transfer out of bed and ambulation.  

## 2020-03-30 ENCOUNTER — Telehealth: Payer: Self-pay | Admitting: *Deleted

## 2020-03-30 NOTE — Telephone Encounter (Signed)
Called patient. States she had a good night without any further vomiting. Was able to tolerate POs well. No other issues verbalized. Instructed her to call for any problems or concerns.

## 2020-03-31 DIAGNOSIS — K449 Diaphragmatic hernia without obstruction or gangrene: Secondary | ICD-10-CM | POA: Diagnosis not present

## 2020-03-31 DIAGNOSIS — D519 Vitamin B12 deficiency anemia, unspecified: Secondary | ICD-10-CM | POA: Diagnosis not present

## 2020-03-31 DIAGNOSIS — E034 Atrophy of thyroid (acquired): Secondary | ICD-10-CM | POA: Diagnosis not present

## 2020-03-31 DIAGNOSIS — Z0000 Encounter for general adult medical examination without abnormal findings: Secondary | ICD-10-CM | POA: Diagnosis not present

## 2020-03-31 DIAGNOSIS — N1832 Chronic kidney disease, stage 3b: Secondary | ICD-10-CM | POA: Diagnosis not present

## 2020-03-31 DIAGNOSIS — I480 Paroxysmal atrial fibrillation: Secondary | ICD-10-CM | POA: Diagnosis not present

## 2020-03-31 DIAGNOSIS — I2510 Atherosclerotic heart disease of native coronary artery without angina pectoris: Secondary | ICD-10-CM | POA: Diagnosis not present

## 2020-03-31 DIAGNOSIS — E785 Hyperlipidemia, unspecified: Secondary | ICD-10-CM | POA: Diagnosis not present

## 2020-04-04 DIAGNOSIS — E538 Deficiency of other specified B group vitamins: Secondary | ICD-10-CM | POA: Diagnosis not present

## 2020-04-04 DIAGNOSIS — I480 Paroxysmal atrial fibrillation: Secondary | ICD-10-CM | POA: Diagnosis not present

## 2020-04-14 ENCOUNTER — Ambulatory Visit: Payer: PPO | Attending: Pain Medicine | Admitting: Pain Medicine

## 2020-04-14 ENCOUNTER — Encounter: Payer: Self-pay | Admitting: Pain Medicine

## 2020-04-14 ENCOUNTER — Other Ambulatory Visit: Payer: Self-pay

## 2020-04-14 VITALS — BP 137/65 | HR 94 | Temp 97.2°F | Resp 16 | Ht 59.0 in | Wt 92.0 lb

## 2020-04-14 DIAGNOSIS — S22080S Wedge compression fracture of T11-T12 vertebra, sequela: Secondary | ICD-10-CM | POA: Diagnosis not present

## 2020-04-14 DIAGNOSIS — M47816 Spondylosis without myelopathy or radiculopathy, lumbar region: Secondary | ICD-10-CM | POA: Diagnosis not present

## 2020-04-14 DIAGNOSIS — M546 Pain in thoracic spine: Secondary | ICD-10-CM | POA: Diagnosis not present

## 2020-04-14 DIAGNOSIS — G8929 Other chronic pain: Secondary | ICD-10-CM | POA: Insufficient documentation

## 2020-04-14 DIAGNOSIS — S32010S Wedge compression fracture of first lumbar vertebra, sequela: Secondary | ICD-10-CM | POA: Insufficient documentation

## 2020-04-14 DIAGNOSIS — Z7901 Long term (current) use of anticoagulants: Secondary | ICD-10-CM | POA: Insufficient documentation

## 2020-04-14 DIAGNOSIS — M545 Low back pain, unspecified: Secondary | ICD-10-CM | POA: Insufficient documentation

## 2020-04-14 DIAGNOSIS — G894 Chronic pain syndrome: Secondary | ICD-10-CM | POA: Diagnosis not present

## 2020-04-14 DIAGNOSIS — M47894 Other spondylosis, thoracic region: Secondary | ICD-10-CM | POA: Diagnosis not present

## 2020-04-14 NOTE — Progress Notes (Signed)
Safety precautions to be maintained throughout the outpatient stay will include: orient to surroundings, keep bed in low position, maintain call bell within reach at all times, provide assistance with transfer out of bed and ambulation.  

## 2020-04-14 NOTE — Progress Notes (Signed)
PROVIDER NOTE: Information contained herein reflects review and annotations entered in association with encounter. Interpretation of such information and data should be left to medically-trained personnel. Information provided to patient can be located elsewhere in the medical record under "Patient Instructions". Document created using STT-dictation technology, any transcriptional errors that may result from process are unintentional.    Patient: Elizabeth Fields  Service Category: E/M  Provider: Gaspar Cola, MD  DOB: 05-29-1931  DOS: 04/14/2020  Specialty: Interventional Pain Management  MRN: [id number]  Setting: Ambulatory outpatient  PCP: Baxter Hire, MD  Type: Established Patient    Referring Provider: Baxter Hire, MD  Location: Office  Delivery: Face-to-face     HPI  [name], a 87 y.o. year old female, is here today because of her Chronic midline thoracic back pain [M54.6, G89.29]. Ms. Artley primary complain today is Back Pain (Mid to low) Last encounter: My last encounter with her was on 03/29/2020. Pertinent problems: Ms. Chatterjee has Chronic sacroiliac joint pain (Left); Lumbar facet syndrome (Bilateral); Chronic low back pain (1ry area of Pain) (Bilateral) w/o sciatica; Lumbar spondylosis; Chronic pain syndrome; Hip fracture, sequela (Left); Multiple chronic diseases; Compression fracture of L4 vertebra, sequela; Compression fracture of L1 vertebra, sequela ; Compression fracture of T12 vertebra, sequela; Abnormal MRI, lumbar spine (11/26/2018); DDD (degenerative disc disease), lumbosacral; DDD (degenerative disc disease), thoracolumbar; Osteoarthritis of sacroiliac joints (HCC) (Bilateral); Chronic sacroiliac joint pain (Bilateral); Chronic midline thoracic back pain; DDD (degenerative disc disease), cervical; Spondylosis without myelopathy or radiculopathy, lumbosacral region; Osteoarthritis of facet joint of lumbar spine; Thoracic facet syndrome (Multilevel) (Bilateral); and  Spondylosis without myelopathy or radiculopathy, thoracic region on their pertinent problem list. Pain Assessment: Severity of Chronic pain is reported as a 6 /10. Location: Back Mid,Lower/denies. Onset: More than a month ago. Quality: Aching,Constant. Timing: Constant. Modifying factor(s): sometimes injections. Vitals:  height is '4\' 11"'  (1.499 m) and weight is 92 lb (41.7 kg). Her temperature is 97.2 F (36.2 C) (abnormal). Her blood pressure is 137/65 and her pulse is 94. Her respiration is 16 and oxygen saturation is 98%.   Reason for encounter: post-procedure assessment.  The patient returns to the clinic today after having had a diagnostic bilateral T10-L1 thoracic MBB #1 under fluoroscopic guidance, no sedation.  She indicates that contrary to the diagnostic bilateral lumbar facet blocks, these did not provide her with any benefit whatsoever.  The results of this diagnostic injection in conjunction with the diagnostic bilateral lumbar facet blocks would suggest that the pain in the lower portion of the back is in fact coming from the lumbar facet joints in that area as shown by 80 to 100% relief of the pain with the diagnostic injections.  However, the pain in the mid back, appears not to be related to the facet joints suggesting that it is likely to be coming from the patient's T12 and L1 vertebral body compression fractures.  To prove this theory, we will be scheduling the patient to come in for a T12-L1 lumbar epidural steroid injection under fluoroscopic guidance, to determine if this provides her with relief of the pain in that area.  If it does, then we may consider the possibility of doing a series of 3 thoracolumbar epidural steroid injections.  However in the case of the lower back, we have already proven that the pain in that area is in fact coming from the lumbar facet joints and therefore we will be requesting preapproval for a bilateral lumbar facet  radiofrequency ablation under fluoroscopic  guidance.  When doing the radiofrequency ablation of the lumbar facets, we will aim to do the left side first followed by the right side.  This plan was shared with the patient who appears to have understood and accepted.  Post-Procedure Evaluation  Procedure (03/29/2020): Diagnostic bilateral T10-L1 thoracic medial branch facet joint block #1 under fluoroscopic guidance, no sedation Pre-procedure pain level: 2/10 Post-procedure: 0/10 (100% relief)  Sedation: None.  Effectiveness during initial hour after procedure(Ultra-Short Term Relief): 0 %.  Local anesthetic used: Long-acting (4-6 hours) Effectiveness: Defined as any analgesic benefit obtained secondary to the administration of local anesthetics. This carries significant diagnostic value as to the etiological location, or anatomical origin, of the pain. Duration of benefit is expected to coincide with the duration of the local anesthetic used.  Effectiveness during initial 4-6 hours after procedure(Short-Term Relief): 0 %.  Long-term benefit: Defined as any relief past the pharmacologic duration of the local anesthetics.  Effectiveness past the initial 6 hours after procedure(Long-Term Relief): 10 % (walking a little better).  Current benefits: Defined as benefit that persist at this time.   Analgesia:  No benefit Function: No benefit ROM: No benefit  Pharmacotherapy Assessment   Analgesic: No opioid analgesics prescribed by our practice.  MME/day: 0 mg/day   Monitoring: [place] PMP: PDMP reviewed during this encounter.       Pharmacotherapy: No side-effects or adverse reactions reported. Compliance: No problems identified. Effectiveness: Clinically acceptable.  Dewayne Shorter, RN  04/14/2020  2:28 PM  Signed Safety precautions to be maintained throughout the outpatient stay will include: orient to surroundings, keep bed in low position, maintain call bell within reach at all times, provide assistance with transfer out of bed and  ambulation.    UDS:  Summary  Date Value Ref Range Status  02/03/2020 Note  Final    Comment:    ==================================================================== Compliance Drug Analysis, Ur ==================================================================== Specimen Alert Note: Urinary creatinine is low; ability to detect some drugs may be compromised. Interpret results with caution. (Creatinine) ==================================================================== Test                             Result       Flag       Units  Drug Present and Declared for Prescription Verification   Acetaminophen                  PRESENT      EXPECTED   Metoprolol                     PRESENT      EXPECTED  Drug Absent but Declared for Prescription Verification   Hydrocodone                    Not Detected UNEXPECTED ng/mg creat   Doxepin                        Not Detected UNEXPECTED   Salicylate                     Not Detected UNEXPECTED    Aspirin, as indicated in the declared medication list, is not always    detected even when used as directed.  ==================================================================== Test                      Result  Flag   Units      Ref Range   Creatinine              17        LL     mg/dL      >=20 ==================================================================== Declared Medications:  The flagging and interpretation on this report are based on the  following declared medications.  Unexpected results may arise from  inaccuracies in the declared medications.   **Note: The testing scope of this panel includes these medications:   Doxepin  Hydrocodone  Metoprolol   **Note: The testing scope of this panel does not include small to  moderate amounts of these reported medications:   Acetaminophen  Aspirin   **Note: The testing scope of this panel does not include the  following reported medications:   Cholecalciferol  Esomeprazole   Furosemide  Levothyroxine  Magnesium (Mag-Ox)  Multivitamin  Simvastatin  Supplement  Warfarin ==================================================================== For clinical consultation, please call [phone]. ====================================================================      ROS  Constitutional: Denies any fever or chills Gastrointestinal: No reported hemesis, hematochezia, vomiting, or acute GI distress Musculoskeletal: Denies any acute onset joint swelling, redness, loss of ROM, or weakness Neurological: No reported episodes of acute onset apraxia, aphasia, dysarthria, agnosia, amnesia, paralysis, loss of coordination, or loss of consciousness  Medication Review  Cholecalciferol, acetaminophen, aspirin EC, doxepin, esomeprazole, feeding supplement, ferrous [id number] C-folic acid, furosemide, levothyroxine, magnesium oxide, metoprolol succinate, simvastatin, traMADol, and warfarin  History Review  Allergy: Ms. Hewson is allergic to fosamax [alendronate sodium], other, and penicillins. Drug: Ms. Landgrebe  reports no history of drug use. Alcohol:  reports no history of alcohol use. Tobacco:  reports that she has never smoked. She has never used smokeless tobacco. Social: Ms. Galati  reports that she has never smoked. She has never used smokeless tobacco. She reports that she does not drink alcohol and does not use drugs. Medical:  has a past medical history of Actinic keratosis, Actinic keratosis (11/02/2019), Allergy, Atrial fibrillation ([place]), CHF (congestive heart failure) ([place]), History of squamous cell carcinoma, Hyperlipidemia, Hypertension, Renal insufficiency, Squamous cell carcinoma of skin (02/25/2017), and Thyroid disease. Surgical: Ms. Hoffmann  has a past surgical history that includes Cholecystectomy; Hip surgery; and Anterior approach hemi hip arthroplasty (Left, 02/12/2019). Family: family history includes Breast cancer (age of onset: 4) in her mother; Heart  disease in her father.  Laboratory Chemistry Profile   Renal Lab Results  Component Value Date   BUN 23 02/03/2020   CREATININE 1.36 (H) 02/03/2020   BCR 17 02/03/2020   GFRAA 41 (L) 02/03/2020   GFRNONAA 35 (L) 02/03/2020     Hepatic Lab Results  Component Value Date   AST 25 02/03/2020   ALBUMIN 4.9 (H) 02/03/2020   ALKPHOS 49 02/03/2020     Electrolytes Lab Results  Component Value Date   NA 140 02/03/2020   K 4.5 02/03/2020   CL 101 02/03/2020   CALCIUM 9.5 02/03/2020   MG 2.3 02/03/2020     Bone Lab Results  Component Value Date   25OHVITD1 53 02/03/2020   25OHVITD2 <1.0 02/03/2020   25OHVITD3 53 02/03/2020     Inflammation (CRP: Acute Phase) (ESR: Chronic Phase) Lab Results  Component Value Date   CRP <1 02/03/2020   ESRSEDRATE 34 02/03/2020   LATICACIDVEN 1.5 02/15/2019       Note: Above Lab results reviewed.  Recent Imaging Review  DG PAIN CLINIC C-ARM 1-60 MIN NO REPORT Fluoro was used, but  no Radiologist interpretation will be provided.  Please refer to "NOTES" tab for provider progress note. Note: Reviewed        Physical Exam  General appearance: Well nourished, well developed, and well hydrated. In no apparent acute distress Mental status: Alert, oriented x 3 (person, place, & time)       Respiratory: No evidence of acute respiratory distress Eyes: PERLA Vitals: BP 137/65   Pulse 94   Temp (!) 97.2 F (36.2 C)   Resp 16   Ht '4\' 11"'  (1.499 m)   Wt 92 lb (41.7 kg)   SpO2 98%   BMI 18.58 kg/m  BMI: Estimated body mass index is 18.58 kg/m as calculated from the following:   Height as of this encounter: '4\' 11"'  (1.499 m).   Weight as of this encounter: 92 lb (41.7 kg). Ideal: Female patients must weigh at least 45.5 kg to calculate ideal body weight  Assessment   Status Diagnosis  Controlled Controlled Controlled 1. Chronic midline thoracic back pain   2. Chronic low back pain (1ry area of Pain) (Bilateral) w/o sciatica   3.  Lumbar facet syndrome (Bilateral)   4. Compression fracture of L1 vertebra, sequela    5. Compression fracture of T12 vertebra, sequela   6. Chronic pain syndrome   7. Chronic anticoagulation (Coumadin)      Updated Problems: No problems updated.  Plan of Care  Problem-specific:  No problem-specific Assessment & Plan notes found for this encounter.  Ms. MADYSIN CRISP has a current medication list which includes the following long-term medication(s): doxepin, esomeprazole, furosemide, levothyroxine, metoprolol succinate, simvastatin, and warfarin.  Pharmacotherapy (Medications Ordered): No orders of the defined types were placed in this encounter.  Orders:  Orders Placed This Encounter  Procedures  . Lumbar Epidural Injection    Standing Status:   Future    Standing Expiration Date:   05/14/2020    Scheduling Instructions:     Procedure: Interlaminar Lumbar Epidural Steroid injection (LESI)  T12-L1     Laterality: Left-sided     Sedation: Patient's choice.     Timeframe: ASAA    Order Specific Question:   Where will this procedure be performed?    Answer:   [facility]  . Radiofrequency,Lumbar    Standing Status:   Future    Standing Expiration Date:   04/14/2021    Scheduling Instructions:     Side(s): Left-sided     Level: L3-4, L4-5, & L5-S1 Facets (L2, L3, L4, L5, & S1 Medial Branch Nerves)     Sedation: Patient's choice.     Scheduling Timeframe: After having completed the lumbar epidural steroid injection.    Order Specific Question:   Where will this procedure be performed?    Answer:   [facility]  . Blood Thinner Instructions to Nursing    Always make sure patient has clearance from prescribing physician to stop blood thinners for interventional therapies. If the patient requires a Lovenox-bridge therapy, make sure arrangements are made to institute it with the assistance of the PCP.    Scheduling Instructions:     Have Ms. Birkel stop the Coumadin  (Warfarin) X 5 days prior to procedure or surgery.   Follow-up plan:   Return for Procedure (no sedation): (L) T12-L1 LESI #1, (Blood Thinner Protocol).      Interventional Therapies  Risk  Complexity Considerations:   NOTE: Coumadin anticoagulation (Stop: 5 days  Restart: 2 hrs)   Planned  Pending:  Diagnostic left T12-L1 LESI #1  Therapeutic bilateral Lumbar Facet RFA (starting with left side)   Under consideration:   Diagnostic/therapeutic Lumbar/thoracic ESIs.   Completed:   Diagnostic bilateral lumbar facet MBB (L2, L3, L4, L5, and S1) x2 (03/08/2020) (100/100/80/80)  Diagnostic bilateral thoracolumbar facet MBB (T10, T11, T12, L1, L2) x1 (03/29/2020) (0/0/10/0)    Therapeutic  Palliative (PRN) options:   Diagnostic bilateral lumbar facet block #2     Recent Visits Date Type Provider Dept  03/29/20 Procedure visit Milinda Pointer, MD [facility]  03/22/20 Office Visit Milinda Pointer, MD [facility]  03/08/20 Procedure visit Milinda Pointer, MD [facility]  03/02/20 Office Visit Milinda Pointer, MD [facility]  02/03/20 Office Visit Milinda Pointer, MD [facility]  Showing recent visits within past 90 days and meeting all other requirements Today's Visits Date Type Provider Dept  04/14/20 Office Visit Milinda Pointer, MD [facility]  Showing today's visits and meeting all other requirements Future Appointments Date Type Provider Dept  04/19/20 Appointment Milinda Pointer, MD [facility]  Showing future appointments within next 90 days and meeting all other requirements  I discussed the assessment and treatment plan with the patient. The patient was provided an opportunity to ask questions and all were answered. The patient agreed with the plan and demonstrated an understanding of the instructions.  Patient advised to call back or seek an in-person evaluation if the symptoms or  condition worsens.  Duration of encounter: 40 minutes.  Note by: Gaspar Cola, MD Date: 04/14/2020; Time: 4:51 PM

## 2020-04-14 NOTE — Patient Instructions (Signed)
____________________________________________________________________________________________  Preparing for your procedure (without sedation)  Procedure appointments are limited to planned procedures: . No Prescription Refills. . No disability issues will be discussed. . No medication changes will be discussed.  Instructions: . Oral Intake: Do not eat or drink anything for at least 6 hours prior to your procedure. (Exception: Blood Pressure Medication. See below.) . Transportation: Unless otherwise stated by your physician, you may drive yourself after the procedure. . Blood Pressure Medicine: Do not forget to take your blood pressure medicine with a sip of water the morning of the procedure. If your Diastolic (lower reading)is above 100 mmHg, elective cases will be cancelled/rescheduled. . Blood thinners: These will need to be stopped for procedures. Notify our staff if you are taking any blood thinners. Depending on which one you take, there will be specific instructions on how and when to stop it. . Diabetics on insulin: Notify the staff so that you can be scheduled 1st case in the morning. If your diabetes requires high dose insulin, take only  of your normal insulin dose the morning of the procedure and notify the staff that you have done so. . Preventing infections: Shower with an antibacterial soap the morning of your procedure.  . Build-up your immune system: Take 1000 mg of Vitamin C with every meal (3 times a day) the day prior to your procedure. . Antibiotics: Inform the staff if you have a condition or reason that requires you to take antibiotics before dental procedures. . Pregnancy: If you are pregnant, call and cancel the procedure. . Sickness: If you have a cold, fever, or any active infections, call and cancel the procedure. . Arrival: You must be in the facility at least 30 minutes prior to your scheduled procedure. . Children: Do not bring any children with you. . Dress  appropriately: Bring dark clothing that you would not mind if they get stained. . Valuables: Do not bring any jewelry or valuables.  Reasons to call and reschedule or cancel your procedure: (Following these recommendations will minimize the risk of a serious complication.) . Surgeries: Avoid having procedures within 2 weeks of any surgery. (Avoid for 2 weeks before or after any surgery). . Flu Shots: Avoid having procedures within 2 weeks of a flu shots or . (Avoid for 2 weeks before or after immunizations). . Barium: Avoid having a procedure within 7-10 days after having had a radiological study involving the use of radiological contrast. (Myelograms, Barium swallow or enema study). . Heart attacks: Avoid any elective procedures or surgeries for the initial 6 months after a "Myocardial Infarction" (Heart Attack). . Blood thinners: It is imperative that you stop these medications before procedures. Let us know if you if you take any blood thinner.  . Infection: Avoid procedures during or within two weeks of an infection (including chest colds or gastrointestinal problems). Symptoms associated with infections include: Localized redness, fever, chills, night sweats or profuse sweating, burning sensation when voiding, cough, congestion, stuffiness, runny nose, sore throat, diarrhea, nausea, vomiting, cold or Flu symptoms, recent or current infections. It is specially important if the infection is over the area that we intend to treat. . Heart and lung problems: Symptoms that may suggest an active cardiopulmonary problem include: cough, chest pain, breathing difficulties or shortness of breath, dizziness, ankle swelling, uncontrolled high or unusually low blood pressure, and/or palpitations. If you are experiencing any of these symptoms, cancel your procedure and contact your primary care physician for an evaluation.  Remember:  Regular   Business hours are:  Monday to Thursday 8:00 AM to 4:00  PM  Provider's Schedule: Milinda Pointer, MD:  Procedure days: Tuesday and Thursday 7:30 AM to 4:00 PM  Gillis Santa, MD:  Procedure days: Monday and Wednesday 7:30 AM to 4:00 PM ____________________________________________________________________________________________   ____________________________________________________________________________________________  Blood Thinners  IMPORTANT NOTICE:  If you take any of these, make sure to notify the nursing staff.  Failure to do so may result in injury.  Recommended time intervals to stop and restart blood-thinners, before & after invasive procedures  Generic Name Brand Name Stop Time. Must be stopped at least this long before procedures. After procedures, wait at least this long before re-starting.  Abciximab Reopro 15 days 2 hrs  Alteplase Activase 10 days 10 days  Anagrelide Agrylin    Apixaban Eliquis 3 days 6 hrs  Cilostazol Pletal 3 days 5 hrs  Clopidogrel Plavix 7-10 days 2 hrs  Dabigatran Pradaxa 5 days 6 hrs  Dalteparin Fragmin 24 hours 4 hrs  Dipyridamole Aggrenox 11days 2 hrs  Edoxaban Lixiana; Savaysa 3 days 2 hrs  Enoxaparin  Lovenox 24 hours 4 hrs  Eptifibatide Integrillin 8 hours 2 hrs  Fondaparinux  Arixtra 72 hours 12 hrs  Hydroxychloroquine Plaquenil 11 days   Prasugrel Effient 7-10 days 6 hrs  Reteplase Retavase 10 days 10 days  Rivaroxaban Xarelto 3 days 6 hrs  Ticagrelor Brilinta 5-7 days 6 hrs  Ticlopidine Ticlid 10-14 days 2 hrs  Tinzaparin Innohep 24 hours 4 hrs  Tirofiban Aggrastat 8 hours 2 hrs  Warfarin Coumadin 5 days 2 hrs   Other medications with blood-thinning effects  Product indications Generic (Brand) names Note  Cholesterol Lipitor Stop 4 days before procedure  Blood thinner (injectable) Heparin (LMW or LMWH Heparin) Stop 24 hours before procedure  Cancer Ibrutinib (Imbruvica) Stop 7 days before procedure  Malaria/Rheumatoid Hydroxychloroquine (Plaquenil) Stop 11 days before  procedure  Thrombolytics  10 days before or after procedures   Over-the-counter (OTC) Products with blood-thinning effects  Product Common names Stop Time  Aspirin > 325 mg Goody Powders, Excedrin, etc. 11 days  Aspirin ? 81 mg  7 days  Fish oil  4 days  Garlic supplements  7 days  Ginkgo biloba  36 hours  Ginseng  24 hours  NSAIDs Ibuprofen, Naprosyn, etc. 3 days  Vitamin E  4 days   ____________________________________________________________________________________________

## 2020-04-19 ENCOUNTER — Ambulatory Visit
Admission: RE | Admit: 2020-04-19 | Discharge: 2020-04-19 | Disposition: A | Discharge status: Home / Self Care | Payer: PPO | Source: Ambulatory Visit | Attending: Pain Medicine | Admitting: Pain Medicine

## 2020-04-19 ENCOUNTER — Ambulatory Visit (HOSPITAL_BASED_OUTPATIENT_CLINIC_OR_DEPARTMENT_OTHER): Payer: PPO | Admitting: Pain Medicine

## 2020-04-19 ENCOUNTER — Other Ambulatory Visit: Payer: Self-pay

## 2020-04-19 ENCOUNTER — Encounter: Payer: Self-pay | Admitting: Pain Medicine

## 2020-04-19 VITALS — BP 116/88 | HR 85 | Temp 96.9°F | Resp 17 | Ht 59.0 in | Wt 92.0 lb

## 2020-04-19 DIAGNOSIS — S22080S Wedge compression fracture of T11-T12 vertebra, sequela: Secondary | ICD-10-CM

## 2020-04-19 DIAGNOSIS — M546 Pain in thoracic spine: Secondary | ICD-10-CM | POA: Diagnosis not present

## 2020-04-19 DIAGNOSIS — M5135 Other intervertebral disc degeneration, thoracolumbar region: Secondary | ICD-10-CM | POA: Insufficient documentation

## 2020-04-19 DIAGNOSIS — G8929 Other chronic pain: Secondary | ICD-10-CM

## 2020-04-19 DIAGNOSIS — S32010S Wedge compression fracture of first lumbar vertebra, sequela: Secondary | ICD-10-CM | POA: Insufficient documentation

## 2020-04-19 DIAGNOSIS — Z7901 Long term (current) use of anticoagulants: Secondary | ICD-10-CM

## 2020-04-19 DIAGNOSIS — M545 Low back pain, unspecified: Secondary | ICD-10-CM | POA: Diagnosis not present

## 2020-04-19 MED ORDER — SODIUM CHLORIDE (PF) 0.9 % IJ SOLN
INTRAMUSCULAR | Status: AC
  Filled 2020-04-19: qty 10

## 2020-04-19 MED ORDER — ROPIVACAINE HCL 2 MG/ML IJ SOLN
2.0000 mL | Freq: Once | INTRAMUSCULAR | Status: AC
  Administered 2020-04-19: 2 mL via EPIDURAL
  Filled 2020-04-19: qty 10

## 2020-04-19 MED ORDER — LIDOCAINE HCL 2 % IJ SOLN
20.0000 mL | Freq: Once | INTRAMUSCULAR | Status: AC
  Administered 2020-04-19: 400 mg
  Filled 2020-04-19: qty 40

## 2020-04-19 MED ORDER — TRIAMCINOLONE ACETONIDE 40 MG/ML IJ SUSP
40.0000 mg | Freq: Once | INTRAMUSCULAR | Status: AC
Start: 2020-04-19 — End: 2020-04-19
  Administered 2020-04-19: 40 mg
  Filled 2020-04-19: qty 1

## 2020-04-19 MED ORDER — IOHEXOL 180 MG/ML  SOLN
10.0000 mL | Freq: Once | INTRAMUSCULAR | Status: AC
Start: 2020-04-19 — End: 2020-04-19
  Administered 2020-04-19: 10 mL via EPIDURAL
  Filled 2020-04-19: qty 20

## 2020-04-19 MED ORDER — SODIUM CHLORIDE 0.9% FLUSH
2.0000 mL | Freq: Once | INTRAVENOUS | Status: AC
  Administered 2020-04-19: 2 mL

## 2020-04-19 NOTE — Progress Notes (Signed)
PROVIDER NOTE: Information contained herein reflects review and annotations entered in association with encounter. Interpretation of such information and data should be left to medically-trained personnel. Information provided to patient can be located elsewhere in the medical record under "Patient Instructions". Document created using STT-dictation technology, any transcriptional errors that may result from process are unintentional.    Patient: Elizabeth Fields  Service Category: Procedure  Provider: Gaspar Cola, MD  DOB: [birth year]  DOS: 04/19/2020  Location: [facility]  MRN: [id number]  Setting: Ambulatory - outpatient  Referring Provider: Baxter Hire, MD  Type: Established Patient  Specialty: Interventional Pain Management  PCP: Baxter Hire, MD   Primary Reason for Visit: Interventional Pain Management Treatment. CC: Back Pain (low)  Procedure:          Anesthesia, Analgesia, Anxiolysis:  Type: Therapeutic Inter-Laminar Epidural Steroid Injection  #1  Region: Lumbar Level: T12-L1 Level. Laterality: Left Paramedial  Type: Local Anesthesia Indication(s): Analgesia         Route: Infiltration ([place]/IM) IV Access: Declined Sedation: Declined  Local Anesthetic: Lidocaine 1-2%  Position: Prone with head of the table was raised to facilitate breathing.   Indications: 1. Chronic midline thoracic back pain   2. Chronic low back pain (1ry area of Pain) (Bilateral) w/o sciatica   3. Compression fracture of T12 vertebra, sequela   4. Compression fracture of L1 vertebra, sequela    5. DDD (degenerative disc disease), thoracolumbar   6. Chronic anticoagulation (Coumadin)    Pain Score: Pre-procedure: 3 /10 Post-procedure: 0-No pain/10   Pre-op H&P Assessment:  Elizabeth Fields is a 87 y.o. (year old), female patient, seen today for interventional treatment. She  has a past surgical history that includes Cholecystectomy; Hip surgery; and Anterior approach hemi hip  arthroplasty (Left, 02/12/2019). Elizabeth Fields has a current medication list which includes the following prescription(s): acetaminophen, aspirin ec, cholecalciferol, doxepin, esomeprazole, feeding supplement, ferrous [id number] c-folic acid, furosemide, levothyroxine, magnesium oxide, metoprolol succinate, simvastatin, tramadol, and warfarin. Her primarily concern today is the Back Pain (low)  Initial Vital Signs:  Pulse/HCG Rate: 85ECG Heart Rate: 86 Temp: (!) 96.9 F (36.1 C) Resp: 16 BP: 137/66 SpO2: 98 %  BMI: Estimated body mass index is 18.58 kg/m as calculated from the following:   Height as of this encounter: 4\' 11"  (1.499 m).   Weight as of this encounter: 92 lb (41.7 kg).  Risk Assessment: Allergies: Reviewed. She is allergic to fosamax [alendronate sodium], other, and penicillins.  Allergy Precautions: None required Coagulopathies: Reviewed. None identified.  Blood-thinner therapy: None at this time Active Infection(s): Reviewed. None identified. Elizabeth Fields is afebrile  Site Confirmation: Elizabeth Fields was asked to confirm the procedure and laterality before marking the site Procedure checklist: Completed Consent: Before the procedure and under the influence of no sedative(s), amnesic(s), or anxiolytics, the patient was informed of the treatment options, risks and possible complications. To fulfill our ethical and legal obligations, as recommended by the American Medical Association's Code of Ethics, I have informed the patient of my clinical impression; the nature and purpose of the treatment or procedure; the risks, benefits, and possible complications of the intervention; the alternatives, including doing nothing; the risk(s) and benefit(s) of the alternative treatment(s) or procedure(s); and the risk(s) and benefit(s) of doing nothing. The patient was provided information about the general risks and possible complications associated with the procedure. These may include, but are not  limited to: failure to achieve desired goals, infection, bleeding, organ or  nerve damage, allergic reactions, paralysis, and death. In addition, the patient was informed of those risks and complications associated to Spine-related procedures, such as failure to decrease pain; infection (i.e.: Meningitis, epidural or intraspinal abscess); bleeding (i.e.: epidural hematoma, subarachnoid hemorrhage, or any other type of intraspinal or peri-dural bleeding); organ or nerve damage (i.e.: Any type of peripheral nerve, nerve root, or spinal cord injury) with subsequent damage to sensory, motor, and/or autonomic systems, resulting in permanent pain, numbness, and/or weakness of one or several areas of the body; allergic reactions; (i.e.: anaphylactic reaction); and/or death. Furthermore, the patient was informed of those risks and complications associated with the medications. These include, but are not limited to: allergic reactions (i.e.: anaphylactic or anaphylactoid reaction(s)); adrenal axis suppression; blood sugar elevation that in diabetics may result in ketoacidosis or comma; water retention that in patients with history of congestive heart failure may result in shortness of breath, pulmonary edema, and decompensation with resultant heart failure; weight gain; swelling or edema; medication-induced neural toxicity; particulate matter embolism and blood vessel occlusion with resultant organ, and/or nervous system infarction; and/or aseptic necrosis of one or more joints. Finally, the patient was informed that Medicine is not an exact science; therefore, there is also the possibility of unforeseen or unpredictable risks and/or possible complications that may result in a catastrophic outcome. The patient indicated having understood very clearly. We have given the patient no guarantees and we have made no promises. Enough time was given to the patient to ask questions, all of which were answered to the patient's  satisfaction. Elizabeth Fields has indicated that she wanted to continue with the procedure. Attestation: I, the ordering provider, attest that I have discussed with the patient the benefits, risks, side-effects, alternatives, likelihood of achieving goals, and potential problems during recovery for the procedure that I have provided informed consent. Date  Time: 04/19/2020 11:14 AM  Pre-Procedure Preparation:  Monitoring: As per clinic protocol. Respiration, ETCO2, SpO2, BP, heart rate and rhythm monitor placed and checked for adequate function Safety Precautions: Patient was assessed for positional comfort and pressure points before starting the procedure. Time-out: I initiated and conducted the "Time-out" before starting the procedure, as per protocol. The patient was asked to participate by confirming the accuracy of the "Time Out" information. Verification of the correct person, site, and procedure were performed and confirmed by me, the nursing staff, and the patient. "Time-out" conducted as per Joint Commission's Universal Protocol (UP.01.01.01). Time: 1209  Description of Procedure:          Target Area: The interlaminar space, initially targeting the lower laminar border of the superior vertebral body. Approach: Paramedial approach. Area Prepped: Entire Posterior Lumbar Region DuraPrep (Iodine Povacrylex [0.7% available iodine] and Isopropyl Alcohol, 74% w/w) Safety Precautions: Aspiration looking for blood return was conducted prior to all injections. At no point did we inject any substances, as a needle was being advanced. No attempts were made at seeking any paresthesias. Safe injection practices and needle disposal techniques used. Medications properly checked for expiration dates. SDV (single dose vial) medications used. Description of the Procedure: Protocol guidelines were followed. The procedure needle was introduced through the skin, ipsilateral to the reported pain, and advanced to the  target area. Bone was contacted and the needle walked caudad, until the lamina was cleared. The epidural space was identified using "loss-of-resistance technique" with 2-3 ml of PF-NaCl (0.9% NSS), in a 5cc LOR glass syringe.  Vitals:   04/19/20 1205 04/19/20 1210 04/19/20 1215 04/19/20 1216  BP: (!) 153/80 (!) 152/73 (!) 154/83 116/88  Pulse:      Resp: 18 16 19 17   Temp:      SpO2: 98% 100% 98% 96%  Weight:      Height:        Start Time: 1209 hrs. End Time: 1216 hrs.  Materials:  Needle(s) Type: Epidural needle Gauge: 17G Length: 3.5-in Medication(s): Please see orders for medications and dosing details.  Imaging Guidance (Spinal):          Type of Imaging Technique: Fluoroscopy Guidance (Spinal) Indication(s): Assistance in needle guidance and placement for procedures requiring needle placement in or near specific anatomical locations not easily accessible without such assistance. Exposure Time: Please see nurses notes. Contrast: Before injecting any contrast, we confirmed that the patient did not have an allergy to iodine, shellfish, or radiological contrast. Once satisfactory needle placement was completed at the desired level, radiological contrast was injected. Contrast injected under live fluoroscopy. No contrast complications. See chart for type and volume of contrast used. Fluoroscopic Guidance: I was personally present during the use of fluoroscopy. "Tunnel Vision Technique" used to obtain the best possible view of the target area. Parallax error corrected before commencing the procedure. "Direction-depth-direction" technique used to introduce the needle under continuous pulsed fluoroscopy. Once target was reached, antero-posterior, oblique, and lateral fluoroscopic projection used confirm needle placement in all planes. Images permanently stored in EMR. Interpretation: I personally interpreted the imaging intraoperatively. Adequate needle placement confirmed in multiple  planes. Appropriate spread of contrast into desired area was observed. No evidence of afferent or efferent intravascular uptake. No intrathecal or subarachnoid spread observed. Permanent images saved into the patient's record.  Antibiotic Prophylaxis:   Anti-infectives (From admission, onward)   None     Indication(s): None identified  Post-operative Assessment:  Post-procedure Vital Signs:  Pulse/HCG Rate: 85(!) 107 Temp: (!) 96.9 F (36.1 C) Resp: 17 BP: 116/88 SpO2: 96 %  EBL: None  Complications: No immediate post-treatment complications observed by team, or reported by patient.  Note: The patient tolerated the entire procedure well. A repeat set of vitals were taken after the procedure and the patient was kept under observation following institutional policy, for this type of procedure. Post-procedural neurological assessment was performed, showing return to baseline, prior to discharge. The patient was provided with post-procedure discharge instructions, including a section on how to identify potential problems. Should any problems arise concerning this procedure, the patient was given instructions to immediately contact us, at any time, without hesitation. In any case, we plan to contact the patient by telephone for a follow-up status report regarding this interventional procedure.  Comments:  No additional relevant information.  Plan of Care  Orders:  Orders Placed This Encounter  Procedures  . Lumbar Epidural Injection    Scheduling Instructions:     Procedure: Interlaminar LESI T12-L1     Laterality: Left-sided     Sedation: No Sedation     Timeframe:  Today    Order Specific Question:   Where will this procedure be performed?    Answer:   [facility]  . [facility] C-ARM 1-60 MIN NO REPORT    Intraoperative interpretation by procedural physician at [facility].    Standing Status:   Standing    Number of Occurrences:   1    Order Specific  Question:   Reason for exam:    Answer:   Assistance in needle guidance and placement for procedures requiring needle placement  in or near specific anatomical locations not easily accessible without such assistance.  . Informed Consent Details: Physician/Practitioner Attestation; Transcribe to consent form and obtain patient signature    Note: Always confirm laterality of pain with Ms. Hieronymus, before procedure. Transcribe to consent form and obtain patient signature.    Order Specific Question:   Physician/Practitioner attestation of informed consent for procedure/surgical case    Answer:   I, the physician/practitioner, attest that I have discussed with the patient the benefits, risks, side effects, alternatives, likelihood of achieving goals and potential problems during recovery for the procedure that I have provided informed consent.    Order Specific Question:   Procedure    Answer:   Lumbar epidural steroid injection under fluoroscopic guidance    Order Specific Question:   Physician/Practitioner performing the procedure    Answer:   Jennier Schissler A. Dossie Arbour, MD    Order Specific Question:   Indication/Reason    Answer:   Low back and/or lower extremity pain secondary to lumbar radiculitis  . Care order/instruction: Please confirm that the patient has stopped the Coumadin (Warfarin) X 5 days prior to procedure or surgery.    Please confirm that the patient has stopped the Coumadin (Warfarin) X 5 days prior to procedure or surgery.    Standing Status:   Standing    Number of Occurrences:   1  . Provide equipment / supplies at bedside    "Epidural Tray" (Disposable  single use) Catheter: NOT required    Standing Status:   Standing    Number of Occurrences:   1    Order Specific Question:   Specify    Answer:   Epidural Tray  . Bleeding precautions    Standing Status:   Standing    Number of Occurrences:   1   Chronic Opioid Analgesic:  No opioid analgesics prescribed by our practice.   MME/day: 0 mg/day   Medications ordered for procedure: Meds ordered this encounter  Medications  . iohexol (OMNIPAQUE) 180 MG/ML injection 10 mL    Must be Myelogram-compatible. If not available, you may substitute with a water-soluble, non-ionic, hypoallergenic, myelogram-compatible radiological contrast medium.  Marland Kitchen lidocaine (XYLOCAINE) 2 % (with pres) injection 400 mg  . sodium chloride flush (NS) 0.9 % injection 2 mL  . ropivacaine (PF) 2 mg/mL (0.2%) (NAROPIN) injection 2 mL  . triamcinolone acetonide (KENALOG-40) injection 40 mg   Medications administered: We administered iohexol, lidocaine, sodium chloride flush, ropivacaine (PF) 2 mg/mL (0.2%), and triamcinolone acetonide.  See the medical record for exact dosing, route, and time of administration.  Follow-up plan:   Return in about 2 weeks (around 05/03/2020) for on afternoon of procedure day, (F2F), (PPE).       Interventional Therapies  Risk  Complexity Considerations:   NOTE: Coumadin anticoagulation (Stop: 5 days  Restart: 2 hrs)   Planned  Pending:   Diagnostic left T12-L1 LESI #1  Therapeutic bilateral Lumbar Facet RFA (starting with left side)   Under consideration:   Diagnostic/therapeutic Lumbar/thoracic ESIs.   Completed:   Diagnostic bilateral lumbar facet MBB (L2, L3, L4, L5, and S1) x2 (03/08/2020) (100/100/80/80)  Diagnostic bilateral thoracolumbar facet MBB (T10, T11, T12, L1, L2) x1 (03/29/2020) (0/0/10/0)    Therapeutic  Palliative (PRN) options:   Diagnostic bilateral lumbar facet block #2      Recent Visits Date Type Provider Dept  04/14/20 Office Visit Milinda Pointer, MD [facility]  03/29/20 Procedure visit Milinda Pointer, MD [facility]  03/22/20 Office Visit Milinda Pointer, MD [facility]  03/08/20 Procedure visit Milinda Pointer, MD [facility]  03/02/20 Office Visit Milinda Pointer, MD [facility]  02/03/20 Office Visit  Milinda Pointer, MD [facility]  Showing recent visits within past 90 days and meeting all other requirements Today's Visits Date Type Provider Dept  04/19/20 Procedure visit Milinda Pointer, MD [facility]  Showing today's visits and meeting all other requirements Future Appointments Date Type Provider Dept  05/10/20 Appointment Milinda Pointer, MD [facility]  Showing future appointments within next 90 days and meeting all other requirements  Disposition: Discharge home  Discharge (Date  Time): 04/19/2020; 1226 hrs.   Primary Care Physician: Baxter Hire, MD Location: [facility] Note by: Gaspar Cola, MD Date: 04/19/2020; Time: 12:38 PM  Disclaimer:  Medicine is not an Chief Strategy Officer. The only guarantee in medicine is that nothing is guaranteed. It is important to note that the decision to proceed with this intervention was based on the information collected from the patient. The Data and conclusions were drawn from the patient's questionnaire, the interview, and the physical examination. Because the information was provided in large part by the patient, it cannot be guaranteed that it has not been purposely or unconsciously manipulated. Every effort has been made to obtain as much relevant data as possible for this evaluation. It is important to note that the conclusions that lead to this procedure are derived in large part from the available data. Always take into account that the treatment will also be dependent on availability of resources and existing treatment guidelines, considered by other Pain Management Practitioners as being common knowledge and practice, at the time of the intervention. For Medico-Legal purposes, it is also important to point out that variation in procedural techniques and pharmacological choices are the acceptable norm. The indications, contraindications, technique, and results of the  above procedure should only be interpreted and judged by a Board-Certified Interventional Pain Specialist with extensive familiarity and expertise in the same exact procedure and technique.

## 2020-04-19 NOTE — Progress Notes (Signed)
Safety precautions to be maintained throughout the outpatient stay will include: orient to surroundings, keep bed in low position, maintain call bell within reach at all times, provide assistance with transfer out of bed and ambulation.  

## 2020-04-19 NOTE — Patient Instructions (Signed)

## 2020-04-20 ENCOUNTER — Telehealth: Payer: Self-pay

## 2020-04-20 NOTE — Telephone Encounter (Signed)
Post procedure phone call.  Patient states she is doing real good.

## 2020-05-05 DIAGNOSIS — I480 Paroxysmal atrial fibrillation: Secondary | ICD-10-CM | POA: Diagnosis not present

## 2020-05-05 DIAGNOSIS — E538 Deficiency of other specified B group vitamins: Secondary | ICD-10-CM | POA: Diagnosis not present

## 2020-05-10 ENCOUNTER — Ambulatory Visit: Payer: PPO | Admitting: Pain Medicine

## 2020-05-18 ENCOUNTER — Other Ambulatory Visit: Payer: Self-pay | Admitting: Internal Medicine

## 2020-05-18 DIAGNOSIS — I480 Paroxysmal atrial fibrillation: Secondary | ICD-10-CM | POA: Diagnosis not present

## 2020-05-18 DIAGNOSIS — Z1231 Encounter for screening mammogram for malignant neoplasm of breast: Secondary | ICD-10-CM

## 2020-05-25 ENCOUNTER — Ambulatory Visit
Admission: RE | Admit: 2020-05-25 | Discharge: 2020-05-25 | Disposition: A | Discharge status: Home / Self Care | Payer: PPO | Source: Ambulatory Visit | Attending: Internal Medicine | Admitting: Internal Medicine

## 2020-05-25 ENCOUNTER — Other Ambulatory Visit: Payer: Self-pay

## 2020-05-25 DIAGNOSIS — Z1231 Encounter for screening mammogram for malignant neoplasm of breast: Secondary | ICD-10-CM | POA: Insufficient documentation

## 2020-06-09 DIAGNOSIS — E538 Deficiency of other specified B group vitamins: Secondary | ICD-10-CM | POA: Diagnosis not present

## 2020-06-09 DIAGNOSIS — I480 Paroxysmal atrial fibrillation: Secondary | ICD-10-CM | POA: Diagnosis not present

## 2020-06-29 DIAGNOSIS — Z9889 Other specified postprocedural states: Secondary | ICD-10-CM | POA: Diagnosis not present

## 2020-06-29 DIAGNOSIS — I341 Nonrheumatic mitral (valve) prolapse: Secondary | ICD-10-CM | POA: Diagnosis not present

## 2020-06-29 DIAGNOSIS — I480 Paroxysmal atrial fibrillation: Secondary | ICD-10-CM | POA: Diagnosis not present

## 2020-06-29 DIAGNOSIS — I493 Ventricular premature depolarization: Secondary | ICD-10-CM | POA: Diagnosis not present

## 2020-06-29 DIAGNOSIS — I361 Nonrheumatic tricuspid (valve) insufficiency: Secondary | ICD-10-CM | POA: Diagnosis not present

## 2020-06-29 DIAGNOSIS — I2510 Atherosclerotic heart disease of native coronary artery without angina pectoris: Secondary | ICD-10-CM | POA: Diagnosis not present

## 2020-06-29 DIAGNOSIS — I340 Nonrheumatic mitral (valve) insufficiency: Secondary | ICD-10-CM | POA: Diagnosis not present

## 2020-06-29 DIAGNOSIS — E785 Hyperlipidemia, unspecified: Secondary | ICD-10-CM | POA: Diagnosis not present

## 2020-07-04 ENCOUNTER — Ambulatory Visit: Payer: PPO | Admitting: Pain Medicine

## 2020-07-12 DIAGNOSIS — I480 Paroxysmal atrial fibrillation: Secondary | ICD-10-CM | POA: Diagnosis not present

## 2020-07-12 DIAGNOSIS — E538 Deficiency of other specified B group vitamins: Secondary | ICD-10-CM | POA: Diagnosis not present

## 2020-07-12 DIAGNOSIS — E559 Vitamin D deficiency, unspecified: Secondary | ICD-10-CM | POA: Diagnosis not present

## 2020-07-12 DIAGNOSIS — M810 Age-related osteoporosis without current pathological fracture: Secondary | ICD-10-CM | POA: Diagnosis not present

## 2020-07-14 DIAGNOSIS — M810 Age-related osteoporosis without current pathological fracture: Secondary | ICD-10-CM | POA: Diagnosis not present

## 2020-07-14 DIAGNOSIS — E559 Vitamin D deficiency, unspecified: Secondary | ICD-10-CM | POA: Diagnosis not present

## 2020-07-19 ENCOUNTER — Ambulatory Visit: Payer: PPO | Admitting: Pain Medicine

## 2020-07-28 DIAGNOSIS — M810 Age-related osteoporosis without current pathological fracture: Secondary | ICD-10-CM | POA: Diagnosis not present

## 2020-08-03 DIAGNOSIS — E785 Hyperlipidemia, unspecified: Secondary | ICD-10-CM | POA: Diagnosis not present

## 2020-08-03 DIAGNOSIS — E034 Atrophy of thyroid (acquired): Secondary | ICD-10-CM | POA: Diagnosis not present

## 2020-08-10 DIAGNOSIS — M810 Age-related osteoporosis without current pathological fracture: Secondary | ICD-10-CM | POA: Diagnosis not present

## 2020-08-10 DIAGNOSIS — I480 Paroxysmal atrial fibrillation: Secondary | ICD-10-CM | POA: Diagnosis not present

## 2020-08-10 DIAGNOSIS — E785 Hyperlipidemia, unspecified: Secondary | ICD-10-CM | POA: Diagnosis not present

## 2020-08-10 DIAGNOSIS — E034 Atrophy of thyroid (acquired): Secondary | ICD-10-CM | POA: Diagnosis not present

## 2020-08-10 DIAGNOSIS — D519 Vitamin B12 deficiency anemia, unspecified: Secondary | ICD-10-CM | POA: Diagnosis not present

## 2020-08-10 DIAGNOSIS — N1832 Chronic kidney disease, stage 3b: Secondary | ICD-10-CM | POA: Diagnosis not present

## 2020-08-10 DIAGNOSIS — I2510 Atherosclerotic heart disease of native coronary artery without angina pectoris: Secondary | ICD-10-CM | POA: Diagnosis not present

## 2020-08-15 DIAGNOSIS — E538 Deficiency of other specified B group vitamins: Secondary | ICD-10-CM | POA: Diagnosis not present

## 2020-09-15 DIAGNOSIS — I480 Paroxysmal atrial fibrillation: Secondary | ICD-10-CM | POA: Diagnosis not present

## 2020-09-15 DIAGNOSIS — E538 Deficiency of other specified B group vitamins: Secondary | ICD-10-CM | POA: Diagnosis not present

## 2020-10-17 DIAGNOSIS — E538 Deficiency of other specified B group vitamins: Secondary | ICD-10-CM | POA: Diagnosis not present

## 2020-10-17 DIAGNOSIS — I480 Paroxysmal atrial fibrillation: Secondary | ICD-10-CM | POA: Diagnosis not present

## 2020-10-17 DIAGNOSIS — Z23 Encounter for immunization: Secondary | ICD-10-CM | POA: Diagnosis not present

## 2020-10-25 DIAGNOSIS — I361 Nonrheumatic tricuspid (valve) insufficiency: Secondary | ICD-10-CM | POA: Diagnosis not present

## 2020-10-25 DIAGNOSIS — I493 Ventricular premature depolarization: Secondary | ICD-10-CM | POA: Diagnosis not present

## 2020-10-25 DIAGNOSIS — I340 Nonrheumatic mitral (valve) insufficiency: Secondary | ICD-10-CM | POA: Diagnosis not present

## 2020-10-25 DIAGNOSIS — I351 Nonrheumatic aortic (valve) insufficiency: Secondary | ICD-10-CM | POA: Diagnosis not present

## 2020-10-25 DIAGNOSIS — I341 Nonrheumatic mitral (valve) prolapse: Secondary | ICD-10-CM | POA: Diagnosis not present

## 2020-10-25 DIAGNOSIS — I2510 Atherosclerotic heart disease of native coronary artery without angina pectoris: Secondary | ICD-10-CM | POA: Diagnosis not present

## 2020-10-25 DIAGNOSIS — I480 Paroxysmal atrial fibrillation: Secondary | ICD-10-CM | POA: Diagnosis not present

## 2020-10-25 DIAGNOSIS — E785 Hyperlipidemia, unspecified: Secondary | ICD-10-CM | POA: Diagnosis not present

## 2020-10-25 DIAGNOSIS — N1832 Chronic kidney disease, stage 3b: Secondary | ICD-10-CM | POA: Diagnosis not present

## 2020-10-25 DIAGNOSIS — Z9889 Other specified postprocedural states: Secondary | ICD-10-CM | POA: Diagnosis not present

## 2020-11-03 ENCOUNTER — Ambulatory Visit: Payer: PPO | Admitting: Dermatology

## 2020-11-03 ENCOUNTER — Other Ambulatory Visit: Payer: Self-pay

## 2020-11-03 DIAGNOSIS — D1800 Hemangioma unspecified site: Secondary | ICD-10-CM

## 2020-11-03 DIAGNOSIS — Z85828 Personal history of other malignant neoplasm of skin: Secondary | ICD-10-CM | POA: Diagnosis not present

## 2020-11-03 DIAGNOSIS — L821 Other seborrheic keratosis: Secondary | ICD-10-CM | POA: Diagnosis not present

## 2020-11-03 DIAGNOSIS — L578 Other skin changes due to chronic exposure to nonionizing radiation: Secondary | ICD-10-CM

## 2020-11-03 DIAGNOSIS — L570 Actinic keratosis: Secondary | ICD-10-CM | POA: Diagnosis not present

## 2020-11-03 DIAGNOSIS — D692 Other nonthrombocytopenic purpura: Secondary | ICD-10-CM

## 2020-11-03 DIAGNOSIS — D229 Melanocytic nevi, unspecified: Secondary | ICD-10-CM | POA: Diagnosis not present

## 2020-11-03 DIAGNOSIS — L820 Inflamed seborrheic keratosis: Secondary | ICD-10-CM | POA: Diagnosis not present

## 2020-11-03 DIAGNOSIS — I872 Venous insufficiency (chronic) (peripheral): Secondary | ICD-10-CM | POA: Diagnosis not present

## 2020-11-03 DIAGNOSIS — Z1283 Encounter for screening for malignant neoplasm of skin: Secondary | ICD-10-CM

## 2020-11-03 DIAGNOSIS — L814 Other melanin hyperpigmentation: Secondary | ICD-10-CM

## 2020-11-03 NOTE — Patient Instructions (Addendum)
Actinic keratoses are precancerous spots that appear secondary to cumulative UV radiation exposure/sun exposure over time. They are chronic with expected duration over 1 year. A portion of actinic keratoses will progress to squamous cell carcinoma of the skin. It is not possible to reliably predict which spots will progress to skin cancer and so treatment is recommended to prevent development of skin cancer.  Recommend daily broad spectrum sunscreen SPF 30+ to sun-exposed areas, reapply every 2 hours as needed.  Recommend staying in the shade or wearing long sleeves, sun glasses (UVA+UVB protection) and wide brim hats (4-inch brim around the entire circumference of the hat). Call for new or changing lesions.    Cryotherapy Aftercare  Wash gently with soap and water everyday.   Apply Vaseline and Band-Aid daily until healed.    Melanoma ABCDEs  Melanoma is the most dangerous type of skin cancer, and is the leading cause of death from skin disease.  You are more likely to develop melanoma if you: Have light-colored skin, light-colored eyes, or red or blond hair Spend a lot of time in the sun Tan regularly, either outdoors or in a tanning bed Have had blistering sunburns, especially during childhood Have a close family member who has had a melanoma Have atypical moles or large birthmarks  Early detection of melanoma is key since treatment is typically straightforward and cure rates are extremely high if we catch it early.   The first sign of melanoma is often a change in a mole or a new dark spot.  The ABCDE system is a way of remembering the signs of melanoma.  A for asymmetry:  The two halves do not match. B for border:  The edges of the growth are irregular. C for color:  A mixture of colors are present instead of an even brown color. D for diameter:  Melanomas are usually (but not always) greater than 36mm - the size of a pencil eraser. E for evolution:  The spot keeps changing in size,  shape, and color.  Please check your skin once per month between visits. You can use a small mirror in front and a large mirror behind you to keep an eye on the back side or your body.   If you see any new or changing lesions before your next follow-up, please call to schedule a visit.  Please continue daily skin protection including broad spectrum sunscreen SPF 30+ to sun-exposed areas, reapplying every 2 hours as needed when you're outdoors.   Staying in the shade or wearing long sleeves, sun glasses (UVA+UVB protection) and wide brim hats (4-inch brim around the entire circumference of the hat) are also recommended for sun protection.

## 2020-11-03 NOTE — Progress Notes (Signed)
Follow-Up Visit   Subjective  Elizabeth Fields is a 87 y.o. female who presents for the following: Follow-up (Patient here today for tbse. She reports no new concerns. ). Patient here for full body skin exam and skin cancer screening.  The following portions of the chart were reviewed this encounter and updated as appropriate:  Tobacco  Allergies  Meds  Problems  Med Hx  Surg Hx  Fam Hx     Review of Systems: No other skin or systemic complaints except as noted in HPI or Assessment and Plan.  Objective  Well appearing patient in no apparent distress; mood and affect are within normal limits.  A full examination was performed including scalp, head, eyes, ears, nose, lips, neck, chest, axillae, abdomen, back, buttocks, bilateral upper extremities, bilateral lower extremities, hands, feet, fingers, toes, fingernails, and toenails. All findings within normal limits unless otherwise noted below.  face x16 (16) Erythematous thin papules/macules with gritty scale.   face x 2, chest x 2 (4) Erythematous keratotic or waxy stuck-on papule or plaque.   Assessment & Plan  Venous stasis dermatitis of left lower extremity bilateral lower legs  Graduated compression stockings Stasis changes with shambergs purpura    The patient will observe these symptoms, and report promptly any worsening or unexpected persistence.  If well, may return prn.   Actinic keratosis (16) face x16  Actinic keratoses are precancerous spots that appear secondary to cumulative UV radiation exposure/sun exposure over time. They are chronic with expected duration over 1 year. A portion of actinic keratoses will progress to squamous cell carcinoma of the skin. It is not possible to reliably predict which spots will progress to skin cancer and so treatment is recommended to prevent development of skin cancer.  Recommend daily broad spectrum sunscreen SPF 30+ to sun-exposed areas, reapply every 2 hours as needed.   Recommend staying in the shade or wearing long sleeves, sun glasses (UVA+UVB protection) and wide brim hats (4-inch brim around the entire circumference of the hat). Call for new or changing lesions.  Destruction of lesion - face x16 Complexity: simple   Destruction method: cryotherapy   Informed consent: discussed and consent obtained   Timeout:  patient name, date of birth, surgical site, and procedure verified Lesion destroyed using liquid nitrogen: Yes   Region frozen until ice ball extended beyond lesion: Yes   Outcome: patient tolerated procedure well with no complications   Post-procedure details: wound care instructions given    Inflamed seborrheic keratosis face x 2, chest x 2  Destruction of lesion - face x 2, chest x 2  Destruction method: cryotherapy   Informed consent: discussed and consent obtained   Lesion destroyed using liquid nitrogen: Yes   Cryotherapy cycles:  2 Outcome: patient tolerated procedure well with no complications   Post-procedure details: wound care instructions given   Additional details:  Prior to procedure, discussed risks of blister formation, small wound, skin dyspigmentation, or rare scar following cryotherapy. Recommend Vaseline ointment to treated areas while healing.  Skin cancer screening  Lentigines - Scattered tan macules - Due to sun exposure - Benign-appearing, observe - Recommend daily broad spectrum sunscreen SPF 30+ to sun-exposed areas, reapply every 2 hours as needed. - Call for any changes  Seborrheic Keratoses - Stuck-on, waxy, tan-brown papules and/or plaques  - Benign-appearing - Discussed benign etiology and prognosis. - Observe - Call for any changes  Purpura - Chronic; persistent and recurrent.  Treatable, but not curable. - Violaceous macules  and patches - Benign - Related to trauma, age, sun damage and/or use of blood thinners, chronic use of topical and/or oral steroids - Observe - Can use OTC arnica  containing moisturizer such as Dermend Bruise Formula if desired - Call for worsening or other concerns  Melanocytic Nevi - Tan-brown and/or pink-flesh-colored symmetric macules and papules - Benign appearing on exam today - Observation - Call clinic for new or changing moles - Recommend daily use of broad spectrum spf 30+ sunscreen to sun-exposed areas.   Hemangiomas - Red papules - Discussed benign nature - Observe - Call for any changes  Actinic Damage - Chronic condition, secondary to cumulative UV/sun exposure - diffuse scaly erythematous macules with underlying dyspigmentation - Recommend daily broad spectrum sunscreen SPF 30+ to sun-exposed areas, reapply every 2 hours as needed.  - Staying in the shade or wearing long sleeves, sun glasses (UVA+UVB protection) and wide brim hats (4-inch brim around the entire circumference of the hat) are also recommended for sun protection.  - Call for new or changing lesions.  History of Squamous Cell Carcinoma of the Skin - No evidence of recurrence today left cheek  - No lymphadenopathy - Recommend regular full body skin exams - Recommend daily broad spectrum sunscreen SPF 30+ to sun-exposed areas, reapply every 2 hours as needed.  - Call if any new or changing lesions are noted between office visits  Skin cancer screening performed today.  Return for 1 year tbse . IRuthell Rummage, CMA, am acting as scribe for Sarina Ser, MD. Documentation: I have reviewed the above documentation for accuracy and completeness, and I agree with the above.  Sarina Ser, MD

## 2020-11-05 ENCOUNTER — Encounter: Payer: Self-pay | Admitting: Dermatology

## 2020-11-17 DIAGNOSIS — E538 Deficiency of other specified B group vitamins: Secondary | ICD-10-CM | POA: Diagnosis not present

## 2020-11-17 DIAGNOSIS — I480 Paroxysmal atrial fibrillation: Secondary | ICD-10-CM | POA: Diagnosis not present

## 2020-12-06 DIAGNOSIS — E034 Atrophy of thyroid (acquired): Secondary | ICD-10-CM | POA: Diagnosis not present

## 2020-12-12 DIAGNOSIS — H16223 Keratoconjunctivitis sicca, not specified as Sjogren's, bilateral: Secondary | ICD-10-CM | POA: Diagnosis not present

## 2020-12-13 DIAGNOSIS — I480 Paroxysmal atrial fibrillation: Secondary | ICD-10-CM | POA: Diagnosis not present

## 2020-12-13 DIAGNOSIS — N1832 Chronic kidney disease, stage 3b: Secondary | ICD-10-CM | POA: Diagnosis not present

## 2020-12-13 DIAGNOSIS — M810 Age-related osteoporosis without current pathological fracture: Secondary | ICD-10-CM | POA: Diagnosis not present

## 2020-12-13 DIAGNOSIS — E785 Hyperlipidemia, unspecified: Secondary | ICD-10-CM | POA: Diagnosis not present

## 2020-12-13 DIAGNOSIS — D519 Vitamin B12 deficiency anemia, unspecified: Secondary | ICD-10-CM | POA: Diagnosis not present

## 2020-12-13 DIAGNOSIS — K449 Diaphragmatic hernia without obstruction or gangrene: Secondary | ICD-10-CM | POA: Diagnosis not present

## 2020-12-13 DIAGNOSIS — I2510 Atherosclerotic heart disease of native coronary artery without angina pectoris: Secondary | ICD-10-CM | POA: Diagnosis not present

## 2020-12-13 DIAGNOSIS — E034 Atrophy of thyroid (acquired): Secondary | ICD-10-CM | POA: Diagnosis not present

## 2020-12-20 DIAGNOSIS — I480 Paroxysmal atrial fibrillation: Secondary | ICD-10-CM | POA: Diagnosis not present

## 2020-12-20 DIAGNOSIS — E538 Deficiency of other specified B group vitamins: Secondary | ICD-10-CM | POA: Diagnosis not present

## 2021-01-20 DIAGNOSIS — E538 Deficiency of other specified B group vitamins: Secondary | ICD-10-CM | POA: Diagnosis not present

## 2021-01-20 DIAGNOSIS — I480 Paroxysmal atrial fibrillation: Secondary | ICD-10-CM | POA: Diagnosis not present

## 2021-02-21 DIAGNOSIS — I2510 Atherosclerotic heart disease of native coronary artery without angina pectoris: Secondary | ICD-10-CM | POA: Diagnosis not present

## 2021-02-21 DIAGNOSIS — Z5181 Encounter for therapeutic drug level monitoring: Secondary | ICD-10-CM | POA: Diagnosis not present

## 2021-02-21 DIAGNOSIS — E538 Deficiency of other specified B group vitamins: Secondary | ICD-10-CM | POA: Diagnosis not present

## 2021-02-21 DIAGNOSIS — I340 Nonrheumatic mitral (valve) insufficiency: Secondary | ICD-10-CM | POA: Diagnosis not present

## 2021-02-21 DIAGNOSIS — E785 Hyperlipidemia, unspecified: Secondary | ICD-10-CM | POA: Diagnosis not present

## 2021-02-21 DIAGNOSIS — I361 Nonrheumatic tricuspid (valve) insufficiency: Secondary | ICD-10-CM | POA: Diagnosis not present

## 2021-02-21 DIAGNOSIS — I351 Nonrheumatic aortic (valve) insufficiency: Secondary | ICD-10-CM | POA: Diagnosis not present

## 2021-02-21 DIAGNOSIS — I480 Paroxysmal atrial fibrillation: Secondary | ICD-10-CM | POA: Diagnosis not present

## 2021-02-21 DIAGNOSIS — Z7901 Long term (current) use of anticoagulants: Secondary | ICD-10-CM | POA: Diagnosis not present

## 2021-03-24 DIAGNOSIS — I480 Paroxysmal atrial fibrillation: Secondary | ICD-10-CM | POA: Diagnosis not present

## 2021-03-24 DIAGNOSIS — Z7901 Long term (current) use of anticoagulants: Secondary | ICD-10-CM | POA: Diagnosis not present

## 2021-03-24 DIAGNOSIS — Z5181 Encounter for therapeutic drug level monitoring: Secondary | ICD-10-CM | POA: Diagnosis not present

## 2021-03-24 DIAGNOSIS — E538 Deficiency of other specified B group vitamins: Secondary | ICD-10-CM | POA: Diagnosis not present

## 2021-04-10 DIAGNOSIS — E034 Atrophy of thyroid (acquired): Secondary | ICD-10-CM | POA: Diagnosis not present

## 2021-04-17 DIAGNOSIS — I480 Paroxysmal atrial fibrillation: Secondary | ICD-10-CM | POA: Diagnosis not present

## 2021-04-17 DIAGNOSIS — M810 Age-related osteoporosis without current pathological fracture: Secondary | ICD-10-CM | POA: Diagnosis not present

## 2021-04-17 DIAGNOSIS — E785 Hyperlipidemia, unspecified: Secondary | ICD-10-CM | POA: Diagnosis not present

## 2021-04-17 DIAGNOSIS — N1832 Chronic kidney disease, stage 3b: Secondary | ICD-10-CM | POA: Diagnosis not present

## 2021-04-17 DIAGNOSIS — I2510 Atherosclerotic heart disease of native coronary artery without angina pectoris: Secondary | ICD-10-CM | POA: Diagnosis not present

## 2021-04-17 DIAGNOSIS — D519 Vitamin B12 deficiency anemia, unspecified: Secondary | ICD-10-CM | POA: Diagnosis not present

## 2021-04-17 DIAGNOSIS — Z0000 Encounter for general adult medical examination without abnormal findings: Secondary | ICD-10-CM | POA: Diagnosis not present

## 2021-04-17 DIAGNOSIS — E034 Atrophy of thyroid (acquired): Secondary | ICD-10-CM | POA: Diagnosis not present

## 2021-04-25 DIAGNOSIS — Z7901 Long term (current) use of anticoagulants: Secondary | ICD-10-CM | POA: Diagnosis not present

## 2021-04-25 DIAGNOSIS — Z5181 Encounter for therapeutic drug level monitoring: Secondary | ICD-10-CM | POA: Diagnosis not present

## 2021-04-25 DIAGNOSIS — E538 Deficiency of other specified B group vitamins: Secondary | ICD-10-CM | POA: Diagnosis not present

## 2021-04-25 DIAGNOSIS — I480 Paroxysmal atrial fibrillation: Secondary | ICD-10-CM | POA: Diagnosis not present

## 2021-04-26 ENCOUNTER — Other Ambulatory Visit: Payer: Self-pay | Admitting: Internal Medicine

## 2021-04-26 DIAGNOSIS — Z1231 Encounter for screening mammogram for malignant neoplasm of breast: Secondary | ICD-10-CM

## 2021-05-18 DIAGNOSIS — H6123 Impacted cerumen, bilateral: Secondary | ICD-10-CM | POA: Diagnosis not present

## 2021-05-18 DIAGNOSIS — H903 Sensorineural hearing loss, bilateral: Secondary | ICD-10-CM | POA: Diagnosis not present

## 2021-05-29 ENCOUNTER — Ambulatory Visit
Admission: RE | Admit: 2021-05-29 | Discharge: 2021-05-29 | Disposition: A | Discharge status: Home / Self Care | Payer: PPO | Source: Ambulatory Visit | Attending: Internal Medicine | Admitting: Internal Medicine

## 2021-05-29 DIAGNOSIS — I480 Paroxysmal atrial fibrillation: Secondary | ICD-10-CM | POA: Diagnosis not present

## 2021-05-29 DIAGNOSIS — Z1231 Encounter for screening mammogram for malignant neoplasm of breast: Secondary | ICD-10-CM | POA: Insufficient documentation

## 2021-05-29 DIAGNOSIS — Z5181 Encounter for therapeutic drug level monitoring: Secondary | ICD-10-CM | POA: Diagnosis not present

## 2021-05-29 DIAGNOSIS — Z7901 Long term (current) use of anticoagulants: Secondary | ICD-10-CM | POA: Diagnosis not present

## 2021-05-29 DIAGNOSIS — E538 Deficiency of other specified B group vitamins: Secondary | ICD-10-CM | POA: Diagnosis not present

## 2021-06-19 DIAGNOSIS — E785 Hyperlipidemia, unspecified: Secondary | ICD-10-CM | POA: Diagnosis not present

## 2021-06-19 DIAGNOSIS — N1832 Chronic kidney disease, stage 3b: Secondary | ICD-10-CM | POA: Diagnosis not present

## 2021-06-19 DIAGNOSIS — I361 Nonrheumatic tricuspid (valve) insufficiency: Secondary | ICD-10-CM | POA: Diagnosis not present

## 2021-06-19 DIAGNOSIS — I2510 Atherosclerotic heart disease of native coronary artery without angina pectoris: Secondary | ICD-10-CM | POA: Diagnosis not present

## 2021-06-19 DIAGNOSIS — Z9889 Other specified postprocedural states: Secondary | ICD-10-CM | POA: Diagnosis not present

## 2021-06-19 DIAGNOSIS — I340 Nonrheumatic mitral (valve) insufficiency: Secondary | ICD-10-CM | POA: Diagnosis not present

## 2021-06-19 DIAGNOSIS — I493 Ventricular premature depolarization: Secondary | ICD-10-CM | POA: Diagnosis not present

## 2021-06-19 DIAGNOSIS — I341 Nonrheumatic mitral (valve) prolapse: Secondary | ICD-10-CM | POA: Diagnosis not present

## 2021-06-19 DIAGNOSIS — I480 Paroxysmal atrial fibrillation: Secondary | ICD-10-CM | POA: Diagnosis not present

## 2021-06-19 DIAGNOSIS — I351 Nonrheumatic aortic (valve) insufficiency: Secondary | ICD-10-CM | POA: Diagnosis not present

## 2021-06-29 DIAGNOSIS — E538 Deficiency of other specified B group vitamins: Secondary | ICD-10-CM | POA: Diagnosis not present

## 2021-06-29 DIAGNOSIS — Z7901 Long term (current) use of anticoagulants: Secondary | ICD-10-CM | POA: Diagnosis not present

## 2021-06-29 DIAGNOSIS — Z5181 Encounter for therapeutic drug level monitoring: Secondary | ICD-10-CM | POA: Diagnosis not present

## 2021-06-29 DIAGNOSIS — I480 Paroxysmal atrial fibrillation: Secondary | ICD-10-CM | POA: Diagnosis not present

## 2021-07-24 DIAGNOSIS — E034 Atrophy of thyroid (acquired): Secondary | ICD-10-CM | POA: Diagnosis not present

## 2021-07-24 DIAGNOSIS — Z5181 Encounter for therapeutic drug level monitoring: Secondary | ICD-10-CM | POA: Diagnosis not present

## 2021-07-24 DIAGNOSIS — I480 Paroxysmal atrial fibrillation: Secondary | ICD-10-CM | POA: Diagnosis not present

## 2021-07-24 DIAGNOSIS — Z7901 Long term (current) use of anticoagulants: Secondary | ICD-10-CM | POA: Diagnosis not present

## 2021-07-24 DIAGNOSIS — I2510 Atherosclerotic heart disease of native coronary artery without angina pectoris: Secondary | ICD-10-CM | POA: Diagnosis not present

## 2021-07-31 DIAGNOSIS — D519 Vitamin B12 deficiency anemia, unspecified: Secondary | ICD-10-CM | POA: Diagnosis not present

## 2021-07-31 DIAGNOSIS — E034 Atrophy of thyroid (acquired): Secondary | ICD-10-CM | POA: Diagnosis not present

## 2021-07-31 DIAGNOSIS — M810 Age-related osteoporosis without current pathological fracture: Secondary | ICD-10-CM | POA: Diagnosis not present

## 2021-07-31 DIAGNOSIS — E785 Hyperlipidemia, unspecified: Secondary | ICD-10-CM | POA: Diagnosis not present

## 2021-07-31 DIAGNOSIS — N1832 Chronic kidney disease, stage 3b: Secondary | ICD-10-CM | POA: Diagnosis not present

## 2021-07-31 DIAGNOSIS — I480 Paroxysmal atrial fibrillation: Secondary | ICD-10-CM | POA: Diagnosis not present

## 2021-07-31 DIAGNOSIS — E538 Deficiency of other specified B group vitamins: Secondary | ICD-10-CM | POA: Diagnosis not present

## 2021-07-31 DIAGNOSIS — I2510 Atherosclerotic heart disease of native coronary artery without angina pectoris: Secondary | ICD-10-CM | POA: Diagnosis not present

## 2021-07-31 DIAGNOSIS — Z0001 Encounter for general adult medical examination with abnormal findings: Secondary | ICD-10-CM | POA: Diagnosis not present

## 2021-08-09 DIAGNOSIS — I480 Paroxysmal atrial fibrillation: Secondary | ICD-10-CM | POA: Diagnosis not present

## 2021-08-09 DIAGNOSIS — Z7901 Long term (current) use of anticoagulants: Secondary | ICD-10-CM | POA: Diagnosis not present

## 2021-08-09 DIAGNOSIS — Z5181 Encounter for therapeutic drug level monitoring: Secondary | ICD-10-CM | POA: Diagnosis not present

## 2021-08-27 ENCOUNTER — Emergency Department: Payer: PPO

## 2021-08-27 ENCOUNTER — Other Ambulatory Visit: Payer: Self-pay

## 2021-08-27 ENCOUNTER — Encounter: Payer: Self-pay | Admitting: Emergency Medicine

## 2021-08-27 ENCOUNTER — Emergency Department
Admission: EM | Admit: 2021-08-27 | Discharge: 2021-08-27 | Disposition: A | Discharge status: Home / Self Care | Payer: PPO | Attending: Emergency Medicine | Admitting: Emergency Medicine

## 2021-08-27 DIAGNOSIS — Y9301 Activity, walking, marching and hiking: Secondary | ICD-10-CM | POA: Diagnosis not present

## 2021-08-27 DIAGNOSIS — S59902A Unspecified injury of left elbow, initial encounter: Secondary | ICD-10-CM | POA: Diagnosis present

## 2021-08-27 DIAGNOSIS — I10 Essential (primary) hypertension: Secondary | ICD-10-CM | POA: Diagnosis not present

## 2021-08-27 DIAGNOSIS — Z7901 Long term (current) use of anticoagulants: Secondary | ICD-10-CM | POA: Diagnosis not present

## 2021-08-27 DIAGNOSIS — S51012A Laceration without foreign body of left elbow, initial encounter: Secondary | ICD-10-CM | POA: Diagnosis not present

## 2021-08-27 DIAGNOSIS — Z043 Encounter for examination and observation following other accident: Secondary | ICD-10-CM | POA: Diagnosis not present

## 2021-08-27 DIAGNOSIS — W19XXXA Unspecified fall, initial encounter: Secondary | ICD-10-CM

## 2021-08-27 DIAGNOSIS — M47812 Spondylosis without myelopathy or radiculopathy, cervical region: Secondary | ICD-10-CM | POA: Diagnosis not present

## 2021-08-27 DIAGNOSIS — S0101XA Laceration without foreign body of scalp, initial encounter: Secondary | ICD-10-CM | POA: Diagnosis not present

## 2021-08-27 DIAGNOSIS — W1839XA Other fall on same level, initial encounter: Secondary | ICD-10-CM | POA: Diagnosis not present

## 2021-08-27 DIAGNOSIS — S5002XA Contusion of left elbow, initial encounter: Secondary | ICD-10-CM | POA: Diagnosis not present

## 2021-08-27 DIAGNOSIS — S0003XA Contusion of scalp, initial encounter: Secondary | ICD-10-CM | POA: Diagnosis not present

## 2021-08-27 DIAGNOSIS — S81011A Laceration without foreign body, right knee, initial encounter: Secondary | ICD-10-CM | POA: Insufficient documentation

## 2021-08-27 DIAGNOSIS — S51011A Laceration without foreign body of right elbow, initial encounter: Secondary | ICD-10-CM

## 2021-08-27 DIAGNOSIS — I4891 Unspecified atrial fibrillation: Secondary | ICD-10-CM | POA: Diagnosis not present

## 2021-08-27 DIAGNOSIS — R609 Edema, unspecified: Secondary | ICD-10-CM | POA: Diagnosis not present

## 2021-08-27 DIAGNOSIS — M25552 Pain in left hip: Secondary | ICD-10-CM | POA: Diagnosis not present

## 2021-08-27 DIAGNOSIS — R58 Hemorrhage, not elsewhere classified: Secondary | ICD-10-CM | POA: Diagnosis not present

## 2021-08-27 LAB — CBC WITH DIFFERENTIAL/PLATELET
Abs Immature Granulocytes: 0.04 10*3/uL (ref 0.00–0.07)
Basophils Absolute: 0 10*3/uL (ref 0.0–0.1)
Basophils Relative: 1 %
Eosinophils Absolute: 0.2 10*3/uL (ref 0.0–0.5)
Eosinophils Relative: 4 %
HCT: 39 % (ref 36.0–46.0)
Hemoglobin: 12.5 g/dL (ref 12.0–15.0)
Immature Granulocytes: 1 %
Lymphocytes Relative: 25 %
Lymphs Abs: 1.2 10*3/uL (ref 0.7–4.0)
MCH: 31.2 pg (ref 26.0–34.0)
MCHC: 32.1 g/dL (ref 30.0–36.0)
MCV: 97.3 fL (ref 80.0–100.0)
Monocytes Absolute: 0.6 10*3/uL (ref 0.1–1.0)
Monocytes Relative: 12 %
Neutro Abs: 2.8 10*3/uL (ref 1.7–7.7)
Neutrophils Relative %: 57 %
Platelets: 230 10*3/uL (ref 150–400)
RBC: 4.01 MIL/uL (ref 3.87–5.11)
RDW: 12.9 % (ref 11.5–15.5)
WBC: 4.7 10*3/uL (ref 4.0–10.5)
nRBC: 0 % (ref 0.0–0.2)

## 2021-08-27 LAB — BASIC METABOLIC PANEL
Anion gap: 12 (ref 5–15)
BUN: 27 mg/dL — ABNORMAL HIGH (ref 8–23)
CO2: 29 mmol/L (ref 22–32)
Calcium: 9.4 mg/dL (ref 8.9–10.3)
Chloride: 95 mmol/L — ABNORMAL LOW (ref 98–111)
Creatinine, Ser: 1.3 mg/dL — ABNORMAL HIGH (ref 0.44–1.00)
GFR, Estimated: 40 mL/min — ABNORMAL LOW (ref >60)
Glucose, Bld: 105 mg/dL — ABNORMAL HIGH (ref 70–99)
Potassium: 3.6 mmol/L (ref 3.5–5.1)
Sodium: 136 mmol/L (ref 135–145)

## 2021-08-27 LAB — PROTIME-INR
INR: 2.3 — ABNORMAL HIGH (ref 0.8–1.2)
Prothrombin Time: 24.9 seconds — ABNORMAL HIGH (ref 11.4–15.2)

## 2021-08-27 LAB — APTT: aPTT: 36 seconds (ref 24–36)

## 2021-08-27 NOTE — ED Provider Triage Note (Signed)
  Emergency Medicine Provider Triage Evaluation Note  Elizabeth Fields , a 88 y.o.female,  was evaluated in triage.  Pt complains of injuries from fall.  Patient states that she was feeling dizzy at [employer] and she accidentally fell backwards.  Reportedly hit the front left side of her head.  She is currently on warfarin.  Additionally has abrasions to her l elbows.  Currently endorsing left-sided hip pain as well.   Review of Systems  Positive: Headache, hip pain Negative: Denies fever, chest pain, vomiting  Physical Exam  There were no vitals filed for this visit. Gen:   Awake, no distress   Resp:  Normal effort  MSK:   Moves extremities without difficulty  Other:  Tenderness in the left hip joint.  Scalp hematoma on left frontal side.  Medical Decision Making  Given the patient's initial medical screening exam, the following diagnostic evaluation has been ordered. The patient will be placed in the appropriate treatment space, once one is available, to complete the evaluation and treatment. I have discussed the plan of care with the patient and I have advised the patient that an ED physician or mid-level practitioner will reevaluate their condition after the test results have been received, as the results may give them additional insight into the type of treatment they may need.    Diagnostics: Labs, head CT, cervical spine CT, EKG left hip  Treatments: none immediately   Teodoro Spray, [place] 08/27/21 1630

## 2021-08-27 NOTE — Discharge Instructions (Addendum)
Please use ibuprofen (Motrin) up to 800 mg every 8 hours, naproxen (Naprosyn) up to 500 mg every 12 hours, and/or acetaminophen (Tylenol) up to 4 g/day for any continued pain 

## 2021-08-27 NOTE — ED Triage Notes (Signed)
Pt in via EMS from [place] with c/o fall. Pt was walking and fell from standing. Pt with abrasions to bilateral elbows. Pt also with left hip pain, which she had a replacement in 2 years ago. Pt also has a knot on top of her head and takes blood thinners.

## 2021-08-27 NOTE — ED Notes (Signed)
Pt A&O, pt given discharge instructions, pt assisted to vehicle by RN.

## 2021-08-27 NOTE — ED Provider Notes (Signed)
[facility] Provider Note   Event Date/Time   First MD Initiated Contact with Patient 08/27/21 2102     (approximate) History  Fall  HPI Elizabeth Fields is a 88 y.o. female who presents after a mechanical fall from standing resulting in skin tears to bilateral elbows as well as the left knee and a contusion to her scalp.  Patient states that she feels as though she lost her balance and then lost her footing resulting in this fall.  Patient denies any loss of consciousness.  Patient denies any subsequent loss of consciousness, palpitations, or significant bleeding from these wounds.  Patient states that she is on warfarin for paroxysmal atrial fibrillation. ROS: Patient currently denies any vision changes, tinnitus, difficulty speaking, facial droop, sore throat, chest pain, shortness of breath, abdominal pain, nausea/vomiting/diarrhea, dysuria, or weakness/numbness/paresthesias in any extremity   Physical Exam  Triage Vital Signs: ED Triage Vitals  Enc Vitals Group     BP 08/27/21 1643 (!) 146/75     Pulse Rate 08/27/21 1643 88     Resp 08/27/21 1643 15     Temp 08/27/21 1643 98.3 F (36.8 C)     Temp Source 08/27/21 1643 Oral     SpO2 08/27/21 1643 100 %     Weight --      Height --      Head Circumference --      Peak Flow --      Pain Score 08/27/21 1641 0     Pain Loc --      Pain Edu? --      Excl. in [place]? --    Most recent vital signs: Vitals:   08/27/21 2144 08/27/21 2230  BP: (!) 128/58 119/78  Pulse: 87 76  Resp: 16 18  Temp: 98.1 F (36.7 C) 98.2 F (36.8 C)  SpO2: 96% 98%   General: Awake, oriented x4. CV:  Good peripheral perfusion.  Resp:  Normal effort.  Abd:  No distention.  Other:  Elderly cachectic Caucasian female laying in bed in no acute distress.  There are small skin tears to bilateral elbows as well as overlying the right knee.  There is also a small hematoma in the left frontal scalp ED Results / Procedures / Treatments   Labs (all labs ordered are listed, but only abnormal results are displayed) Labs Reviewed  BASIC METABOLIC PANEL - Abnormal; Notable for the following components:      Result Value   Chloride 95 (*)    Glucose, Bld 105 (*)    BUN 27 (*)    Creatinine, Ser 1.30 (*)    GFR, Estimated 40 (*)    All other components within normal limits  PROTIME-INR - Abnormal; Notable for the following components:   Prothrombin Time 24.9 (*)    INR 2.3 (*)    All other components within normal limits  CBC WITH DIFFERENTIAL/PLATELET  APTT   EKG ED ECG REPORT I, Naaman Plummer, the attending physician, personally viewed and interpreted this ECG. Date: 08/27/2021 EKG Time: 1649 Rate: 83 Rhythm: atrial fibrillation QRS Axis: normal Intervals: normal ST/T Wave abnormalities: normal Narrative Interpretation: Atrial fibrillation.  No evidence of acute ischemia RADIOLOGY ED MD interpretation: CT of the head without contrast interpreted by me shows no evidence of acute abnormalities including no intracerebral hemorrhage, obvious masses, or significant edema.  There is evidence of a left frontal scalp hematoma  CT of the cervical spine interpreted by me does not  show any evidence of acute abnormalities including no acute fracture, malalignment, height loss, or dislocation  X-ray of the left hip interpreted by me and shows no evidence of acute fracture or dislocation -Agree with radiology assessment Official radiology report(s): CT Head Wo Contrast  Result Date: 08/27/2021 CLINICAL DATA:  Fall EXAM: CT HEAD WITHOUT CONTRAST CT CERVICAL SPINE WITHOUT CONTRAST TECHNIQUE: Multidetector CT imaging of the head and cervical spine was performed following the standard protocol without intravenous contrast. Multiplanar CT image reconstructions of the cervical spine were also generated. RADIATION DOSE REDUCTION: This exam was performed according to the departmental dose-optimization program which includes automated  exposure control, adjustment of the mA and/or kV according to patient size and/or use of iterative reconstruction technique. COMPARISON:  None Available. FINDINGS: CT HEAD FINDINGS Brain: There is no acute intracranial hemorrhage, extra-axial fluid collection, or acute infarct. Parenchymal volume is normal for age. The ventricles are normal in size. Gray-white differentiation is preserved There is no mass lesion.  There is no mass effect or midline shift. Vascular: There is calcification of the bilateral cavernous ICAs. Skull: Normal. Negative for fracture or focal lesion. Sinuses/Orbits: The imaged paranasal sinuses are clear. Bilateral lens implants are in place. The globes and orbits are otherwise unremarkable. Other: There is a small left frontal scalp hematoma. CT CERVICAL SPINE FINDINGS Alignment: There is mild retrolisthesis of C3 on C4, anterolisthesis of C4 on C5, and retrolisthesis of C5 on C6, all likely degenerative in nature. There is no jumped or perched facet or other evidence of traumatic malalignment. Skull base and vertebrae: Skull base alignment is maintained. Vertebral body heights are preserved. There is no evidence of acute fracture. Soft tissues and spinal canal: No prevertebral fluid or swelling. No visible canal hematoma. Disc levels: There is advanced multilevel disc space narrowing, most severe at C5-C6 and T1-T2. There is multilevel facet arthropathy, most advanced at C4-C5 and C7-T1. Upper chest: There is scarring in the lung apices. Other: None. IMPRESSION: 1. No acute intracranial pathology. 2. Small left frontal scalp hematoma without underlying calvarial fracture. 3. No acute fracture or traumatic malalignment of the cervical spine. Electronically Signed   By: Valetta Mole M.D.   On: 08/27/2021 17:33   CT Cervical Spine Wo Contrast  Result Date: 08/27/2021 CLINICAL DATA:  Fall EXAM: CT HEAD WITHOUT CONTRAST CT CERVICAL SPINE WITHOUT CONTRAST TECHNIQUE: Multidetector CT imaging of  the head and cervical spine was performed following the standard protocol without intravenous contrast. Multiplanar CT image reconstructions of the cervical spine were also generated. RADIATION DOSE REDUCTION: This exam was performed according to the departmental dose-optimization program which includes automated exposure control, adjustment of the mA and/or kV according to patient size and/or use of iterative reconstruction technique. COMPARISON:  None Available. FINDINGS: CT HEAD FINDINGS Brain: There is no acute intracranial hemorrhage, extra-axial fluid collection, or acute infarct. Parenchymal volume is normal for age. The ventricles are normal in size. Gray-white differentiation is preserved There is no mass lesion.  There is no mass effect or midline shift. Vascular: There is calcification of the bilateral cavernous ICAs. Skull: Normal. Negative for fracture or focal lesion. Sinuses/Orbits: The imaged paranasal sinuses are clear. Bilateral lens implants are in place. The globes and orbits are otherwise unremarkable. Other: There is a small left frontal scalp hematoma. CT CERVICAL SPINE FINDINGS Alignment: There is mild retrolisthesis of C3 on C4, anterolisthesis of C4 on C5, and retrolisthesis of C5 on C6, all likely degenerative in nature. There is no jumped or  perched facet or other evidence of traumatic malalignment. Skull base and vertebrae: Skull base alignment is maintained. Vertebral body heights are preserved. There is no evidence of acute fracture. Soft tissues and spinal canal: No prevertebral fluid or swelling. No visible canal hematoma. Disc levels: There is advanced multilevel disc space narrowing, most severe at C5-C6 and T1-T2. There is multilevel facet arthropathy, most advanced at C4-C5 and C7-T1. Upper chest: There is scarring in the lung apices. Other: None. IMPRESSION: 1. No acute intracranial pathology. 2. Small left frontal scalp hematoma without underlying calvarial fracture. 3. No  acute fracture or traumatic malalignment of the cervical spine. Electronically Signed   By: Valetta Mole M.D.   On: 08/27/2021 17:33   DG Hip Unilat With Pelvis 2-3 Views Left  Result Date: 08/27/2021 CLINICAL DATA:  Left hip pain, fall EXAM: DG HIP (WITH OR WITHOUT PELVIS) 2-3V LEFT COMPARISON:  02/09/2019, 02/12/2019 FINDINGS: Hip screws are present on the right. Prior left hip replacement. Stable posttraumatic deformity in the right pubic bone and inferior pubic ramus. No acute fracture, subluxation or dislocation. IMPRESSION: No acute bony abnormality. Electronically Signed   By: Rolm Baptise M.D.   On: 08/27/2021 17:17   PROCEDURES: Critical Care performed: No .1-3 Lead EKG Interpretation  Performed by: Naaman Plummer, MD Authorized by: Naaman Plummer, MD     Interpretation: abnormal     ECG rate:  75   ECG rate assessment: normal     Rhythm: atrial fibrillation     Ectopy: none     Conduction: normal    MEDICATIONS ORDERED IN ED: Medications - No data to display IMPRESSION / MDM / [place] / ED COURSE  I reviewed the triage vital signs and the nursing notes.                             The patient is on the cardiac monitor to evaluate for evidence of arrhythmia and/or significant heart rate changes. Patient's presentation is most consistent with acute presentation with potential threat to life or bodily function. Presenting after a fall that occurred just prior to arrival, resulting in injury to the bilateral elbows, left frontal scalp, left hip and left knee. The mechanism of injury was a mechanical ground level fall without syncope or near-syncope. The current level of pain is moderate. There was no loss of consciousness, confusion, seizure, or memory impairment. There is a laceration associated with the injury.  Wound was cleansed at bedside and dressed however no repair is necessary Denies neck pain. The patient does take blood thinner medications. Denies  vomiting, numbness/weakness, fever  Dispo: Discharge with PCP follow-up       FINAL CLINICAL IMPRESSION(S) / ED DIAGNOSES   Final diagnoses:  Fall, initial encounter  Contusion of scalp, initial encounter  Skin tear of left elbow without complication, initial encounter  Skin tear of right elbow without complication, initial encounter   Rx / DC Orders   ED Discharge Orders     None      Note:  This document was prepared using Dragon voice recognition software and may include unintentional dictation errors.   Naaman Plummer, MD 08/27/21 [id number]

## 2021-09-27 DIAGNOSIS — S51011D Laceration without foreign body of right elbow, subsequent encounter: Secondary | ICD-10-CM | POA: Diagnosis not present

## 2021-09-27 DIAGNOSIS — S1093XD Contusion of unspecified part of neck, subsequent encounter: Secondary | ICD-10-CM | POA: Diagnosis not present

## 2021-09-27 DIAGNOSIS — I70209 Unspecified atherosclerosis of native arteries of extremities, unspecified extremity: Secondary | ICD-10-CM | POA: Diagnosis not present

## 2021-09-27 DIAGNOSIS — S0083XD Contusion of other part of head, subsequent encounter: Secondary | ICD-10-CM | POA: Diagnosis not present

## 2021-09-27 DIAGNOSIS — M461 Sacroiliitis, not elsewhere classified: Secondary | ICD-10-CM | POA: Diagnosis not present

## 2021-09-27 DIAGNOSIS — S0003XD Contusion of scalp, subsequent encounter: Secondary | ICD-10-CM | POA: Diagnosis not present

## 2021-09-28 DIAGNOSIS — M25552 Pain in left hip: Secondary | ICD-10-CM | POA: Diagnosis not present

## 2021-09-28 DIAGNOSIS — M545 Low back pain, unspecified: Secondary | ICD-10-CM | POA: Diagnosis not present

## 2021-10-17 DIAGNOSIS — E785 Hyperlipidemia, unspecified: Secondary | ICD-10-CM | POA: Diagnosis not present

## 2021-10-17 DIAGNOSIS — Z7901 Long term (current) use of anticoagulants: Secondary | ICD-10-CM | POA: Diagnosis not present

## 2021-10-17 DIAGNOSIS — I341 Nonrheumatic mitral (valve) prolapse: Secondary | ICD-10-CM | POA: Diagnosis not present

## 2021-10-17 DIAGNOSIS — I340 Nonrheumatic mitral (valve) insufficiency: Secondary | ICD-10-CM | POA: Diagnosis not present

## 2021-10-17 DIAGNOSIS — I351 Nonrheumatic aortic (valve) insufficiency: Secondary | ICD-10-CM | POA: Diagnosis not present

## 2021-10-17 DIAGNOSIS — Z5181 Encounter for therapeutic drug level monitoring: Secondary | ICD-10-CM | POA: Diagnosis not present

## 2021-10-17 DIAGNOSIS — I480 Paroxysmal atrial fibrillation: Secondary | ICD-10-CM | POA: Diagnosis not present

## 2021-10-17 DIAGNOSIS — I493 Ventricular premature depolarization: Secondary | ICD-10-CM | POA: Diagnosis not present

## 2021-10-17 DIAGNOSIS — Z23 Encounter for immunization: Secondary | ICD-10-CM | POA: Diagnosis not present

## 2021-10-17 DIAGNOSIS — I2510 Atherosclerotic heart disease of native coronary artery without angina pectoris: Secondary | ICD-10-CM | POA: Diagnosis not present

## 2021-10-17 DIAGNOSIS — I361 Nonrheumatic tricuspid (valve) insufficiency: Secondary | ICD-10-CM | POA: Diagnosis not present

## 2021-10-17 DIAGNOSIS — Z9889 Other specified postprocedural states: Secondary | ICD-10-CM | POA: Diagnosis not present

## 2021-10-23 DIAGNOSIS — M5136 Other intervertebral disc degeneration, lumbar region: Secondary | ICD-10-CM | POA: Diagnosis not present

## 2021-10-23 DIAGNOSIS — M5416 Radiculopathy, lumbar region: Secondary | ICD-10-CM | POA: Diagnosis not present

## 2021-10-24 ENCOUNTER — Other Ambulatory Visit: Payer: Self-pay | Admitting: Family Medicine

## 2021-10-24 DIAGNOSIS — M5416 Radiculopathy, lumbar region: Secondary | ICD-10-CM

## 2021-11-06 ENCOUNTER — Ambulatory Visit: Payer: PPO | Admitting: Dermatology

## 2021-11-08 ENCOUNTER — Ambulatory Visit
Admission: RE | Admit: 2021-11-08 | Discharge: 2021-11-08 | Disposition: A | Discharge status: Home / Self Care | Payer: PPO | Source: Ambulatory Visit | Attending: Family Medicine | Admitting: Family Medicine

## 2021-11-08 DIAGNOSIS — M5416 Radiculopathy, lumbar region: Secondary | ICD-10-CM

## 2021-11-08 DIAGNOSIS — M545 Low back pain, unspecified: Secondary | ICD-10-CM | POA: Diagnosis not present

## 2021-11-21 DIAGNOSIS — M5136 Other intervertebral disc degeneration, lumbar region: Secondary | ICD-10-CM | POA: Diagnosis not present

## 2021-11-21 DIAGNOSIS — M5416 Radiculopathy, lumbar region: Secondary | ICD-10-CM | POA: Diagnosis not present

## 2021-11-21 DIAGNOSIS — M4856XA Collapsed vertebra, not elsewhere classified, lumbar region, initial encounter for fracture: Secondary | ICD-10-CM | POA: Diagnosis not present

## 2021-12-06 DIAGNOSIS — E538 Deficiency of other specified B group vitamins: Secondary | ICD-10-CM | POA: Diagnosis not present

## 2021-12-06 DIAGNOSIS — Z5181 Encounter for therapeutic drug level monitoring: Secondary | ICD-10-CM | POA: Diagnosis not present

## 2021-12-06 DIAGNOSIS — I480 Paroxysmal atrial fibrillation: Secondary | ICD-10-CM | POA: Diagnosis not present

## 2021-12-06 DIAGNOSIS — Z7901 Long term (current) use of anticoagulants: Secondary | ICD-10-CM | POA: Diagnosis not present

## 2021-12-06 DIAGNOSIS — E034 Atrophy of thyroid (acquired): Secondary | ICD-10-CM | POA: Diagnosis not present

## 2021-12-11 DIAGNOSIS — E034 Atrophy of thyroid (acquired): Secondary | ICD-10-CM | POA: Diagnosis not present

## 2021-12-11 DIAGNOSIS — I480 Paroxysmal atrial fibrillation: Secondary | ICD-10-CM | POA: Diagnosis not present

## 2021-12-11 DIAGNOSIS — N1832 Chronic kidney disease, stage 3b: Secondary | ICD-10-CM | POA: Diagnosis not present

## 2021-12-11 DIAGNOSIS — I2510 Atherosclerotic heart disease of native coronary artery without angina pectoris: Secondary | ICD-10-CM | POA: Diagnosis not present

## 2021-12-11 DIAGNOSIS — D519 Vitamin B12 deficiency anemia, unspecified: Secondary | ICD-10-CM | POA: Diagnosis not present

## 2021-12-11 DIAGNOSIS — E785 Hyperlipidemia, unspecified: Secondary | ICD-10-CM | POA: Diagnosis not present

## 2021-12-11 DIAGNOSIS — M810 Age-related osteoporosis without current pathological fracture: Secondary | ICD-10-CM | POA: Diagnosis not present

## 2021-12-11 DIAGNOSIS — E440 Moderate protein-calorie malnutrition: Secondary | ICD-10-CM | POA: Diagnosis not present

## 2021-12-13 DIAGNOSIS — Z961 Presence of intraocular lens: Secondary | ICD-10-CM | POA: Diagnosis not present

## 2022-01-09 DIAGNOSIS — E538 Deficiency of other specified B group vitamins: Secondary | ICD-10-CM | POA: Diagnosis not present

## 2022-01-09 DIAGNOSIS — Z5181 Encounter for therapeutic drug level monitoring: Secondary | ICD-10-CM | POA: Diagnosis not present

## 2022-01-09 DIAGNOSIS — Z7901 Long term (current) use of anticoagulants: Secondary | ICD-10-CM | POA: Diagnosis not present

## 2022-02-12 DIAGNOSIS — I4891 Unspecified atrial fibrillation: Secondary | ICD-10-CM | POA: Diagnosis not present

## 2022-02-12 DIAGNOSIS — E538 Deficiency of other specified B group vitamins: Secondary | ICD-10-CM | POA: Diagnosis not present

## 2022-03-05 DIAGNOSIS — I361 Nonrheumatic tricuspid (valve) insufficiency: Secondary | ICD-10-CM | POA: Diagnosis not present

## 2022-03-05 DIAGNOSIS — I341 Nonrheumatic mitral (valve) prolapse: Secondary | ICD-10-CM | POA: Diagnosis not present

## 2022-03-05 DIAGNOSIS — I493 Ventricular premature depolarization: Secondary | ICD-10-CM | POA: Diagnosis not present

## 2022-03-05 DIAGNOSIS — I480 Paroxysmal atrial fibrillation: Secondary | ICD-10-CM | POA: Diagnosis not present

## 2022-03-05 DIAGNOSIS — I2510 Atherosclerotic heart disease of native coronary artery without angina pectoris: Secondary | ICD-10-CM | POA: Diagnosis not present

## 2022-03-05 DIAGNOSIS — Z9889 Other specified postprocedural states: Secondary | ICD-10-CM | POA: Diagnosis not present

## 2022-03-05 DIAGNOSIS — N1832 Chronic kidney disease, stage 3b: Secondary | ICD-10-CM | POA: Diagnosis not present

## 2022-03-05 DIAGNOSIS — E785 Hyperlipidemia, unspecified: Secondary | ICD-10-CM | POA: Diagnosis not present

## 2022-03-05 DIAGNOSIS — I340 Nonrheumatic mitral (valve) insufficiency: Secondary | ICD-10-CM | POA: Diagnosis not present

## 2022-03-05 DIAGNOSIS — I70209 Unspecified atherosclerosis of native arteries of extremities, unspecified extremity: Secondary | ICD-10-CM | POA: Diagnosis not present

## 2022-03-05 DIAGNOSIS — I351 Nonrheumatic aortic (valve) insufficiency: Secondary | ICD-10-CM | POA: Diagnosis not present

## 2022-03-15 DIAGNOSIS — I4891 Unspecified atrial fibrillation: Secondary | ICD-10-CM | POA: Diagnosis not present

## 2022-03-15 DIAGNOSIS — E538 Deficiency of other specified B group vitamins: Secondary | ICD-10-CM | POA: Diagnosis not present

## 2022-04-09 DIAGNOSIS — I4891 Unspecified atrial fibrillation: Secondary | ICD-10-CM | POA: Diagnosis not present

## 2022-04-09 DIAGNOSIS — E034 Atrophy of thyroid (acquired): Secondary | ICD-10-CM | POA: Diagnosis not present

## 2022-04-16 DIAGNOSIS — I480 Paroxysmal atrial fibrillation: Secondary | ICD-10-CM | POA: Diagnosis not present

## 2022-04-16 DIAGNOSIS — M461 Sacroiliitis, not elsewhere classified: Secondary | ICD-10-CM | POA: Diagnosis not present

## 2022-04-16 DIAGNOSIS — M810 Age-related osteoporosis without current pathological fracture: Secondary | ICD-10-CM | POA: Diagnosis not present

## 2022-04-16 DIAGNOSIS — I2510 Atherosclerotic heart disease of native coronary artery without angina pectoris: Secondary | ICD-10-CM | POA: Diagnosis not present

## 2022-04-16 DIAGNOSIS — D519 Vitamin B12 deficiency anemia, unspecified: Secondary | ICD-10-CM | POA: Diagnosis not present

## 2022-04-16 DIAGNOSIS — E538 Deficiency of other specified B group vitamins: Secondary | ICD-10-CM | POA: Diagnosis not present

## 2022-04-16 DIAGNOSIS — Z0000 Encounter for general adult medical examination without abnormal findings: Secondary | ICD-10-CM | POA: Diagnosis not present

## 2022-04-16 DIAGNOSIS — E440 Moderate protein-calorie malnutrition: Secondary | ICD-10-CM | POA: Diagnosis not present

## 2022-04-16 DIAGNOSIS — E034 Atrophy of thyroid (acquired): Secondary | ICD-10-CM | POA: Diagnosis not present

## 2022-04-16 DIAGNOSIS — E785 Hyperlipidemia, unspecified: Secondary | ICD-10-CM | POA: Diagnosis not present

## 2022-04-16 DIAGNOSIS — N1832 Chronic kidney disease, stage 3b: Secondary | ICD-10-CM | POA: Diagnosis not present

## 2022-04-27 ENCOUNTER — Other Ambulatory Visit: Payer: Self-pay | Admitting: Internal Medicine

## 2022-04-27 DIAGNOSIS — Z1231 Encounter for screening mammogram for malignant neoplasm of breast: Secondary | ICD-10-CM

## 2022-05-02 DIAGNOSIS — H6123 Impacted cerumen, bilateral: Secondary | ICD-10-CM | POA: Diagnosis not present

## 2022-05-02 DIAGNOSIS — H903 Sensorineural hearing loss, bilateral: Secondary | ICD-10-CM | POA: Diagnosis not present

## 2022-05-09 DIAGNOSIS — Z7901 Long term (current) use of anticoagulants: Secondary | ICD-10-CM | POA: Diagnosis not present

## 2022-05-09 DIAGNOSIS — Z5181 Encounter for therapeutic drug level monitoring: Secondary | ICD-10-CM | POA: Diagnosis not present

## 2022-05-21 DIAGNOSIS — E538 Deficiency of other specified B group vitamins: Secondary | ICD-10-CM | POA: Diagnosis not present

## 2022-05-21 DIAGNOSIS — Z23 Encounter for immunization: Secondary | ICD-10-CM | POA: Diagnosis not present

## 2022-06-01 DIAGNOSIS — H90A21 Sensorineural hearing loss, unilateral, right ear, with restricted hearing on the contralateral side: Secondary | ICD-10-CM | POA: Diagnosis not present

## 2022-06-11 DIAGNOSIS — Z7901 Long term (current) use of anticoagulants: Secondary | ICD-10-CM | POA: Diagnosis not present

## 2022-06-11 DIAGNOSIS — Z5181 Encounter for therapeutic drug level monitoring: Secondary | ICD-10-CM | POA: Diagnosis not present

## 2022-06-18 ENCOUNTER — Ambulatory Visit
Admission: RE | Admit: 2022-06-18 | Discharge: 2022-06-18 | Disposition: A | Discharge status: Home / Self Care | Payer: PPO | Source: Ambulatory Visit | Attending: Internal Medicine | Admitting: Internal Medicine

## 2022-06-18 DIAGNOSIS — Z1231 Encounter for screening mammogram for malignant neoplasm of breast: Secondary | ICD-10-CM | POA: Insufficient documentation

## 2022-06-22 DIAGNOSIS — E538 Deficiency of other specified B group vitamins: Secondary | ICD-10-CM | POA: Diagnosis not present

## 2022-07-11 DIAGNOSIS — I4891 Unspecified atrial fibrillation: Secondary | ICD-10-CM | POA: Diagnosis not present

## 2022-07-23 DIAGNOSIS — E538 Deficiency of other specified B group vitamins: Secondary | ICD-10-CM | POA: Diagnosis not present

## 2022-08-03 ENCOUNTER — Other Ambulatory Visit: Payer: Self-pay | Admitting: Cardiology

## 2022-08-03 ENCOUNTER — Ambulatory Visit
Admission: RE | Admit: 2022-08-03 | Discharge: 2022-08-03 | Disposition: A | Discharge status: Home / Self Care | Payer: PPO | Source: Ambulatory Visit | Attending: Cardiology | Admitting: Cardiology

## 2022-08-03 DIAGNOSIS — I361 Nonrheumatic tricuspid (valve) insufficiency: Secondary | ICD-10-CM | POA: Diagnosis not present

## 2022-08-03 DIAGNOSIS — I493 Ventricular premature depolarization: Secondary | ICD-10-CM | POA: Diagnosis not present

## 2022-08-03 DIAGNOSIS — Z9889 Other specified postprocedural states: Secondary | ICD-10-CM | POA: Diagnosis not present

## 2022-08-03 DIAGNOSIS — M79604 Pain in right leg: Secondary | ICD-10-CM | POA: Insufficient documentation

## 2022-08-03 DIAGNOSIS — I480 Paroxysmal atrial fibrillation: Secondary | ICD-10-CM | POA: Diagnosis not present

## 2022-08-03 DIAGNOSIS — I340 Nonrheumatic mitral (valve) insufficiency: Secondary | ICD-10-CM | POA: Diagnosis not present

## 2022-08-03 DIAGNOSIS — I351 Nonrheumatic aortic (valve) insufficiency: Secondary | ICD-10-CM | POA: Diagnosis not present

## 2022-08-03 DIAGNOSIS — N1832 Chronic kidney disease, stage 3b: Secondary | ICD-10-CM | POA: Diagnosis not present

## 2022-08-03 DIAGNOSIS — I2510 Atherosclerotic heart disease of native coronary artery without angina pectoris: Secondary | ICD-10-CM | POA: Diagnosis not present

## 2022-08-03 DIAGNOSIS — I341 Nonrheumatic mitral (valve) prolapse: Secondary | ICD-10-CM | POA: Diagnosis not present

## 2022-08-03 DIAGNOSIS — E785 Hyperlipidemia, unspecified: Secondary | ICD-10-CM | POA: Diagnosis not present

## 2022-08-14 DIAGNOSIS — I4891 Unspecified atrial fibrillation: Secondary | ICD-10-CM | POA: Diagnosis not present

## 2022-08-14 DIAGNOSIS — E034 Atrophy of thyroid (acquired): Secondary | ICD-10-CM | POA: Diagnosis not present

## 2022-08-21 ENCOUNTER — Ambulatory Visit: Payer: PPO | Admitting: Dermatology

## 2022-08-21 DIAGNOSIS — Z0001 Encounter for general adult medical examination with abnormal findings: Secondary | ICD-10-CM | POA: Diagnosis not present

## 2022-08-21 DIAGNOSIS — L578 Other skin changes due to chronic exposure to nonionizing radiation: Secondary | ICD-10-CM | POA: Diagnosis not present

## 2022-08-21 DIAGNOSIS — E785 Hyperlipidemia, unspecified: Secondary | ICD-10-CM | POA: Diagnosis not present

## 2022-08-21 DIAGNOSIS — E440 Moderate protein-calorie malnutrition: Secondary | ICD-10-CM | POA: Diagnosis not present

## 2022-08-21 DIAGNOSIS — W908XXA Exposure to other nonionizing radiation, initial encounter: Secondary | ICD-10-CM

## 2022-08-21 DIAGNOSIS — Z7189 Other specified counseling: Secondary | ICD-10-CM

## 2022-08-21 DIAGNOSIS — B078 Other viral warts: Secondary | ICD-10-CM

## 2022-08-21 DIAGNOSIS — L570 Actinic keratosis: Secondary | ICD-10-CM

## 2022-08-21 DIAGNOSIS — M810 Age-related osteoporosis without current pathological fracture: Secondary | ICD-10-CM | POA: Diagnosis not present

## 2022-08-21 DIAGNOSIS — E034 Atrophy of thyroid (acquired): Secondary | ICD-10-CM | POA: Diagnosis not present

## 2022-08-21 DIAGNOSIS — I480 Paroxysmal atrial fibrillation: Secondary | ICD-10-CM | POA: Diagnosis not present

## 2022-08-21 DIAGNOSIS — N1832 Chronic kidney disease, stage 3b: Secondary | ICD-10-CM | POA: Diagnosis not present

## 2022-08-21 DIAGNOSIS — I2510 Atherosclerotic heart disease of native coronary artery without angina pectoris: Secondary | ICD-10-CM | POA: Diagnosis not present

## 2022-08-21 DIAGNOSIS — L820 Inflamed seborrheic keratosis: Secondary | ICD-10-CM | POA: Diagnosis not present

## 2022-08-21 DIAGNOSIS — D519 Vitamin B12 deficiency anemia, unspecified: Secondary | ICD-10-CM | POA: Diagnosis not present

## 2022-08-21 NOTE — Progress Notes (Signed)
Follow-Up Visit   Subjective  Elizabeth Fields is a 89 y.o. female who presents for the following:  The patient has spots, moles and lesions to be evaluated, some may be new or changing and the patient may have concern these could be cancer.   The following portions of the chart were reviewed this encounter and updated as appropriate: medications, allergies, medical history  Review of Systems:  No other skin or systemic complaints except as noted in HPI or Assessment and Plan.  Objective  Well appearing patient in no apparent distress; mood and affect are within normal limits.  A focused examination was performed of the following areas:face,hands,arms,fingers   Relevant physical exam findings are noted in the Assessment and Plan.  right lateral nasal bridge/infraorbitial x 1, left mandible x 1, upper lip midline x 1, nasal root x 1 (4) Erythematous thin papules/macules with gritty scale.   right proximal mandible x 2 (2) Stuck-on, waxy, tan-brown papules -- Discussed benign etiology and prognosis.   left ring finger x 1, right little finger x 1 (2) Verrucous papules -- Discussed viral etiology and contagion.     Assessment & Plan   AK (actinic keratosis) (4) right lateral nasal bridge/infraorbitial x 1, left mandible x 1, upper lip midline x 1, nasal root x 1  Actinic keratoses are precancerous spots that appear secondary to cumulative UV radiation exposure/sun exposure over time. They are chronic with expected duration over 1 year. A portion of actinic keratoses will progress to squamous cell carcinoma of the skin. It is not possible to reliably predict which spots will progress to skin cancer and so treatment is recommended to prevent development of skin cancer.  Recommend daily broad spectrum sunscreen SPF 30+ to sun-exposed areas, reapply every 2 hours as needed.  Recommend staying in the shade or wearing long sleeves, sun glasses (UVA+UVB protection) and wide brim hats  (4-inch brim around the entire circumference of the hat). Call for new or changing lesions.    ACTINIC DAMAGE - chronic, secondary to cumulative UV radiation exposure/sun exposure over time - diffuse scaly erythematous macules with underlying dyspigmentation - Recommend daily broad spectrum sunscreen SPF 30+ to sun-exposed areas, reapply every 2 hours as needed.  - Recommend staying in the shade or wearing long sleeves, sun glasses (UVA+UVB protection) and wide brim hats (4-inch brim around the entire circumference of the hat). - Call for new or changing lesions.   Destruction of lesion - right lateral nasal bridge/infraorbitial x 1, left mandible x 1, upper lip midline x 1, nasal root x 1 (4) Complexity: simple   Destruction method: cryotherapy   Informed consent: discussed and consent obtained   Timeout:  patient name, date of birth, surgical site, and procedure verified Lesion destroyed using liquid nitrogen: Yes   Region frozen until ice ball extended beyond lesion: Yes   Outcome: patient tolerated procedure well with no complications   Post-procedure details: wound care instructions given    Inflamed seborrheic keratosis (2) right proximal mandible x 2  Symptomatic, irritating, patient would like treated.   Destruction of lesion - right proximal mandible x 2 (2)  Other viral warts (2) left ring finger x 1, right little finger x 1  Viral Wart (HPV) Counseling  Discussed viral / HPV (Human Papilloma Virus) etiology and risk of spread /infectivity to other areas of body as well as to other people.  Multiple treatments and methods may be required to clear warts and it is possible treatment may  not be successful.  Treatment risks include discoloration; scarring and there is still potential for wart recurrence.   Destruction of lesion - left ring finger x 1, right little finger x 1 (2) Complexity: simple   Destruction method: cryotherapy   Informed consent: discussed and consent  obtained   Timeout:  patient name, date of birth, surgical site, and procedure verified Lesion destroyed using liquid nitrogen: Yes   Region frozen until ice ball extended beyond lesion: Yes   Outcome: patient tolerated procedure well with no complications   Post-procedure details: wound care instructions given       Return in about 7 months (around 03/21/2023) for [place] .  IAngelique Holm, CMA, am acting as scribe for Armida Sans, MD .   Documentation: I have reviewed the above documentation for accuracy and completeness, and I agree with the above.  Armida Sans, MD

## 2022-08-21 NOTE — Patient Instructions (Addendum)

## 2022-08-27 DIAGNOSIS — E538 Deficiency of other specified B group vitamins: Secondary | ICD-10-CM | POA: Diagnosis not present

## 2022-08-31 ENCOUNTER — Encounter: Payer: Self-pay | Admitting: Dermatology

## 2022-09-13 DIAGNOSIS — I4891 Unspecified atrial fibrillation: Secondary | ICD-10-CM | POA: Diagnosis not present

## 2022-10-01 DIAGNOSIS — S81801A Unspecified open wound, right lower leg, initial encounter: Secondary | ICD-10-CM | POA: Diagnosis not present

## 2022-10-01 DIAGNOSIS — E538 Deficiency of other specified B group vitamins: Secondary | ICD-10-CM | POA: Diagnosis not present

## 2022-10-05 DIAGNOSIS — J069 Acute upper respiratory infection, unspecified: Secondary | ICD-10-CM | POA: Diagnosis not present

## 2022-10-18 ENCOUNTER — Ambulatory Visit: Payer: PPO | Admitting: Physician Assistant

## 2022-10-30 ENCOUNTER — Encounter: Payer: Self-pay | Admitting: Dermatology

## 2022-10-30 ENCOUNTER — Ambulatory Visit: Payer: PPO | Admitting: Dermatology

## 2022-10-30 DIAGNOSIS — I872 Venous insufficiency (chronic) (peripheral): Secondary | ICD-10-CM | POA: Diagnosis not present

## 2022-10-30 DIAGNOSIS — L97822 Non-pressure chronic ulcer of other part of left lower leg with fat layer exposed: Secondary | ICD-10-CM | POA: Diagnosis not present

## 2022-10-30 DIAGNOSIS — L97902 Non-pressure chronic ulcer of unspecified part of unspecified lower leg with fat layer exposed: Secondary | ICD-10-CM | POA: Insufficient documentation

## 2022-10-30 DIAGNOSIS — D1801 Hemangioma of skin and subcutaneous tissue: Secondary | ICD-10-CM

## 2022-10-30 DIAGNOSIS — D692 Other nonthrombocytopenic purpura: Secondary | ICD-10-CM | POA: Diagnosis not present

## 2022-10-30 DIAGNOSIS — L97812 Non-pressure chronic ulcer of other part of right lower leg with fat layer exposed: Secondary | ICD-10-CM | POA: Diagnosis not present

## 2022-10-30 DIAGNOSIS — T148XXA Other injury of unspecified body region, initial encounter: Secondary | ICD-10-CM

## 2022-10-30 MED ORDER — TRIAMCINOLONE ACETONIDE 0.1 % EX OINT: 1.0000 | TOPICAL_OINTMENT | Freq: Two times a day (BID) | CUTANEOUS | 1 refills | Status: DC | PRN

## 2022-10-30 NOTE — Progress Notes (Unsigned)
   Follow-Up Visit   Subjective  Elizabeth Fields is a 89 y.o. female who presents for the following: issues at lower legs for about 4-5 months. Patient has draining, burning and sharp pain that is keeping her up. Patient says it feels like someone is pouring alcohol on her legs. She had an appointment at the wound center but had to cancel due to her husbands passing 3 weeks ago.   Patient accompanied by nephew.   The patient has spots, moles and lesions to be evaluated, some may be new or changing and the patient may have concern these could be cancer.   The following portions of the chart were reviewed this encounter and updated as appropriate: medications, allergies, medical history  Review of Systems:  No other skin or systemic complaints except as noted in HPI or Assessment and Plan.  Objective  Well appearing patient in no apparent distress; mood and affect are within normal limits.    A focused examination was performed of the following areas: Legs, arms, face  Relevant exam findings are noted in the Assessment and Plan.  B/L lower legs Distal lower leg and foot peripheral edema (L > R) Lower leg confluent erythematous edematous indurated plaques with purpura and hemorrhagic bullae (L > R) Several ulcerations to fat on right lower distal leg that are draining clear to serous fluid    Assessment & Plan     Venous stasis dermatitis of both lower extremities  Hematoma Left Lower Leg  Solar purpura (HCC)  Venous stasis ulcer of other part of right lower leg with fat layer exposed, unspecified whether varicose veins present (HCC) B/L lower legs  Burning is likely pain from ulcers and exacerbated by patient's application of Aveeno lotion to ulcers  Start TMC 0.1% ointment twice daily to affected areas at lower legs followed with dressing wraps. Avoid applying to face, groin, and axilla. Use as directed. Long-term use can cause thinning of the skin. Keep ulcers covered  with vaseline Wrap legs and elevate multiple times daily as tolerated Follow up with wound care for assistance on wound healing  Topical steroids (such as triamcinolone, fluocinolone, fluocinonide, mometasone, clobetasol, halobetasol, betamethasone, hydrocortisone) can cause thinning and lightening of the skin if they are used for too long in the same area. Your physician has selected the right strength medicine for your problem and area affected on the body. Please use your medication only as directed by your physician to prevent side effects.   Recommend patient reschedule cancelled appointment with Wound Center to be evaluated.   Purpura - Chronic; persistent and recurrent.  Treatable, but not curable. - Violaceous macules and patches - Benign - Related to trauma, age, sun damage and/or use of blood thinners, chronic use of topical and/or oral steroids - Observe - Can use OTC arnica containing moisturizer such as Dermend Bruise Formula if desired - Call for worsening or other concerns   Return if symptoms worsen or fail to improve.  Anise Salvo, RMA, am acting as scribe for Elie Goody, MD .   Documentation: I have reviewed the above documentation for accuracy and completeness, and I agree with the above.  Elie Goody, MD

## 2022-10-30 NOTE — Patient Instructions (Signed)
Recommend rescheduling appointment with Wound Center.   Apply triamcinolone ointment twice daily to affected areas at legs and follow with wraps. Avoid applying to face, groin, and axilla. Use as directed. Long-term use can cause thinning of the skin.  Topical steroids (such as triamcinolone, fluocinolone, fluocinonide, mometasone, clobetasol, halobetasol, betamethasone, hydrocortisone) can cause thinning and lightening of the skin if they are used for too long in the same area. Your physician has selected the right strength medicine for your problem and area affected on the body. Please use your medication only as directed by your physician to prevent side effects.   Due to recent changes in healthcare laws, you may see results of your pathology and/or laboratory studies on MyChart before the doctors have had a chance to review them. We understand that in some cases there may be results that are confusing or concerning to you. Please understand that not all results are received at the same time and often the doctors may need to interpret multiple results in order to provide you with the best plan of care or course of treatment. Therefore, we ask that you please give [place] 2 business days to thoroughly review all your results before contacting the office for clarification. Should we see a critical lab result, you will be contacted sooner.   If You Need Anything After Your Visit  If you have any questions or concerns for your doctor, please call our main line at [phone] and press option 4 to reach your doctor's medical assistant. If no one answers, please leave a voicemail as directed and we will return your call as soon as possible. Messages left after 4 pm will be answered the following business day.   You may also send [place] a message via MyChart. We typically respond to MyChart messages within 1-2 business days.  For prescription refills, please ask your pharmacy to contact our office. Our fax  number is [phone].  If you have an urgent issue when the clinic is closed that cannot wait until the next business day, you can page your doctor at the number below.    Please note that while we do our best to be available for urgent issues outside of office hours, we are not available 24/7.   If you have an urgent issue and are unable to reach [place], you may choose to seek medical care at your doctor's office, retail clinic, urgent care center, or emergency room.  If you have a medical emergency, please immediately call 911 or go to the emergency department.  Pager Numbers  - Dr. Gwen Pounds: [phone]  - Dr. Roseanne Reno: [phone]  - Dr. Katrinka Blazing: [phone]   In the event of inclement weather, please call our main line at [phone] for an update on the status of any delays or closures.  Dermatology Medication Tips: Please keep the boxes that topical medications come in in order to help keep track of the instructions about where and how to use these. Pharmacies typically print the medication instructions only on the boxes and not directly on the medication tubes.   If your medication is too expensive, please contact our office at [phone] option 4 or send [place] a message through MyChart.   We are unable to tell what your co-pay for medications will be in advance as this is different depending on your insurance coverage. However, we may be able to find a substitute medication at lower cost or fill out paperwork to get insurance to cover a needed medication.  If a prior authorization is required to get your medication covered by your insurance company, please allow [place] 1-2 business days to complete this process.  Drug prices often vary depending on where the prescription is filled and some pharmacies may offer cheaper prices.  The website www.goodrx.com contains coupons for medications through different pharmacies. The prices here do not account for what the cost may be with help  from insurance (it may be cheaper with your insurance), but the website can give you the price if you did not use any insurance.  - You can print the associated coupon and take it with your prescription to the pharmacy.  - You may also stop by our office during regular business hours and pick up a GoodRx coupon card.  - If you need your prescription sent electronically to a different pharmacy, notify our office through [facility] or by phone at [phone] option 4.

## 2022-11-02 ENCOUNTER — Inpatient Hospital Stay
Admission: EM | Admit: 2022-11-02 | Discharge: 2022-11-08 | DRG: 299 | Disposition: A | Discharge status: Home Health | Payer: PPO | Attending: Internal Medicine | Admitting: Internal Medicine

## 2022-11-02 ENCOUNTER — Other Ambulatory Visit: Payer: Self-pay

## 2022-11-02 ENCOUNTER — Emergency Department: Payer: PPO

## 2022-11-02 ENCOUNTER — Encounter: Payer: Self-pay | Admitting: Internal Medicine

## 2022-11-02 ENCOUNTER — Encounter: Payer: PPO | Admitting: Physician Assistant

## 2022-11-02 DIAGNOSIS — Z681 Body mass index (BMI) 19 or less, adult: Secondary | ICD-10-CM

## 2022-11-02 DIAGNOSIS — I2510 Atherosclerotic heart disease of native coronary artery without angina pectoris: Secondary | ICD-10-CM | POA: Diagnosis present

## 2022-11-02 DIAGNOSIS — N179 Acute kidney failure, unspecified: Secondary | ICD-10-CM | POA: Diagnosis present

## 2022-11-02 DIAGNOSIS — E785 Hyperlipidemia, unspecified: Secondary | ICD-10-CM | POA: Diagnosis present

## 2022-11-02 DIAGNOSIS — I5032 Chronic diastolic (congestive) heart failure: Secondary | ICD-10-CM | POA: Diagnosis present

## 2022-11-02 DIAGNOSIS — Z79899 Other long term (current) drug therapy: Secondary | ICD-10-CM

## 2022-11-02 DIAGNOSIS — Z79890 Hormone replacement therapy (postmenopausal): Secondary | ICD-10-CM

## 2022-11-02 DIAGNOSIS — D62 Acute posthemorrhagic anemia: Secondary | ICD-10-CM | POA: Diagnosis present

## 2022-11-02 DIAGNOSIS — Z634 Disappearance and death of family member: Secondary | ICD-10-CM

## 2022-11-02 DIAGNOSIS — I87331 Chronic venous hypertension (idiopathic) with ulcer and inflammation of right lower extremity: Principal | ICD-10-CM | POA: Diagnosis present

## 2022-11-02 DIAGNOSIS — H9190 Unspecified hearing loss, unspecified ear: Secondary | ICD-10-CM | POA: Diagnosis present

## 2022-11-02 DIAGNOSIS — R791 Abnormal coagulation profile: Secondary | ICD-10-CM | POA: Diagnosis not present

## 2022-11-02 DIAGNOSIS — E43 Unspecified severe protein-calorie malnutrition: Secondary | ICD-10-CM | POA: Diagnosis present

## 2022-11-02 DIAGNOSIS — E871 Hypo-osmolality and hyponatremia: Secondary | ICD-10-CM | POA: Diagnosis present

## 2022-11-02 DIAGNOSIS — D631 Anemia in chronic kidney disease: Secondary | ICD-10-CM | POA: Diagnosis present

## 2022-11-02 DIAGNOSIS — S8011XA Contusion of right lower leg, initial encounter: Secondary | ICD-10-CM | POA: Diagnosis present

## 2022-11-02 DIAGNOSIS — I480 Paroxysmal atrial fibrillation: Secondary | ICD-10-CM | POA: Insufficient documentation

## 2022-11-02 DIAGNOSIS — X58XXXA Exposure to other specified factors, initial encounter: Secondary | ICD-10-CM | POA: Insufficient documentation

## 2022-11-02 DIAGNOSIS — I4891 Unspecified atrial fibrillation: Secondary | ICD-10-CM | POA: Diagnosis present

## 2022-11-02 DIAGNOSIS — E876 Hypokalemia: Secondary | ICD-10-CM | POA: Diagnosis present

## 2022-11-02 DIAGNOSIS — I5030 Unspecified diastolic (congestive) heart failure: Secondary | ICD-10-CM | POA: Insufficient documentation

## 2022-11-02 DIAGNOSIS — N1832 Chronic kidney disease, stage 3b: Secondary | ICD-10-CM | POA: Diagnosis not present

## 2022-11-02 DIAGNOSIS — E872 Acidosis, unspecified: Secondary | ICD-10-CM | POA: Diagnosis present

## 2022-11-02 DIAGNOSIS — G894 Chronic pain syndrome: Secondary | ICD-10-CM | POA: Diagnosis present

## 2022-11-02 DIAGNOSIS — D649 Anemia, unspecified: Secondary | ICD-10-CM | POA: Diagnosis not present

## 2022-11-02 DIAGNOSIS — S81811A Laceration without foreign body, right lower leg, initial encounter: Secondary | ICD-10-CM | POA: Diagnosis present

## 2022-11-02 DIAGNOSIS — E039 Hypothyroidism, unspecified: Secondary | ICD-10-CM | POA: Diagnosis present

## 2022-11-02 DIAGNOSIS — M79604 Pain in right leg: Secondary | ICD-10-CM | POA: Diagnosis not present

## 2022-11-02 DIAGNOSIS — I482 Chronic atrial fibrillation, unspecified: Secondary | ICD-10-CM | POA: Diagnosis present

## 2022-11-02 DIAGNOSIS — Z8249 Family history of ischemic heart disease and other diseases of the circulatory system: Secondary | ICD-10-CM

## 2022-11-02 DIAGNOSIS — Z888 Allergy status to other drugs, medicaments and biological substances status: Secondary | ICD-10-CM

## 2022-11-02 DIAGNOSIS — Z9049 Acquired absence of other specified parts of digestive tract: Secondary | ICD-10-CM

## 2022-11-02 DIAGNOSIS — Z7901 Long term (current) use of anticoagulants: Secondary | ICD-10-CM

## 2022-11-02 DIAGNOSIS — I959 Hypotension, unspecified: Secondary | ICD-10-CM | POA: Diagnosis not present

## 2022-11-02 DIAGNOSIS — Z85828 Personal history of other malignant neoplasm of skin: Secondary | ICD-10-CM

## 2022-11-02 DIAGNOSIS — Z96642 Presence of left artificial hip joint: Secondary | ICD-10-CM | POA: Diagnosis present

## 2022-11-02 DIAGNOSIS — Z66 Do not resuscitate: Secondary | ICD-10-CM | POA: Diagnosis present

## 2022-11-02 DIAGNOSIS — N183 Chronic kidney disease, stage 3 unspecified: Secondary | ICD-10-CM | POA: Diagnosis present

## 2022-11-02 DIAGNOSIS — Z880 Allergy status to penicillin: Secondary | ICD-10-CM

## 2022-11-02 DIAGNOSIS — L97812 Non-pressure chronic ulcer of other part of right lower leg with fat layer exposed: Secondary | ICD-10-CM | POA: Diagnosis present

## 2022-11-02 DIAGNOSIS — I132 Hypertensive heart and chronic kidney disease with heart failure and with stage 5 chronic kidney disease, or end stage renal disease: Secondary | ICD-10-CM | POA: Diagnosis present

## 2022-11-02 DIAGNOSIS — R238 Other skin changes: Secondary | ICD-10-CM

## 2022-11-02 DIAGNOSIS — I872 Venous insufficiency (chronic) (peripheral): Secondary | ICD-10-CM | POA: Diagnosis not present

## 2022-11-02 DIAGNOSIS — S0012XA Contusion of left eyelid and periocular area, initial encounter: Secondary | ICD-10-CM | POA: Diagnosis present

## 2022-11-02 DIAGNOSIS — N186 End stage renal disease: Secondary | ICD-10-CM | POA: Diagnosis present

## 2022-11-02 DIAGNOSIS — Z7982 Long term (current) use of aspirin: Secondary | ICD-10-CM

## 2022-11-02 LAB — PROTIME-INR
INR: 6.6 (ref 0.8–1.2)
INR: 4.6 (ref 0.8–1.2)
Prothrombin Time: 57.5 s — ABNORMAL HIGH (ref 11.4–15.2)
Prothrombin Time: 43.7 s — ABNORMAL HIGH (ref 11.4–15.2)

## 2022-11-02 LAB — COMPREHENSIVE METABOLIC PANEL
ALT: 17 U/L (ref 0–44)
AST: 30 U/L (ref 15–41)
Albumin: 3.5 g/dL (ref 3.5–5.0)
Alkaline Phosphatase: 91 U/L (ref 38–126)
Anion gap: 11 (ref 5–15)
BUN: 54 mg/dL — ABNORMAL HIGH (ref 8–23)
CO2: 29 mmol/L (ref 22–32)
Calcium: 8.9 mg/dL (ref 8.9–10.3)
Chloride: 97 mmol/L — ABNORMAL LOW (ref 98–111)
Creatinine, Ser: 1.58 mg/dL — ABNORMAL HIGH (ref 0.44–1.00)
GFR, Estimated: 31 mL/min — ABNORMAL LOW (ref >60)
Glucose, Bld: 128 mg/dL — ABNORMAL HIGH (ref 70–99)
Potassium: 3.9 mmol/L (ref 3.5–5.1)
Sodium: 137 mmol/L (ref 135–145)
Total Bilirubin: 1 mg/dL (ref 0.3–1.2)
Total Protein: 7.4 g/dL (ref 6.5–8.1)

## 2022-11-02 LAB — CBC WITH DIFFERENTIAL/PLATELET
Abs Immature Granulocytes: 0.04 10*3/uL (ref 0.00–0.07)
Basophils Absolute: 0.1 10*3/uL (ref 0.0–0.1)
Basophils Relative: 1 %
Eosinophils Absolute: 0 10*3/uL (ref 0.0–0.5)
Eosinophils Relative: 0 %
HCT: 28 % — ABNORMAL LOW (ref 36.0–46.0)
Hemoglobin: 9.1 g/dL — ABNORMAL LOW (ref 12.0–15.0)
Immature Granulocytes: 1 %
Lymphocytes Relative: 13 %
Lymphs Abs: 0.8 10*3/uL (ref 0.7–4.0)
MCH: 31 pg (ref 26.0–34.0)
MCHC: 32.5 g/dL (ref 30.0–36.0)
MCV: 95.2 fL (ref 80.0–100.0)
Monocytes Absolute: 0.7 10*3/uL (ref 0.1–1.0)
Monocytes Relative: 11 %
Neutro Abs: 4.8 10*3/uL (ref 1.7–7.7)
Neutrophils Relative %: 74 %
Platelets: 376 10*3/uL (ref 150–400)
RBC: 2.94 MIL/uL — ABNORMAL LOW (ref 3.87–5.11)
RDW: 15.5 % (ref 11.5–15.5)
WBC: 6.5 10*3/uL (ref 4.0–10.5)
nRBC: 0 % (ref 0.0–0.2)

## 2022-11-02 LAB — TYPE AND SCREEN

## 2022-11-02 LAB — IRON AND TIBC
Iron: 35 ug/dL (ref 28–170)
Saturation Ratios: 10 % — ABNORMAL LOW (ref 10.4–31.8)
TIBC: 342 ug/dL (ref 250–450)
UIBC: 307 ug/dL

## 2022-11-02 LAB — LACTIC ACID, PLASMA: Lactic Acid, Venous: 1.7 mmol/L (ref 0.5–1.9)

## 2022-11-02 LAB — FERRITIN: Ferritin: 79 ng/mL (ref 11–307)

## 2022-11-02 MED ORDER — ONDANSETRON HCL 4 MG/2ML IJ SOLN: 4.0000 mg | Freq: Four times a day (QID) | INTRAMUSCULAR | Status: DC | PRN

## 2022-11-02 MED ORDER — ENSURE ENLIVE PO LIQD
237.0000 mL | Freq: Two times a day (BID) | ORAL | Status: DC
  Administered 2022-11-04 – 2022-11-08 (×4): 237 mL via ORAL

## 2022-11-02 MED ORDER — POLYETHYLENE GLYCOL 3350 17 G PO PACK: 17.0000 g | PACK | Freq: Every day | ORAL | Status: DC | PRN

## 2022-11-02 MED ORDER — FUROSEMIDE 20 MG PO TABS
20.0000 mg | ORAL_TABLET | Freq: Every day | ORAL | Status: DC
  Administered 2022-11-03: 20 mg via ORAL
  Filled 2022-11-02: qty 1

## 2022-11-02 MED ORDER — FE FUM-VIT C-VIT B12-FA 460-60-0.01-1 MG PO CAPS
1.0000 | ORAL_CAPSULE | Freq: Every day | ORAL | Status: DC
  Filled 2022-11-02: qty 1

## 2022-11-02 MED ORDER — MAGNESIUM OXIDE 400 MG PO TABS
400.0000 mg | ORAL_TABLET | Freq: Every day | ORAL | Status: DC
  Administered 2022-11-03 – 2022-11-08 (×6): 400 mg via ORAL
  Filled 2022-11-02 (×12): qty 1

## 2022-11-02 MED ORDER — METOPROLOL SUCCINATE ER 50 MG PO TB24
100.0000 mg | ORAL_TABLET | Freq: Every day | ORAL | Status: DC
  Administered 2022-11-02 – 2022-11-05 (×4): 100 mg via ORAL
  Filled 2022-11-02 (×5): qty 2

## 2022-11-02 MED ORDER — ACETAMINOPHEN 325 MG PO TABS
650.0000 mg | ORAL_TABLET | Freq: Four times a day (QID) | ORAL | Status: DC | PRN
Start: 2022-11-02 — End: 2022-11-08
  Administered 2022-11-03 – 2022-11-07 (×4): 650 mg via ORAL
  Filled 2022-11-02 (×5): qty 2

## 2022-11-02 MED ORDER — VITAMIN K1 10 MG/ML IJ SOLN
1.0000 mg | Freq: Once | INTRAVENOUS | Status: AC
  Administered 2022-11-02: 1 mg via INTRAVENOUS
  Filled 2022-11-02: qty 0.1

## 2022-11-02 MED ORDER — ONDANSETRON HCL 4 MG PO TABS: 4.0000 mg | ORAL_TABLET | Freq: Four times a day (QID) | ORAL | Status: DC | PRN

## 2022-11-02 MED ORDER — LEVOTHYROXINE SODIUM 100 MCG PO TABS
100.0000 ug | ORAL_TABLET | Freq: Every day | ORAL | Status: DC
  Administered 2022-11-03 – 2022-11-08 (×6): 100 ug via ORAL
  Filled 2022-11-02 (×6): qty 1

## 2022-11-02 MED ORDER — ASPIRIN 81 MG PO TBEC
81.0000 mg | DELAYED_RELEASE_TABLET | Freq: Every day | ORAL | Status: DC
  Administered 2022-11-03 – 2022-11-04 (×2): 81 mg via ORAL
  Filled 2022-11-02 (×2): qty 1

## 2022-11-02 MED ORDER — ACETAMINOPHEN 650 MG RE SUPP: 650.0000 mg | Freq: Four times a day (QID) | RECTAL | Status: DC | PRN

## 2022-11-02 MED ORDER — SODIUM CHLORIDE 0.9% FLUSH
3.0000 mL | Freq: Two times a day (BID) | INTRAVENOUS | Status: DC
  Administered 2022-11-02 – 2022-11-06 (×9): 3 mL via INTRAVENOUS

## 2022-11-02 MED ORDER — TRAMADOL HCL 50 MG PO TABS: 50.0000 mg | ORAL_TABLET | Freq: Two times a day (BID) | ORAL | Status: DC | PRN

## 2022-11-02 MED ORDER — SIMVASTATIN 20 MG PO TABS
20.0000 mg | ORAL_TABLET | Freq: Every day | ORAL | Status: DC
  Administered 2022-11-02 – 2022-11-07 (×6): 20 mg via ORAL
  Filled 2022-11-02 (×6): qty 1

## 2022-11-02 NOTE — ED Provider Notes (Signed)
[facility] Provider Note    Event Date/Time   First MD Initiated Contact with Patient 11/02/22 1256     (approximate)   History   Leg Pain   HPI  Elizabeth Fields is a 89 y.o. female PMH of Afib on coumadin, AV insufficiency, HFpEF, who presents to the emergency department from the wound center for concern for bleeding wounds.  Worsening over the past couple of days.  No new trauma.  Denies significant shortness of breath or chest pain.  No nausea, vomiting.  No fever or chills.  No history of DVT.      Physical Exam   Triage Vital Signs: ED Triage Vitals  Encounter Vitals Group     BP 11/02/22 1010 (!) 114/53     Systolic BP Percentile --      Diastolic BP Percentile --      Pulse Rate 11/02/22 1010 94     Resp 11/02/22 1010 16     Temp 11/02/22 1010 97.9 F (36.6 C)     Temp Source 11/02/22 1010 Oral     SpO2 11/02/22 1010 97 %     Weight 11/02/22 1009 84 lb (38.1 kg)     Height 11/02/22 1009 4\' 11"  (1.499 m)     Head Circumference --      Peak Flow --      Pain Score 11/02/22 1014 3     Pain Loc --      Pain Education --      Exclude from Growth Chart --     Most recent vital signs: Vitals:   11/03/22 0517 11/03/22 0759  BP: (!) 118/56 (!) 95/51  Pulse: (!) 101 93  Resp:  16  Temp: 98.1 F (36.7 C) 98.8 F (37.1 C)  SpO2: 100% 100%    Physical Exam Constitutional:      Appearance: She is well-developed.  HENT:     Head: Atraumatic.  Eyes:     Conjunctiva/sclera: Conjunctivae normal.  Cardiovascular:     Rate and Rhythm: Tachycardia present. Rhythm irregular.  Pulmonary:     Effort: No respiratory distress.  Abdominal:     General: There is no distension.  Musculoskeletal:        General: Normal range of motion.     Cervical back: Normal range of motion.  Skin:    General: Skin is warm.     Comments: See picture below.  Intact distal pulses.  No overlying erythema or warmth.  Neurological:     Mental Status: She is  alert. Mental status is at baseline.          IMPRESSION / MDM / ASSESSMENT AND PLAN / ED COURSE  I reviewed the triage vital signs and the nursing notes.  Ddx - chronic venous stasis wounds that are bleeding,, good intact pulses, low suspicion for peripheral arterial disease.  Concern for  supratherapeutic INR given worsening bleeding. EKG  I, Corena Herter, the attending physician, personally viewed and interpreted this ECG.    Afib while on cardiac tele  LABS (all labs ordered are listed, but only abnormal results are displayed) Labs interpreted as -    Labs Reviewed  COMPREHENSIVE METABOLIC PANEL - Abnormal; Notable for the following components:      Result Value   Chloride 97 (*)    Glucose, Bld 128 (*)    BUN 54 (*)    Creatinine, Ser 1.58 (*)    GFR, Estimated 31 (*)  All other components within normal limits  CBC WITH DIFFERENTIAL/PLATELET - Abnormal; Notable for the following components:   RBC 2.94 (*)    Hemoglobin 9.1 (*)    HCT 28.0 (*)    All other components within normal limits  PROTIME-INR - Abnormal; Notable for the following components:   Prothrombin Time 57.5 (*)    INR 6.6 (*)    All other components within normal limits  PROTIME-INR - Abnormal; Notable for the following components:   Prothrombin Time 43.7 (*)    INR 4.6 (*)    All other components within normal limits  IRON AND TIBC - Abnormal; Notable for the following components:   Saturation Ratios 10 (*)    All other components within normal limits  BASIC METABOLIC PANEL - Abnormal; Notable for the following components:   Glucose, Bld 112 (*)    BUN 47 (*)    Creatinine, Ser 1.43 (*)    Calcium 8.8 (*)    GFR, Estimated 35 (*)    All other components within normal limits  CBC - Abnormal; Notable for the following components:   RBC 2.44 (*)    Hemoglobin 7.6 (*)    HCT 23.1 (*)    RDW 15.9 (*)    All other components within normal limits  PROTIME-INR - Abnormal; Notable for the  following components:   Prothrombin Time 32.9 (*)    INR 3.2 (*)    All other components within normal limits  CULTURE, BLOOD (ROUTINE X 2)  LACTIC ACID, PLASMA  FERRITIN  TYPE AND SCREEN  TYPE AND SCREEN  PREPARE RBC (CROSSMATCH)     MDM  Significant drop of hemoglobin.  Type and screened.  Currently does not meet criteria for blood transfusion.  Supratherapeutic INR, no life-threatening bleed at this time, good perfusion and normotensive, given 1 mg IV vitamin K.  Consults hospitalist for admission for acute blood loss anemia, supratherapeutic INR     PROCEDURES:  Critical Care performed: yes  .Critical Care  Performed by: Corena Herter, MD Authorized by: Corena Herter, MD   Critical care provider statement:    Critical care time (minutes):  30   Critical care time was exclusive of:  Separately billable procedures and treating other patients   Critical care was necessary to treat or prevent imminent or life-threatening deterioration of the following conditions:  Circulatory failure   Critical care was time spent personally by me on the following activities:  Development of treatment plan with patient or surrogate, discussions with consultants, evaluation of patient's response to treatment, examination of patient, ordering and review of laboratory studies, ordering and review of radiographic studies, ordering and performing treatments and interventions, pulse oximetry, re-evaluation of patient's condition and review of old charts   Care discussed with: admitting provider     Patient's presentation is most consistent with acute presentation with potential threat to life or bodily function.   MEDICATIONS ORDERED IN ED: Medications  sodium chloride flush (NS) 0.9 % injection 3 mL (3 mLs Intravenous Given 11/03/22 0857)  acetaminophen (TYLENOL) tablet 650 mg (650 mg Oral Given 11/03/22 0858)    Or  acetaminophen (TYLENOL) suppository 650 mg ( Rectal See Alternative  11/03/22 0858)  ondansetron (ZOFRAN) tablet 4 mg (has no administration in time range)    Or  ondansetron (ZOFRAN) injection 4 mg (has no administration in time range)  polyethylene glycol (MIRALAX / GLYCOLAX) packet 17 g (has no administration in time range)  furosemide (LASIX) tablet 20 mg (20  mg Oral Given 11/03/22 0856)  levothyroxine (SYNTHROID) tablet 100 mcg (100 mcg Oral Given 11/03/22 0519)  traMADol (ULTRAM) tablet 50 mg (has no administration in time range)  metoprolol succinate (TOPROL-XL) 24 hr tablet 100 mg (100 mg Oral Given 11/03/22 0856)  magnesium oxide (MAG-OX) tablet 400 mg (400 mg Oral Given 11/03/22 0855)  simvastatin (ZOCOR) tablet 20 mg (20 mg Oral Given 11/02/22 2253)  feeding supplement (ENSURE ENLIVE / ENSURE PLUS) liquid 237 mL (237 mLs Oral Not Given 11/03/22 0858)  Fe Fum-Vit C-Vit B12-FA (TRIGELS-F FORTE) capsule 1 capsule (1 capsule Oral Not Given 11/03/22 0855)  aspirin EC tablet 81 mg (81 mg Oral Given 11/03/22 0856)  0.9 %  sodium chloride infusion (Manually program via Guardrails IV Fluids) (has no administration in time range)  acetaminophen (TYLENOL) tablet 650 mg (has no administration in time range)  phytonadione (VITAMIN K) 1 mg in dextrose 5 % 50 mL IVPB (0 mg Intravenous Stopped 11/02/22 1636)    FINAL CLINICAL IMPRESSION(S) / ED DIAGNOSES   Final diagnoses:  Supratherapeutic INR  Anemia, unspecified type     Rx / DC Orders   ED Discharge Orders     None        Note:  This document was prepared using Dragon voice recognition software and may include unintentional dictation errors.   Corena Herter, MD 11/03/22 1348

## 2022-11-02 NOTE — H&P (Signed)
History and Physical    Patient: Elizabeth Fields XBJ:[id number] DOB: 1931-08-02 DOA: 11/02/2022 DOS: the patient was seen and examined on 11/02/2022 PCP: Gracelyn Nurse, MD  Patient coming from: Home  Chief Complaint:  Chief Complaint  Patient presents with   Leg Pain   HPI: JAQUITTA Fields is a 89 y.o. female with medical history significant of chronic venous stasis dermatitis with ulcers, atrial fibrillation on warfarin, aortic valve insufficiency, CAD, CKD stage IIIb, HFpEF, who presents to the ED due to leg pain.  [name] states that she was in her usual state of health when she went to the wound care center today and they recommended she come here due to bleeding from her ulcers.  She states that the bleeding started about 3 days ago.  She is not experiencing any more pain than usual.  She denies any other symptoms at this time including dizziness, shortness of breath or palpitations.  She notes that she takes warfarin daily, but occasionally takes it a few hours late given her husband passed away 3-4 weeks ago and she has been distracted due to this.  If it gets too close to the time of the next dose, she will hold her warfarin.  She is uncertain if there have been any dietary changes that would have caused her INR to go up.  She notes that this has happened before though.  ED course: On arrival to the ED, patient was normotensive at 114/53 with heart rate of 94.  She was saturating at 97% on room air.  She was afebrile at 97.9.  Initial workup notable for hemoglobin of 9.1, BUN 54, creatinine 1.58 with GFR of 31.  Chest x-ray was obtained with no acute changes.  Patient given IV vitamin K and TRH contacted for admission.  Review of Systems: As mentioned in the history of present illness. All other systems reviewed and are negative.  Past Medical History:  Diagnosis Date   Actinic keratosis    Actinic keratosis 11/02/2019   left calf, bx proven   Allergy    Atrial fibrillation (HCC)     CHF (congestive heart failure) (HCC)    History of squamous cell carcinoma    Hyperlipidemia    Hypertension    Renal insufficiency    Squamous cell carcinoma of skin 02/25/2017   Right cheek. KA type.   Thyroid disease    Past Surgical History:  Procedure Laterality Date   ANTERIOR APPROACH HEMI HIP ARTHROPLASTY Left 02/12/2019   Procedure: ANTERIOR APPROACH HEMI HIP ARTHROPLASTY;  Surgeon: Kennedy Bucker, MD;  Location: [facility] ORS;  Service: Orthopedics;  Laterality: Left;   CHOLECYSTECTOMY     HIP SURGERY     Social History:  reports that she has never smoked. She has never used smokeless tobacco. She reports that she does not drink alcohol and does not use drugs.  Allergies  Allergen Reactions   Fosamax [Alendronate Sodium] Nausea And Vomiting   Other Rash   Penicillins Nausea And Vomiting and Palpitations    Family History  Problem Relation Age of Onset   Breast cancer Mother 37   Heart disease Father     Prior to Admission medications   Medication Sig Start Date End Date Taking? Authorizing Provider  acetaminophen (TYLENOL) 500 MG tablet Take 1-2 tablets (500-1,000 mg total) by mouth every 8 (eight) hours as needed for mild pain or moderate pain. 02/14/19   Evon Slack, PA-C  aspirin EC 81 MG tablet Take 81  mg by mouth daily. Swallow whole.    [provider]  Cholecalciferol 50 MCG (2000 UT) CAPS Take 2,000 Units by mouth daily.    [provider]  doxepin (SINEQUAN) 10 MG capsule Take 10 mg by mouth daily.    [provider]  esomeprazole (NEXIUM) 40 MG capsule Take 40 mg by mouth daily at 12 noon.    [provider]  feeding supplement, ENSURE ENLIVE, (ENSURE ENLIVE) LIQD Take 237 mLs by mouth 2 (two) times daily between meals. 02/18/19   Pennie Banter, DO  ferrous fumarate-b12-vitamic C-folic acid (TRINSICON / FOLTRIN) capsule Take 1 capsule by mouth 2 (two) times daily. 02/18/19   Pennie Banter, DO  furosemide (LASIX) 20 MG  tablet Take 1 tablet (20 mg total) by mouth daily. 02/19/19   Pennie Banter, DO  levothyroxine (SYNTHROID, LEVOTHROID) 100 MCG tablet Take 100 mcg by mouth daily before breakfast.    [provider]  magnesium oxide (MAG-OX) 400 MG tablet Take 400 mg by mouth daily.    [provider]  metoprolol succinate (TOPROL-XL) 50 MG 24 hr tablet Take 100 mg by mouth daily. Take with or immediately following a meal.    [provider]  simvastatin (ZOCOR) 20 MG tablet Take 20 mg by mouth daily.    [provider]  traMADol Janean Sark) 50 MG tablet Take by mouth. 07/27/19   [provider]  triamcinolone ointment (KENALOG) 0.1 % Apply 1 Application topically 2 (two) times daily as needed (Rash). To affected areas at legs. Avoid applying to face, groin, and axilla. Use as directed. Long-term use can cause thinning of the skin. 10/30/22   Elie Goody, MD  warfarin (COUMADIN) 4 MG tablet Take 4 mg by mouth daily.    [provider]    Physical Exam: Vitals:   11/02/22 1014 11/02/22 1330 11/02/22 1400 11/02/22 1430  BP:  (!) 112/53 (!) 110/56 (!) 110/56  Pulse:  87 88 90  Resp:    16  Temp:      TempSrc:      SpO2:  94% 100% 100%  Weight: 38.1 kg     Height: 4\' 11"  (1.499 m)      Physical Exam Vitals and nursing note reviewed.  Constitutional:      General: She is not in acute distress.    Appearance: She is normal weight.  HENT:     Head: Normocephalic and atraumatic.     Mouth/Throat:     Mouth: Mucous membranes are moist.     Pharynx: Oropharynx is clear.  Eyes:     Conjunctiva/sclera: Conjunctivae normal.     Pupils: Pupils are equal, round, and reactive to light.  Cardiovascular:     Rate and Rhythm: Normal rate. Rhythm irregular.  Pulmonary:     Effort: Pulmonary effort is normal. No respiratory distress.     Breath sounds: Normal breath sounds. No wheezing, rhonchi or rales.  Abdominal:     General: Bowel sounds are normal.  There is no distension.     Palpations: Abdomen is soft.     Tenderness: There is no abdominal tenderness. There is no guarding.  Skin:    General: Skin is warm and dry.     Comments: Bilateral lower extremities wrapped with Ace bandages on personal examination.  Please see pictures below for further details, extensive bruising of the bilateral lower extremities with underlying venous stasis changes.  On the right leg, there are several bullae with  hemorrhagic changes.  Neurological:     Mental Status: She is alert and oriented to person, place, and time.  Psychiatric:        Mood and Affect: Mood normal.        Behavior: Behavior normal.         Data Reviewed: CBC with WBC of 6.5, hemoglobin of 9.1, MCV of 95, platelets of 376 CMP with sodium of 137, potassium 3.9, glucose 128, BUN 54, creatinine 1.58, AST 30, ALT 17 and GFR 31 Lactic acid 1.7 INR 6.6  DG Chest 2 View  Result Date: 11/02/2022 CLINICAL DATA:  Suspected Sepsis.  Right leg pain. EXAM: CHEST - 2 VIEW COMPARISON:  02/14/2019. FINDINGS: Bilateral lung fields are clear. Bilateral costophrenic angles are clear. Normal cardio-mediastinal silhouette. No acute osseous abnormalities. Moderate anterior wedging deformity of upper thoracic vertebrae and mild-to-moderate anterior wedging deformity of lower thoracic vertebrae are unchanged since the prior studies from 02/03/2020. The soft tissues are within normal limits. IMPRESSION: *No acute cardiopulmonary abnormality. *Stable compression deformities of thoracic vertebra, unchanged since the prior study from 02/03/2020. Electronically Signed   By: Jules Schick M.D.   On: 11/02/2022 11:02    Results are pending, will review when available.  Assessment and Plan:  * Supratherapeutic INR Patient is presenting with bleeding of pre-existing ulcers on her legs in the setting of supratherapeutic INR of 6.6. Hemodynamically stable at this time.   - S/p one-time dose of IV vitamin K 1  mg - Repeat INR this evening - If remains markedly elevated or bleeding continues, can consider an additional dose  Venous stasis dermatitis of both lower extremities History of chronic venous stasis ulcers of bilateral lower extremities, for which she follows with both dermatology and the wound center.  There is now hemorrhagic bullae, (previously serous per chart review), due to elevated INR.  - Apply pressure dressing to tamponade any further oozing  Acute on chronic anemia History of anemia of chronic renal disease now with a hemoglobin of 9.0 from 11.0 about 2 months prior.  Although patient is having bleeding from her leg ulcers, I suspect this was a slow decline.  - Daily CBC while admitted - Transfuse for hemoglobin less than 7 - Iron panel  Atrial fibrillation (HCC) - Hold home warfarin - Continue home metoprolol  Chronic kidney disease, stage III (moderate) (HCC) History of CKD stage IIIb with creatinine ranging between 1.2 and 1.4, currently slightly increased at 1.58.  - Recheck BMP in the AM  (HFpEF) heart failure with preserved ejection fraction (HCC) Patient has a history of HFpEF with last EF of 50%, last evaluated in 2021. Euvolemic at this time.    - Continue home regimen  Hypothyroidism - Continue home Synthroid  Chronic pain syndrome - Continue home regimen  Advance Care Planning:   Code Status: Limited: Do not attempt resuscitation (DNR) -DNR-LIMITED -Do Not Intubate/DNI  Patient states that she would never want to be maintained on life support or go through the difficulty of being resuscitated.  These are discussions she had with her husband before his passing.  She is grateful that her husband was able to pass peacefully, and she wishes the same for herself.  Consults: None  Family Communication: Patient's nephew updated at bedside  Severity of Illness: The appropriate patient status for this patient is OBSERVATION. Observation status is judged to be  reasonable and necessary in order to provide the required intensity of service to ensure the patient's safety. The patient's presenting  symptoms, physical exam findings, and initial radiographic and laboratory data in the context of their medical condition is felt to place them at decreased risk for further clinical deterioration. Furthermore, it is anticipated that the patient will be medically stable for discharge from the hospital within 2 midnights of admission.   Author: Verdene Lennert, MD 11/02/2022 4:22 PM  For on call review [email or web address].

## 2022-11-02 NOTE — Assessment & Plan Note (Signed)
-   Continue home regimen 

## 2022-11-02 NOTE — ED Triage Notes (Signed)
Pt here with right leg pain. Pt is currently being seen at the wound care center for leg ulcers and now is having bleeding issues. Pt also has new dark circles around her eyes that family states may be d/t blood levels being too high. Pt stable in triage.

## 2022-11-02 NOTE — ED Notes (Signed)
New dressing applied to leg wound

## 2022-11-02 NOTE — Assessment & Plan Note (Deleted)
History of CKD stage IIIb with creatinine ranging between 1.2 and 1.4, currently slightly increased at 1.58.  - Recheck BMP in the AM

## 2022-11-02 NOTE — Assessment & Plan Note (Signed)
History of anemia of chronic renal disease now with a hemoglobin of 9.0 from 11.0 about 2 months prior.  Although patient is having bleeding from her leg ulcers, I suspect this was a slow decline.  - Daily CBC while admitted - Transfuse for hemoglobin less than 7 - Iron panel

## 2022-11-02 NOTE — Assessment & Plan Note (Addendum)
Received 1 dose of vitamin K in the emergency room.  Today's INR 3.2.  Tomorrow's INR probably will be low.

## 2022-11-02 NOTE — Assessment & Plan Note (Addendum)
History of chronic venous stasis ulcers of bilateral lower extremities, for which she follows with both dermatology and the wound center.  There is now hemorrhagic bullae.  Patient had a pressure dressing on today.  Will reassess tomorrow.

## 2022-11-02 NOTE — Assessment & Plan Note (Signed)
Chronic in nature.  Warfarin on hold secondary to anemia and bleeding and venous stasis ulcers.  Continue metoprolol.

## 2022-11-02 NOTE — Assessment & Plan Note (Signed)
Continue levothyroxine 

## 2022-11-02 NOTE — Assessment & Plan Note (Signed)
Patient has a history of HFpEF with last EF of 50%, last evaluated in 2021. Euvolemic at this time.  -Patient on low-dose Lasix, Toprol

## 2022-11-03 DIAGNOSIS — N186 End stage renal disease: Secondary | ICD-10-CM | POA: Diagnosis not present

## 2022-11-03 DIAGNOSIS — S0012XA Contusion of left eyelid and periocular area, initial encounter: Secondary | ICD-10-CM | POA: Diagnosis not present

## 2022-11-03 DIAGNOSIS — I9589 Other hypotension: Secondary | ICD-10-CM | POA: Diagnosis not present

## 2022-11-03 DIAGNOSIS — D631 Anemia in chronic kidney disease: Secondary | ICD-10-CM | POA: Diagnosis not present

## 2022-11-03 DIAGNOSIS — I87331 Chronic venous hypertension (idiopathic) with ulcer and inflammation of right lower extremity: Secondary | ICD-10-CM | POA: Diagnosis not present

## 2022-11-03 DIAGNOSIS — N1832 Chronic kidney disease, stage 3b: Secondary | ICD-10-CM | POA: Diagnosis not present

## 2022-11-03 DIAGNOSIS — N179 Acute kidney failure, unspecified: Secondary | ICD-10-CM | POA: Diagnosis not present

## 2022-11-03 DIAGNOSIS — N189 Chronic kidney disease, unspecified: Secondary | ICD-10-CM | POA: Diagnosis not present

## 2022-11-03 DIAGNOSIS — I872 Venous insufficiency (chronic) (peripheral): Secondary | ICD-10-CM | POA: Diagnosis not present

## 2022-11-03 DIAGNOSIS — R791 Abnormal coagulation profile: Secondary | ICD-10-CM

## 2022-11-03 DIAGNOSIS — I480 Paroxysmal atrial fibrillation: Secondary | ICD-10-CM | POA: Diagnosis not present

## 2022-11-03 DIAGNOSIS — E871 Hypo-osmolality and hyponatremia: Secondary | ICD-10-CM | POA: Diagnosis not present

## 2022-11-03 DIAGNOSIS — E43 Unspecified severe protein-calorie malnutrition: Secondary | ICD-10-CM | POA: Diagnosis not present

## 2022-11-03 DIAGNOSIS — E876 Hypokalemia: Secondary | ICD-10-CM | POA: Diagnosis not present

## 2022-11-03 DIAGNOSIS — I482 Chronic atrial fibrillation, unspecified: Secondary | ICD-10-CM | POA: Diagnosis not present

## 2022-11-03 DIAGNOSIS — H9190 Unspecified hearing loss, unspecified ear: Secondary | ICD-10-CM | POA: Diagnosis not present

## 2022-11-03 DIAGNOSIS — G894 Chronic pain syndrome: Secondary | ICD-10-CM

## 2022-11-03 DIAGNOSIS — I132 Hypertensive heart and chronic kidney disease with heart failure and with stage 5 chronic kidney disease, or end stage renal disease: Secondary | ICD-10-CM | POA: Diagnosis not present

## 2022-11-03 DIAGNOSIS — E785 Hyperlipidemia, unspecified: Secondary | ICD-10-CM | POA: Diagnosis not present

## 2022-11-03 DIAGNOSIS — D62 Acute posthemorrhagic anemia: Secondary | ICD-10-CM

## 2022-11-03 DIAGNOSIS — I2510 Atherosclerotic heart disease of native coronary artery without angina pectoris: Secondary | ICD-10-CM | POA: Diagnosis not present

## 2022-11-03 DIAGNOSIS — E872 Acidosis, unspecified: Secondary | ICD-10-CM | POA: Diagnosis not present

## 2022-11-03 DIAGNOSIS — I5032 Chronic diastolic (congestive) heart failure: Secondary | ICD-10-CM

## 2022-11-03 DIAGNOSIS — Z681 Body mass index (BMI) 19 or less, adult: Secondary | ICD-10-CM | POA: Diagnosis not present

## 2022-11-03 DIAGNOSIS — E038 Other specified hypothyroidism: Secondary | ICD-10-CM | POA: Diagnosis not present

## 2022-11-03 DIAGNOSIS — L97812 Non-pressure chronic ulcer of other part of right lower leg with fat layer exposed: Secondary | ICD-10-CM | POA: Diagnosis not present

## 2022-11-03 DIAGNOSIS — Z96642 Presence of left artificial hip joint: Secondary | ICD-10-CM | POA: Diagnosis not present

## 2022-11-03 DIAGNOSIS — Z66 Do not resuscitate: Secondary | ICD-10-CM | POA: Diagnosis not present

## 2022-11-03 DIAGNOSIS — E039 Hypothyroidism, unspecified: Secondary | ICD-10-CM | POA: Diagnosis not present

## 2022-11-03 LAB — CBC
HCT: 23.1 % — ABNORMAL LOW (ref 36.0–46.0)
Hemoglobin: 7.6 g/dL — ABNORMAL LOW (ref 12.0–15.0)
MCH: 31.1 pg (ref 26.0–34.0)
MCHC: 32.9 g/dL (ref 30.0–36.0)
MCV: 94.7 fL (ref 80.0–100.0)
Platelets: 338 10*3/uL (ref 150–400)
RBC: 2.44 MIL/uL — ABNORMAL LOW (ref 3.87–5.11)
RDW: 15.9 % — ABNORMAL HIGH (ref 11.5–15.5)
WBC: 7.9 10*3/uL (ref 4.0–10.5)
nRBC: 0 % (ref 0.0–0.2)

## 2022-11-03 LAB — BASIC METABOLIC PANEL
Anion gap: 12 (ref 5–15)
BUN: 47 mg/dL — ABNORMAL HIGH (ref 8–23)
CO2: 28 mmol/L (ref 22–32)
Calcium: 8.8 mg/dL — ABNORMAL LOW (ref 8.9–10.3)
Chloride: 98 mmol/L (ref 98–111)
Creatinine, Ser: 1.43 mg/dL — ABNORMAL HIGH (ref 0.44–1.00)
GFR, Estimated: 35 mL/min — ABNORMAL LOW (ref >60)
Glucose, Bld: 112 mg/dL — ABNORMAL HIGH (ref 70–99)
Potassium: 4 mmol/L (ref 3.5–5.1)
Sodium: 138 mmol/L (ref 135–145)

## 2022-11-03 LAB — PREPARE RBC (CROSSMATCH)

## 2022-11-03 LAB — PROTIME-INR
INR: 3.2 — ABNORMAL HIGH (ref 0.8–1.2)
Prothrombin Time: 32.9 s — ABNORMAL HIGH (ref 11.4–15.2)

## 2022-11-03 MED ORDER — ACETAMINOPHEN 325 MG PO TABS
650.0000 mg | ORAL_TABLET | Freq: Once | ORAL | Status: AC
  Administered 2022-11-03: 650 mg via ORAL

## 2022-11-03 MED ORDER — SODIUM CHLORIDE 0.9% IV SOLUTION: Freq: Once | INTRAVENOUS | Status: AC

## 2022-11-03 NOTE — Evaluation (Signed)
Physical Therapy Evaluation Patient Details Name: Elizabeth Fields MRN: [id number] DOB: October 31, 1931 Today's Date: 11/03/2022  History of Present Illness  Elizabeth Fields is a 89 y.o. female with medical history significant of chronic venous stasis dermatitis with ulcers, atrial fibrillation on warfarin, aortic valve insufficiency, CAD, CKD stage IIIb, HFpEF, who presents to the ED due to leg pain.  Clinical Impression   Pt received seated in recliner upon arrival to room and pt agreeable to therapy.  Pt performed transfer with minA to standing from recliner.  Pt able to ambulate down to the Pt gym and perform stair training in order to replicate setup at home.  Pt performed well, with CGA for safety.  Pt then took brief seated rest break before continuing to ambulate back to room.  Pt noted to have saturated dressings and nurse notified.  Pt currently lives alone but has several neighbors that check in frequently on her and feels safe in returning home.  Pt left in recliner with all needs met and call bell within reach.        If plan is discharge home, recommend the following: A little help with walking and/or transfers;A little help with bathing/dressing/bathroom;Help with stairs or ramp for entrance;Assist for transportation   Can travel by private vehicle        Equipment Recommendations None recommended by PT  Recommendations for Other Services       Functional Status Assessment Patient has had a recent decline in their functional status and demonstrates the ability to make significant improvements in function in a reasonable and predictable amount of time.     Precautions / Restrictions        Mobility  Bed Mobility               General bed mobility comments: pt upright in recliner    Transfers Overall transfer level: Needs assistance Equipment used: Fields walker (2 wheels) Transfers: Sit to/from Stand Sit to Stand: Min assist           General transfer comment:  Pt required minA from recliner and verbal cues for proper hand placement.    Ambulation/Gait Ambulation/Gait assistance: Contact guard assist Gait Distance (Feet): 160 Feet Assistive device: Fields walker (2 wheels) Gait Pattern/deviations: Trunk flexed, Step-through pattern Gait velocity: decreased     General Gait Details: pt with forward flexed posture and small steps.  Pt notes that when she looks up, she loses her balance.  Stairs Stairs: Yes Stairs assistance: Contact guard assist Stair Management: Two rails Number of Stairs: 4 General stair comments: Pt with good safety when ascending/descending stairs.  Wheelchair Mobility     Tilt Bed    Modified Rankin (Stroke Patients Only)       Balance Overall balance assessment: Needs assistance Sitting-balance support: No upper extremity supported, Feet unsupported Sitting balance-[name]: Good     Standing balance support: Bilateral upper extremity supported, During functional activity, Reliant on assistive device for balance Standing balance-[name]: Fair                               Pertinent Vitals/Pain Pain Assessment Pain Assessment: 0-10 Pain Score: 4  Pain Location: spine Pain Descriptors / Indicators: Aching, Dull Pain Intervention(s): Limited activity within patient's tolerance, Monitored during session    Home Living Family/patient expects to be discharged to:: Private residence Living Arrangements: Alone Available Help at Discharge: Family;Neighbor;Available PRN/intermittently Type of Home: House  Home Access: Stairs to enter Entrance Stairs-Rails: Left Entrance Stairs-Number of Steps: 2-3   Home Layout: One level Home Equipment: Agricultural consultant (2 wheels);Cane - single point      Prior Function Prior Level of Function : Needs assist               ADLs Comments: Pt unable to drive, so her neighbors have been getting her groceries.     Extremity/Trunk Assessment    Upper Extremity Assessment Upper Extremity Assessment: Generalized weakness    Lower Extremity Assessment Lower Extremity Assessment: Generalized weakness    Cervical / Trunk Assessment Cervical / Trunk Assessment: Kyphotic  Communication   Communication Communication: Hearing impairment (B hearing aides)  Cognition Arousal: Alert   Overall Cognitive Status: Within Functional Limits for tasks assessed                                          General Comments      Exercises     Assessment/Plan    PT Assessment Patient needs continued PT services  PT Problem List Decreased strength;Decreased range of motion;Decreased activity tolerance;Decreased balance;Decreased mobility;Decreased safety awareness       PT Treatment Interventions DME instruction;Stair training;Functional mobility training;Therapeutic activities;Therapeutic exercise;Balance training;Gait training;Neuromuscular re-education    PT Goals (Current goals can be found in the Care Plan section)  Acute Rehab PT Goals Patient Stated Goal: to go back home. PT Goal Formulation: With patient Time For Goal Achievement: 11/17/22 Potential to Achieve Goals: Fair    Frequency Min 1X/week     Co-evaluation               AM-PAC PT "6 Clicks" Mobility  Outcome Measure Help needed turning from your back to your side while in a flat bed without using bedrails?: A Little Help needed moving from lying on your back to sitting on the side of a flat bed without using bedrails?: A Little Help needed moving to and from a bed to a chair (including a wheelchair)?: A Little Help needed standing up from a chair using your arms (e.g., wheelchair or bedside chair)?: A Little Help needed to walk in hospital room?: A Little Help needed climbing 3-5 steps with a railing? : A Little 6 Click Score: 18    End of Session Equipment Utilized During Treatment: Gait belt Activity Tolerance: Patient tolerated  treatment well Patient left: in chair;with call bell/phone within reach;with chair alarm set;with nursing/sitter in room Nurse Communication: Mobility status PT Visit Diagnosis: Unsteadiness on feet (R26.81);Other abnormalities of gait and mobility (R26.89);Muscle weakness (generalized) (M62.81);History of falling (Z91.81);Difficulty in walking, not elsewhere classified (R26.2)    Time: 5732-2025 PT Time Calculation (min) (ACUTE ONLY): 17 min   Charges:   PT Evaluation $PT Eval Low Complexity: 1 Low PT Treatments $Therapeutic Activity: 8-22 mins PT General Charges $$ ACUTE PT VISIT: 1 Visit         Nolon Bussing, PT, DPT Physical Therapist - [facility]  11/03/22, 4:57 PM

## 2022-11-03 NOTE — Assessment & Plan Note (Signed)
Patient received 1 unit of blood and IV iron while here.  Today's hemoglobin is 9.4.

## 2022-11-03 NOTE — Progress Notes (Signed)
Progress Note   Patient: Elizabeth Fields:[id number] DOB: 1931-05-29 DOA: 11/02/2022     0 DOS: the patient was seen and examined on 11/03/2022   Brief hospital course: 89 year old female past medical history of chronic venous stasis dermatitis with ulcers, atrial fibrillation on warfarin, severe tricuspid regurgitation, mild aortic regurgitation and mitral regurgitation.  She was sent in from the wound care center for bleeding lower extremity wounds.  In the ER, they placed a pressure wrap.  INR 6.6 on presentation patient given a dose of vitamin K.  10/26.  Hemoglobin drifted down to 7.6.  (Back in August hemoglobin was 11.0).  Will give a unit of blood.  Continue to hold Coumadin.  Today's INR 3.2.  Will need to look at her wounds tomorrow to determine on what to do with Coumadin.  Assessment and Plan: * Acute blood loss anemia Hemoglobin drifted down to 7.5.  Yesterday 9.1.  Back in August was 11.1.  Benefits and risk of transfusion explained to patient.  Transfuse 1 unit of packed red blood cells today.  Reassess hemoglobin tomorrow.  Supratherapeutic INR Received 1 dose of vitamin K in the emergency room.  Today's INR 3.2.  Tomorrow's INR probably will be low.  Venous stasis dermatitis of both lower extremities History of chronic venous stasis ulcers of bilateral lower extremities, for which she follows with both dermatology and the wound center.  There is now hemorrhagic bullae.  Patient had a pressure dressing on today.  Will reassess tomorrow.  Atrial fibrillation (HCC) Chronic in nature.  Warfarin on hold secondary to anemia and bleeding and venous stasis ulcers.  Continue metoprolol.  Chronic kidney disease, stage III (moderate) (HCC) CKD stage IIIb.  Creatinine 1.43 with a GFR of 35  (HFpEF) heart failure with preserved ejection fraction (HCC) Patient has a history of HFpEF with last EF of 50%, last evaluated in 2021. Euvolemic at this time.  -Patient on low-dose Lasix,  Toprol  Hypothyroidism Continue levothyroxine  Chronic pain syndrome On tramadol        Subjective: Patient asked me to go home today.  With her hemoglobin being low I convinced her to do a unit of blood.  Continue to hold Coumadin.  Came in with bleeding lower extremities and a supratherapeutic INR.  Physical Exam: Vitals:   11/03/22 0517 11/03/22 0759 11/03/22 1451 11/03/22 1523  BP: (!) 118/56 (!) 95/51 (!) 113/54 (!) 108/52  Pulse: (!) 101 93 84 86  Resp:  16 18 18   Temp: 98.1 F (36.7 C) 98.8 F (37.1 C) 98 F (36.7 C) 97.6 F (36.4 C)  TempSrc:   Oral Oral  SpO2: 100% 100% 98% 100%  Weight:      Height:       Physical Exam HENT:     Head: Normocephalic.     Mouth/Throat:     Pharynx: No oropharyngeal exudate.  Eyes:     General: Lids are normal.     Comments: Pale conjunctiva  Cardiovascular:     Rate and Rhythm: Normal rate. Rhythm irregularly irregular.     Heart sounds: Normal heart sounds, S1 normal and S2 normal.  Pulmonary:     Breath sounds: No decreased breath sounds, wheezing, rhonchi or rales.  Abdominal:     Palpations: Abdomen is soft.     Tenderness: There is no abdominal tenderness.  Musculoskeletal:     Right lower leg: No swelling.     Left lower leg: No swelling.  Skin:  General: Skin is warm.     Comments: Bilateral lower extremities covered with pressure dressing.  Neurological:     Mental Status: She is alert.     Data Reviewed: Creatinine 1.43, hemoglobin 7.6, INR 3.2  Family Communication: Updated nephew on the phone  Disposition: Status is: Inpatient Remains inpatient appropriate because: Transfusing a unit of blood today  Planned Discharge Destination: Home with Home Health    Time spent: 28 minutes  Author: Alford Highland, MD 11/03/2022 4:09 PM  For on call review [email or web address].

## 2022-11-03 NOTE — Plan of Care (Signed)
  Problem: Education: Goal: Knowledge of General Education information will improve Description: Including pain rating scale, medication(s)/side effects and non-pharmacologic comfort measures Outcome: Progressing   Problem: Health Behavior/Discharge Planning: Goal: Ability to manage health-related needs will improve Outcome: Progressing   Problem: Clinical Measurements: Goal: Ability to maintain clinical measurements within normal limits will improve Outcome: Progressing Goal: Will remain free from infection Outcome: Progressing Goal: Diagnostic test results will improve Outcome: Progressing Goal: Respiratory complications will improve Outcome: Progressing Goal: Cardiovascular complication will be avoided Outcome: Progressing   Problem: Activity: Goal: Risk for activity intolerance will decrease Outcome: Progressing  Pt oob to chair; ambulated around the nurse's station by the Physical Therapist  Problem: Nutrition: Goal: Adequate nutrition will be maintained Outcome: Progressing  Poor appetite per pt Problem: Coping: Goal: Level of anxiety will decrease Outcome: Progressing  Pt verbalized that her spouse passed away within the last month Problem: Elimination: Goal: Will not experience complications related to bowel motility Outcome: Progressing Goal: Will not experience complications related to urinary retention Outcome: Progressing   Problem: Pain Management: Goal: General experience of comfort will improve Outcome: Progressing   Problem: Safety: Goal: Ability to remain free from injury will improve Outcome: Progressing   Problem: Skin Integrity: Goal: Risk for impaired skin integrity will decrease Outcome: Progressing

## 2022-11-03 NOTE — Hospital Course (Signed)
89 year old female past medical history of chronic venous stasis dermatitis with ulcers, atrial fibrillation on warfarin, severe tricuspid regurgitation, mild aortic regurgitation and mitral regurgitation.  She was sent in from the wound care center for bleeding lower extremity wounds.  In the ER, they placed a pressure wrap.  INR 6.6 on presentation patient given a dose of vitamin K.  10/26.  Hemoglobin drifted down to 7.6.  (Back in August hemoglobin was 11.0).  Will give a unit of blood.  Continue to hold Coumadin.  Today's INR 3.2.  Will need to look at her wounds tomorrow to determine on what to do with Coumadin. 10/27.  Hemoglobin up to 10.9 after transfusion.  INR 2.5.  With unwrapping the dressing still has some bleeding right lower extremity.  Will continue to hold Coumadin apply pressure dressing and reevaluate tomorrow. 10/28.  Hemoglobin 9.0, creatinine 1.07.  INR 2.2.  Continue to hold Coumadin.  Did not see any active bleeding on the anterior skin tear but did have a little oozing from the large bullae. 10/29.  Hemoglobin 9.4, creatinine 0.95, INR 1.9.  Continue to hold Coumadin.  Patient's nephew very concerned that the patient lives alone and unable not be able to take care of herself.  He is looking into more care at home besides the home health.

## 2022-11-03 NOTE — Plan of Care (Signed)

## 2022-11-04 DIAGNOSIS — I482 Chronic atrial fibrillation, unspecified: Secondary | ICD-10-CM | POA: Diagnosis not present

## 2022-11-04 DIAGNOSIS — D62 Acute posthemorrhagic anemia: Secondary | ICD-10-CM | POA: Diagnosis not present

## 2022-11-04 DIAGNOSIS — I872 Venous insufficiency (chronic) (peripheral): Secondary | ICD-10-CM | POA: Diagnosis not present

## 2022-11-04 DIAGNOSIS — R791 Abnormal coagulation profile: Secondary | ICD-10-CM | POA: Diagnosis not present

## 2022-11-04 LAB — BASIC METABOLIC PANEL
Anion gap: 9 (ref 5–15)
BUN: 48 mg/dL — ABNORMAL HIGH (ref 8–23)
CO2: 28 mmol/L (ref 22–32)
Calcium: 8.8 mg/dL — ABNORMAL LOW (ref 8.9–10.3)
Chloride: 99 mmol/L (ref 98–111)
Creatinine, Ser: 1.69 mg/dL — ABNORMAL HIGH (ref 0.44–1.00)
GFR, Estimated: 29 mL/min — ABNORMAL LOW (ref >60)
Glucose, Bld: 121 mg/dL — ABNORMAL HIGH (ref 70–99)
Potassium: 4.8 mmol/L (ref 3.5–5.1)
Sodium: 136 mmol/L (ref 135–145)

## 2022-11-04 LAB — CBC
HCT: 32.9 % — ABNORMAL LOW (ref 36.0–46.0)
Hemoglobin: 10.9 g/dL — ABNORMAL LOW (ref 12.0–15.0)
MCH: 30.8 pg (ref 26.0–34.0)
MCHC: 33.1 g/dL (ref 30.0–36.0)
MCV: 92.9 fL (ref 80.0–100.0)
Platelets: 335 10*3/uL (ref 150–400)
RBC: 3.54 MIL/uL — ABNORMAL LOW (ref 3.87–5.11)
RDW: 16.7 % — ABNORMAL HIGH (ref 11.5–15.5)
WBC: 8.7 10*3/uL (ref 4.0–10.5)
nRBC: 0 % (ref 0.0–0.2)

## 2022-11-04 LAB — PROTIME-INR
INR: 2.5 — ABNORMAL HIGH (ref 0.8–1.2)
Prothrombin Time: 26.9 s — ABNORMAL HIGH (ref 11.4–15.2)

## 2022-11-04 MED ORDER — IRON SUCROSE 300 MG IVPB - SIMPLE MED
300.0000 mg | Freq: Once | Status: AC
  Administered 2022-11-04: 300 mg via INTRAVENOUS
  Filled 2022-11-04: qty 300

## 2022-11-04 NOTE — Progress Notes (Signed)
Progress Note   Patient: Elizabeth Fields VOZ:[id number] DOB: 21-Mar-1931 DOA: 11/02/2022     1 DOS: the patient was seen and examined on 11/04/2022   Brief hospital course: 89 year old female past medical history of chronic venous stasis dermatitis with ulcers, atrial fibrillation on warfarin, severe tricuspid regurgitation, mild aortic regurgitation and mitral regurgitation.  She was sent in from the wound care center for bleeding lower extremity wounds.  In the ER, they placed a pressure wrap.  INR 6.6 on presentation patient given a dose of vitamin K.  10/26.  Hemoglobin drifted down to 7.6.  (Back in August hemoglobin was 11.0).  Will give a unit of blood.  Continue to hold Coumadin.  Today's INR 3.2.  Will need to look at her wounds tomorrow to determine on what to do with Coumadin. 10/27.  Hemoglobin up to 10.9 after transfusion.  INR 2.5.  With unwrapping the dressing still has some bleeding right lower extremity.  Will continue to hold Coumadin apply pressure dressing and reevaluate tomorrow.  Assessment and Plan: * Acute blood loss anemia Good response to blood transfusion for hemoglobin of 7.5.  Hemoglobin 10.9 today.  Will give IV iron since unable to tolerate oral iron.  Supratherapeutic INR Received 1 dose of vitamin K in the emergency room.  Today's INR 2.5.  With wound still bleeding a little bit will have to hold Coumadin.  Likely will have to go home without Coumadin.  Venous stasis dermatitis of both lower extremities History of chronic venous stasis ulcers of bilateral lower extremities, for which she follows with both dermatology and the wound center.  There is now hemorrhagic bullae.  Patient had a large skin tear that was bleeding today anterior right leg and a large hemorrhagic bullae of the right leg that was not actively bleeding.  Left leg looked okay with scabs and without active bleeding.  Atrial fibrillation (HCC) Chronic in nature.  Warfarin on hold secondary to  anemia and bleeding and venous stasis ulcers.  Continue metoprolol.  Chronic kidney disease, stage III (moderate) (HCC) CKD stage IIIb.  Creatinine 1.69 with a GFR of 29.  Hold oral Lasix.  (HFpEF) heart failure with preserved ejection fraction (HCC) Patient has a history of HFpEF with last EF of 50%, last evaluated in 2021. Euvolemic at this time.  -Patient on Toprol.  Hold oral Lasix.  Hypothyroidism Continue levothyroxine  Chronic pain syndrome On tramadol        Subjective: Patient feels okay.  Right anterior skin tear was bleeding when we took off the dressing.  Patient does have a right lateral hematoma on the leg.  INR still 2.5 today.  Hemoglobin up to 10.9 after transfusion.  Came in with bleeding from lower extremities.  Physical Exam: Vitals:   11/03/22 1829 11/03/22 1941 11/04/22 0531 11/04/22 0814  BP: (!) 95/53 (!) 108/51 101/70 104/60  Pulse: 76 86 79 100  Resp: 17 16 14 18   Temp: 98.1 F (36.7 C) 97.9 F (36.6 C) (!) 97.4 F (36.3 C) 98 F (36.7 C)  TempSrc: Oral   Oral  SpO2: 100% 100% 100% 100%  Weight:      Height:       Physical Exam HENT:     Head: Normocephalic.     Mouth/Throat:     Pharynx: No oropharyngeal exudate.  Eyes:     General: Lids are normal.  Cardiovascular:     Rate and Rhythm: Normal rate. Rhythm irregularly irregular.     Heart  sounds: Normal heart sounds, S1 normal and S2 normal.  Pulmonary:     Breath sounds: No decreased breath sounds, wheezing, rhonchi or rales.  Abdominal:     Palpations: Abdomen is soft.     Tenderness: There is no abdominal tenderness.  Musculoskeletal:     Right lower leg: No swelling.     Left lower leg: No swelling.  Skin:    General: Skin is warm.     Comments: See picture below of right leg.  Left leg has a few scabbed lesions no active bleeding.  Neurological:     Mental Status: She is alert.     Data Reviewed: Hemoglobin 10.9, platelet count 335, creatinine 1.69  Family  Communication: Left message for nephew  Disposition: Status is: Inpatient Remains inpatient appropriate because: Patient still has some bleeding right lower extremity.  With INR of 2.5 will continue to watch here.  Holding Coumadin.  Patient lives alone and unable to do dressing care.  Will have to set up home health when she is ready to go home.  Planned Discharge Destination: Home with Home Health    Time spent: 28 minutes  Author: Alford Highland, MD 11/04/2022 12:25 PM  For on call review [email or web address].

## 2022-11-04 NOTE — Plan of Care (Signed)
  Problem: Education: Goal: Knowledge of General Education information will improve Description: Including pain rating scale, medication(s)/side effects and non-pharmacologic comfort measures Outcome: Progressing   Problem: Health Behavior/Discharge Planning: Goal: Ability to manage health-related needs will improve Outcome: Progressing   Problem: Clinical Measurements: Goal: Ability to maintain clinical measurements within normal limits will improve Outcome: Progressing Goal: Will remain free from infection Outcome: Progressing Goal: Diagnostic test results will improve Outcome: Progressing Goal: Respiratory complications will improve Outcome: Progressing Goal: Cardiovascular complication will be avoided Outcome: Progressing   Problem: Activity: Goal: Risk for activity intolerance will decrease Outcome: Progressing   Problem: Coping: Goal: Level of anxiety will decrease Outcome: Progressing   Problem: Elimination: Goal: Will not experience complications related to bowel motility Outcome: Progressing Goal: Will not experience complications related to urinary retention Outcome: Progressing   Problem: Pain Management: Goal: General experience of comfort will improve Outcome: Progressing   Problem: Safety: Goal: Ability to remain free from injury will improve Outcome: Progressing   Problem: Skin Integrity: Goal: Risk for impaired skin integrity will decrease Outcome: Progressing   Problem: Nutrition: Goal: Adequate nutrition will be maintained Outcome: Not Progressing  Poor PO intake this shift

## 2022-11-04 NOTE — Plan of Care (Signed)

## 2022-11-05 DIAGNOSIS — R791 Abnormal coagulation profile: Secondary | ICD-10-CM | POA: Diagnosis not present

## 2022-11-05 DIAGNOSIS — D62 Acute posthemorrhagic anemia: Secondary | ICD-10-CM | POA: Diagnosis not present

## 2022-11-05 DIAGNOSIS — I872 Venous insufficiency (chronic) (peripheral): Secondary | ICD-10-CM | POA: Diagnosis not present

## 2022-11-05 DIAGNOSIS — I482 Chronic atrial fibrillation, unspecified: Secondary | ICD-10-CM | POA: Diagnosis not present

## 2022-11-05 LAB — TYPE AND SCREEN
ABO/RH(D): AB POS
Antibody Screen: NEGATIVE
Unit division: 0

## 2022-11-05 LAB — PROTIME-INR
INR: 2.2 — ABNORMAL HIGH (ref 0.8–1.2)
Prothrombin Time: 24.6 s — ABNORMAL HIGH (ref 11.4–15.2)

## 2022-11-05 LAB — BASIC METABOLIC PANEL
Anion gap: 8 (ref 5–15)
BUN: 30 mg/dL — ABNORMAL HIGH (ref 8–23)
CO2: 25 mmol/L (ref 22–32)
Calcium: 7.9 mg/dL — ABNORMAL LOW (ref 8.9–10.3)
Chloride: 104 mmol/L (ref 98–111)
Creatinine, Ser: 1.07 mg/dL — ABNORMAL HIGH (ref 0.44–1.00)
GFR, Estimated: 50 mL/min — ABNORMAL LOW (ref >60)
Glucose, Bld: 103 mg/dL — ABNORMAL HIGH (ref 70–99)
Potassium: 3.8 mmol/L (ref 3.5–5.1)
Sodium: 137 mmol/L (ref 135–145)

## 2022-11-05 LAB — BPAM RBC
Blood Product Expiration Date: 202411202359
ISSUE DATE / TIME: 202410261504
Unit Type and Rh: 6200

## 2022-11-05 LAB — HEMOGLOBIN: Hemoglobin: 9 g/dL — ABNORMAL LOW (ref 12.0–15.0)

## 2022-11-05 NOTE — Progress Notes (Signed)
Elizabeth, Fields ([id number]) [phone] [id number] Page 1 of 5 Visit Report for 11/02/2022 Abuse Risk Screen Details Patient Name: Date of Service: Elizabeth, Fields 11/02/2022 8:45 A M Medical Record Number: [id number] Patient Account Number: [id number] Date of Birth/Sex: Treating RN: [birth year] (89 y.o. Freddy Finner Primary Care Earnstine Meinders: Marcelino Duster Other Clinician: Referring Karan Ramnauth: Treating Daphna Lafuente/Extender: Joylene Grapes in Treatment: 0 Abuse Risk Screen Items Answer ABUSE RISK SCREEN: Has anyone close to you tried to hurt or harm you recentlyo No Do you feel uncomfortable with anyone in your familyo No Has anyone forced you do things that you didnt want to doo No Electronic Signature(s) Signed: 11/05/2022 8:00:25 AM By: Yevonne Pax RN Entered By: Yevonne Pax on 11/02/2022 06:17:51 -------------------------------------------------------------------------------- Activities of Daily Living Details Patient Name: Date of Service: Elizabeth, Fields 11/02/2022 8:45 A M Medical Record Number: [id number] Patient Account Number: [id number] Date of Birth/Sex: Treating RN: 12/10/1931 (89 y.o. Freddy Finner Primary Care Tika Hannis: Marcelino Duster Other Clinician: Referring Eldonna Neuenfeldt: Treating Troi Bechtold/Extender: Joylene Grapes in Treatment: 0 Activities of Daily Living Items Answer Activities of Daily Living (Please select one for each item) Drive Automobile Not Able T Medications ake Need Assistance Use T elephone Need Assistance Care for Appearance Need Assistance Use T oilet Need Assistance Bath / Shower Need Assistance Dress Self Need Assistance Feed Self Completely Able Walk Need Assistance Get In / Out Bed Need Assistance Housework Not SHANDORA, LARSEN ([id number]) [phone] [email or web address] Page 2 of 5 Prepare Meals Not Able Handle Money Need Assistance Shop for Self Not  Able Electronic Signature(s) Signed: 11/05/2022 8:00:25 AM By: Yevonne Pax RN Entered By: Yevonne Pax on 11/02/2022 06:19:11 -------------------------------------------------------------------------------- Education Screening Details Patient Name: Date of Service: Elizabeth, Fields 11/02/2022 8:45 A M Medical Record Number: [id number] Patient Account Number: [id number] Date of Birth/Sex: Treating RN: Dec 24, 1931 (89 y.o. Freddy Finner Primary Care Jaxin Fulfer: Marcelino Duster Other Clinician: Referring Nella Botsford: Treating Jerusalen Mateja/Extender: Joylene Grapes in Treatment: 0 Primary Learner Assessed: Patient Learning Preferences/Education Level/Primary Language Learning Preference: Explanation Highest Education Level: High School Preferred Language: English Cognitive Barrier Language Barrier: No Translator Needed: No Memory Deficit: No Emotional Barrier: No Cultural/Religious Beliefs Affecting Medical Care: No Physical Barrier Impaired Vision: No Impaired Hearing: Yes Complete Loss Knowledge/Comprehension Knowledge Level: Medium Comprehension Level: Medium Ability to understand written instructions: Medium Ability to understand verbal instructions: Medium Motivation Anxiety Level: Anxious Cooperation: Cooperative Education Importance: Acknowledges Need Interest in Health Problems: Asks Questions Perception: Coherent Willingness to Engage in Self-Management High Activities: Readiness to Engage in Self-Management High Activities: Electronic Signature(s) Signed: 11/05/2022 8:00:25 AM By: Yevonne Pax RN Entered By: Yevonne Pax on 11/02/2022 06:19:53 Tenny Craw ([id number]) [id number] Page 3 of 5 -------------------------------------------------------------------------------- Fall Risk Assessment Details Patient Name: Date of Service: Elizabeth, Fields 11/02/2022 8:45 A M Medical Record Number: [id number] Patient Account  Number: [id number] Date of Birth/Sex: Treating RN: 09-13-1931 (89 y.o. Freddy Finner Primary Care Brae Schaafsma: Marcelino Duster Other Clinician: Referring Tamarius Rosenfield: Treating Devanny Palecek/Extender: Joylene Grapes in Treatment: 0 Fall Risk Assessment Items Have you had 2 or more falls in the last 12 monthso 0 No Have you had any fall that resulted in injury in the last 12 monthso 0 No FALLS RISK SCREEN History of falling - immediate or within 3 months 25 Yes Secondary diagnosis (Do you have 2 or more medical diagnoseso) 0 No Ambulatory aid None/bed rest/wheelchair/nurse 0 Yes Crutches/cane/walker  0 No Furniture 0 No Intravenous therapy Access/Saline/Heparin Lock 0 No Gait/Transferring Normal/ bed rest/ wheelchair 0 Yes Weak (short steps with or without shuffle, stooped but able to lift head while walking, may seek 0 No support from furniture) Impaired (short steps with shuffle, may have difficulty arising from chair, head down, impaired 0 No balance) Mental Status Oriented to own ability 0 Yes Electronic Signature(s) Signed: 11/05/2022 8:00:25 AM By: Yevonne Pax RN Entered By: Yevonne Pax on 11/02/2022 06:21:13 -------------------------------------------------------------------------------- Foot Assessment Details Patient Name: Date of Service: Elizabeth Fields 11/02/2022 8:45 A M Medical Record Number: [id number] Patient Account Number: [id number] Date of Birth/Sex: Treating RN: 07/09/1931 (89 y.o. Freddy Finner Primary Care Delrick Dehart: Marcelino Duster Other Clinician: Referring Emunah Texidor: Treating Tyliek Timberman/Extender: Joylene Grapes in Treatment: 0 Foot Assessment Items Site Locations Elizabeth, Fields ([id number]) [id number] Page 4 of 5 + = Sensation present, - = Sensation absent, C = Callus, U = Ulcer R = Redness, W = Warmth, M = Maceration, PU = Pre-ulcerative lesion F = Fissure, S = Swelling, D =  Dryness Assessment Right: Left: Other Deformity: No No Prior Foot Ulcer: No No Prior Amputation: No No Charcot Joint: No No Ambulatory Status: Ambulatory With Help Assistance Device: Wheelchair Gait: Electronic Signature(s) Signed: 11/05/2022 8:00:25 AM By: Yevonne Pax RN Entered By: Yevonne Pax on 11/02/2022 06:21:46 -------------------------------------------------------------------------------- Nutrition Risk Screening Details Patient Name: Date of Service: Elizabeth, Fields 11/02/2022 8:45 A M Medical Record Number: [id number] Patient Account Number: [id number] Date of Birth/Sex: Treating RN: 01/20/1931 (89 y.o. Freddy Finner Primary Care Devaun Hernandez: Marcelino Duster Other Clinician: Referring Vadie Principato: Treating Aribelle Mccosh/Extender: Joylene Grapes in Treatment: 0 Height (in): 59 Weight (lbs): 84 Body Mass Index (BMI): 17 Nutrition Risk Screening Items Score Screening NUTRITION RISK SCREEN: I have an illness or condition that made me change the kind and/or amount of food I eat 0 No I eat fewer than two meals per day 0 No I eat few fruits and vegetables, or milk products 0 No I have three or more drinks of beer, liquor or wine almost every day 0 No I have tooth or mouth problems that make it hard for me to eat 0 No [place], Senft Apurva C ([id number]) [id number] Page 5 of 5 I don't always have enough money to buy the food I need 0 No I eat alone most of the time 0 No I take three or more different prescribed or over-the-counter drugs a day 1 Yes Without wanting to, I have lost or gained 10 pounds in the last six months 0 No I am not always physically able to shop, cook and/or feed myself 0 No Nutrition Protocols Good Risk Protocol 0 No interventions needed Moderate Risk Protocol High Risk Proctocol Risk Level: Good Risk Score: 1 Electronic Signature(s) Signed: 11/05/2022 8:00:25 AM By: Yevonne Pax RN Entered By: Yevonne Pax on  11/02/2022 62:95:28

## 2022-11-05 NOTE — Plan of Care (Signed)

## 2022-11-05 NOTE — Progress Notes (Addendum)
Elizabeth Fields, Elizabeth Fields ([id number]) [id number] Page 1 of 7 Visit Report for 11/02/2022 Allergy List Details Patient Name: Date of Service: Elizabeth Fields, Elizabeth Fields 11/02/2022 8:45 A M Medical Record Number: [id number] Patient Account Number: [id number] Date of Birth/Sex: Treating RN: [birth year] (89 y.o. Freddy Finner Primary Care Aarish Rockers: Marcelino Duster Other Clinician: Referring Averyanna Sax: Treating Kearsten Ginther/Extender: Youlanda Roys, Vivianne Spence in Treatment: 0 Allergies Active Allergies No Known Allergies Allergy Notes Electronic Signature(s) Signed: 11/02/2022 9:54:01 AM By: Yevonne Pax RN Entered By: Yevonne Pax on 11/02/2022 06:54:01 -------------------------------------------------------------------------------- Arrival Information Details Patient Name: Date of Service: Elizabeth Fields 11/02/2022 8:45 A M Medical Record Number: [id number] Patient Account Number: [id number] Date of Birth/Sex: Treating RN: January 22, 1931 (89 y.o. Freddy Finner Primary Care Emon Lance: Marcelino Duster Other Clinician: Referring Lindamarie Maclachlan: Treating Alvis Pulcini/Extender: Joylene Grapes in Treatment: 0 Visit Information Patient Arrived: Wheel Chair Arrival Time: 09:13 Accompanied By: nephew Transfer Assistance: None Patient Identification Verified: Yes Secondary Verification Process Completed: Yes Patient Requires Transmission-Based Precautions: No Patient Has Alerts: Yes Patient Alerts: Patient on Blood Thinner SUN, LALLO ([id number]) Electronic Signature(s) Signed: 11/05/2022 8:00:25 AM By: Yevonne Pax RN Entered By: Yevonne Pax History Since Last Visit Added or deleted any medications: No Any new allergies or adverse reactions: No Had a fall or experienced change in activities of daily living that may affect risk of falls: No Signs or symptoms of abuse/neglect since last visito No Hospitalized since last visit: No Implantable device outside of the clinic  excluding cellular tissue based products placed in the center since last visit: No Has Dressing in Place as Prescribed: Yes Pain Present Now: No [id number] Page 2 of 7 -------------------------------------------------------------------------------- Clinic Level of Care Assessment Details Patient Name: Date of Service: TAVA, MOHS 11/02/2022 8:45 A M Medical Record Number: [id number] Patient Account Number: [id number] Date of Birth/Sex: Treating RN: [birth year] (89 y.o. Freddy Finner Primary Care Mahamud Metts: Marcelino Duster Other Clinician: Referring Romond Pipkins: Treating Criston Chancellor/Extender: Joylene Grapes in Treatment: 0 Clinic Level of Care Assessment Items TOOL 2 Quantity Score X- 1 0 Use when only an EandM is performed on the INITIAL visit ASSESSMENTS - Nursing Assessment / Reassessment X- 1 20 General Physical Exam (combine w/ comprehensive assessment (listed just below) when performed on new pt. evals) X- 1 25 Comprehensive Assessment (HX, ROS, Risk Assessments, Wounds Hx, etc.) ASSESSMENTS - Wound and Skin A ssessment / Reassessment []  - 0 Simple Wound Assessment / Reassessment - one wound []  - 0 Complex Wound Assessment / Reassessment - multiple wounds []  - 0 Dermatologic / Skin Assessment (not related to wound area) ASSESSMENTS - Ostomy and/or Continence Assessment and Care []  - 0 Incontinence Assessment and Management []  - 0 Ostomy Care Assessment and Management (repouching, etc.) PROCESS - Coordination of Care []  - 0 Simple Patient / Family Education for ongoing care X- 1 20 Complex (extensive) Patient / Family Education for ongoing care []  - 0 Staff obtains Chiropractor, Records, T Results / Process Orders est []  - 0 Staff telephones HHA, Nursing Homes / Clarify orders / etc []  - 0 Routine Transfer to another Facility (non-emergent condition) X- 1 10 Routine Hospital Admission (non-emergent condition) X- 1 15 New  Admissions / Manufacturing engineer / Ordering NPWT Apligraf, etc. , []  - 0 Emergency Hospital Admission (emergent condition) []  - 0 Simple Discharge Coordination X- 1 15 Complex (extensive) Discharge Coordination PROCESS - Special Needs []  - 0 Pediatric / Minor Patient Management []  - 0 Isolation  Patient Management []  - 0 Hearing / Language / Visual special needs []  - 0 Assessment of Community assistance (transportation, D/Fields planning, etc.) []  - 0 Additional assistance / Altered mentation Elizabeth Fields, Elizabeth Fields ([id number]) [id number] Page 3 of 7 []  - 0 Support Surface(s) Assessment (bed, cushion, seat, etc.) INTERVENTIONS - Wound Cleansing / Measurement []  - 0 Wound Imaging (photographs - any number of wounds) []  - 0 Wound Tracing (instead of photographs) []  - 0 Simple Wound Measurement - one wound []  - 0 Complex Wound Measurement - multiple wounds []  - 0 Simple Wound Cleansing - one wound []  - 0 Complex Wound Cleansing - multiple wounds INTERVENTIONS - Wound Dressings []  - 0 Small Wound Dressing one or multiple wounds []  - 0 Medium Wound Dressing one or multiple wounds []  - 0 Large Wound Dressing one or multiple wounds []  - 0 Application of Medications - injection INTERVENTIONS - Miscellaneous []  - 0 External ear exam []  - 0 Specimen Collection (cultures, biopsies, blood, body fluids, etc.) []  - 0 Specimen(s) / Culture(s) sent or taken to Lab for analysis []  - 0 Patient Transfer (multiple staff / Nurse, adult / Similar devices) []  - 0 Simple Staple / Suture removal (25 or less) []  - 0 Complex Staple / Suture removal (26 or more) []  - 0 Hypo / Hyperglycemic Management (close monitor of Blood Glucose) []  - 0 Ankle / Brachial Index (ABI) - do not check if billed separately Has the patient been seen at the hospital within the last three years: Yes Total Score: 105 Level Of Care: New/Established - Level 3 Electronic Signature(s) Signed:  11/05/2022 8:00:25 AM By: Yevonne Pax RN Entered By: Yevonne Pax on 11/02/2022 06:46:14 -------------------------------------------------------------------------------- Encounter Discharge Information Details Patient Name: Date of Service: Elizabeth Fields 11/02/2022 8:45 A M Medical Record Number: [id number] Patient Account Number: [id number] Date of Birth/Sex: Treating RN: [birth year] (89 y.o. Freddy Finner Primary Care Aino Heckert: Marcelino Duster Other Clinician: Referring Dominique Ressel: Treating Hasset Chaviano/Extender: Joylene Grapes in Treatment: 0 Encounter Discharge Information Items Discharge Condition: Stable Ambulatory Status: Ambulatory Discharge Destination: Home Transportation: Private Auto Accompanied By: self Schedule Follow-up Appointment: Elizabeth Fields, Elizabeth Fields ([id number]) [id number] Page 4 of 7 Clinical Summary of Care: Electronic Signature(s) Signed: 11/02/2022 9:55:54 AM By: Yevonne Pax RN Entered By: Yevonne Pax on 11/02/2022 06:55:54 -------------------------------------------------------------------------------- Lower Extremity Assessment Details Patient Name: Date of Service: Elizabeth Fields, Elizabeth Fields 11/02/2022 8:45 A M Medical Record Number: [id number] Patient Account Number: [id number] Date of Birth/Sex: Treating RN: Dec 28, 1931 (89 y.o. Freddy Finner Primary Care Khamryn Calderone: Marcelino Duster Other Clinician: Referring Iain Sawchuk: Treating Kasheem Toner/Extender: Joylene Grapes in Treatment: 0 Edema Assessment Assessed: [Left: No] [Right: No] Edema: [Left: Yes] [Right: Yes] Vascular Assessment Pulses: Dorsalis Pedis Palpable: [Left:Yes] [Right:Yes] Extremity colors, hair growth, and conditions: Extremity Color: [Left:Hyperpigmented] [Right:Hyperpigmented] Hair Growth on Extremity: [Left:No] [Right:No] Temperature of Extremity: [Left:Warm] [Right:Warm] Capillary Refill: [Left:< 3 seconds] [Right:< 3  seconds] Dependent Rubor: [Right:No] Blanched when Elevated: [Left:No No] [Right:No No] Notes brusing noted from knees down with large blood filled blister like area to right leg with active bleeding noted and multiple dime size blood filled areas to both lower legs Electronic Signature(s) Signed: 11/02/2022 9:39:38 AM By: Yevonne Pax RN Entered By: Yevonne Pax on 11/02/2022 06:39:37 -------------------------------------------------------------------------------- Multi Wound Chart Details Patient Name: Date of Service: Elizabeth Fields 11/02/2022 8:45 A M Medical Record Number: [id number] Patient Account Number: [id number] Elizabeth Fields, Elizabeth Fields ([id number]) [id number] Page 5 of 7 Date of  Birth/Sex: Treating RN: [birth year] (89 y.o. Freddy Finner Primary Care Helon Wisinski: Other Clinician: Marcelino Duster Referring Rylah Fukuda: Treating Gladine Plude/Extender: Joylene Grapes in Treatment: 0 Vital Signs Height(in): 59 Pulse(bpm): 108 Weight(lbs): 84 Blood Pressure(mmHg): 123/69 Body Mass Index(BMI): 17 Temperature(F): 97.8 Respiratory Rate(breaths/min): 18 [Treatment Notes:Wound Assessments Treatment Notes] Electronic Signature(s) Signed: 11/02/2022 9:39:58 AM By: Yevonne Pax RN Entered By: Yevonne Pax on 11/02/2022 06:39:57 -------------------------------------------------------------------------------- Multi-Disciplinary Care Plan Details Patient Name: Date of Service: Elizabeth Fields 11/02/2022 8:45 A M Medical Record Number: [id number] Patient Account Number: [id number] Date of Birth/Sex: Treating RN: 05/19/1931 (89 y.o. Freddy Finner Primary Care Shadrack Brummitt: Marcelino Duster Other Clinician: Referring Fotios Amos: Treating Lear Carstens/Extender: Joylene Grapes in Treatment: 0 Active Inactive Electronic Signature(s) Signed: 11/22/2022 2:05:19 PM By: Elliot Gurney BSN, RN, CWS, Kim RN, BSN Signed: 11/28/2022 4:41:11 PM By: Yevonne Pax  RN Previous Signature: 11/02/2022 9:48:08 AM Version By: Yevonne Pax RN Entered By: Elliot Gurney BSN, RN, CWS, Kim on 11/22/2022 11:05:19 -------------------------------------------------------------------------------- Pain Assessment Details Patient Name: Date of Service: Elizabeth Fields, Elizabeth Fields 11/02/2022 8:45 A M Medical Record Number: [id number] Patient Account Number: [id number] Date of Birth/Sex: Treating RN: 03-22-1931 (89 y.o. Freddy Finner Primary Care Buddy Loeffelholz: Marcelino Duster Other Clinician: Referring Zamarian Scarano: Treating Aroura Vasudevan/Extender: Joylene Grapes in Treatment: 0 Elizabeth Fields, Elizabeth Fields ([id number]) [id number] Page 6 of 7 Active Problems Location of Pain Severity and Description of Pain Patient Has Paino No Site Locations Pain Management and Medication Current Pain Management: Electronic Signature(s) Signed: 11/05/2022 8:00:25 AM By: Yevonne Pax RN Entered By: Yevonne Pax on 11/02/2022 06:14:22 -------------------------------------------------------------------------------- Patient/Caregiver Education Details Patient Name: Date of Service: Elizabeth Fields 10/25/2024andnbsp8:45 A M Medical Record Number: [id number] Patient Account Number: [id number] Date of Birth/Gender: Treating RN: 01/17/1931 (89 y.o. Freddy Finner Primary Care Physician: Marcelino Duster Other Clinician: Referring Physician: Treating Physician/Extender: Joylene Grapes in Treatment: 0 Education Assessment Education Provided To: Patient Education Topics Provided Wound/Skin Impairment: Handouts: Other: coumadin/ bleeding issues Methods: Explain/Verbal Responses: State content correctly Electronic Signature(s) Signed: 11/05/2022 8:00:25 AM By: Yevonne Pax RN Entered By: Yevonne Pax on 11/02/2022 06:50:12 Elizabeth Fields ([id number]) [id number] Page 7 of  7 -------------------------------------------------------------------------------- Vitals Details Patient Name: Date of Service: Elizabeth Fields, Elizabeth Fields 11/02/2022 8:45 A M Medical Record Number: [id number] Patient Account Number: [id number] Date of Birth/Sex: Treating RN: [birth year] (89 y.o. Freddy Finner Primary Care Janelis Stelzer: Marcelino Duster Other Clinician: Referring Zimere Dunlevy: Treating Jacayla Nordell/Extender: Joylene Grapes in Treatment: 0 Vital Signs Time Taken: 09:14 Temperature (F): 97.8 Height (in): 59 Pulse (bpm): 108 Source: Stated Respiratory Rate (breaths/min): 18 Weight (lbs): 84 Blood Pressure (mmHg): 123/69 Source: Stated Reference Range: 80 - 120 mg / dl Body Mass Index (BMI): 17 Electronic Signature(s) Signed: 11/05/2022 8:00:25 AM By: Yevonne Pax RN Entered By: Yevonne Pax on 11/02/2022 06:16:21

## 2022-11-05 NOTE — TOC Initial Note (Signed)
Transition of Care [facility]) - Initial/Assessment Note    Patient Details  Name: Elizabeth Fields MRN: [id number] Date of Birth: Nov 21, 1931  Transition of Care [facility]) CM/SW Contact:    Allena Katz, LCSW Phone Number: 11/05/2022, 3:56 PM  Clinical Narrative:     CSW LVM with patients son who reports he would like additional assistance at home apart from [facility].               Expected Discharge Plan: Home w Home Health Services Barriers to Discharge: Continued Medical Work up   Patient Goals and CMS Choice   CMS Medicare.gov Compare Post Acute Care list provided to:: Patient        Expected Discharge Plan and Services     Post Acute Care Choice: Durable Medical Equipment Living arrangements for the past 2 months: Single Family Home                           HH Arranged: PT, OT          Prior Living Arrangements/Services Living arrangements for the past 2 months: Single Family Home            Need for Family Participation in Patient Care: Yes (Comment) Care giver support system in place?: Yes (comment) Current home services: DME    Activities of Daily Living   ADL Screening (condition at time of admission) Independently performs ADLs?: Yes (appropriate for developmental age) Is the patient deaf or have difficulty hearing?: Yes Does the patient have difficulty seeing, even when wearing glasses/contacts?: No Does the patient have difficulty concentrating, remembering, or making decisions?: No  Permission Sought/Granted                  Emotional Assessment       Orientation: : Fluctuating Orientation (Suspected and/or reported Sundowners)      Admission diagnosis:  Supratherapeutic INR [R79.1] Acute blood loss anemia [D62] Patient Active Problem List   Diagnosis Date Noted   Acute blood loss anemia 11/03/2022   Supratherapeutic INR 11/02/2022   (HFpEF) heart failure with preserved ejection fraction (HCC) 11/02/2022   Venous stasis dermatitis of  both lower extremities 10/30/2022   Venous stasis ulcer with fat layer exposed (HCC) 10/30/2022   Spondylosis without myelopathy or radiculopathy, thoracic region 03/29/2020   Thoracic facet syndrome (Multilevel) (Bilateral) 03/22/2020   Spondylosis without myelopathy or radiculopathy, lumbosacral region 03/08/2020   Osteoarthritis of facet joint of lumbar spine 03/08/2020   DDD (degenerative disc disease), cervical 03/02/2020   Atherosclerotic peripheral vascular disease (HCC) 03/02/2020   Uncomplicated opioid dependence (HCC) 02/03/2020   Chronic anticoagulation (Coumadin) 02/03/2020   Hyponatremia 02/03/2020   Hypocalcemia 02/03/2020   Hypermagnesemia 02/03/2020   Decreased GFR 02/03/2020   Elevated serum creatinine 02/03/2020   Elevated brain natriuretic peptide (BNP) level 02/03/2020   Low hemoglobin 02/03/2020   Low hematocrit 02/03/2020   Compression fracture of L4 vertebra, sequela 02/03/2020   Compression fracture of L1 vertebra, sequela  02/03/2020   Compression fracture of T12 vertebra, sequela 02/03/2020   Abnormal MRI, lumbar spine (11/26/2018) 02/03/2020   DDD (degenerative disc disease), lumbosacral 02/03/2020   DDD (degenerative disc disease), thoracolumbar 02/03/2020   Osteoarthritis of sacroiliac joints (HCC) (Bilateral) 02/03/2020   Chronic sacroiliac joint pain (Bilateral) 02/03/2020   History of osteopenia 02/03/2020   Chronic midline thoracic back pain 02/03/2020   Pharmacologic therapy 01/26/2020   Disorder of skeletal system 01/26/2020   Problems influencing health  status 01/26/2020   Multiple chronic diseases 01/26/2020   Fall 03/30/2019   Hypothyroidism 02/10/2019   Protein-calorie malnutrition, severe 02/10/2019   Hip fracture, sequela (Left) 02/09/2019   Benign hypertensive kidney disease with chronic kidney disease 09/24/2018   Edema 09/24/2018   Hematuria 09/24/2018   Secondary hyperparathyroidism of renal origin (HCC) 09/24/2018   Lumbar  spondylosis 09/06/2015   Chronic pain syndrome 09/06/2015   Chronic sacroiliac joint pain (Left) 07/28/2015   Arteriosclerosis of coronary artery 07/28/2015   Bergmann's syndrome (AKA: Fat Embolism Syndrome) 07/28/2015   HLD (hyperlipidemia) 07/28/2015   Billowing mitral valve 07/28/2015   Osteoporosis, post-menopausal 07/28/2015   Lumbar facet syndrome (Bilateral) 07/28/2015   Chronic low back pain (1ry area of Pain) (Bilateral) w/o sciatica 07/28/2015   Breathlessness on exertion 02/14/2015   Vitamin B12 deficiency anemia 04/26/2014   Acquired atrophy of thyroid 01/20/2014   Chronic kidney disease, stage III (moderate) (HCC) 10/20/2013   Beat, premature ventricular 08/26/2013   History of cardiac catheterization 08/26/2013   MI (mitral incompetence) 08/26/2013   TI (tricuspid incompetence) 08/26/2013   Other specified postprocedural states 08/26/2013   Aortic insufficiency 08/26/2013   Rheumatic tricuspid insufficiency 08/26/2013   Atrial fibrillation (HCC) 05/05/2013   PCP:  Gracelyn Nurse, MD Pharmacy:   [facility] - [place], [place] - [address] [place] [address] Phone: [phone] Fax: [phone]     Social Determinants of Health (SDOH) Social History: SDOH Screenings   Food Insecurity: No Food Insecurity (11/02/2022)  Housing: Low Risk  (11/02/2022)  Transportation Needs: No Transportation Needs (11/02/2022)  Utilities: Not At Risk (11/02/2022)  Depression (PHQ2-9): Low Risk  (04/19/2020)  Financial Resource Strain: Low Risk  (08/21/2022)   Received from [facility]  Tobacco Use: Low Risk  (11/02/2022)   SDOH Interventions:     Readmission Risk Interventions     No data to display

## 2022-11-05 NOTE — Progress Notes (Signed)
Elizabeth Fields, Elizabeth Fields ([id number]) [id number] Page 1 of 9 Visit Report for 11/02/2022 Chief Complaint Document Details Patient Name: Date of Service: Elizabeth Fields, Elizabeth Fields 11/02/2022 8:45 A M Medical Record Number: [id number] Patient Account Number: [id number] Date of Birth/Sex: Treating RN: [birth year] (89 y.o. Freddy Finner Primary Care Provider: Marcelino Duster Other Clinician: Referring Provider: Treating Provider/Extender: Joylene Grapes in Treatment: 0 Information Obtained from: Patient Chief Complaint Bilateral LE hemorrhagic ulcers and right leg venous ulcer Electronic Signature(s) Signed: 11/02/2022 9:35:36 AM By: Allen Derry PA-Fields Entered By: Allen Derry on 11/02/2022 06:35:36 -------------------------------------------------------------------------------- HPI Details Patient Name: Date of Service: Elizabeth Fields 11/02/2022 8:45 A M Medical Record Number: [id number] Patient Account Number: [id number] Date of Birth/Sex: Treating RN: March 04, 1931 (89 y.o. Freddy Finner Primary Care Provider: Marcelino Duster Other Clinician: Referring Provider: Treating Provider/Extender: Joylene Grapes in Treatment: 0 History of Present Illness HPI Description: 01/17/18 on evaluation today patient presents for evaluation of two ulcers both on the anterior portion of her lower extremities one right and one left. The right she has had for about three weeks the left for about eight weeks and both occurred as a result of her traumatizing her shins on the bottom of the card were trying to get out of the car. She states this is never happen with any other car doors before but she has been having some issues with this sharp area on the store in particular. Both occurred in the similar situation where she was trying not to open the door the whole way in order to avoid damaging anybody else's car beside them. She is been using peroxide initially to clean  the wound although she has converted to the wound cleanser now. She has been using if anything just over the counter triple antibiotic ointment and a Band-Aid. With that being said she's not even been using that more recently. The most part she's just been attempting allow this to dry out/scab over. Her ABIs were greater than 220 and noncompressible. Patient does have a history of paroxysmal atrial fibrillation for which she is on Coumadin. She also has chronic kidney disease stage III. She has been on antibiotics that I see no evidence of infection right now which is good news. Readmission: 11-02-2022 patient presents for initial inspection here in our clinic this is an individual whom I have seen before although it has been about 4 years since I last saw her. She does have atrial fibrillation she is on Coumadin regularly. With that being said she had a wound on the lower extremity right leg which was more of a venous leg ulcer and subsequently I did review Dr. Stasia Cavalier note in that regard and to be honest I completely agree with the assessment that that wound is indeed looking to be more of a venous leg ulcer that is on the distal/lateral aspect of the right leg. With that being said unfortunately we have a bigger issue going on here at this point in my opinion with the fact that she has multiple areas which are basically hemorrhagic regions on both lower extremities as well as Elizabeth Fields, Elizabeth Fields ([id number]) [id number] Page 2 of 9 bruising around her left periorbital area which has spontaneously been happening. I am concerned about the possibility that she may have issues with a INR that may be too elevated from what I could see it has not look like it has been checked since September 5 that the last which has  Disease: No; Seizures: No; Never smoker; Marital Status - Married; Alcohol Use: Never; Drug Use: No History; Caffeine Use: Rarely; Financial Concerns: No; Food, Clothing or Shelter Needs: No; Support System Lacking: No; Transportation Concerns: No Electronic Signature(s) Signed: 11/02/2022 1:31:37 PM By: Allen Derry PA-Fields Signed: 11/05/2022 8:00:25 AM By: Yevonne Pax RN Entered By: Yevonne Pax on 11/02/2022 06:17:38 -------------------------------------------------------------------------------- SuperBill Details Patient Name: Date of Service: Elizabeth Fields 11/02/2022 Medical Record Number: [id number] Patient Account Number: [id number] Date of Birth/Sex: Treating RN: 01-15-1931 (89 y.o. Freddy Finner Primary Care Provider: Marcelino Duster Other Clinician: Referring Provider: Treating Provider/Extender: Joylene Grapes in Treatment: 0 Diagnosis Coding ICD-10 Codes Code Description [id number] Chronic venous hypertension (idiopathic) with ulcer and inflammation of right lower extremity L97.812 Non-pressure chronic ulcer of other part of right lower leg with fat layer exposed S80.11XA Contusion of right lower leg, initial encounter I48.0 Paroxysmal atrial fibrillation Z79.01 Long term (current) use of anticoagulants N18.30 Chronic kidney disease, stage 3 unspecified Facility Procedures : CPT4 Code: [id number] Description: 62130 - WOUND CARE VISIT-LEV 3 EST PT Modifier: Quantity: 1 Physician Procedures Electronic Signature(s) Signed: 11/02/2022 9:46:34 AM By: Yevonne Pax RN Signed: 11/02/2022 1:31:37 PM By:  Allen Derry PA-Fields Previous Signature: 11/02/2022 9:40:21 AM Version By: Allen Derry PA-Fields Entered By: Yevonne Pax on 11/02/2022 06:46:33  been about a month and a half as best I can tell going on close to 2 months. With that being said I am not sure if her INR is elevated beyond the 2.7 that it was but  she states that the bruising and the new areas that are popping up have just started more recently. She also has an area of hematoma/bleeding over the right lateral calf where she tells me that she hit her walker on this. Subsequently there is a big hemorrhagic hematoma at this location and subsequently this is also still continuing to bleed as soon as you remove what ever is on it it starts to profusely drain but it is dripping blood she tells me that she "has lost a lot of blood". With that being said I am concerned about the fact that her INR may be elevated at this point to be honest. I do not know what any recent levels would say but again the last 1 was 2.7 for the INR on September 13, 2022. That was before though she had all the issues with the spontaneous bleeding that we are dealing with at the moment specifically around the left periorbital region. Electronic Signature(s) Signed: 11/02/2022 9:37:04 AM By: Allen Derry PA-Fields Entered By: Allen Derry on 11/02/2022 06:37:04 -------------------------------------------------------------------------------- Physical Exam Details Patient Name: Date of Service: Elizabeth Fields, Elizabeth Fields 11/02/2022 8:45 A M Medical Record Number: [id number] Patient Account Number: [id number] Date of Birth/Sex: Treating RN: 1931/04/26 (89 y.o. Freddy Finner Primary Care Provider: Marcelino Duster Other Clinician: Referring Provider: Treating Provider/Extender: Joylene Grapes in Treatment: 0 Constitutional sitting or standing blood pressure is within target range for patient.. pulse regular and within target range for patient.Marland Kitchen respirations regular, non-labored and within target range for patient.Marland Kitchen temperature within target range for patient.. Thin and well-hydrated in no acute distress. Eyes conjunctiva clear no eyelid edema noted. pupils equal round and reactive to light and accommodation. Ears, Nose, Mouth, and Throat no gross abnormality of ear  auricles or external auditory canals. normal hearing noted during conversation. mucus membranes moist. Respiratory normal breathing without difficulty. Cardiovascular 1+ dorsalis pedis/posterior tibialis pulses. trace pitting edema of the bilateral lower extremities. Musculoskeletal normal gait and posture. no significant deformity or arthritic changes, no loss or range of motion, no clubbing. Psychiatric this patient is able to make decisions and demonstrates good insight into disease process. Alert and Oriented x 3. pleasant and cooperative. Notes Upon inspection patient's wound bed actually showed signs of having some small venous leg ulcerations noted on the right lateral leg this is what Dr. Laural Benes saw that he actually referred her to [place] for. With that being said unfortunately she is having more significant issues here at this point with in my opinion spontaneous areas of bleeding and hemorrhagic bullae which are noted over the bilateral lower extremities. She also has a large area of hemorrhagic bullae/hematoma over the right lateral leg and at this point that area in particular is bleeding pretty profusely when I just removed her dressing she tells me she has been having the drainage this frequently. I am most concerned about that above all else to be perfectly honest. At least coupled with the area of spontaneous bruising around her left periorbital area which again she has no trauma to and this just showed up she tells me in the last 24 to 48 hours. Electronic Signature(s) Signed: 11/02/2022 9:38:10 AM By: Allen Derry PA-Fields Entered By: Allen Derry on 11/02/2022 06:38:09 Berninger, Patt Fields (  [id number]) [id number] Page 3 of 9 -------------------------------------------------------------------------------- Physician Orders Details Patient Name: Date of Service: EKATERINA, DELAUNE 11/02/2022 8:45 A M Medical Record Number: [id number] Patient Account Number:  [id number] Date of Birth/Sex: Treating RN: 1931-08-21 (89 y.o. Freddy Finner Primary Care Provider: Marcelino Duster Other Clinician: Referring Provider: Treating Provider/Extender: Joylene Grapes in Treatment: 0 Verbal / Phone Orders: No Diagnosis Coding ICD-10 Coding Code Description I87.331 Chronic venous hypertension (idiopathic) with ulcer and inflammation of right lower extremity L97.812 Non-pressure chronic ulcer of other part of right lower leg with fat layer exposed S80.11XA Contusion of right lower leg, initial encounter I48.0 Paroxysmal atrial fibrillation Z79.01 Long term (current) use of anticoagulants N18.30 Chronic kidney disease, stage 3 unspecified Follow-up Appointments Return Appointment in 1 week. Bathing/ Shower/ Hygiene May shower with wound dressing protected with water repellent cover or cast protector. No tub bath. Edema Control - Orders / Instructions Elevate, Exercise Daily and A void Standing for Long Periods of Time. Elevate legs to the level of the heart and pump ankles as often as possible Elevate leg(s) parallel to the floor when sitting. Non-Wound Condition dditional non-wound orders/instructions: - apply xeroform gauze abd and kerlix to blood filled blister like area to right leg - patient to go to A emergency room for eval Electronic Signature(s) Signed: 11/02/2022 1:31:37 PM By: Allen Derry PA-Fields Signed: 11/05/2022 8:00:25 AM By: Yevonne Pax RN Previous Signature: 11/02/2022 9:41:00 AM Version By: Yevonne Pax RN Entered By: Yevonne Pax on 11/02/2022 06:44:21 -------------------------------------------------------------------------------- Problem List Details Patient Name: Date of Service: Elizabeth Fields 11/02/2022 8:45 A M Medical Record Number: [id number] Patient Account Number: [id number] Date of Birth/Sex: Treating RN: 06-03-1931 (89 y.o. Freddy Finner Primary Care Provider: Marcelino Duster Other Clinician: LUCCA, CRUZAN ([id number]) [id number] Page 4 of 9 Referring Provider: Treating Provider/Extender: Youlanda Roys, Vivianne Spence in Treatment: 0 Active Problems ICD-10 Encounter Code Description Active Date MDM Diagnosis I87.331 Chronic venous hypertension (idiopathic) with ulcer and inflammation of right 11/02/2022 No Yes lower extremity L97.812 Non-pressure chronic ulcer of other part of right lower leg with fat layer 11/02/2022 No Yes exposed S80.11XA Contusion of right lower leg, initial encounter 11/02/2022 No Yes I48.0 Paroxysmal atrial fibrillation 11/02/2022 No Yes Z79.01 Long term (current) use of anticoagulants 11/02/2022 No Yes N18.30 Chronic kidney disease, stage 3 unspecified 11/02/2022 No Yes Inactive Problems Resolved Problems Electronic Signature(s) Signed: 11/02/2022 9:35:04 AM By: Allen Derry PA-Fields Entered By: Allen Derry on 11/02/2022 06:35:04 -------------------------------------------------------------------------------- Progress Note Details Patient Name: Date of Service: Elizabeth Fields 11/02/2022 8:45 A M Medical Record Number: [id number] Patient Account Number: [id number] Date of Birth/Sex: Treating RN: 1931/05/19 (89 y.o. Freddy Finner Primary Care Provider: Marcelino Duster Other Clinician: Referring Provider: Treating Provider/Extender: Joylene Grapes in Treatment: 0 Subjective Chief Complaint Information obtained from Patient Bilateral LE hemorrhagic ulcers and right leg venous ulcer History of Present Illness (HPI) 01/17/18 on evaluation today patient presents for evaluation of two ulcers both on the anterior portion of her lower extremities one right and one left. The right BEKKA, POPIEL ([id number]) [id number] Page 5 of 9 she has had for about three weeks the left for about eight weeks and both occurred as a result of her traumatizing her shins on the bottom of the card  were trying to get out of the car. She states this is never happen with any other car doors before but she has been having some issues with this  Elizabeth Fields, Elizabeth Fields ([id number]) [id number] Page 1 of 9 Visit Report for 11/02/2022 Chief Complaint Document Details Patient Name: Date of Service: Elizabeth Fields, Elizabeth Fields 11/02/2022 8:45 A M Medical Record Number: [id number] Patient Account Number: [id number] Date of Birth/Sex: Treating RN: [birth year] (89 y.o. Freddy Finner Primary Care Provider: Marcelino Duster Other Clinician: Referring Provider: Treating Provider/Extender: Joylene Grapes in Treatment: 0 Information Obtained from: Patient Chief Complaint Bilateral LE hemorrhagic ulcers and right leg venous ulcer Electronic Signature(s) Signed: 11/02/2022 9:35:36 AM By: Allen Derry PA-Fields Entered By: Allen Derry on 11/02/2022 06:35:36 -------------------------------------------------------------------------------- HPI Details Patient Name: Date of Service: Elizabeth Fields 11/02/2022 8:45 A M Medical Record Number: [id number] Patient Account Number: [id number] Date of Birth/Sex: Treating RN: March 04, 1931 (89 y.o. Freddy Finner Primary Care Provider: Marcelino Duster Other Clinician: Referring Provider: Treating Provider/Extender: Joylene Grapes in Treatment: 0 History of Present Illness HPI Description: 01/17/18 on evaluation today patient presents for evaluation of two ulcers both on the anterior portion of her lower extremities one right and one left. The right she has had for about three weeks the left for about eight weeks and both occurred as a result of her traumatizing her shins on the bottom of the card were trying to get out of the car. She states this is never happen with any other car doors before but she has been having some issues with this sharp area on the store in particular. Both occurred in the similar situation where she was trying not to open the door the whole way in order to avoid damaging anybody else's car beside them. She is been using peroxide initially to clean  the wound although she has converted to the wound cleanser now. She has been using if anything just over the counter triple antibiotic ointment and a Band-Aid. With that being said she's not even been using that more recently. The most part she's just been attempting allow this to dry out/scab over. Her ABIs were greater than 220 and noncompressible. Patient does have a history of paroxysmal atrial fibrillation for which she is on Coumadin. She also has chronic kidney disease stage III. She has been on antibiotics that I see no evidence of infection right now which is good news. Readmission: 11-02-2022 patient presents for initial inspection here in our clinic this is an individual whom I have seen before although it has been about 4 years since I last saw her. She does have atrial fibrillation she is on Coumadin regularly. With that being said she had a wound on the lower extremity right leg which was more of a venous leg ulcer and subsequently I did review Dr. Stasia Cavalier note in that regard and to be honest I completely agree with the assessment that that wound is indeed looking to be more of a venous leg ulcer that is on the distal/lateral aspect of the right leg. With that being said unfortunately we have a bigger issue going on here at this point in my opinion with the fact that she has multiple areas which are basically hemorrhagic regions on both lower extremities as well as Elizabeth Fields, Elizabeth Fields ([id number]) [id number] Page 2 of 9 bruising around her left periorbital area which has spontaneously been happening. I am concerned about the possibility that she may have issues with a INR that may be too elevated from what I could see it has not look like it has been checked since September 5 that the last which has  seeing right now is quite significant and I think that it is potential that her platelets and/or INR levels may be off. Again I think that she is to be checked as soon as possible. 2. Right now we see bilateral lower extremity hemorrhagic bullae which have spontaneously she showed up although she does have 1 larger bulla on the right lateral/posterior leg this is where she actually inadvertently hit herself with her cane and/or walker she tells me and this is the area where she is bleeding most profusely from the lower extremity. With regard to the area around the left periorbital area there was no injury here this just started bruising she tells me in the last 2 days. All this has me concerned about her INR levels and where things may be at this point. 3. With regard to the wounds we did put Xeroform gauze to keep anything from sticking and then ABD pads and roll gauze to secure in place with stretch net to get her to the ER for evaluation but I am sending her to the ER for evaluation ASAP at this point. We will see the patient back for a follow-up visit in 1 to 2 weeks following her ER visit in order to ensure that we can help take care of the wounds that are likely to show up especially from even these hemorrhagic areas. She is in agreement with that plan. In the meantime though I think the ideal thing is to get her to the ER ASAP to get checked as far as the bleeding is concerned I think this is the primary concern at the moment in my opinion. Electronic Signature(s) Signed: 11/02/2022 9:40:07 AM By: Allen Derry PA-Fields Entered By: Allen Derry on 11/02/2022 06:40:06 -------------------------------------------------------------------------------- ROS/PFSH Details Patient Name: Date of Service: Elizabeth Fields, Elizabeth Fields 11/02/2022 8:45 A Elizabeth Fields, Elizabeth Fields ([id number]) [id number] Page 7 of 9 Medical Record Number: [id number] Patient Account Number: [id number] Date of Birth/Sex:  Treating RN: [birth year] (89 y.o. Freddy Finner Primary Care Provider: Marcelino Duster Other Clinician: Referring Provider: Treating Provider/Extender: Joylene Grapes in Treatment: 0 Information Obtained From Patient Eyes Medical History: Negative for: Cataracts; Glaucoma; Optic Neuritis Ear/Nose/Mouth/Throat Medical History: Negative for: Chronic sinus problems/congestion; Middle ear problems Hematologic/Lymphatic Medical History: Negative for: Anemia; Hemophilia; Human Immunodeficiency Virus; Lymphedema; Sickle Cell Disease Respiratory Medical History: Negative for: Aspiration; Asthma; Chronic Obstructive Pulmonary Disease (COPD); Pneumothorax; Sleep Apnea; Tuberculosis Cardiovascular Medical History: Positive for: Congestive Heart Failure; Coronary Artery Disease Negative for: Angina; Arrhythmia; Deep Vein Thrombosis; Hypertension; Hypotension; Myocardial Infarction; Peripheral Arterial Disease; Peripheral Venous Disease; Phlebitis; Vasculitis Gastrointestinal Medical History: Negative for: Cirrhosis ; Colitis; Crohns; Hepatitis A; Hepatitis B; Hepatitis Fields Endocrine Medical History: Negative for: Type I Diabetes; Type II Diabetes Genitourinary Medical History: Positive for: End Stage Renal Disease Immunological Medical History: Negative for: Lupus Erythematosus; Raynauds; Scleroderma Integumentary (Skin) Medical History: Negative for: History of Burn; History of pressure wounds Musculoskeletal Medical History: Negative for: Gout; Rheumatoid Arthritis; Osteoarthritis; Osteomyelitis Neurologic Medical History: Negative for: Dementia; Neuropathy; Quadriplegia; Paraplegia Oncologic Medical History: Negative for: Received Chemotherapy; Received Radiation Psychiatric Elizabeth Fields, Elizabeth Fields ([id number]) [id number] Page 8 of 9 Medical History: Negative for: Anorexia/bulimia; Confinement Anxiety Immunizations Pneumococcal  Vaccine: Received Pneumococcal Vaccination: Yes Received Pneumococcal Vaccination On or After 60th Birthday: Yes Implantable Devices No devices added Family and Social History Cancer: Yes - Mother; Diabetes: Yes - Siblings; Heart Disease: Yes - Father,Siblings; Hereditary Spherocytosis: No; Hypertension: Yes - Siblings; Kidney Disease: Yes - Siblings; Lung  been about a month and a half as best I can tell going on close to 2 months. With that being said I am not sure if her INR is elevated beyond the 2.7 that it was but  she states that the bruising and the new areas that are popping up have just started more recently. She also has an area of hematoma/bleeding over the right lateral calf where she tells me that she hit her walker on this. Subsequently there is a big hemorrhagic hematoma at this location and subsequently this is also still continuing to bleed as soon as you remove what ever is on it it starts to profusely drain but it is dripping blood she tells me that she "has lost a lot of blood". With that being said I am concerned about the fact that her INR may be elevated at this point to be honest. I do not know what any recent levels would say but again the last 1 was 2.7 for the INR on September 13, 2022. That was before though she had all the issues with the spontaneous bleeding that we are dealing with at the moment specifically around the left periorbital region. Electronic Signature(s) Signed: 11/02/2022 9:37:04 AM By: Allen Derry PA-Fields Entered By: Allen Derry on 11/02/2022 06:37:04 -------------------------------------------------------------------------------- Physical Exam Details Patient Name: Date of Service: Elizabeth Fields, Elizabeth Fields 11/02/2022 8:45 A M Medical Record Number: [id number] Patient Account Number: [id number] Date of Birth/Sex: Treating RN: 1931/04/26 (89 y.o. Freddy Finner Primary Care Provider: Marcelino Duster Other Clinician: Referring Provider: Treating Provider/Extender: Joylene Grapes in Treatment: 0 Constitutional sitting or standing blood pressure is within target range for patient.. pulse regular and within target range for patient.Marland Kitchen respirations regular, non-labored and within target range for patient.Marland Kitchen temperature within target range for patient.. Thin and well-hydrated in no acute distress. Eyes conjunctiva clear no eyelid edema noted. pupils equal round and reactive to light and accommodation. Ears, Nose, Mouth, and Throat no gross abnormality of ear  auricles or external auditory canals. normal hearing noted during conversation. mucus membranes moist. Respiratory normal breathing without difficulty. Cardiovascular 1+ dorsalis pedis/posterior tibialis pulses. trace pitting edema of the bilateral lower extremities. Musculoskeletal normal gait and posture. no significant deformity or arthritic changes, no loss or range of motion, no clubbing. Psychiatric this patient is able to make decisions and demonstrates good insight into disease process. Alert and Oriented x 3. pleasant and cooperative. Notes Upon inspection patient's wound bed actually showed signs of having some small venous leg ulcerations noted on the right lateral leg this is what Dr. Laural Benes saw that he actually referred her to [place] for. With that being said unfortunately she is having more significant issues here at this point with in my opinion spontaneous areas of bleeding and hemorrhagic bullae which are noted over the bilateral lower extremities. She also has a large area of hemorrhagic bullae/hematoma over the right lateral leg and at this point that area in particular is bleeding pretty profusely when I just removed her dressing she tells me she has been having the drainage this frequently. I am most concerned about that above all else to be perfectly honest. At least coupled with the area of spontaneous bruising around her left periorbital area which again she has no trauma to and this just showed up she tells me in the last 24 to 48 hours. Electronic Signature(s) Signed: 11/02/2022 9:38:10 AM By: Allen Derry PA-Fields Entered By: Allen Derry on 11/02/2022 06:38:09 Berninger, Patt Fields (

## 2022-11-05 NOTE — Progress Notes (Signed)
Physical Therapy Treatment Patient Details Name: Elizabeth Fields MRN: [id number] DOB: 1931-04-29 Today's Date: 11/05/2022   History of Present Illness Elizabeth Fields is a 89 y.o. female with medical history significant of chronic venous stasis dermatitis with ulcers, atrial fibrillation on warfarin, aortic valve insufficiency, CAD, CKD stage IIIb, HFpEF, who presents to the ED due to leg pain.    PT Comments  Pt upright in recliner upon arrival and agreeable to therapy.  Pt noted to be ready to move a little today.  Pt able to ambulate with greater speed during session, however still requires verbal cuing for upright posture and forward gaze.  Pt then transferred back to the recliner with all needs met and family/visitors in the room.  Nephew stopped therapist outside of room and expressed concern about pt returning home without any support.  He expressed obtaining information for assistance for her to maintain in the home if possible.  Therapist reached out to care management team via secure chat to express the concerns voices by nephew.     If plan is discharge home, recommend the following: A little help with walking and/or transfers;A little help with bathing/dressing/bathroom;Help with stairs or ramp for entrance;Assist for transportation   Can travel by private vehicle        Equipment Recommendations  None recommended by PT    Recommendations for Other Services       Precautions / Restrictions Restrictions Weight Bearing Restrictions: No     Mobility  Bed Mobility               General bed mobility comments: pt upright in recliner and returned to recliner.    Transfers Overall transfer level: Needs assistance Equipment used: Rolling walker (2 wheels) Transfers: Sit to/from Stand Sit to Stand: Min assist           General transfer comment: Pt required minA from recliner and verbal cues for proper hand placement.    Ambulation/Gait Ambulation/Gait assistance:  Contact guard assist Gait Distance (Feet): 160 Feet Assistive device: Rolling walker (2 wheels) Gait Pattern/deviations: Trunk flexed, Step-through pattern Gait velocity: decreased     General Gait Details: pt with forward flexed posture and small steps.  Pt notes that when she looks up, she loses her balance.   Stairs             Wheelchair Mobility     Tilt Bed    Modified Rankin (Stroke Patients Only)       Balance Overall balance assessment: Needs assistance Sitting-balance support: No upper extremity supported, Feet unsupported Sitting balance-[name]: Good     Standing balance support: Bilateral upper extremity supported, During functional activity, Reliant on assistive device for balance Standing balance-[name]: Fair                              Cognition Arousal: Alert   Overall Cognitive Status: Within Functional Limits for tasks assessed                                          Exercises      General Comments        Pertinent Vitals/Pain Pain Assessment Pain Assessment: No/denies pain    Home Living  Prior Function            PT Goals (current goals can now be found in the care plan section) Acute Rehab PT Goals Patient Stated Goal: to go back home. PT Goal Formulation: With patient Time For Goal Achievement: 11/17/22 Potential to Achieve Goals: Fair Progress towards PT goals: Progressing toward goals    Frequency    Min 1X/week      PT Plan      Co-evaluation              AM-PAC PT "6 Clicks" Mobility   Outcome Measure  Help needed turning from your back to your side while in a flat bed without using bedrails?: A Little Help needed moving from lying on your back to sitting on the side of a flat bed without using bedrails?: A Little Help needed moving to and from a bed to a chair (including a wheelchair)?: A Little Help needed standing up  from a chair using your arms (e.g., wheelchair or bedside chair)?: A Little Help needed to walk in hospital room?: A Little Help needed climbing 3-5 steps with a railing? : A Little 6 Click Score: 18    End of Session Equipment Utilized During Treatment: Gait belt Activity Tolerance: Patient tolerated treatment well Patient left: in chair;with call bell/phone within reach;with chair alarm set;with nursing/sitter in room Nurse Communication: Mobility status PT Visit Diagnosis: Unsteadiness on feet (R26.81);Other abnormalities of gait and mobility (R26.89);Muscle weakness (generalized) (M62.81);History of falling (Z91.81);Difficulty in walking, not elsewhere classified (R26.2)     Time: 1044-1100 PT Time Calculation (min) (ACUTE ONLY): 16 min  Charges:    $Therapeutic Activity: 8-22 mins PT General Charges $$ ACUTE PT VISIT: 1 Visit                     Nolon Bussing, PT, DPT Physical Therapist - [facility]  11/05/22, 1:52 PM

## 2022-11-05 NOTE — Consult Note (Signed)
WOC Nurse Consult Note: Reason for Consult:Patient with venous insufficiency presents with hemorrhagic bullae to right lateral leg and skin tear to right anterior lower leg with profuse bleeding on arrival to ED.  Pressure dressings applied, COumadin stopped and blood products transfused due to low hemoglobin.  Cancelled wound care appointment and she understands the need to follow up with them for ongoing wound healing after discharge.   Wound type: Trauma, inflammatory Pressure Injury POA: NA Measurement: RIght anterior lower leg with skin tear 8 cm skin flap, not approximated Right lateral lower leg with large Bullae present 6 cmx 4.5 cm  Bilateral lower legs with violaceous macules and papules present.   IS followed by dermatology outpatient. Was given TMC 0.1 % ointment.  Wound bed: REd and purple, bleeds with care Drainage (amount, consistency, odor)  bleeding with care Periwound: Bruising, thin epithelium due to chronic topical steroid use.  Dressing procedure/placement/frequency: CLeanse bilateral lower legs with soap and water and pat gently dry.  TO nonitnact skin, apply silicone nonadherent contact layer (Mepitel)  (LAWSON [id number]) - NOT silicone foam dressing to wound bed.  THis will aid in atraumatic dressing removal.  If Mepitel not available, use double layer of vaseline gauze.   Wrap legs with kerlix and tape. IF bleeding, may secure with Coban to right leg for modified compression.  Change on MOnday and THursday.  Will not follow at this time.  Please re-consult if needed.  Mike Gip MSN, RN, FNP-BC CWON Wound, Ostomy, Continence Nurse Outpatient [facility] [phone] Pager [phone]

## 2022-11-05 NOTE — Progress Notes (Signed)
Progress Note   Patient: Elizabeth Fields OZD:[id number] DOB: [birth year] DOA: 11/02/2022     2 DOS: the patient was seen and examined on 11/05/2022   Brief hospital course: 89 year old female past medical history of chronic venous stasis dermatitis with ulcers, atrial fibrillation on warfarin, severe tricuspid regurgitation, mild aortic regurgitation and mitral regurgitation.  She was sent in from the wound care center for bleeding lower extremity wounds.  In the ER, they placed a pressure wrap.  INR 6.6 on presentation patient given a dose of vitamin K.  10/26.  Hemoglobin drifted down to 7.6.  (Back in August hemoglobin was 11.0).  Will give a unit of blood.  Continue to hold Coumadin.  Today's INR 3.2.  Will need to look at her wounds tomorrow to determine on what to do with Coumadin. 10/27.  Hemoglobin up to 10.9 after transfusion.  INR 2.5.  With unwrapping the dressing still has some bleeding right lower extremity.  Will continue to hold Coumadin apply pressure dressing and reevaluate tomorrow. 10/28.  Hemoglobin 9.0, creatinine 1.07.  INR 2.2.  Continue to hold Coumadin.  Did not see any active bleeding on the anterior skin tear but did have a little oozing from the large bullae.  Assessment and Plan: * Acute blood loss anemia Patient received 1 unit of blood and IV iron while here.  Today's hemoglobin is 9.0 and yesterday 10.9.  Watch hemoglobin another day.  Supratherapeutic INR Received 1 dose of vitamin K in the emergency room.  Today's INR 2.2.   Likely will have to go home without Coumadin.  Venous stasis dermatitis of both lower extremities History of chronic venous stasis ulcers of bilateral lower extremities, for which she follows with both dermatology and the wound center.  There is now hemorrhagic bullae.  Appreciate wound care nurse consultation.  Atrial fibrillation (HCC) Chronic in nature.  Warfarin on hold secondary to anemia and bleeding and venous stasis ulcers.  Continue  metoprolol.  Chronic kidney disease, stage III (moderate) (HCC) CKD stage IIIb.  Creatinine 1.07 with a GFR 50 today.  Hold Lasix.  (HFpEF) heart failure with preserved ejection fraction (HCC) Patient has a history of HFpEF with last EF of 50%, last evaluated in 2021. Euvolemic at this time.  -Patient on Toprol.  Hold oral Lasix.  Hypothyroidism Continue levothyroxine  Chronic pain syndrome On tramadol        Subjective: Patient feeling okay.  Right leg has some burning.  Appetite okay.  Came in with supratherapeutic INR and right lower extremity bleeding.  Physical Exam: Vitals:   11/04/22 0814 11/04/22 1526 11/04/22 2014 11/05/22 0002  BP: 104/60 (!) 122/49 (!) 92/41 (!) 130/112  Pulse: 100 83 76 81  Resp: 18 18 17    Temp: 98 F (36.7 C) 98.8 F (37.1 C) 98.7 F (37.1 C)   TempSrc: Oral  Oral   SpO2: 100% 95% 100% 100%  Weight:      Height:       Physical Exam HENT:     Head: Normocephalic.     Mouth/Throat:     Pharynx: No oropharyngeal exudate.  Eyes:     General: Lids are normal.  Cardiovascular:     Rate and Rhythm: Normal rate. Rhythm irregularly irregular.     Heart sounds: Normal heart sounds, S1 normal and S2 normal.  Pulmonary:     Breath sounds: No decreased breath sounds, wheezing, rhonchi or rales.  Abdominal:     Palpations: Abdomen is soft.  Tenderness: There is no abdominal tenderness.  Musculoskeletal:     Right lower leg: No swelling.     Left lower leg: No swelling.  Skin:    General: Skin is warm.     Comments: Right leg a large skin tear no active bleeding.  Right leg large hematoma/bullae slight oozing.  Left leg has a few scabbed lesions no active bleeding.  Neurological:     Mental Status: She is alert.     Data Reviewed: INR 2.2, hemoglobin 9.0, creatinine 1.07  Family Communication: Spoke with nephew on the phone  Disposition: Status is: Inpatient Remains inpatient appropriate because: Continue to monitor here in the  hospital.  Nephew looking into more care at home.  Will need to set up home health.  With hemoglobin drifting down will watch hemoglobin another day.  Continue to hold Coumadin.  Planned Discharge Destination: Home with Home Health    Time spent: 28 minutes  Author: Alford Highland, MD 11/05/2022 1:52 PM  For on call review [email or web address].

## 2022-11-06 DIAGNOSIS — E871 Hypo-osmolality and hyponatremia: Secondary | ICD-10-CM

## 2022-11-06 DIAGNOSIS — N179 Acute kidney failure, unspecified: Secondary | ICD-10-CM

## 2022-11-06 DIAGNOSIS — I872 Venous insufficiency (chronic) (peripheral): Secondary | ICD-10-CM | POA: Diagnosis not present

## 2022-11-06 DIAGNOSIS — N189 Chronic kidney disease, unspecified: Secondary | ICD-10-CM

## 2022-11-06 DIAGNOSIS — E038 Other specified hypothyroidism: Secondary | ICD-10-CM

## 2022-11-06 DIAGNOSIS — E876 Hypokalemia: Secondary | ICD-10-CM

## 2022-11-06 DIAGNOSIS — R791 Abnormal coagulation profile: Secondary | ICD-10-CM | POA: Diagnosis not present

## 2022-11-06 DIAGNOSIS — I959 Hypotension, unspecified: Secondary | ICD-10-CM

## 2022-11-06 DIAGNOSIS — D62 Acute posthemorrhagic anemia: Secondary | ICD-10-CM | POA: Diagnosis not present

## 2022-11-06 DIAGNOSIS — I9589 Other hypotension: Secondary | ICD-10-CM

## 2022-11-06 LAB — BASIC METABOLIC PANEL
Anion gap: 7 (ref 5–15)
BUN: 22 mg/dL (ref 8–23)
CO2: 25 mmol/L (ref 22–32)
Calcium: 7.7 mg/dL — ABNORMAL LOW (ref 8.9–10.3)
Chloride: 101 mmol/L (ref 98–111)
Creatinine, Ser: 0.95 mg/dL (ref 0.44–1.00)
GFR, Estimated: 57 mL/min — ABNORMAL LOW (ref >60)
Glucose, Bld: 92 mg/dL (ref 70–99)
Potassium: 3.3 mmol/L — ABNORMAL LOW (ref 3.5–5.1)
Sodium: 133 mmol/L — ABNORMAL LOW (ref 135–145)

## 2022-11-06 LAB — PROTIME-INR
INR: 1.9 — ABNORMAL HIGH (ref 0.8–1.2)
Prothrombin Time: 21.8 s — ABNORMAL HIGH (ref 11.4–15.2)

## 2022-11-06 LAB — HEMOGLOBIN: Hemoglobin: 9.4 g/dL — ABNORMAL LOW (ref 12.0–15.0)

## 2022-11-06 MED ORDER — MELATONIN 5 MG PO TABS
5.0000 mg | ORAL_TABLET | Freq: Every day | ORAL | Status: DC
  Administered 2022-11-06 – 2022-11-07 (×2): 5 mg via ORAL
  Filled 2022-11-06 (×2): qty 1

## 2022-11-06 MED ORDER — POTASSIUM CHLORIDE CRYS ER 20 MEQ PO TBCR
20.0000 meq | EXTENDED_RELEASE_TABLET | Freq: Once | ORAL | Status: AC
  Administered 2022-11-06: 20 meq via ORAL
  Filled 2022-11-06: qty 1

## 2022-11-06 MED ORDER — METOPROLOL SUCCINATE ER 50 MG PO TB24
50.0000 mg | ORAL_TABLET | Freq: Every day | ORAL | Status: DC
  Administered 2022-11-07 – 2022-11-08 (×2): 50 mg via ORAL
  Filled 2022-11-06 (×2): qty 1

## 2022-11-06 NOTE — Assessment & Plan Note (Signed)
Potassium 3.3

## 2022-11-06 NOTE — Assessment & Plan Note (Signed)
Acute kidney injury on CKD stage IIIb.  Creatinine 1.69 on 10/27.  Held Lasix for 2 days and creatinine improved to 0.95.

## 2022-11-06 NOTE — Progress Notes (Signed)
Physical Therapy Treatment Patient Details Name: Elizabeth Fields MRN: [id number] DOB: [birth year] Today's Date: 11/06/2022   History of Present Illness Elizabeth Fields is a 89 y.o. female with medical history significant of chronic venous stasis dermatitis with ulcers, atrial fibrillation on warfarin, aortic valve insufficiency, CAD, CKD stage IIIb, HFpEF, who presents to the ED due to leg pain.    PT Comments  Pt resting in bed upon PT arrival; agreeable to therapy.  0/10 pain at rest beginning of session; 2-3/10 R lower leg pain with activity/end of session at rest (nurse notified of pt's pain status).  During session pt modified independent semi-supine to sitting EOB; CGA with transfers using RW; and CGA to ambulate 200 feet with RW use.  Pt initially hesitant to put any weight through R LE d/t pain (increased effort/time to stand initially d/t this) but pt quickly able to progress and tolerate putting weight through R LE.  Practiced sit to stand transfers during session to improve technique d/t R LE pain concerns: pt did well practicing and improving technique with transfers.  Will continue to focus on strengthening and progressive functional mobility during hospitalization.   If plan is discharge home, recommend the following: A little help with walking and/or transfers;A little help with bathing/dressing/bathroom;Help with stairs or ramp for entrance;Assist for transportation   Can travel by private vehicle      Yes  Equipment Recommendations  Rolling walker (2 wheels) (youth sized)    Recommendations for Other Services       Precautions / Restrictions Precautions Precautions: Fall Restrictions Weight Bearing Restrictions: No     Mobility  Bed Mobility Overal bed mobility: Modified Independent             General bed mobility comments: Semi-supine to sitting EOB (HOB elevated)    Transfers Overall transfer level: Needs assistance Equipment used: Rolling walker (2  wheels) Transfers: Sit to/from Stand Sit to Stand: Contact guard assist           General transfer comment: x2 trials from bed; x5 trials from recliner (use of armrests); x5 trials from recliner (pushing off of seat of chair instead of armrests); initial vc's for UE/LE positioning/overall technique    Ambulation/Gait Ambulation/Gait assistance: Contact guard assist Gait Distance (Feet): 200 Feet Assistive device: Rolling walker (2 wheels) Gait Pattern/deviations: Step-through pattern, Decreased step length - right, Decreased step length - left Gait velocity: decreased     General Gait Details: mildly flexed posture; antalgic; decreased stance time R LE (improved with increased distance ambulating); steady with RW use   Stairs             Wheelchair Mobility     Tilt Bed    Modified Rankin (Stroke Patients Only)       Balance Overall balance assessment: Needs assistance Sitting-balance support: No upper extremity supported, Feet unsupported Sitting balance-[name]: Good Sitting balance - Comments: steady reaching within BOS   Standing balance support: Bilateral upper extremity supported, During functional activity, Reliant on assistive device for balance Standing balance-[name]: Good Standing balance comment: steady ambulating with RW use                            Cognition Arousal: Alert Behavior During Therapy: WFL for tasks assessed/performed Overall Cognitive Status: Within Functional Limits for tasks assessed  Exercises      General Comments General comments (skin integrity, edema, etc.): R LE dressings in place.  Pt agreeable to PT session.      Pertinent Vitals/Pain Pain Assessment Pain Assessment: 0-10 Pain Score: 3  Pain Location: R lower leg pain Pain Descriptors / Indicators: Aching, Tender (throbbing in standing) Pain Intervention(s): Limited activity within  patient's tolerance, Monitored during session, Repositioned HR 100 bpm (at rest) to 128 bpm (with activity) during session.; SpO2 sats 99% on room air      Home Living                          Prior Function            PT Goals (current goals can now be found in the care plan section) Acute Rehab PT Goals Patient Stated Goal: to go back home. PT Goal Formulation: With patient Time For Goal Achievement: 11/17/22 Potential to Achieve Goals: Fair Progress towards PT goals: Progressing toward goals    Frequency    Min 1X/week      PT Plan      Co-evaluation              AM-PAC PT "6 Clicks" Mobility   Outcome Measure  Help needed turning from your back to your side while in a flat bed without using bedrails?: A Little Help needed moving from lying on your back to sitting on the side of a flat bed without using bedrails?: A Little Help needed moving to and from a bed to a chair (including a wheelchair)?: A Little Help needed standing up from a chair using your arms (e.g., wheelchair or bedside chair)?: A Little Help needed to walk in hospital room?: A Little Help needed climbing 3-5 steps with a railing? : A Little 6 Click Score: 18    End of Session Equipment Utilized During Treatment: Gait belt Activity Tolerance: Patient tolerated treatment well Patient left: in chair;with call bell/phone within reach;with chair alarm set;with family/visitor present;Other (comment) (B LE's elevated via pillow) Nurse Communication: Mobility status;Precautions;Other (comment) (Pt's pain status) PT Visit Diagnosis: Unsteadiness on feet (R26.81);Other abnormalities of gait and mobility (R26.89);Muscle weakness (generalized) (M62.81);History of falling (Z91.81);Difficulty in walking, not elsewhere classified (R26.2)     Time: 4098-1191 PT Time Calculation (min) (ACUTE ONLY): 27 min  Charges:    $Gait Training: 8-22 mins $Therapeutic Activity: 8-22 mins PT General  Charges $$ ACUTE PT VISIT: 1 Visit                     Hendricks Limes, PT 11/06/22, 11:03 AM

## 2022-11-06 NOTE — Care Management Important Message (Signed)
Important Message  Patient Details  Name: Elizabeth Fields MRN: [id number] Date of Birth: February 11, 1931   Important Message Given:  N/A - LOS <3 / Initial given by admissions     Elizabeth Fields 11/06/2022, 12:06 PM

## 2022-11-06 NOTE — Assessment & Plan Note (Signed)
Blood pressure on the lower side.  Cut back the dose of Toprol down to 50 mg daily

## 2022-11-06 NOTE — Consult Note (Signed)
[facility] Liaison Note  11/06/2022  Elizabeth Fields [birth year] [id number]  Location: screened the patient remotely at [facility] ED.  Insurance: [employer]   Elizabeth Fields is a 89 y.o. female who is a Primary Care Patient of Gracelyn Nurse, MD-[facility]. The patient was screened for  day readmission hospitalization with noted high risk score for unplanned readmission risk with 1 IP in 6 months.  The patient was assessed for potential Care Management service needs for post hospital transition for care coordination. Review of patient's electronic medical record reveals patient was admitted for acute blood loss anemia. Pt  listed in [place] with [facility]. Liaison spoke with [name] ([facility]) and inquired further on pt's status. Informed pt has declined services in the past however will be put into "que" for another out reach and offer possible care management services via [facility].  No additional anticipated needs at this time.    Plan: [facility] Liaison will continue to follow progress and disposition to asess for post hospital community care coordination/management needs.  Referral request for community care coordination: pending disposition.Marland Kitchen   [facility] Care Management/Population Health does not replace or interfere with any arrangements made by the Inpatient Transition of Care team.   For questions contact:   Elliot Cousin, RN, [facility] Liaison [place]   [facility], Population Health Office Hours MTWF  8:00 am-6:00 pm Direct Dial: [phone] mobile [phone] [Office toll free line] Office Hours are M-F 8:30 - 5 pm [email or web address]

## 2022-11-06 NOTE — Evaluation (Signed)
Occupational Therapy Evaluation Patient Details Name: Elizabeth Fields MRN: [id number] DOB: Feb 14, 1931 Today's Date: 11/06/2022   History of Present Illness Elizabeth Fields is a 89 y.o. female with medical history significant of chronic venous stasis dermatitis with ulcers, atrial fibrillation on warfarin, aortic valve insufficiency, CAD, CKD stage IIIb, HFpEF, who presents to the ED due to leg pain.   Clinical Impression   Pt was seen for OT evaluation this date. Prior to hospital admission, pt was living alone and IND with ADLs and mobility. She did not drive.  Pt presents to acute OT demonstrating impaired ADL performance and functional mobility 2/2 weakness, BLE pain, mild balance deficits and limited activity tolerance d/t pain (See OT problem list for additional functional deficits). Pt currently requires Min A for LB dressing to don sock over LE dressings. She performed STS from recliner with CGA and ambulated from recliner<>bathroom using RW with CGA/SBA. Pt required SBA for toilet transfer using grab bars and performed hygiene and clothing management with SBA. OT educated on safety and compensatory strategies to use at home, e.g. sitting to perform tasks as able, using unilateral support on grab bar or walker during dynamic balance activities/ADLs, etc with pt verbalizing understanding. Spoke with nephew regarding his concerns for having someone with his aunt during night time hours. Recommended use of her BSC during the night when she wakes up groggy for safety.  Pt would benefit from skilled OT services to address noted impairments and functional limitations (see below for any additional details) in order to maximize safety and independence while minimizing falls risk and caregiver burden. Do anticipate the need for follow up OT services upon acute hospital DC. Recommend HHOT consult.       If plan is discharge home, recommend the following: A little help with walking and/or transfers;A little  help with bathing/dressing/bathroom;Assistance with cooking/housework;Assist for transportation;Help with stairs or ramp for entrance    Functional Status Assessment  Patient has had a recent decline in their functional status and demonstrates the ability to make significant improvements in function in a reasonable and predictable amount of time.  Equipment Recommendations  None recommended by OT (pt reports having necessary DME)    Recommendations for Other Services       Precautions / Restrictions Precautions Precautions: Fall Restrictions Weight Bearing Restrictions: No      Mobility Bed Mobility               General bed mobility comments: NT in recliner on entry    Transfers Overall transfer level: Needs assistance Equipment used: Rolling walker (2 wheels) Transfers: Sit to/from Stand Sit to Stand: Contact guard assist           General transfer comment: CGA from recliner and SBA from toilet using grab bar-reports same setup in her home      Balance Overall balance assessment: Needs assistance Sitting-balance support: No upper extremity supported, Feet unsupported Sitting balance-[name]: Good Sitting balance - Comments: steady to doff and don socks seated in recliner   Standing balance support: Bilateral upper extremity supported, During functional activity, Reliant on assistive device for balance Standing balance-[name]: Good Standing balance comment: steady ambulating with RW use                           ADL either performed or assessed with clinical judgement   ADL Overall ADL's : Needs assistance/impaired Eating/Feeding: Set up   Grooming: Wash/dry hands;Supervision/safety;Standing  Lower Body Dressing: Minimal assistance Lower Body Dressing Details (indicate cue type and reason): MIN A to don over dressings on R foot, but able to perform on L foot with SUP/SBA Toilet Transfer: Supervision/safety;Grab  bars;Regular Toilet;Rolling walker (2 wheels)   Toileting- Clothing Manipulation and Hygiene: Supervision/safety;Sit to/from stand;Sitting/lateral lean;Cueing for safety       Functional mobility during ADLs: Contact guard assist;Rolling walker (2 wheels) General ADL Comments: CGA/SBA for mobility to bathroom and back     Vision         Perception         Praxis         Pertinent Vitals/Pain Pain Assessment Pain Assessment: 0-10 Pain Score: 3  Pain Location: R lower leg pain Pain Descriptors / Indicators: Aching, Tender Pain Intervention(s): Monitored during session, Limited activity within patient's tolerance     Extremity/Trunk Assessment Upper Extremity Assessment Upper Extremity Assessment: Generalized weakness   Lower Extremity Assessment Lower Extremity Assessment: Generalized weakness   Cervical / Trunk Assessment Cervical / Trunk Assessment: Kyphotic   Communication Communication Communication: Hearing impairment (HOH (hears better from L ear)) Cueing Techniques: Verbal cues;Visual cues   Cognition Arousal: Alert Behavior During Therapy: WFL for tasks assessed/performed Overall Cognitive Status: Within Functional Limits for tasks assessed                                       General Comments  RLE dressings in place    Exercises Other Exercises Other Exercises: Educated on compensatory/safety techniques to use if returning home to prevent falls and decrease pain in RLE.   Shoulder Instructions      Home Living Family/patient expects to be discharged to:: Private residence Living Arrangements: Alone Available Help at Discharge: Family;Neighbor;Available PRN/intermittently Type of Home: House Home Access: Stairs to enter [employer] of Steps: 2-3 Entrance Stairs-Rails: Left Home Layout: One level     Bathroom Shower/Tub: Tub/shower unit (has been sponge bathing more recently d/t L wound)   Bathroom Toilet:  Standard     Home Equipment: Agricultural consultant (2 wheels);Cane - single point;Grab bars - toilet          Prior Functioning/Environment Prior Level of Function : Needs assist               ADLs Comments: Pt unable to drive, so her neighbors have been getting her groceries. Reports sponge bathing standing at sink d/t LE ulcer        OT Problem List: Decreased strength;Impaired balance (sitting and/or standing);Pain      OT Treatment/Interventions: Self-care/ADL training;Therapeutic exercise;Patient/family education;Balance training;Therapeutic activities;DME and/or AE instruction    OT Goals(Current goals can be found in the care plan section) Acute Rehab OT Goals Patient Stated Goal: return home OT Goal Formulation: With patient/family Time For Goal Achievement: 11/20/22 Potential to Achieve Goals: Good ADL Goals Pt Will Perform Lower Body Bathing: sitting/lateral leans;sit to/from stand;with modified independence Pt Will Perform Lower Body Dressing: with modified independence;sit to/from stand;sitting/lateral leans Pt Will Transfer to Toilet: with modified independence;ambulating;grab bars Pt Will Perform Toileting - Clothing Manipulation and hygiene: with modified independence;sitting/lateral leans;sit to/from stand Additional ADL Goal #1: Pt will verbalize appropriate safety and compensatory strategies during ADL performance and daily routine to remain safe at home.  OT Frequency: Min 1X/week    Co-evaluation              AM-PAC OT "6 Clicks"  Daily Activity     Outcome Measure Help from another person eating meals?: None Help from another person taking care of personal grooming?: None Help from another person toileting, which includes using toliet, bedpan, or urinal?: A Little Help from another person bathing (including washing, rinsing, drying)?: A Little Help from another person to put on and taking off regular upper body clothing?: None Help from another  person to put on and taking off regular lower body clothing?: A Little 6 Click Score: 21   End of Session Equipment Utilized During Treatment: Rolling walker (2 wheels) Nurse Communication: Mobility status  Activity Tolerance: Patient tolerated treatment well Patient left: in chair;with call bell/phone within reach;with chair alarm set;with family/visitor present  OT Visit Diagnosis: Other abnormalities of gait and mobility (R26.89);Muscle weakness (generalized) (M62.81)                Time: 5643-3295 OT Time Calculation (min): 38 min Charges:  OT General Charges $OT Visit: 1 Visit OT Evaluation $OT Eval Low Complexity: 1 Low OT Treatments $Self Care/Home Management : 23-37 mins Jaylee Lantry, OTR/L 11/06/22, 1:57 PM Tyniesha Howald E Brynlyn Dade 11/06/2022, 1:53 PM

## 2022-11-06 NOTE — Plan of Care (Signed)

## 2022-11-06 NOTE — Progress Notes (Signed)
Progress Note   Patient: Elizabeth Fields ZDG:[id number] DOB: 1931-10-30 DOA: 11/02/2022     3 DOS: the patient was seen and examined on 11/06/2022   Brief hospital course: 89 year old female past medical history of chronic venous stasis dermatitis with ulcers, atrial fibrillation on warfarin, severe tricuspid regurgitation, mild aortic regurgitation and mitral regurgitation.  She was sent in from the wound care center for bleeding lower extremity wounds.  In the ER, they placed a pressure wrap.  INR 6.6 on presentation patient given a dose of vitamin K.  10/26.  Hemoglobin drifted down to 7.6.  (Back in August hemoglobin was 11.0).  Will give a unit of blood.  Continue to hold Coumadin.  Today's INR 3.2.  Will need to look at her wounds tomorrow to determine on what to do with Coumadin. 10/27.  Hemoglobin up to 10.9 after transfusion.  INR 2.5.  With unwrapping the dressing still has some bleeding right lower extremity.  Will continue to hold Coumadin apply pressure dressing and reevaluate tomorrow. 10/28.  Hemoglobin 9.0, creatinine 1.07.  INR 2.2.  Continue to hold Coumadin.  Did not see any active bleeding on the anterior skin tear but did have a little oozing from the large bullae. 10/29.  Hemoglobin 9.4, creatinine 0.95, INR 1.9.  Continue to hold Coumadin.  Patient's nephew very concerned that the patient lives alone and unable not be able to take care of herself.  He is looking into more care at home besides the home health.  Assessment and Plan: * Acute blood loss anemia Patient received 1 unit of blood and IV iron while here.  Today's hemoglobin is 9.4.  Supratherapeutic INR Received 1 dose of vitamin K in the emergency room.  Today's INR 1.9.   Likely will have to go home without Coumadin.  Acute kidney injury superimposed on CKD (HCC) Acute kidney injury on CKD stage IIIb.  Creatinine 1.69 on 10/27.  Held Lasix for 2 days and creatinine improved to 0.95.  Venous stasis dermatitis of  both lower extremities History of chronic venous stasis ulcers of bilateral lower extremities, for which she follows with both dermatology and the wound center.  There is now hemorrhagic bullae right leg and large skin tear right leg.  Appreciate wound care nurse consultation.  Hypotension Blood pressure on the lower side.  Cut back the dose of Toprol down to 50 mg daily  Atrial fibrillation (HCC) Chronic in nature.  Warfarin on hold secondary to anemia and bleeding and venous stasis ulcers.  Continue metoprolol (with blood pressure being on the lower side will have to dose of metoprolol)  Hypokalemia Potassium 3.3  Hyponatremia Sodium 133.  (HFpEF) heart failure with preserved ejection fraction (HCC) Patient has a history of HFpEF with last EF of 50%, last evaluated in 2021. Euvolemic at this time.  -Patient on Toprol.  Hold oral Lasix.  Hypothyroidism Continue levothyroxine  Chronic pain syndrome On tramadol        Subjective: Patient's blood pressure little bit on the lower side this morning.  Cut back the dose of metoprolol.  Patient feels okay.  No complaints of lightheadedness or dizziness.  Physical Exam: Vitals:   11/05/22 1943 11/06/22 0426 11/06/22 0726 11/06/22 1026  BP: (!) 103/54 (!) 111/49 (!) 110/45 (!) 104/49  Pulse: 98 87 74 (!) 104  Resp:   19 16  Temp: 98.2 F (36.8 C) (!) 97.5 F (36.4 C) 98.4 F (36.9 C)   TempSrc: Oral Oral    SpO2:  100% 100% 100% 100%  Weight:      Height:       Physical Exam HENT:     Head: Normocephalic.     Mouth/Throat:     Pharynx: No oropharyngeal exudate.  Eyes:     General: Lids are normal.  Cardiovascular:     Rate and Rhythm: Normal rate. Rhythm irregularly irregular.     Heart sounds: S1 normal and S2 normal. Murmur heard.     Systolic murmur is present with a grade of 2/6.  Pulmonary:     Breath sounds: No decreased breath sounds, wheezing, rhonchi or rales.  Abdominal:     Palpations: Abdomen is soft.      Tenderness: There is no abdominal tenderness.  Musculoskeletal:     Right lower leg: No swelling.     Left lower leg: No swelling.  Skin:    General: Skin is warm.     Comments: Right leg a large skin tear no active bleeding.  Right leg large hematoma/bullae slight oozing.  Left leg has a few scabbed lesions no active bleeding.  Neurological:     Mental Status: She is alert.     Data Reviewed: INR 1.9, hemoglobin 9.4, creatinine 0.95, sodium 133, potassium 3.3  Family Communication: Spoke with nephew at the bedside  Disposition: Status is: Inpatient Remains inpatient appropriate because: Nephew very concerned about her living alone and taking care of herself with these wounds.  He is looking into more care at home.  Planned Discharge Destination: Home with Home Health    Time spent: 29 minutes  Author: Alford Highland, MD 11/06/2022 5:10 PM  For on call review [email or web address].

## 2022-11-06 NOTE — TOC Progression Note (Signed)
Transition of Care [facility]) - Progression Note    Patient Details  Name: Elizabeth Fields MRN: [id number] Date of Birth: [birth year]  Transition of [facility]) CM/SW Contact  Allena Katz, LCSW Phone Number: 11/06/2022, 11:24 AM  Clinical Narrative:   CSW met patient and nephew at bedside to discuss [facility] and PCS services. Informed patient she has the benefit that covers 20 hours of PCS services through health team. Informed pt of recommended [facility] services. Pt reports she wants to talk with her insurance agent first before she makes any decisions. Pt states that she lives alone but has a nephew who takes her to appointments. Pt is following up at [facility] wound center and would like to continue going there. Pt is being seen by Dr. Marcelino Duster for primary care and is active with them. Pt reports that she will get back to me on if she wants referral to be made for PCS and HH. Pt informed if she leaves the hospital and decides her PCP can always arrange as well.     Expected Discharge Plan: Home w Home Health Services Barriers to Discharge: Continued Medical Work up  Expected Discharge Plan and Services     Post Acute Care Choice: Durable Medical Equipment Living arrangements for the past 2 months: Single Family Home                           HH Arranged: PT, OT           Social Determinants of Health (SDOH) Interventions SDOH Screenings   Food Insecurity: No Food Insecurity (11/02/2022)  Housing: Low Risk  (11/02/2022)  Transportation Needs: No Transportation Needs (11/02/2022)  Utilities: Not At Risk (11/02/2022)  Depression (PHQ2-9): Low Risk  (04/19/2020)  Financial Resource Strain: Low Risk  (08/21/2022)   Received from [facility]  Tobacco Use: Low Risk  (11/02/2022)    Readmission Risk Interventions     No data to display

## 2022-11-06 NOTE — Assessment & Plan Note (Signed)
Sodium -133

## 2022-11-07 DIAGNOSIS — E872 Acidosis, unspecified: Secondary | ICD-10-CM | POA: Insufficient documentation

## 2022-11-07 DIAGNOSIS — N179 Acute kidney failure, unspecified: Secondary | ICD-10-CM | POA: Diagnosis not present

## 2022-11-07 DIAGNOSIS — N189 Chronic kidney disease, unspecified: Secondary | ICD-10-CM | POA: Diagnosis not present

## 2022-11-07 DIAGNOSIS — D62 Acute posthemorrhagic anemia: Secondary | ICD-10-CM | POA: Diagnosis not present

## 2022-11-07 DIAGNOSIS — R791 Abnormal coagulation profile: Secondary | ICD-10-CM | POA: Diagnosis not present

## 2022-11-07 LAB — CULTURE, BLOOD (ROUTINE X 2)
Culture: NO GROWTH
Special Requests: ADEQUATE

## 2022-11-07 LAB — BASIC METABOLIC PANEL
Anion gap: 9 (ref 5–15)
BUN: 21 mg/dL (ref 8–23)
CO2: 21 mmol/L — ABNORMAL LOW (ref 22–32)
Calcium: 7.9 mg/dL — ABNORMAL LOW (ref 8.9–10.3)
Chloride: 106 mmol/L (ref 98–111)
Creatinine, Ser: 0.92 mg/dL (ref 0.44–1.00)
GFR, Estimated: 60 mL/min — ABNORMAL LOW (ref >60)
Glucose, Bld: 101 mg/dL — ABNORMAL HIGH (ref 70–99)
Potassium: 4 mmol/L (ref 3.5–5.1)
Sodium: 136 mmol/L (ref 135–145)

## 2022-11-07 LAB — PROTIME-INR
INR: 1.4 — ABNORMAL HIGH (ref 0.8–1.2)
Prothrombin Time: 17.7 s — ABNORMAL HIGH (ref 11.4–15.2)

## 2022-11-07 LAB — CBC
HCT: 26.8 % — ABNORMAL LOW (ref 36.0–46.0)
Hemoglobin: 8.8 g/dL — ABNORMAL LOW (ref 12.0–15.0)
MCH: 31.1 pg (ref 26.0–34.0)
MCHC: 32.8 g/dL (ref 30.0–36.0)
MCV: 94.7 fL (ref 80.0–100.0)
Platelets: 334 10*3/uL (ref 150–400)
RBC: 2.83 MIL/uL — ABNORMAL LOW (ref 3.87–5.11)
RDW: 16.5 % — ABNORMAL HIGH (ref 11.5–15.5)
WBC: 7.8 10*3/uL (ref 4.0–10.5)
nRBC: 0.3 % — ABNORMAL HIGH (ref 0.0–0.2)

## 2022-11-07 MED ORDER — FERROUS SULFATE 325 (65 FE) MG PO TABS
325.0000 mg | ORAL_TABLET | Freq: Every day | ORAL | Status: DC
  Administered 2022-11-07 – 2022-11-08 (×2): 325 mg via ORAL
  Filled 2022-11-07 (×2): qty 1

## 2022-11-07 NOTE — Progress Notes (Signed)
Progress Note   Patient: Elizabeth Fields ZOX:[id number] DOB: 10/27/1931 DOA: 11/02/2022     4 DOS: the patient was seen and examined on 11/07/2022   Brief hospital course: 89 year old female past medical history of chronic venous stasis dermatitis with ulcers, atrial fibrillation on warfarin, severe tricuspid regurgitation, mild aortic regurgitation and mitral regurgitation.  She was sent in from the wound care center for bleeding lower extremity wounds.  In the ER, they placed a pressure wrap.  INR 6.6 on presentation patient given a dose of vitamin K.  10/26.  Hemoglobin drifted down to 7.6.  (Back in August hemoglobin was 11.0).  Will give a unit of blood.  Continue to hold Coumadin.  Today's INR 3.2.  Will need to look at her wounds tomorrow to determine on what to do with Coumadin. 10/27.  Hemoglobin up to 10.9 after transfusion.  INR 2.5.  With unwrapping the dressing still has some bleeding right lower extremity.  Will continue to hold Coumadin apply pressure dressing and reevaluate tomorrow. 10/28.  Hemoglobin 9.0, creatinine 1.07.  INR 2.2.  Continue to hold Coumadin.  Did not see any active bleeding on the anterior skin tear but did have a little oozing from the large bullae. 10/29.  Hemoglobin 9.4, creatinine 0.95, INR 1.9.  Continue to hold Coumadin.  Patient's nephew very concerned that the patient lives alone and unable not be able to take care of herself.  He is looking into more care at home besides the home health.   Principal Problem:   Acute blood loss anemia Active Problems:   Supratherapeutic INR   Venous stasis dermatitis of both lower extremities   Acute kidney injury superimposed on CKD (HCC)   Hypotension   Atrial fibrillation (HCC)   Hypokalemia   Hyponatremia   Chronic pain syndrome   Hypothyroidism   (HFpEF) heart failure with preserved ejection fraction (HCC)   Metabolic acidosis   Assessment and Plan: * Acute blood loss anemia Supratherapeutic INR Right  leg bleeding. Patient was oozing blood from right leg wound, this is due to supratherapeutic INR, warfarin on hold, she also received a dose of vitamin K, INR came down, bleeding has stopped. Patient received 1 unit of blood and IV iron while here.    Likely will have to go home without Coumadin.  Acute kidney injury superimposed on CKD (HCC) 3b Mild metabolic acidosis. Hypokalemia. Hyponatremia. Renal function improved, better than baseline.  Venous stasis dermatitis of both lower extremities History of chronic venous stasis ulcers of bilateral lower extremities, for which she follows with both dermatology and the wound center.  There is now hemorrhagic bullae right leg and large skin tear right leg.   Continue wound care.  Hypotension Beta-blocker dose reduced,  Chronic atrial fibrillation (HCC) Chronic in nature.  Warfarin on hold secondary to anemia and bleeding and venous stasis ulcers.  Continue metoprolol (with blood pressure being on the lower side will have to dose of metoprolol)  Chronic (HFpEF) heart failure with preserved ejection fraction (HCC) Patient has a history of HFpEF with last EF of 50%, last evaluated in 2021. Euvolemic at this time.  Lasix on hold.  Severe protein calorie malnutrition. BMI 13.76, continue protein supplement.  Hypothyroidism Continue levothyroxine  Chronic pain syndrome On tramadol       Subjective: Patient doing well, no additional bleeding from his leg.  Physical Exam: Vitals:   11/06/22 1954 11/06/22 2014 11/07/22 0437 11/07/22 0822  BP: 129/60 (!) 111/54 (!) 99/45 120/76  Pulse: (!) 113 99 90 (!) 53  Resp:   16 (!) 22  Temp:   98.2 F (36.8 C) 98.2 F (36.8 C)  TempSrc:      SpO2:   100% (!) 60%  Weight:      Height:       General exam: Appears calm and comfortable, severely malnourished. Respiratory system: Clear to auscultation. Respiratory effort normal. Cardiovascular system: Irregular. No JVD, murmurs, rubs,  gallops or clicks. No pedal edema. Gastrointestinal system: Abdomen is nondistended, soft and nontender. No organomegaly or masses felt. Normal bowel sounds heard. Central nervous system: Alert and oriented. No focal neurological deficits. Extremities: Bilateral lower extremity chronic changes, no additional bleeding right leg. Skin: As above Psychiatry: Judgement and insight appear normal. Mood & affect appropriate.    Data Reviewed:  Lab results reviewed.  Family Communication: Nephew updated.  Disposition: Status is: Inpatient Remains inpatient appropriate because: Unsafe discharge, family is looking into additional help at home.  Likely discharge home tomorrow with home care.     Time spent: 35 minutes  Author: Marrion Coy, MD 11/07/2022 11:30 AM  For on call review [email or web address].

## 2022-11-07 NOTE — Progress Notes (Signed)
Physical Therapy Treatment Patient Details Name: Elizabeth Fields MRN: [id number] DOB: 01-31-1931 Today's Date: 11/07/2022   History of Present Illness Elizabeth Fields is a 89 y.o. female with medical history significant of chronic venous stasis dermatitis with ulcers, atrial fibrillation on warfarin, aortic valve insufficiency, CAD, CKD stage IIIb, HFpEF, who presents to the ED due to leg pain.    PT Comments  Pt is progressing with steadiness and safety with mobility using walker for support performing gait, stair negotiation, and functional standing balance at a supervision to ModI level.  There are no barriers from PT standpoint for d/c.  Continued PT will assist pt towards greater LE strengthening, increased functional standing balance, and overall safety with mobility.   If plan is discharge home, recommend the following: A little help with walking and/or transfers;A little help with bathing/dressing/bathroom;Help with stairs or ramp for entrance;Assist for transportation   Can travel by private vehicle        Equipment Recommendations  Rolling walker (2 wheels)    Recommendations for Other Services       Precautions / Restrictions Precautions Precautions: Fall Restrictions Weight Bearing Restrictions: No     Mobility  Bed Mobility Overal bed mobility: Modified Independent                  Transfers Overall transfer level: Needs assistance Equipment used: Rolling walker (2 wheels) Transfers: Sit to/from Stand Sit to Stand: Supervision           General transfer comment: Performed with safe hand placement; steady.    Ambulation/Gait Ambulation/Gait assistance: Supervision Gait Distance (Feet): 200 Feet Assistive device: Rolling walker (2 wheels) Gait Pattern/deviations: Step-through pattern, Decreased step length - right, Decreased step length - left Gait velocity: decreased     General Gait Details: mildly flexed posture; antalgic; decreased stance time R  LE (improved with increased distance ambulating); steady with RW use   Stairs Stairs: Yes Stairs assistance: Supervision Stair Management: Two rails Number of Stairs: 4 General stair comments: Pt with good safety when ascending/descending stairs.   Wheelchair Mobility     Tilt Bed    Modified Rankin (Stroke Patients Only)       Balance Overall balance assessment: Modified Independent Sitting-balance support: No upper extremity supported, Feet unsupported Sitting balance-[name]: Good Sitting balance - Comments: steady to doff and don socks seated in recliner   Standing balance support: Single extremity supported, During functional activity, Reliant on assistive device for balance Standing balance-[name]: Good Standing balance comment: steady ambulating with RW use and functional standing activities for hygiene, ModI with single hand support, supervision with no hand support.                            Cognition Arousal: Alert Behavior During Therapy: WFL for tasks assessed/performed Overall Cognitive Status: Within Functional Limits for tasks assessed                                          Exercises      General Comments        Pertinent Vitals/Pain Pain Assessment Pain Assessment: Faces Faces Pain Scale: Hurts a little bit Pain Location: R lower leg pain Pain Descriptors / Indicators: Aching, Tender Pain Intervention(s): Monitored during session    Home Living  Prior Function            PT Goals (current goals can now be found in the care plan section) Acute Rehab PT Goals Patient Stated Goal: to go back home. PT Goal Formulation: With patient Time For Goal Achievement: 11/17/22 Potential to Achieve Goals: Fair Progress towards PT goals: Progressing toward goals    Frequency    Min 1X/week      PT Plan      Co-evaluation              AM-PAC PT "6 Clicks"  Mobility   Outcome Measure  Help needed turning from your back to your side while in a flat bed without using bedrails?: A Little Help needed moving from lying on your back to sitting on the side of a flat bed without using bedrails?: None Help needed moving to and from a bed to a chair (including a wheelchair)?: A Little Help needed standing up from a chair using your arms (e.g., wheelchair or bedside chair)?: A Little Help needed to walk in hospital room?: A Little Help needed climbing 3-5 steps with a railing? : A Little 6 Click Score: 19    End of Session Equipment Utilized During Treatment: Gait belt Activity Tolerance: Patient tolerated treatment well Patient left: in chair;with call bell/phone within reach;with chair alarm set;with family/visitor present Nurse Communication: Mobility status PT Visit Diagnosis: Unsteadiness on feet (R26.81);Other abnormalities of gait and mobility (R26.89);Muscle weakness (generalized) (M62.81);History of falling (Z91.81);Difficulty in walking, not elsewhere classified (R26.2)     Time: 7829-5621 PT Time Calculation (min) (ACUTE ONLY): 21 min  Charges:    $Therapeutic Activity: 8-22 mins PT General Charges $$ ACUTE PT VISIT: 1 Visit                     Hortencia Conradi, PTA  11/07/22, 9:59 AM

## 2022-11-07 NOTE — Progress Notes (Signed)
Occupational Therapy Treatment Patient Details Name: Elizabeth Fields MRN: [id number] DOB: [birth year] Today's Date: 11/07/2022   History of present illness NAHLAH SIVILS is a 89 y.o. female with medical history significant of chronic venous stasis dermatitis with ulcers, atrial fibrillation on warfarin, aortic valve insufficiency, CAD, CKD stage IIIb, HFpEF, who presents to the ED due to leg pain.   OT comments  Pt is seated in recliner on arrival. Pleasant and agreeable to OT session. She has minimal pain in RLE. Pt performed STS from recliner with SUP and ambulated to the bathroom and back using RW with SBA/SUP. ADL session performed requiring SBA/SUP for bathing and dressing tasks with intermittent standing at sink for balance and seated on BSC for energy conservation and safety. Oral hygiene and grooming tasks performed standing at sink with SUP. Pt returned to recliner with all needs in place and will cont to require skilled acute OT services to maximize her safety and IND to return to PLOF. HHOT remains appropriate on DC.       If plan is discharge home, recommend the following:  A little help with walking and/or transfers;A little help with bathing/dressing/bathroom;Assistance with cooking/housework;Assist for transportation;Help with stairs or ramp for entrance   Equipment Recommendations  None recommended by OT    Recommendations for Other Services      Precautions / Restrictions Precautions Precautions: Fall Restrictions Weight Bearing Restrictions: No       Mobility Bed Mobility               General bed mobility comments: NT in recliner on entry    Transfers Overall transfer level: Needs assistance Equipment used: Rolling walker (2 wheels) Transfers: Sit to/from Stand Sit to Stand: Supervision           General transfer comment: SUP from recliner     Balance Overall balance assessment: Modified Independent             Standing balance comment:  steady with intermittent unilateral support on sink and occasional no UE support for LB bathing with SUP                           ADL either performed or assessed with clinical judgement   ADL Overall ADL's : Needs assistance/impaired     Grooming: Wash/dry hands;Supervision/safety;Standing;Wash/dry face;Oral care;Applying deodorant;Brushing hair   Upper Body Bathing: Supervision/ safety;Standing;Sitting   Lower Body Bathing: Sit to/from stand;Sitting/lateral leans;Contact guard assist   Upper Body Dressing : Set up;Sitting   Lower Body Dressing: Supervision/safety;Sitting/lateral leans;Sit to/from stand Lower Body Dressing Details (indicate cue type and reason): MIN A to don over dressings on R foot, but able to perform on L foot with SUP/SBA             Functional mobility during ADLs: Contact guard assist;Rolling walker (2 wheels) General ADL Comments: CGA/SBA for mobility to bathroom and back    Extremity/Trunk Assessment         Cervical / Trunk Assessment Cervical / Trunk Assessment: Kyphotic    Vision       Perception     Praxis      Cognition Arousal: Alert Behavior During Therapy: WFL for tasks assessed/performed Overall Cognitive Status: Within Functional Limits for tasks assessed  Exercises Other Exercises Other Exercises: Re-educated on sitting on a chair in front of the sink for bathing at home to maximize her safety.    Shoulder Instructions       General Comments      Pertinent Vitals/ Pain       Pain Assessment Pain Assessment: Faces Faces Pain Scale: Hurts a little bit Pain Location: R lower leg pain Pain Descriptors / Indicators: Aching, Tender Pain Intervention(s): Monitored during session  Home Living                                          Prior Functioning/Environment              Frequency  Min 1X/week        Progress Toward  Goals  OT Goals(current goals can now be found in the care plan section)  Progress towards OT goals: Progressing toward goals  Acute Rehab OT Goals Patient Stated Goal: return home OT Goal Formulation: With patient Time For Goal Achievement: 11/20/22 Potential to Achieve Goals: Good  Plan      Co-evaluation                 AM-PAC OT "6 Clicks" Daily Activity     Outcome Measure   Help from another person eating meals?: None Help from another person taking care of personal grooming?: None Help from another person toileting, which includes using toliet, bedpan, or urinal?: A Little Help from another person bathing (including washing, rinsing, drying)?: A Little Help from another person to put on and taking off regular upper body clothing?: None Help from another person to put on and taking off regular lower body clothing?: A Little 6 Click Score: 21    End of Session Equipment Utilized During Treatment: Rolling walker (2 wheels)  OT Visit Diagnosis: Other abnormalities of gait and mobility (R26.89);Muscle weakness (generalized) (M62.81)   Activity Tolerance Patient tolerated treatment well   Patient Left in chair;with call bell/phone within reach;with chair alarm set;with family/visitor present   Nurse Communication Mobility status        Time: 1123-1203 OT Time Calculation (min): 40 min  Charges: OT General Charges $OT Visit: 1 Visit OT Treatments $Self Care/Home Management : 38-52 mins  Leena Tiede, OTR/L  11/07/22, 12:49 PM  Chirag Krueger E Magenta Schmiesing 11/07/2022, 12:46 PM

## 2022-11-07 NOTE — Plan of Care (Signed)

## 2022-11-08 DIAGNOSIS — R791 Abnormal coagulation profile: Secondary | ICD-10-CM | POA: Diagnosis not present

## 2022-11-08 DIAGNOSIS — D62 Acute posthemorrhagic anemia: Secondary | ICD-10-CM | POA: Diagnosis not present

## 2022-11-08 DIAGNOSIS — I872 Venous insufficiency (chronic) (peripheral): Secondary | ICD-10-CM | POA: Diagnosis not present

## 2022-11-08 LAB — VITAMIN B12: Vitamin B-12: 593 pg/mL (ref 180–914)

## 2022-11-08 LAB — HEMOGLOBIN: Hemoglobin: 8.6 g/dL — ABNORMAL LOW (ref 12.0–15.0)

## 2022-11-08 MED ORDER — FE FUMARATE-B12-VIT C-FA-IFC PO CAPS: 1.0000 | ORAL_CAPSULE | Freq: Two times a day (BID) | ORAL | 0 refills | Status: DC

## 2022-11-08 MED ORDER — METOPROLOL SUCCINATE ER 50 MG PO TB24: 50.0000 mg | ORAL_TABLET | Freq: Every day | ORAL | Status: DC

## 2022-11-08 NOTE — Discharge Summary (Signed)
Physician Discharge Summary   Patient: Elizabeth Fields MRN: [id number] DOB: [birth year]  Admit date:     11/02/2022  Discharge date: 11/08/22  Discharge Physician: Marrion Coy   PCP: Gracelyn Nurse, MD   Recommendations at discharge:   Follow-up with PCP in 1 week. Check a CBC at her next office visit. Follow-up B12 level which has been sent out.  Discharge Diagnoses: Principal Problem:   Acute blood loss anemia Active Problems:   Supratherapeutic INR   Venous stasis dermatitis of both lower extremities   Acute kidney injury superimposed on CKD (HCC)   Hypotension   Atrial fibrillation (HCC)   Hypokalemia   Hyponatremia   Chronic pain syndrome   Hypothyroidism   (HFpEF) heart failure with preserved ejection fraction (HCC)   Metabolic acidosis  Resolved Problems:   Skin bulla  Hospital Course: 89 year old female past medical history of chronic venous stasis dermatitis with ulcers, atrial fibrillation on warfarin, severe tricuspid regurgitation, mild aortic regurgitation and mitral regurgitation.  She was sent in from the wound care center for bleeding lower extremity wounds.  In the ER, they placed a pressure wrap.  INR 6.6 on presentation patient given a dose of vitamin K.  10/26.  Hemoglobin drifted down to 7.6.  (Back in August hemoglobin was 11.0).  Will give a unit of blood.  Continue to hold Coumadin.  Today's INR 3.2.  Will need to look at her wounds tomorrow to determine on what to do with Coumadin. 10/27.  Hemoglobin up to 10.9 after transfusion.  INR 2.5.  With unwrapping the dressing still has some bleeding right lower extremity.  Will continue to hold Coumadin apply pressure dressing and reevaluate tomorrow. 10/28.  Hemoglobin 9.0, creatinine 1.07.  INR 2.2.  Continue to hold Coumadin.  Did not see any active bleeding on the anterior skin tear but did have a little oozing from the large bullae. 10/29.  Hemoglobin 9.4, creatinine 0.95, INR 1.9.  Continue to hold  Coumadin.   Met with patient and nephew today, they will be able to take patient home today with home care.  Hemoglobin 8.6 today, relatively stable.  Bleeding from right leg is getting better.  But still has small amount of bleeding with dressing changes.  Will continue follow-up with RN after discharge.  Coumadin will be discontinued.  Assessment and Plan: * Acute blood loss anemia Supratherapeutic INR Right leg bleeding. Patient was oozing blood from right leg wound, this is due to supratherapeutic INR, warfarin on hold, she also received a dose of vitamin K, INR came down, bleeding has stopped. Patient received 1 unit of blood and IV iron while here.     Patient was also taking oral iron in combination with B12, will resume that. Patient only have small amount of bleeding with dressing changes.  Hemoglobin relatively stable.  Medically stable for discharge.   Acute kidney injury superimposed on CKD (HCC) 3b Mild metabolic acidosis. Hypokalemia. Hyponatremia. Renal function improved, better than baseline.   Venous stasis dermatitis of both lower extremities History of chronic venous stasis ulcers of bilateral lower extremities, for which she follows with both dermatology and the wound center.  There is now hemorrhagic bullae right leg and large skin tear right leg.   Patient will be followed by the visiting nurse after discharge.   Hypotension Beta-blocker dose reduced,   Chronic atrial fibrillation (HCC) Chronic in nature.  Warfarin on hold secondary to anemia and bleeding and venous stasis ulcers.  Continue metoprolol dose  to 50 mg daily.  Chronic (HFpEF) heart failure with preserved ejection fraction (HCC) Patient has a history of HFpEF with last EF of 50%, last evaluated in 2021. Euvolemic at this time.  Resume home treatment.   Severe protein calorie malnutrition. BMI 13.76, continue protein supplement.   Hypothyroidism Continue levothyroxine   Chronic pain syndrome On  tramadol       Consultants: None Procedures performed: None  Disposition: Home health Diet recommendation:  Discharge Diet Orders (From admission, onward)     Start     Ordered   11/08/22 0000  Diet - low sodium heart healthy        11/08/22 1044           Cardiac diet DISCHARGE MEDICATION: Allergies as of 11/08/2022       Reactions   Fosamax [alendronate Sodium] Nausea And Vomiting   Other Rash   Penicillins Nausea And Vomiting, Palpitations        Medication List     STOP taking these medications    aspirin EC 81 MG tablet   doxepin 10 MG capsule Commonly known as: SINEQUAN   warfarin 4 MG tablet Commonly known as: COUMADIN       TAKE these medications    acetaminophen 500 MG tablet Commonly known as: TYLENOL Take 1-2 tablets (500-1,000 mg total) by mouth every 8 (eight) hours as needed for mild pain or moderate pain.   Cholecalciferol 50 MCG (2000 UT) Caps Take 2,000 Units by mouth daily.   esomeprazole 40 MG capsule Commonly known as: NEXIUM Take 40 mg by mouth daily at 12 noon.   feeding supplement Liqd Take 237 mLs by mouth 2 (two) times daily between meals.   ferrous fumarate-b12-vitamic C-folic acid capsule Commonly known as: TRINSICON / FOLTRIN Take 1 capsule by mouth 2 (two) times daily.   furosemide 20 MG tablet Commonly known as: LASIX Take 1 tablet (20 mg total) by mouth daily.   levothyroxine 100 MCG tablet Commonly known as: SYNTHROID Take 100 mcg by mouth daily before breakfast.   magnesium oxide 400 MG tablet Commonly known as: MAG-OX Take 400 mg by mouth daily.   methocarbamol 500 MG tablet Commonly known as: ROBAXIN Take 500 mg by mouth every 6 (six) hours as needed for muscle spasms.   metoprolol succinate 50 MG 24 hr tablet Commonly known as: TOPROL-XL Take 100 mg by mouth daily. Take with or immediately following a meal.   simvastatin 20 MG tablet Commonly known as: ZOCOR Take 20 mg by mouth daily.    spironolactone 25 MG tablet Commonly known as: ALDACTONE Take 25 mg by mouth daily.   traMADol 50 MG tablet Commonly known as: ULTRAM Take by mouth.   triamcinolone ointment 0.1 % Commonly known as: KENALOG Apply 1 Application topically 2 (two) times daily as needed (Rash). To affected areas at legs. Avoid applying to face, groin, and axilla. Use as directed. Long-term use can cause thinning of the skin.               Discharge Care Instructions  (From admission, onward)           Start     Ordered   11/08/22 0000  Discharge wound care:       Comments: CLeanse bilateral lower legs with soap and water and pat gently dry.  TO nonitnact skin, apply silicone nonadherent contact layer (Mepitel)  (LAWSON [phone]) - NOT silicone foam dressing to wound bed.  THis will aid in atraumatic  dressing removal.  If Mepitel not available, use double layer of vaseline gauze.   Wrap legs with kerlix and tape. IF bleeding, may secure with Coban to right leg for modified compression.  Change on MOnday and THursday.   11/08/22 1044            Follow-up Information     Gracelyn Nurse, MD Follow up in 1 week(s).   Specialty: Internal Medicine Contact information: [address] [facility] [place] [address] [phone]                Discharge Exam: Filed Weights   11/02/22 1009 11/02/22 1014 11/02/22 2055  Weight: 38.1 kg 38.1 kg 30.9 kg   General exam: Appears calm and comfortable, severely malnourished. Respiratory system: Clear to auscultation. Respiratory effort normal. Cardiovascular system: Irregular. No JVD, murmurs, rubs, gallops or clicks. No pedal edema. Gastrointestinal system: Abdomen is nondistended, soft and nontender. No organomegaly or masses felt. Normal bowel sounds heard. Central nervous system: Alert and oriented. No focal neurological deficits. Extremities: Right leg bleeding has stopped. Skin: Bilateral lower extremity venous  stasis. Psychiatry: Judgement and insight appear normal. Mood & affect appropriate.    Condition at discharge: good  The results of significant diagnostics from this hospitalization (including imaging, microbiology, ancillary and laboratory) are listed below for reference.   Imaging Studies: DG Chest 2 View  Result Date: 11/02/2022 CLINICAL DATA:  Suspected Sepsis.  Right leg pain. EXAM: CHEST - 2 VIEW COMPARISON:  02/14/2019. FINDINGS: Bilateral lung fields are clear. Bilateral costophrenic angles are clear. Normal cardio-mediastinal silhouette. No acute osseous abnormalities. Moderate anterior wedging deformity of upper thoracic vertebrae and mild-to-moderate anterior wedging deformity of lower thoracic vertebrae are unchanged since the prior studies from 02/03/2020. The soft tissues are within normal limits. IMPRESSION: *No acute cardiopulmonary abnormality. *Stable compression deformities of thoracic vertebra, unchanged since the prior study from 02/03/2020. Electronically Signed   By: Jules Schick M.D.   On: 11/02/2022 11:02    Microbiology: Results for orders placed or performed during the hospital encounter of 11/02/22  Culture, blood (Routine x 2)     Status: None   Collection Time: 11/02/22 10:31 AM   Specimen: BLOOD LEFT ARM  Result Value Ref Range Status   Specimen Description BLOOD LEFT ARM  Final   Special Requests   Final    BOTTLES DRAWN AEROBIC AND ANAEROBIC Blood Culture adequate volume   Culture   Final    NO GROWTH 5 DAYS Performed at [facility], [address] [place], [place] [address]    Report Status 11/07/2022 FINAL  Final    Labs: CBC: Recent Labs  Lab 11/02/22 1031 11/03/22 0450 11/04/22 0504 11/05/22 0734 11/06/22 0324 11/07/22 0635 11/08/22 0426  WBC 6.5 7.9 8.7  --   --  7.8  --   NEUTROABS 4.8  --   --   --   --   --   --   HGB 9.1* 7.6* 10.9* 9.0* 9.4* 8.8* 8.6*  HCT 28.0* 23.1* 32.9*  --   --  26.8*  --   MCV 95.2 94.7 92.9   --   --  94.7  --   PLT 376 338 335  --   --  334  --    Basic Metabolic Panel: Recent Labs  Lab 11/03/22 0450 11/04/22 0504 11/05/22 0734 11/06/22 0324 11/07/22 0635  NA 138 136 137 133* 136  K 4.0 4.8 3.8 3.3* 4.0  CL 98 99  104 101 106  CO2 28 28 25 25  21*  GLUCOSE 112* 121* 103* 92 101*  BUN 47* 48* 30* 22 21  CREATININE 1.43* 1.69* 1.07* 0.95 0.92  CALCIUM 8.8* 8.8* 7.9* 7.7* 7.9*   Liver Function Tests: Recent Labs  Lab 11/02/22 1031  AST 30  ALT 17  ALKPHOS 91  BILITOT 1.0  PROT 7.4  ALBUMIN 3.5   CBG: No results for input(s): "GLUCAP" in the last 168 hours.  Discharge time spent: greater than 30 minutes.  Signed: Marrion Coy, MD [facility] 11/08/2022

## 2022-11-08 NOTE — Plan of Care (Signed)
  Problem: Education: Goal: Knowledge of General Education information will improve Description: Including pain rating scale, medication(s)/side effects and non-pharmacologic comfort measures Outcome: Progressing   Problem: Health Behavior/Discharge Planning: Goal: Ability to manage health-related needs will improve Outcome: Progressing   Problem: Clinical Measurements: Goal: Ability to maintain clinical measurements within normal limits will improve Outcome: Progressing   Problem: Activity: Goal: Risk for activity intolerance will decrease Outcome: Progressing   Problem: Nutrition: Goal: Adequate nutrition will be maintained Outcome: Progressing   Problem: Coping: Goal: Level of anxiety will decrease Outcome: Progressing   Problem: Elimination: Goal: Will not experience complications related to bowel motility Outcome: Progressing   Problem: Pain Management: Goal: General experience of comfort will improve Outcome: Progressing   Problem: Safety: Goal: Ability to remain free from injury will improve Outcome: Progressing   Problem: Skin Integrity: Goal: Risk for impaired skin integrity will decrease Outcome: Progressing

## 2022-11-11 ENCOUNTER — Observation Stay
Admission: EM | Admit: 2022-11-11 | Discharge: 2022-11-12 | Disposition: A | Discharge status: Home / Self Care | Payer: PPO | Attending: Family Medicine | Admitting: Family Medicine

## 2022-11-11 ENCOUNTER — Other Ambulatory Visit: Payer: Self-pay

## 2022-11-11 ENCOUNTER — Emergency Department: Payer: PPO

## 2022-11-11 ENCOUNTER — Observation Stay: Payer: PPO

## 2022-11-11 DIAGNOSIS — I5032 Chronic diastolic (congestive) heart failure: Secondary | ICD-10-CM | POA: Insufficient documentation

## 2022-11-11 DIAGNOSIS — Z1389 Encounter for screening for other disorder: Secondary | ICD-10-CM | POA: Diagnosis not present

## 2022-11-11 DIAGNOSIS — I6782 Cerebral ischemia: Secondary | ICD-10-CM | POA: Diagnosis not present

## 2022-11-11 DIAGNOSIS — I10 Essential (primary) hypertension: Secondary | ICD-10-CM | POA: Insufficient documentation

## 2022-11-11 DIAGNOSIS — N1832 Chronic kidney disease, stage 3b: Secondary | ICD-10-CM | POA: Diagnosis not present

## 2022-11-11 DIAGNOSIS — R55 Syncope and collapse: Secondary | ICD-10-CM | POA: Diagnosis not present

## 2022-11-11 DIAGNOSIS — I4891 Unspecified atrial fibrillation: Secondary | ICD-10-CM

## 2022-11-11 DIAGNOSIS — E039 Hypothyroidism, unspecified: Secondary | ICD-10-CM | POA: Diagnosis present

## 2022-11-11 DIAGNOSIS — Z96642 Presence of left artificial hip joint: Secondary | ICD-10-CM | POA: Diagnosis not present

## 2022-11-11 DIAGNOSIS — E785 Hyperlipidemia, unspecified: Secondary | ICD-10-CM | POA: Insufficient documentation

## 2022-11-11 DIAGNOSIS — I63512 Cerebral infarction due to unspecified occlusion or stenosis of left middle cerebral artery: Principal | ICD-10-CM | POA: Insufficient documentation

## 2022-11-11 DIAGNOSIS — I6522 Occlusion and stenosis of left carotid artery: Secondary | ICD-10-CM | POA: Insufficient documentation

## 2022-11-11 DIAGNOSIS — I482 Chronic atrial fibrillation, unspecified: Secondary | ICD-10-CM | POA: Insufficient documentation

## 2022-11-11 DIAGNOSIS — Z85828 Personal history of other malignant neoplasm of skin: Secondary | ICD-10-CM | POA: Diagnosis not present

## 2022-11-11 DIAGNOSIS — I700 Atherosclerosis of aorta: Secondary | ICD-10-CM | POA: Insufficient documentation

## 2022-11-11 DIAGNOSIS — R4182 Altered mental status, unspecified: Secondary | ICD-10-CM | POA: Diagnosis not present

## 2022-11-11 DIAGNOSIS — G9340 Encephalopathy, unspecified: Secondary | ICD-10-CM | POA: Diagnosis not present

## 2022-11-11 DIAGNOSIS — Z7901 Long term (current) use of anticoagulants: Secondary | ICD-10-CM | POA: Insufficient documentation

## 2022-11-11 DIAGNOSIS — I6381 Other cerebral infarction due to occlusion or stenosis of small artery: Secondary | ICD-10-CM | POA: Diagnosis not present

## 2022-11-11 DIAGNOSIS — I130 Hypertensive heart and chronic kidney disease with heart failure and stage 1 through stage 4 chronic kidney disease, or unspecified chronic kidney disease: Secondary | ICD-10-CM | POA: Diagnosis not present

## 2022-11-11 DIAGNOSIS — Z79899 Other long term (current) drug therapy: Secondary | ICD-10-CM | POA: Diagnosis not present

## 2022-11-11 DIAGNOSIS — I517 Cardiomegaly: Secondary | ICD-10-CM | POA: Diagnosis not present

## 2022-11-11 DIAGNOSIS — I639 Cerebral infarction, unspecified: Secondary | ICD-10-CM | POA: Diagnosis not present

## 2022-11-11 DIAGNOSIS — G319 Degenerative disease of nervous system, unspecified: Secondary | ICD-10-CM | POA: Diagnosis not present

## 2022-11-11 DIAGNOSIS — R410 Disorientation, unspecified: Secondary | ICD-10-CM | POA: Diagnosis present

## 2022-11-11 DIAGNOSIS — I2510 Atherosclerotic heart disease of native coronary artery without angina pectoris: Secondary | ICD-10-CM | POA: Insufficient documentation

## 2022-11-11 DIAGNOSIS — I959 Hypotension, unspecified: Secondary | ICD-10-CM | POA: Diagnosis not present

## 2022-11-11 LAB — COMPREHENSIVE METABOLIC PANEL
ALT: 35 U/L (ref 0–44)
AST: 67 U/L — ABNORMAL HIGH (ref 15–41)
Albumin: 2.9 g/dL — ABNORMAL LOW (ref 3.5–5.0)
Alkaline Phosphatase: 115 U/L (ref 38–126)
Anion gap: 9 (ref 5–15)
BUN: 25 mg/dL — ABNORMAL HIGH (ref 8–23)
CO2: 20 mmol/L — ABNORMAL LOW (ref 22–32)
Calcium: 8.1 mg/dL — ABNORMAL LOW (ref 8.9–10.3)
Chloride: 104 mmol/L (ref 98–111)
Creatinine, Ser: 1.11 mg/dL — ABNORMAL HIGH (ref 0.44–1.00)
GFR, Estimated: 48 mL/min — ABNORMAL LOW (ref >60)
Glucose, Bld: 92 mg/dL (ref 70–99)
Potassium: 4.6 mmol/L (ref 3.5–5.1)
Sodium: 133 mmol/L — ABNORMAL LOW (ref 135–145)
Total Bilirubin: 1 mg/dL (ref 0.3–1.2)
Total Protein: 6.6 g/dL (ref 6.5–8.1)

## 2022-11-11 LAB — URINALYSIS, W/ REFLEX TO CULTURE (INFECTION SUSPECTED)
Bacteria, UA: NONE SEEN
Bilirubin Urine: NEGATIVE
Glucose, UA: NEGATIVE mg/dL
Hgb urine dipstick: NEGATIVE
Ketones, ur: NEGATIVE mg/dL
Leukocytes,Ua: NEGATIVE
Nitrite: NEGATIVE
Protein, ur: NEGATIVE mg/dL
Specific Gravity, Urine: 1.012 (ref 1.005–1.030)
pH: 5 (ref 5.0–8.0)

## 2022-11-11 LAB — CBC WITH DIFFERENTIAL/PLATELET
Abs Immature Granulocytes: 0.07 10*3/uL (ref 0.00–0.07)
Basophils Absolute: 0.1 10*3/uL (ref 0.0–0.1)
Basophils Relative: 1 %
Eosinophils Absolute: 0.1 10*3/uL (ref 0.0–0.5)
Eosinophils Relative: 1 %
HCT: 31.7 % — ABNORMAL LOW (ref 36.0–46.0)
Hemoglobin: 9.7 g/dL — ABNORMAL LOW (ref 12.0–15.0)
Immature Granulocytes: 1 %
Lymphocytes Relative: 12 %
Lymphs Abs: 0.9 10*3/uL (ref 0.7–4.0)
MCH: 31.5 pg (ref 26.0–34.0)
MCHC: 30.6 g/dL (ref 30.0–36.0)
MCV: 102.9 fL — ABNORMAL HIGH (ref 80.0–100.0)
Monocytes Absolute: 0.9 10*3/uL (ref 0.1–1.0)
Monocytes Relative: 12 %
Neutro Abs: 5.5 10*3/uL (ref 1.7–7.7)
Neutrophils Relative %: 73 %
Platelets: 368 10*3/uL (ref 150–400)
RBC: 3.08 MIL/uL — ABNORMAL LOW (ref 3.87–5.11)
RDW: 17.3 % — ABNORMAL HIGH (ref 11.5–15.5)
WBC: 7.5 10*3/uL (ref 4.0–10.5)
nRBC: 0 % (ref 0.0–0.2)

## 2022-11-11 LAB — APTT: aPTT: 24 s (ref 24–36)

## 2022-11-11 LAB — PROTIME-INR
INR: 1.2 (ref 0.8–1.2)
Prothrombin Time: 15.3 s — ABNORMAL HIGH (ref 11.4–15.2)

## 2022-11-11 LAB — LACTIC ACID, PLASMA: Lactic Acid, Venous: 1.8 mmol/L (ref 0.5–1.9)

## 2022-11-11 MED ORDER — PANTOPRAZOLE SODIUM 40 MG PO TBEC
40.0000 mg | DELAYED_RELEASE_TABLET | Freq: Every day | ORAL | Status: DC
Start: 2022-11-12 — End: 2022-11-12

## 2022-11-11 MED ORDER — ONDANSETRON HCL 4 MG PO TABS: 4.0000 mg | ORAL_TABLET | Freq: Four times a day (QID) | ORAL | Status: DC | PRN

## 2022-11-11 MED ORDER — VITAMIN D3 25 MCG (1000 UNIT) PO TABS
2000.0000 [IU] | ORAL_TABLET | Freq: Every day | ORAL | Status: DC
  Filled 2022-11-11: qty 2

## 2022-11-11 MED ORDER — MAGNESIUM OXIDE 400 MG PO TABS
400.0000 mg | ORAL_TABLET | Freq: Every day | ORAL | Status: DC
  Filled 2022-11-11: qty 1

## 2022-11-11 MED ORDER — ACETAMINOPHEN 650 MG RE SUPP: 650.0000 mg | Freq: Four times a day (QID) | RECTAL | Status: DC | PRN

## 2022-11-11 MED ORDER — DILTIAZEM HCL-DEXTROSE 125-5 MG/125ML-% IV SOLN (PREMIX)
5.0000 mg/h | INTRAVENOUS | Status: DC
  Administered 2022-11-11: 5 mg/h via INTRAVENOUS
  Filled 2022-11-11: qty 125

## 2022-11-11 MED ORDER — SODIUM CHLORIDE 0.9 % IV BOLUS (SEPSIS)
1000.0000 mL | Freq: Once | INTRAVENOUS | Status: AC
  Administered 2022-11-11: 1000 mL via INTRAVENOUS

## 2022-11-11 MED ORDER — LEVOTHYROXINE SODIUM 100 MCG PO TABS: 100.0000 ug | ORAL_TABLET | Freq: Every day | ORAL | Status: DC

## 2022-11-11 MED ORDER — SPIRONOLACTONE 25 MG PO TABS: 25.0000 mg | ORAL_TABLET | Freq: Every day | ORAL | Status: DC

## 2022-11-11 MED ORDER — TRAZODONE HCL 50 MG PO TABS: 25.0000 mg | ORAL_TABLET | Freq: Every evening | ORAL | Status: DC | PRN

## 2022-11-11 MED ORDER — ENOXAPARIN SODIUM 30 MG/0.3ML IJ SOSY: 30.0000 mg | PREFILLED_SYRINGE | INTRAMUSCULAR | Status: DC

## 2022-11-11 MED ORDER — SODIUM CHLORIDE 0.9 % IV SOLN
INTRAVENOUS | Status: DC
Start: 2022-11-11 — End: 2022-11-12

## 2022-11-11 MED ORDER — TRIAMCINOLONE ACETONIDE 0.1 % EX OINT: 1.0000 | TOPICAL_OINTMENT | Freq: Two times a day (BID) | CUTANEOUS | Status: DC | PRN

## 2022-11-11 MED ORDER — ACETAMINOPHEN 325 MG PO TABS: 650.0000 mg | ORAL_TABLET | Freq: Four times a day (QID) | ORAL | Status: DC | PRN

## 2022-11-11 MED ORDER — ONDANSETRON HCL 4 MG/2ML IJ SOLN: 4.0000 mg | Freq: Four times a day (QID) | INTRAMUSCULAR | Status: DC | PRN

## 2022-11-11 MED ORDER — ENSURE ENLIVE PO LIQD: 237.0000 mL | Freq: Two times a day (BID) | ORAL | Status: DC

## 2022-11-11 MED ORDER — MAGNESIUM HYDROXIDE 400 MG/5ML PO SUSP: 30.0000 mL | Freq: Every day | ORAL | Status: DC | PRN

## 2022-11-11 MED ORDER — FE FUM-VIT C-VIT B12-FA 460-60-0.01-1 MG PO CAPS
1.0000 | ORAL_CAPSULE | Freq: Every day | ORAL | Status: DC
  Filled 2022-11-11: qty 1

## 2022-11-11 MED ORDER — METOPROLOL SUCCINATE ER 50 MG PO TB24: 50.0000 mg | ORAL_TABLET | Freq: Every day | ORAL | Status: DC

## 2022-11-11 MED ORDER — SIMVASTATIN 20 MG PO TABS: 20.0000 mg | ORAL_TABLET | Freq: Every day | ORAL | Status: DC

## 2022-11-11 NOTE — ED Provider Notes (Signed)
[facility] Provider Note    Event Date/Time   First MD Initiated Contact with Patient 11/11/22 1844     (approximate)   History   Chief Complaint: Wound Infection   HPI  Elizabeth Fields is a 89 y.o. female with history of atrial fibrillation hypertension CHF and chronic venous stasis ulcers on lower extremities who is brought to the ED due to confusion and somnolence that started this afternoon, noticed by nephew on the phone at about 5:00 PM.  He has a neighbor to check on the patient, found her to be altered and 911 was called to bring the patient to the ED.  Patient is not interactive.  Not able to participate in exam or history.  Nephew at bedside denies any history of trauma.  Nephew does note that patient lives alone, insists on managing her medications herself although he doubts that she is able to keep her regimen organized.    11/08/22 hospital admission discharge summary: * Acute blood loss anemia Supratherapeutic INR Right leg bleeding. Patient was oozing blood from right leg wound, this is due to supratherapeutic INR, warfarin on hold, she also received a dose of vitamin K, INR came down, bleeding has stopped. Patient received 1 unit of blood and IV iron while here.     Patient was also taking oral iron in combination with B12, will resume that. Patient only have small amount of bleeding with dressing changes.  Hemoglobin relatively stable.  Medically stable for discharge.   Acute kidney injury superimposed on CKD (HCC) 3b Mild metabolic acidosis. Hypokalemia. Hyponatremia. Renal function improved, better than baseline.   Venous stasis dermatitis of both lower extremities History of chronic venous stasis ulcers of bilateral lower extremities, for which she follows with both dermatology and the wound center.  There is now hemorrhagic bullae right leg and large skin tear right leg.   Patient will be followed by the visiting nurse after  discharge.   Hypotension Beta-blocker dose reduced,   Chronic atrial fibrillation (HCC) Chronic in nature.  Warfarin on hold secondary to anemia and bleeding and venous stasis ulcers.  Continue metoprolol dose to 50 mg daily.   Chronic (HFpEF) heart failure with preserved ejection fraction (HCC) Patient has a history of HFpEF with last EF of 50%, last evaluated in 2021. Euvolemic at this time.  Resume home treatment.   Severe protein calorie malnutrition. BMI 13.76, continue protein supplement.   Hypothyroidism Continue levothyroxine   Chronic pain syndrome On tramadol      Physical Exam   Triage Vital Signs: ED Triage Vitals [11/11/22 1827]  Encounter Vitals Group     BP      Systolic BP Percentile      Diastolic BP Percentile      Pulse      Resp      Temp      Temp src      SpO2      Weight 83 lb 12.4 oz (38 kg)     Height 4\' 11"  (1.499 m)     Head Circumference      Peak Flow      Pain Score      Pain Loc      Pain Education      Exclude from Growth Chart     Most recent vital signs: Vitals:   11/11/22 2130 11/11/22 2200  BP: 115/85 (!) 123/48  Pulse: 100 (!) 113  Resp: (!) 21 17  Temp:  SpO2: 100% 99%    General: Stuporous CV:  Good peripheral perfusion.  Irregular rhythm, tachycardia heart rate 120 Resp:  Normal effort.  Clear to auscultation bilaterally Abd:  No distention.  Soft nontender Other:  Cranial nerves III through XII intact.  Extraocular movements appear intact.  Dry oral mucosa.  No facial asymmetry.  Moving all extremities equally.  Responds to noxious stimulus symmetrically in all extremities.   ED Results / Procedures / Treatments   Labs (all labs ordered are listed, but only abnormal results are displayed) Labs Reviewed  PROTIME-INR - Abnormal; Notable for the following components:      Result Value   Prothrombin Time 15.3 (*)    All other components within normal limits  URINALYSIS, W/ REFLEX TO CULTURE (INFECTION  SUSPECTED) - Abnormal; Notable for the following components:   Color, Urine YELLOW (*)    APPearance CLEAR (*)    All other components within normal limits  CBC WITH DIFFERENTIAL/PLATELET - Abnormal; Notable for the following components:   RBC 3.08 (*)    Hemoglobin 9.7 (*)    HCT 31.7 (*)    MCV 102.9 (*)    RDW 17.3 (*)    All other components within normal limits  COMPREHENSIVE METABOLIC PANEL - Abnormal; Notable for the following components:   Sodium 133 (*)    CO2 20 (*)    BUN 25 (*)    Creatinine, Ser 1.11 (*)    Calcium 8.1 (*)    Albumin 2.9 (*)    AST 67 (*)    GFR, Estimated 48 (*)    All other components within normal limits  CULTURE, BLOOD (ROUTINE X 2)  CULTURE, BLOOD (ROUTINE X 2)  LACTIC ACID, PLASMA  APTT  CBC WITH DIFFERENTIAL/PLATELET     EKG Interpreted by me Atrial fibrillation rate of 121.  Left axis, prolonged QTc.  Poor R wave progression.  Normal ST segments and T waves.  1 PVC on the strip.   RADIOLOGY Chest x-ray interpreted by me, unremarkable.  Radiology report reviewed.  CT head unremarkable.   PROCEDURES:  Procedures   MEDICATIONS ORDERED IN ED: Medications  diltiazem (CARDIZEM) 125 mg in dextrose 5% 125 mL (1 mg/mL) infusion (has no administration in time range)  sodium chloride 0.9 % bolus 1,000 mL (0 mLs Intravenous Stopped 11/11/22 2059)     IMPRESSION / MDM / ASSESSMENT AND PLAN / ED COURSE  I reviewed the triage vital signs and the nursing notes.  DDx: Dehydration, AKI, electrolyte abnormality, anemia, polypharmacy, UTI, sepsis, intracranial hemorrhage.  Low suspicion for stroke  Patient's presentation is most consistent with acute presentation with potential threat to life or bodily function.  Patient presents with altered mental status.  No evidence of trauma.  Will check labs, give IV fluids for hydration.  ----------------------------------------- 11:06 PM on  11/11/2022 ----------------------------------------- Workup reassuring.  Labs, chest x-ray, CT head all unremarkable.  Sepsis is ruled out.  She does have persistent atrial fibrillation with RVR, encephalopathy possibly due to polypharmacy or medication mismanagement.  Case discussed with hospitalist for further management.       FINAL CLINICAL IMPRESSION(S) / ED DIAGNOSES   Final diagnoses:  Encephalopathy acute  Atrial fibrillation with RVR (HCC)     Rx / DC Orders   ED Discharge Orders     None        Note:  This document was prepared using Dragon voice recognition software and may include unintentional dictation errors.   Sharman Cheek,  MD 11/11/22 2306

## 2022-11-11 NOTE — ED Notes (Signed)
[facility] labs were sent on pt--lab called and asked if they could run the labs

## 2022-11-11 NOTE — ED Triage Notes (Signed)
Patient comes in via [facility] from home due to a possible wound infection. According to EMS the patient lives home alone, and was recently discharged from the hospital on 11/08/2022. The patient has 2 open wounds on her right leg with obvious necrosis. According to family, she was normal this morning on the phone. He spoke with her on the phone around 5pm then he noticed that her breathing changed and she went unresponsive.  He had a neighbor go over an d check on her, and found her unresponsive. Pt is responsive to only painful stimuli at this time, vital signs are in normal limits.

## 2022-11-11 NOTE — Progress Notes (Signed)
PHARMACIST - PHYSICIAN COMMUNICATION  CONCERNING:  Enoxaparin (Lovenox) for DVT Prophylaxis    RECOMMENDATION: Patient was prescribed enoxaprin 40mg  q24 hours for VTE prophylaxis.   Filed Weights   11/11/22 1827  Weight: 38 kg (83 lb 12.4 oz)    Body mass index is 16.92 kg/m.  Estimated Creatinine Clearance: 20.6 mL/min (A) (by C-G formula based on SCr of 1.11 mg/dL (H)).  Patient is candidate for enoxaparin 30mg  every 24 hours based on CrCl <5ml/min or Weight <45kg  DESCRIPTION: Pharmacy has adjusted enoxaparin dose per [facility] policy.  Patient is now receiving enoxaparin 30 mg every 24 hours   Otelia Sergeant, PharmD, [facility] 11/11/2022 11:43 PM

## 2022-11-12 ENCOUNTER — Inpatient Hospital Stay (HOSPITAL_COMMUNITY): Payer: PPO | Admitting: Certified Registered Nurse Anesthetist

## 2022-11-12 ENCOUNTER — Encounter (HOSPITAL_COMMUNITY): Payer: Self-pay | Admitting: Certified Registered Nurse Anesthetist

## 2022-11-12 ENCOUNTER — Observation Stay: Payer: PPO

## 2022-11-12 ENCOUNTER — Encounter (HOSPITAL_COMMUNITY): Payer: Self-pay

## 2022-11-12 ENCOUNTER — Inpatient Hospital Stay (HOSPITAL_BASED_OUTPATIENT_CLINIC_OR_DEPARTMENT_OTHER): Admit: 2022-11-12 | Payer: PPO

## 2022-11-12 ENCOUNTER — Inpatient Hospital Stay (HOSPITAL_COMMUNITY): Payer: PPO

## 2022-11-12 ENCOUNTER — Encounter (HOSPITAL_COMMUNITY)
Admission: EM | Disposition: A | Discharge status: Hospice | Payer: Self-pay | Source: Other Acute Inpatient Hospital | Attending: Neurology

## 2022-11-12 ENCOUNTER — Other Ambulatory Visit (HOSPITAL_COMMUNITY): Payer: Self-pay | Admitting: Interventional Radiology

## 2022-11-12 ENCOUNTER — Ambulatory Visit: Payer: PPO | Admitting: Physician Assistant

## 2022-11-12 ENCOUNTER — Inpatient Hospital Stay (HOSPITAL_COMMUNITY)
Admission: EM | Admit: 2022-11-12 | Discharge: 2022-11-13 | DRG: 023 | Disposition: A | Discharge status: Hospice | Payer: PPO | Source: Other Acute Inpatient Hospital | Attending: Neurology | Admitting: Neurology

## 2022-11-12 DIAGNOSIS — N1832 Chronic kidney disease, stage 3b: Secondary | ICD-10-CM | POA: Diagnosis present

## 2022-11-12 DIAGNOSIS — I700 Atherosclerosis of aorta: Secondary | ICD-10-CM | POA: Diagnosis not present

## 2022-11-12 DIAGNOSIS — I110 Hypertensive heart disease with heart failure: Secondary | ICD-10-CM | POA: Diagnosis not present

## 2022-11-12 DIAGNOSIS — E785 Hyperlipidemia, unspecified: Secondary | ICD-10-CM | POA: Insufficient documentation

## 2022-11-12 DIAGNOSIS — I272 Pulmonary hypertension, unspecified: Secondary | ICD-10-CM | POA: Diagnosis present

## 2022-11-12 DIAGNOSIS — J449 Chronic obstructive pulmonary disease, unspecified: Secondary | ICD-10-CM | POA: Diagnosis present

## 2022-11-12 DIAGNOSIS — I130 Hypertensive heart and chronic kidney disease with heart failure and stage 1 through stage 4 chronic kidney disease, or unspecified chronic kidney disease: Secondary | ICD-10-CM | POA: Diagnosis not present

## 2022-11-12 DIAGNOSIS — G8191 Hemiplegia, unspecified affecting right dominant side: Secondary | ICD-10-CM | POA: Diagnosis present

## 2022-11-12 DIAGNOSIS — R4701 Aphasia: Secondary | ICD-10-CM | POA: Diagnosis present

## 2022-11-12 DIAGNOSIS — E871 Hypo-osmolality and hyponatremia: Secondary | ICD-10-CM | POA: Diagnosis not present

## 2022-11-12 DIAGNOSIS — Z79899 Other long term (current) drug therapy: Secondary | ICD-10-CM

## 2022-11-12 DIAGNOSIS — Z85828 Personal history of other malignant neoplasm of skin: Secondary | ICD-10-CM

## 2022-11-12 DIAGNOSIS — I2510 Atherosclerotic heart disease of native coronary artery without angina pectoris: Secondary | ICD-10-CM | POA: Diagnosis present

## 2022-11-12 DIAGNOSIS — I63522 Cerebral infarction due to unspecified occlusion or stenosis of left anterior cerebral artery: Secondary | ICD-10-CM | POA: Diagnosis not present

## 2022-11-12 DIAGNOSIS — I6602 Occlusion and stenosis of left middle cerebral artery: Secondary | ICD-10-CM | POA: Diagnosis not present

## 2022-11-12 DIAGNOSIS — I739 Peripheral vascular disease, unspecified: Secondary | ICD-10-CM | POA: Diagnosis not present

## 2022-11-12 DIAGNOSIS — I872 Venous insufficiency (chronic) (peripheral): Secondary | ICD-10-CM | POA: Diagnosis present

## 2022-11-12 DIAGNOSIS — R54 Age-related physical debility: Secondary | ICD-10-CM | POA: Diagnosis present

## 2022-11-12 DIAGNOSIS — I6612 Occlusion and stenosis of left anterior cerebral artery: Secondary | ICD-10-CM | POA: Diagnosis not present

## 2022-11-12 DIAGNOSIS — I480 Paroxysmal atrial fibrillation: Secondary | ICD-10-CM | POA: Diagnosis present

## 2022-11-12 DIAGNOSIS — R1310 Dysphagia, unspecified: Secondary | ICD-10-CM | POA: Diagnosis present

## 2022-11-12 DIAGNOSIS — I4811 Longstanding persistent atrial fibrillation: Secondary | ICD-10-CM | POA: Diagnosis not present

## 2022-11-12 DIAGNOSIS — I5030 Unspecified diastolic (congestive) heart failure: Secondary | ICD-10-CM | POA: Diagnosis not present

## 2022-11-12 DIAGNOSIS — R9089 Other abnormal findings on diagnostic imaging of central nervous system: Secondary | ICD-10-CM | POA: Diagnosis not present

## 2022-11-12 DIAGNOSIS — Z803 Family history of malignant neoplasm of breast: Secondary | ICD-10-CM

## 2022-11-12 DIAGNOSIS — J810 Acute pulmonary edema: Secondary | ICD-10-CM | POA: Diagnosis not present

## 2022-11-12 DIAGNOSIS — I10 Essential (primary) hypertension: Secondary | ICD-10-CM | POA: Insufficient documentation

## 2022-11-12 DIAGNOSIS — I639 Cerebral infarction, unspecified: Secondary | ICD-10-CM | POA: Diagnosis not present

## 2022-11-12 DIAGNOSIS — I482 Chronic atrial fibrillation, unspecified: Secondary | ICD-10-CM | POA: Diagnosis not present

## 2022-11-12 DIAGNOSIS — I6523 Occlusion and stenosis of bilateral carotid arteries: Secondary | ICD-10-CM | POA: Diagnosis not present

## 2022-11-12 DIAGNOSIS — R4182 Altered mental status, unspecified: Secondary | ICD-10-CM | POA: Diagnosis not present

## 2022-11-12 DIAGNOSIS — Z79890 Hormone replacement therapy (postmenopausal): Secondary | ICD-10-CM

## 2022-11-12 DIAGNOSIS — Z7722 Contact with and (suspected) exposure to environmental tobacco smoke (acute) (chronic): Secondary | ICD-10-CM | POA: Diagnosis not present

## 2022-11-12 DIAGNOSIS — I6389 Other cerebral infarction: Secondary | ICD-10-CM | POA: Diagnosis not present

## 2022-11-12 DIAGNOSIS — I63512 Cerebral infarction due to unspecified occlusion or stenosis of left middle cerebral artery: Secondary | ICD-10-CM | POA: Diagnosis present

## 2022-11-12 DIAGNOSIS — R29810 Facial weakness: Secondary | ICD-10-CM | POA: Diagnosis present

## 2022-11-12 DIAGNOSIS — Z9049 Acquired absence of other specified parts of digestive tract: Secondary | ICD-10-CM

## 2022-11-12 DIAGNOSIS — E039 Hypothyroidism, unspecified: Secondary | ICD-10-CM | POA: Diagnosis not present

## 2022-11-12 DIAGNOSIS — E43 Unspecified severe protein-calorie malnutrition: Secondary | ICD-10-CM | POA: Diagnosis not present

## 2022-11-12 DIAGNOSIS — Z888 Allergy status to other drugs, medicaments and biological substances status: Secondary | ICD-10-CM

## 2022-11-12 DIAGNOSIS — I5032 Chronic diastolic (congestive) heart failure: Secondary | ICD-10-CM | POA: Diagnosis not present

## 2022-11-12 DIAGNOSIS — I63412 Cerebral infarction due to embolism of left middle cerebral artery: Secondary | ICD-10-CM | POA: Diagnosis not present

## 2022-11-12 DIAGNOSIS — R64 Cachexia: Secondary | ICD-10-CM | POA: Diagnosis present

## 2022-11-12 DIAGNOSIS — Z7901 Long term (current) use of anticoagulants: Secondary | ICD-10-CM

## 2022-11-12 DIAGNOSIS — Z8249 Family history of ischemic heart disease and other diseases of the circulatory system: Secondary | ICD-10-CM

## 2022-11-12 DIAGNOSIS — Z96642 Presence of left artificial hip joint: Secondary | ICD-10-CM | POA: Diagnosis present

## 2022-11-12 DIAGNOSIS — Z66 Do not resuscitate: Secondary | ICD-10-CM | POA: Diagnosis present

## 2022-11-12 DIAGNOSIS — I6522 Occlusion and stenosis of left carotid artery: Secondary | ICD-10-CM

## 2022-11-12 DIAGNOSIS — Z515 Encounter for palliative care: Secondary | ICD-10-CM | POA: Diagnosis not present

## 2022-11-12 DIAGNOSIS — Z880 Allergy status to penicillin: Secondary | ICD-10-CM

## 2022-11-12 HISTORY — PX: IR CT HEAD LTD: IMG2386

## 2022-11-12 HISTORY — PX: IR PERCUTANEOUS ART THROMBECTOMY/INFUSION INTRACRANIAL INC DIAG ANGIO: IMG6087

## 2022-11-12 HISTORY — PX: RADIOLOGY WITH ANESTHESIA: SHX6223

## 2022-11-12 LAB — BASIC METABOLIC PANEL
Anion gap: 10 (ref 5–15)
BUN: 26 mg/dL — ABNORMAL HIGH (ref 8–23)
CO2: 20 mmol/L — ABNORMAL LOW (ref 22–32)
Calcium: 8.1 mg/dL — ABNORMAL LOW (ref 8.9–10.3)
Chloride: 105 mmol/L (ref 98–111)
Creatinine, Ser: 0.94 mg/dL (ref 0.44–1.00)
GFR, Estimated: 58 mL/min — ABNORMAL LOW (ref >60)
Glucose, Bld: 92 mg/dL (ref 70–99)
Potassium: 4.6 mmol/L (ref 3.5–5.1)
Sodium: 135 mmol/L (ref 135–145)

## 2022-11-12 LAB — LIPID PANEL
Cholesterol: 121 mg/dL (ref 0–200)
HDL: 59 mg/dL (ref >40)
LDL Cholesterol: 51 mg/dL (ref 0–99)
Total CHOL/HDL Ratio: 2.1 {ratio}
Triglycerides: 55 mg/dL (ref <150)
VLDL: 11 mg/dL (ref 0–40)

## 2022-11-12 LAB — ECHOCARDIOGRAM COMPLETE
AV Vena cont: 0.1 cm
MV M vel: 4.46 m/s
MV Peak grad: 79.6 mm[Hg]
S' Lateral: 3.3 cm
Single Plane A4C EF: 61.7 %

## 2022-11-12 LAB — POCT I-STAT 7, (LYTES, BLD GAS, ICA,H+H)
Acid-base deficit: 9 mmol/L — ABNORMAL HIGH (ref 0.0–2.0)
Bicarbonate: 19.3 mmol/L — ABNORMAL LOW (ref 20.0–28.0)
Calcium, Ion: 1.15 mmol/L (ref 1.15–1.40)
HCT: 30 % — ABNORMAL LOW (ref 36.0–46.0)
Hemoglobin: 10.2 g/dL — ABNORMAL LOW (ref 12.0–15.0)
O2 Saturation: 99 %
Patient temperature: 97.6
Potassium: 3.6 mmol/L (ref 3.5–5.1)
Sodium: 137 mmol/L (ref 135–145)
TCO2: 21 mmol/L — ABNORMAL LOW (ref 22–32)
pCO2 arterial: 51.6 mm[Hg] — ABNORMAL HIGH (ref 32–48)
pH, Arterial: 7.177 — CL (ref 7.35–7.45)
pO2, Arterial: 159 mm[Hg] — ABNORMAL HIGH (ref 83–108)

## 2022-11-12 LAB — CBC
HCT: 33.3 % — ABNORMAL LOW (ref 36.0–46.0)
Hemoglobin: 10.4 g/dL — ABNORMAL LOW (ref 12.0–15.0)
MCH: 30.8 pg (ref 26.0–34.0)
MCHC: 31.2 g/dL (ref 30.0–36.0)
MCV: 98.5 fL (ref 80.0–100.0)
Platelets: 403 10*3/uL — ABNORMAL HIGH (ref 150–400)
RBC: 3.38 MIL/uL — ABNORMAL LOW (ref 3.87–5.11)
RDW: 17.1 % — ABNORMAL HIGH (ref 11.5–15.5)
WBC: 9.6 10*3/uL (ref 4.0–10.5)
nRBC: 0 % (ref 0.0–0.2)

## 2022-11-12 LAB — HEMOGLOBIN A1C
Hgb A1c MFr Bld: 5.4 % (ref 4.8–5.6)
Mean Plasma Glucose: 108.28 mg/dL

## 2022-11-12 LAB — MRSA NEXT GEN BY PCR, NASAL: MRSA by PCR Next Gen: NOT DETECTED

## 2022-11-12 LAB — HEPARIN LEVEL (UNFRACTIONATED): Heparin Unfractionated: 0.1 [IU]/mL — ABNORMAL LOW (ref 0.30–0.70)

## 2022-11-12 SURGERY — RADIOLOGY WITH ANESTHESIA
Anesthesia: General

## 2022-11-12 MED ORDER — ORAL CARE MOUTH RINSE
15.0000 mL | OROMUCOSAL | Status: DC
  Administered 2022-11-12 – 2022-11-13 (×9): 15 mL via OROMUCOSAL

## 2022-11-12 MED ORDER — ACETAMINOPHEN 160 MG/5ML PO SOLN: 650.0000 mg | ORAL | Status: DC | PRN

## 2022-11-12 MED ORDER — ROCURONIUM BROMIDE 10 MG/ML (PF) SYRINGE
PREFILLED_SYRINGE | INTRAVENOUS | Status: DC | PRN
  Administered 2022-11-12: 20 mg via INTRAVENOUS

## 2022-11-12 MED ORDER — POLYETHYLENE GLYCOL 3350 17 G PO PACK: 17.0000 g | PACK | Freq: Every day | ORAL | Status: DC

## 2022-11-12 MED ORDER — SENNOSIDES-DOCUSATE SODIUM 8.6-50 MG PO TABS
1.0000 | ORAL_TABLET | Freq: Every evening | ORAL | Status: DC | PRN
  Filled 2022-11-12: qty 1

## 2022-11-12 MED ORDER — SODIUM CHLORIDE 0.9 % IV BOLUS
500.0000 mL | Freq: Once | INTRAVENOUS | Status: AC
  Administered 2022-11-12: 500 mL via INTRAVENOUS

## 2022-11-12 MED ORDER — STROKE: EARLY STAGES OF RECOVERY BOOK
Freq: Once | Status: DC
  Filled 2022-11-12: qty 1

## 2022-11-12 MED ORDER — SODIUM CHLORIDE 0.9 % IV SOLN: INTRAVENOUS | Status: DC

## 2022-11-12 MED ORDER — ASPIRIN 81 MG PO TBEC: 81.0000 mg | DELAYED_RELEASE_TABLET | Freq: Every day | ORAL | Status: DC

## 2022-11-12 MED ORDER — FENTANYL CITRATE PF 50 MCG/ML IJ SOSY: 25.0000 ug | PREFILLED_SYRINGE | INTRAMUSCULAR | Status: DC | PRN

## 2022-11-12 MED ORDER — METOPROLOL TARTRATE 25 MG/10 ML ORAL SUSPENSION: 12.5000 mg | Freq: Two times a day (BID) | ORAL | Status: DC

## 2022-11-12 MED ORDER — ACETAMINOPHEN 325 MG PO TABS: 650.0000 mg | ORAL_TABLET | ORAL | Status: DC | PRN

## 2022-11-12 MED ORDER — ORAL CARE MOUTH RINSE: 15.0000 mL | OROMUCOSAL | Status: DC | PRN

## 2022-11-12 MED ORDER — CEFAZOLIN SODIUM-DEXTROSE 2-4 GM/100ML-% IV SOLN
INTRAVENOUS | Status: AC
Start: 2022-11-12 — End: ?
  Filled 2022-11-12: qty 100

## 2022-11-12 MED ORDER — ACETAMINOPHEN 160 MG/5ML PO SOLN
650.0000 mg | ORAL | Status: DC | PRN
  Filled 2022-11-12: qty 20.3

## 2022-11-12 MED ORDER — CEFAZOLIN SODIUM-DEXTROSE 2-3 GM-%(50ML) IV SOLR
INTRAVENOUS | Status: DC | PRN
  Administered 2022-11-12: 2 g via INTRAVENOUS

## 2022-11-12 MED ORDER — ACETAMINOPHEN 325 MG PO TABS
650.0000 mg | ORAL_TABLET | ORAL | Status: DC | PRN
  Filled 2022-11-12: qty 2

## 2022-11-12 MED ORDER — CLOPIDOGREL BISULFATE 75 MG PO TABS: 75.0000 mg | ORAL_TABLET | Freq: Every day | ORAL | Status: DC

## 2022-11-12 MED ORDER — FAMOTIDINE 20 MG PO TABS: 20.0000 mg | ORAL_TABLET | Freq: Two times a day (BID) | ORAL | Status: DC

## 2022-11-12 MED ORDER — ACETAMINOPHEN 650 MG RE SUPP: 650.0000 mg | RECTAL | Status: DC | PRN

## 2022-11-12 MED ORDER — LACTATED RINGERS IV SOLN: INTRAVENOUS | Status: DC | PRN

## 2022-11-12 MED ORDER — SUCCINYLCHOLINE CHLORIDE 200 MG/10ML IV SOSY
PREFILLED_SYRINGE | INTRAVENOUS | Status: DC | PRN
  Administered 2022-11-12: 100 mg via INTRAVENOUS

## 2022-11-12 MED ORDER — NITROGLYCERIN 1 MG/10 ML FOR IR/CATH LAB
INTRA_ARTERIAL | Status: DC | PRN
  Administered 2022-11-12 (×2): 25 ug via INTRA_ARTERIAL

## 2022-11-12 MED ORDER — FENTANYL CITRATE (PF) 250 MCG/5ML IJ SOLN
INTRAMUSCULAR | Status: DC | PRN
  Administered 2022-11-12: 100 ug via INTRAVENOUS

## 2022-11-12 MED ORDER — NITROGLYCERIN 1 MG/10 ML FOR IR/CATH LAB
INTRA_ARTERIAL | Status: AC
  Filled 2022-11-12: qty 10

## 2022-11-12 MED ORDER — IOHEXOL 350 MG/ML SOLN
75.0000 mL | Freq: Once | INTRAVENOUS | Status: AC | PRN
  Administered 2022-11-12: 75 mL via INTRAVENOUS

## 2022-11-12 MED ORDER — LIDOCAINE 2% (20 MG/ML) 5 ML SYRINGE
INTRAMUSCULAR | Status: DC | PRN
  Administered 2022-11-12: 40 mg via INTRAVENOUS

## 2022-11-12 MED ORDER — STROKE: EARLY STAGES OF RECOVERY BOOK: Freq: Once | Status: DC

## 2022-11-12 MED ORDER — SENNOSIDES-DOCUSATE SODIUM 8.6-50 MG PO TABS: 1.0000 | ORAL_TABLET | Freq: Every evening | ORAL | Status: DC | PRN

## 2022-11-12 MED ORDER — PROPOFOL 10 MG/ML IV BOLUS
INTRAVENOUS | Status: DC | PRN
  Administered 2022-11-12: 60 mg via INTRAVENOUS

## 2022-11-12 MED ORDER — ACETAMINOPHEN 650 MG RE SUPP
650.0000 mg | RECTAL | Status: DC | PRN
  Filled 2022-11-12: qty 1

## 2022-11-12 MED ORDER — FENTANYL CITRATE (PF) 100 MCG/2ML IJ SOLN
INTRAMUSCULAR | Status: AC
  Filled 2022-11-12: qty 2

## 2022-11-12 MED ORDER — FUROSEMIDE 10 MG/ML IJ SOLN
20.0000 mg | Freq: Once | INTRAMUSCULAR | Status: AC
  Administered 2022-11-12: 20 mg via INTRAVENOUS
  Filled 2022-11-12: qty 2

## 2022-11-12 MED ORDER — PHENYLEPHRINE HCL-NACL 20-0.9 MG/250ML-% IV SOLN
INTRAVENOUS | Status: DC | PRN
  Administered 2022-11-12: 40 ug/min via INTRAVENOUS

## 2022-11-12 MED ORDER — CLEVIDIPINE BUTYRATE 0.5 MG/ML IV EMUL
INTRAVENOUS | Status: AC
  Filled 2022-11-12: qty 50

## 2022-11-12 MED ORDER — IOHEXOL 350 MG/ML SOLN
40.0000 mL | Freq: Once | INTRAVENOUS | Status: AC | PRN
  Administered 2022-11-12: 40 mL via INTRAVENOUS

## 2022-11-12 MED ORDER — DILTIAZEM HCL-DEXTROSE 125-5 MG/125ML-% IV SOLN (PREMIX)
INTRAVENOUS | Status: DC | PRN
  Administered 2022-11-12: 10 mg/h via INTRAVENOUS

## 2022-11-12 MED ORDER — IOHEXOL 300 MG/ML  SOLN
150.0000 mL | Freq: Once | INTRAMUSCULAR | Status: AC | PRN
  Administered 2022-11-12: 48 mL via INTRA_ARTERIAL

## 2022-11-12 MED ORDER — METOPROLOL TARTRATE 5 MG/5ML IV SOLN
2.5000 mg | Freq: Four times a day (QID) | INTRAVENOUS | Status: DC | PRN
  Administered 2022-11-13: 2.5 mg via INTRAVENOUS
  Filled 2022-11-12: qty 5

## 2022-11-12 MED ORDER — STROKE: EARLY STAGES OF RECOVERY BOOK
Freq: Once | Status: DC
Start: 2022-11-13 — End: 2022-11-12

## 2022-11-12 MED ORDER — PROPOFOL 1000 MG/100ML IV EMUL: 0.0000 ug/kg/min | INTRAVENOUS | Status: DC

## 2022-11-12 MED ORDER — DOCUSATE SODIUM 50 MG/5ML PO LIQD: 100.0000 mg | Freq: Two times a day (BID) | ORAL | Status: DC

## 2022-11-12 MED ORDER — MIDAZOLAM HCL 2 MG/2ML IJ SOLN
0.5000 mg | Freq: Once | INTRAMUSCULAR | Status: AC
  Administered 2022-11-12: 0.5 mg via INTRAVENOUS
  Filled 2022-11-12: qty 2

## 2022-11-12 MED ORDER — HEPARIN (PORCINE) 25000 UT/250ML-% IV SOLN
550.0000 [IU]/h | INTRAVENOUS | Status: DC
  Administered 2022-11-12: 400 [IU]/h via INTRAVENOUS
  Filled 2022-11-12: qty 250

## 2022-11-12 MED ORDER — CHLORHEXIDINE GLUCONATE CLOTH 2 % EX PADS
6.0000 | MEDICATED_PAD | Freq: Every day | CUTANEOUS | Status: DC
  Administered 2022-11-12: 6 via TOPICAL

## 2022-11-12 MED ORDER — CLEVIDIPINE BUTYRATE 0.5 MG/ML IV EMUL
INTRAVENOUS | Status: AC
  Administered 2022-11-12: 2 mg/h via INTRAVENOUS
  Filled 2022-11-12: qty 50

## 2022-11-12 MED ORDER — CLEVIDIPINE BUTYRATE 0.5 MG/ML IV EMUL: 0.0000 mg/h | INTRAVENOUS | Status: DC

## 2022-11-12 NOTE — H&P (Addendum)
Admission H&P    Chief Complaint: Altered mental status  HPI: Elizabeth Fields is an 89 y.o. female with a PMHx of CKD, atrial fibrillation on warfarin, aortic valve insuffiency, CAD, CKD stage IIIb, HFpEF and chronic venous stasis dermatitis with ulcers who presented to the [facility] ED on Sunday evening with acute onset of altered mental status with somnolence which had occurred abruptly while talking to her relative over the phone. LKN was 5 PM on Sunday. Her relative called her neighbor who brought her to the emergency room.  The patient was nonverbal on arrival and was having left gaze preference. HR was 119 on arrival with otherwise normal vital signs.  Labs revealed mild hyponatremia 133 and a CO2 of 20 with a BUN of 25 and creatinine 1.11 with albumin 2.9 and AST 67 with CBC showing anemia.  UA was negative. EKG showed atrial fibrillation with rapid ventricular sponsor 121.   Head CT scan revealed atrophy and chronic small vessel ischemic changes of the white matter with no acute intracranial normality.  MRI brain at 2349 revealed an acute left MCA distribution infarct involving the left basal ganglia, corona radiata, and insula. DWI FLAIR mismatch was present. There was loss of normal flow void within the left ICA to the terminus, suggestive of slow flow and/or occlusion. Small subdural collections overlying the bilateral cerebral convexities measuring up to 2 mm bilaterally were also noted, felt most likely to represent small chronic bilateral subdural hematomas, with no significant mass effect or midline shift. Underlying age-related cerebral atrophy with mild chronic small vessel ischemic disease was also noted.  CTA of head and neck was then obtained, revealing an occluded Left ICA in the neck (about 2.5 cm from the origin), with occluded Left ICA siphon, terminus, and occluded Left MCA. Reconstituted Left ACA via the anterior communicating artery was noted.  Neurology was called and after evaluation  of the above imaging findings, emergent thrombectomy was anticipated. CTP was ordered to confirm salvageable penumbra, revealing mismatch of Left MCA oligemia (estimated 155 mL) and core infarct volume (estimated between 33 and 77 mL when considering both CBF and Tmax >10s), with hypoperfusion index of 0.5.  NIHSS per communication with team at [facility] was 35 after CTP was obtained.   The patient's condition was discussed over the telephone with her nephew, who is her next of kin. She has no disability at baseline. Risks/benefits of thrombectomy versus no treatment were discussed at length, with nephew expressing understanding and consent to proceed with mechanical thrombectomy.   Code IR was called and emergent transfer to [facility] for IR was initiated.   Of note, she was recently discharged from [facility] on 10/31 with acute blood loss anemia in the setting of a supratherapeutic INR of 6.6 with right leg bleeding. Warfarin was held and vitamin K was administered. She was discharged off warfarin due to her anemia and bleeding.   Past Medical History:  Diagnosis Date   Actinic keratosis    Actinic keratosis 11/02/2019   left calf, bx proven   Allergy    Atrial fibrillation (HCC)    CHF (congestive heart failure) (HCC)    History of squamous cell carcinoma    Hyperlipidemia    Hypertension    Renal insufficiency    Squamous cell carcinoma of skin 02/25/2017   Right cheek. KA type.   Thyroid disease     Past Surgical History:  Procedure Laterality Date   ANTERIOR APPROACH HEMI HIP ARTHROPLASTY Left 02/12/2019   Procedure:  ANTERIOR APPROACH HEMI HIP ARTHROPLASTY;  Surgeon: Kennedy Bucker, MD;  Location: [facility] ORS;  Service: Orthopedics;  Laterality: Left;   CHOLECYSTECTOMY     HIP SURGERY      Family History  Problem Relation Age of Onset   Breast cancer Mother 35   Heart disease Father    Social History:  reports that she has never smoked. She has been exposed to tobacco smoke. She has never  used smokeless tobacco. She reports that she does not drink alcohol and does not use drugs.  Allergies:  Allergies  Allergen Reactions   Fosamax [Alendronate Sodium] Nausea And Vomiting   Other Rash   Penicillins Nausea And Vomiting and Palpitations    Medications Prior to Admission  Medication Sig Dispense Refill   acetaminophen (TYLENOL) 500 MG tablet Take 1-2 tablets (500-1,000 mg total) by mouth every 8 (eight) hours as needed for mild pain or moderate pain. 30 tablet 0   Cholecalciferol 50 MCG (2000 UT) CAPS Take 2,000 Units by mouth daily.     esomeprazole (NEXIUM) 40 MG capsule Take 40 mg by mouth daily at 12 noon.     feeding supplement, ENSURE ENLIVE, (ENSURE ENLIVE) LIQD Take 237 mLs by mouth 2 (two) times daily between meals. 237 mL 12   ferrous fumarate-b12-vitamic C-folic acid (TRINSICON / FOLTRIN) capsule Take 1 capsule by mouth 2 (two) times daily. 30 capsule 0   furosemide (LASIX) 20 MG tablet Take 1 tablet (20 mg total) by mouth daily. 30 tablet 0   levothyroxine (SYNTHROID, LEVOTHROID) 100 MCG tablet Take 100 mcg by mouth daily before breakfast.     magnesium oxide (MAG-OX) 400 MG tablet Take 400 mg by mouth daily.     methocarbamol (ROBAXIN) 500 MG tablet Take 500 mg by mouth every 6 (six) hours as needed for muscle spasms.     metoprolol succinate (TOPROL-XL) 50 MG 24 hr tablet Take 1 tablet (50 mg total) by mouth daily. Take with or immediately following a meal.     simvastatin (ZOCOR) 20 MG tablet Take 20 mg by mouth daily.     spironolactone (ALDACTONE) 25 MG tablet Take 25 mg by mouth daily.     traMADol (ULTRAM) 50 MG tablet Take 50 mg by mouth every 6 (six) hours as needed.     triamcinolone ointment (KENALOG) 0.1 % Apply 1 Application topically 2 (two) times daily as needed (Rash). To affected areas at legs. Avoid applying to face, groin, and axilla. Use as directed. Long-term use can cause thinning of the skin. 454 g 1    ROS: Unable to obtain due to  obtundation.    Physical Examination: There were no vitals taken for this visit.  Physical Exam  General: Frail-appearing elderly female  HEENT-  [place]/AT    Lungs- Respirations unlabored Extremities- No edema  Neurological Examination Mental Status: Obtunded. Unable to follow any commands. Will move left side and grimace to noxious stimuli. Nonverbal.   Cranial Nerves: II: No blink to threat bilaterally. PERRL  III,IV, VI: Eyes are closed and do not open to stimuli. When lids are elevated, eyes are conjugate and at the midline. No nystagmus. No oculocephalic reflex elicitable.   V: Reacts to brow ridge pressure on the left but not on the right.   VII: Right facial droop when grimacing to noxious VIII: Not responding to voice IX,X: Gag reflex deferred XI: Unable to assess XII: Not following commands for assessment Motor/Sensory: Not following commands for formal testing.  LUE with movement  to noxious including light swatting antigravity movements. Drifts to bed after passive elevation and release.  LLE withdraws to noxious RUE with increased tone and no movement to any stimuli. Drops immediately to bed after passive elevation and release.  RLE with minimal movement to noxious stimulation.  Deep Tendon Reflexes: 2+ bilateral brachioradialis Cerebellar: Unable to assess Gait: Unable to assess  NIHSS: 34  Results for orders placed or performed during the hospital encounter of 11/11/22 (from the past 48 hour(s))  Protime-INR     Status: Abnormal   Collection Time: 11/11/22  6:15 PM  Result Value Ref Range   Prothrombin Time 15.3 (H) 11.4 - 15.2 seconds   INR 1.2 0.8 - 1.2    Comment: (NOTE) INR goal varies based on device and disease states. Performed at [facility], [address] [place], [place] [address]   APTT     Status: None   Collection Time: 11/11/22  6:15 PM  Result Value Ref Range   aPTT 24 24 - 36 seconds    Comment: Performed at [facility], [address] [place], [place] [address]  Lactic acid, plasma     Status: None   Collection Time: 11/11/22  6:42 PM  Result Value Ref Range   Lactic Acid, Venous 1.8 0.5 - 1.9 mmol/L    Comment: Performed at [facility], [address] [place], [place] [address]  Blood Culture (routine x 2)     Status: None (Preliminary result)   Collection Time: 11/11/22  6:42 PM   Specimen: Right Antecubital; Blood  Result Value Ref Range   Specimen Description RIGHT ANTECUBITAL    Special Requests      BOTTLES DRAWN AEROBIC AND ANAEROBIC Blood Culture results may not be optimal due to an inadequate volume of blood received in culture bottles   Culture      NO GROWTH < 12 HOURS Performed at [facility], [address] [place], [place] [address]    Report Status PENDING   CBC with Differential/Platelet     Status: Abnormal   Collection Time: 11/11/22  6:42 PM  Result Value Ref Range   WBC 7.5 4.0 - 10.5 K/uL   RBC 3.08 (L) 3.87 - 5.11 MIL/uL   Hemoglobin 9.7 (L) 12.0 - 15.0 g/dL   HCT 95.2 (L) 84.1 - 32.4 %   MCV 102.9 (H) 80.0 - 100.0 fL   MCH 31.5 26.0 - 34.0 pg   MCHC 30.6 30.0 - 36.0 g/dL   RDW 40.1 (H) 02.7 - 25.3 %   Platelets 368 150 - 400 K/uL   nRBC 0.0 0.0 - 0.2 %   Neutrophils Relative % 73 %   Neutro Abs 5.5 1.7 - 7.7 K/uL   Lymphocytes Relative 12 %   Lymphs Abs 0.9 0.7 - 4.0 K/uL   Monocytes Relative 12 %   Monocytes Absolute 0.9 0.1 - 1.0 K/uL   Eosinophils Relative 1 %   Eosinophils Absolute 0.1 0.0 - 0.5 K/uL   Basophils Relative 1 %   Basophils Absolute 0.1 0.0 - 0.1 K/uL   Immature Granulocytes 1 %   Abs Immature Granulocytes 0.07 0.00 - 0.07 K/uL    Comment: Performed at [facility], [address] [place], [place] [address]  Blood Culture (routine x 2)     Status: None (Preliminary result)   Collection Time: 11/11/22  6:48 PM   Specimen: Right Antecubital; Blood  Result Value Ref Range   Specimen Description  RIGHT  ANTECUBITAL    Special Requests      BOTTLES DRAWN AEROBIC AND ANAEROBIC Blood Culture adequate volume   Culture      NO GROWTH < 12 HOURS Performed at [facility], [address] [place], [place] [address]    Report Status PENDING   Urinalysis, w/ Reflex to Culture (Infection Suspected) -Urine, Clean Catch     Status: Abnormal   Collection Time: 11/11/22  7:42 PM  Result Value Ref Range   Specimen Source URINE, CLEAN CATCH    Color, Urine YELLOW (A) YELLOW   APPearance CLEAR (A) CLEAR   Specific Gravity, Urine 1.012 1.005 - 1.030   pH 5.0 5.0 - 8.0   Glucose, UA NEGATIVE NEGATIVE mg/dL   Hgb urine dipstick NEGATIVE NEGATIVE   Bilirubin Urine NEGATIVE NEGATIVE   Ketones, ur NEGATIVE NEGATIVE mg/dL   Protein, ur NEGATIVE NEGATIVE mg/dL   Nitrite NEGATIVE NEGATIVE   Leukocytes,Ua NEGATIVE NEGATIVE   RBC / HPF 0-5 0 - 5 RBC/hpf   WBC, UA 0-5 0 - 5 WBC/hpf    Comment:        Reflex urine culture not performed if WBC <=10, OR if Squamous epithelial cells >5. If Squamous epithelial cells >5 suggest recollection.    Bacteria, UA NONE SEEN NONE SEEN   Squamous Epithelial / HPF 0-5 0 - 5 /HPF   Mucus PRESENT    Hyaline Casts, UA PRESENT     Comment: Performed at [facility], [address] [place], [place] [address]  Comprehensive metabolic panel     Status: Abnormal   Collection Time: 11/11/22  8:26 PM  Result Value Ref Range   Sodium 133 (L) 135 - 145 mmol/L   Potassium 4.6 3.5 - 5.1 mmol/L   Chloride 104 98 - 111 mmol/L   CO2 20 (L) 22 - 32 mmol/L   Glucose, Bld 92 70 - 99 mg/dL    Comment: Glucose reference range applies only to samples taken after fasting for at least 8 hours.   BUN 25 (H) 8 - 23 mg/dL   Creatinine, Ser 0.93 (H) 0.44 - 1.00 mg/dL   Calcium 8.1 (L) 8.9 - 10.3 mg/dL   Total Protein 6.6 6.5 - 8.1 g/dL   Albumin 2.9 (L) 3.5 - 5.0 g/dL   AST 67 (H) 15 - 41 U/L   ALT 35 0 - 44 U/L   Alkaline Phosphatase 115 38 - 126 U/L   Total  Bilirubin 1.0 0.3 - 1.2 mg/dL   GFR, Estimated 48 (L) >60 mL/min    Comment: (NOTE) Calculated using the CKD-EPI Creatinine Equation (2021)    Anion gap 9 5 - 15    Comment: Performed at [facility], [address], [place], [place] [address]  Basic metabolic panel     Status: Abnormal   Collection Time: 11/12/22  2:01 AM  Result Value Ref Range   Sodium 135 135 - 145 mmol/L   Potassium 4.6 3.5 - 5.1 mmol/L   Chloride 105 98 - 111 mmol/L   CO2 20 (L) 22 - 32 mmol/L   Glucose, Bld 92 70 - 99 mg/dL    Comment: Glucose reference range applies only to samples taken after fasting for at least 8 hours.   BUN 26 (H) 8 - 23 mg/dL   Creatinine, Ser 3.22 0.44 - 1.00 mg/dL   Calcium 8.1 (L) 8.9 - 10.3 mg/dL   GFR, Estimated 58 (L) >60 mL/min    Comment: (NOTE)  Calculated using the CKD-EPI Creatinine Equation (2021)    Anion gap 10 5 - 15    Comment: Performed at [facility], [address] [place], [place] [address]  CBC     Status: Abnormal   Collection Time: 11/12/22  2:01 AM  Result Value Ref Range   WBC 9.6 4.0 - 10.5 K/uL   RBC 3.38 (L) 3.87 - 5.11 MIL/uL   Hemoglobin 10.4 (L) 12.0 - 15.0 g/dL   HCT 19.1 (L) 47.8 - 29.5 %   MCV 98.5 80.0 - 100.0 fL   MCH 30.8 26.0 - 34.0 pg   MCHC 31.2 30.0 - 36.0 g/dL   RDW 62.1 (H) 30.8 - 65.7 %   Platelets 403 (H) 150 - 400 K/uL   nRBC 0.0 0.0 - 0.2 %    Comment: Performed at [facility], [address] [place], [place] [address]  Lipid panel     Status: None   Collection Time: 11/12/22  4:48 AM  Result Value Ref Range   Cholesterol 121 0 - 200 mg/dL   Triglycerides 55 <295 mg/dL   HDL 59 >28 mg/dL   Total CHOL/HDL Ratio 2.1 RATIO   VLDL 11 0 - 40 mg/dL   LDL Cholesterol 51 0 - 99 mg/dL    Comment:        Total Cholesterol/HDL:CHD Risk Coronary Heart Disease Risk Table                     Men   Women  1/2 Average Risk   3.4   3.3  Average Risk       5.0   4.4  2 X Average Risk   9.6   7.1  3 X  Average Risk  23.4   11.0        Use the calculated Patient Ratio above and the CHD Risk Table to determine the patient's CHD Risk.        ATP III CLASSIFICATION (LDL):  <100     mg/dL   Optimal  413-244  mg/dL   Near or Above                    Optimal  130-159  mg/dL   Borderline  010-272  mg/dL   High  >536     mg/dL   Very High Performed at [facility], [address] [place], [place] [address]    CT CEREBRAL PERFUSION W CONTRAST  Result Date: 11/12/2022 CLINICAL DATA:  89 year old female with occluded cervical left ICA, left ICA siphon, left MCA. Patchy left MCA infarct on MRI 0033 hours today. EXAM: CT PERFUSION BRAIN TECHNIQUE: Multiphase CT imaging of the brain was performed following IV bolus contrast injection. Subsequent parametric perfusion maps were calculated using RAPID software. RADIATION DOSE REDUCTION: This exam was performed according to the departmental dose-optimization program which includes automated exposure control, adjustment of the mA and/or kV according to patient size and/or use of iterative reconstruction technique. CONTRAST:  40mL OMNIPAQUE IOHEXOL 350 MG/ML SOLN COMPARISON:  Brain MRI 0033 hours and CTA head and neck 0414 hours today. FINDINGS: ASPECTS: 9 (abnormal left insula 2059 hours yesterday). CBF (<30%) Volume: 16mL, with range up to 33 mL (CBF less than 38%). Only 5 mL of abnormal CBV detected. Perfusion (Tmax>6.0s) volume: , hypoperfusion index of 0.5 (T-max greater than 10s volume estimated at 77 mL). Mismatch Volume: Up to , minimum mismatch estimated at 78 mL. Infarction Location:Left MCA IMPRESSION:  Positive for mismatch of Left MCA oligemia (estimated 155 mL) and core infarct volume (estimated between 33 and 77 mL when considering both CBF and Tmax >10s). Hypoperfusion index of 0.5. Electronically Signed   By: Odessa Fleming M.D.   On: 11/12/2022 05:44   CT ANGIO HEAD NECK W WO CM  Result Date: 11/12/2022 CLINICAL DATA:  89 year old  female with altered mental status. Left MCA infarct by MRI and abnormal left ICA flow void. EXAM: CT ANGIOGRAPHY HEAD AND NECK TECHNIQUE: Multidetector CT imaging of the head and neck was performed using the standard protocol during bolus administration of intravenous contrast. Multiplanar CT image reconstructions and MIPs were obtained to evaluate the vascular anatomy. Carotid stenosis measurements (when applicable) are obtained utilizing NASCET criteria, using the distal internal carotid diameter as the denominator. RADIATION DOSE REDUCTION: This exam was performed according to the departmental dose-optimization program which includes automated exposure control, adjustment of the mA and/or kV according to patient size and/or use of iterative reconstruction technique. CONTRAST:  75mL OMNIPAQUE IOHEXOL 350 MG/ML SOLN COMPARISON:  Brain MRI 0037 hours today.  No prior CTA. FINDINGS: CTA NECK Skeleton: Cervical spine degeneration with spondylolisthesis and some dextroconvex scoliosis. No acute osseous abnormality identified. Upper chest: Layering pleural effusions with simple fluid density, at least moderate. Upper lobe pulmonary septal thickening and patchy asymmetric ground-glass opacity. No superior mediastinal lymphadenopathy identified. Other neck: Negative. Aortic arch: 3 vessel arch.  Calcified aortic atherosclerosis. Right carotid system: Brachiocephalic artery origin not entirely included, but the brachiocephalic and right CCA origin appear normal. Minor calcified plaque at the right carotid bifurcation. No right carotid stenosis to the skull base. Left carotid system: Normal left CCA origin. Mildly tortuous proximal left CCA. Calcified plaque at the left ICA origin which is less than 50 % stenosis with respect to the distal vessel. Occluded left ICA in the neck, with fading contrast enhancement beginning at the bulb. Absent left ICA enhancement about 2.5 cm from the origin. No reconstitution to the skull  base. Vertebral arteries: Proximal right subclavian artery and right vertebral artery origin are patent with no significant plaque or stenosis. Right vertebral remains patent to the skull base with no significant plaque or stenosis. Proximal left subclavian artery soft and calcified plaque without significant stenosis. Left vertebral artery origin is normal. Tortuous left V1 segment. Left vertebral artery is codominant and patent to the skull base with no significant stenosis. CTA HEAD Posterior circulation: Codominant distal vertebral arteries and vertebrobasilar junction, PICA origins are patent with no significant plaque or stenosis. Patent basilar artery without stenosis. Patent SCA and PCA origins. Posterior communicating arteries are diminutive or absent. Bilateral PCA branches are within normal limits. Anterior circulation: Left ICA siphon and terminus are occluded. Reconstituted left ACA A1 enhancement. Left MCA is occluded. Poor appearance of left MCA collaterals. Contralateral right ICA siphon is patent with mild to moderate right siphon calcified plaque. Only mild right siphon stenosis in the supraclinoid segment. Patent right ICA terminus, right MCA and ACA origins. Normal anterior communicating artery. Patent and fairly symmetric bilateral ACA branches, mild motion artifact at the distal ACAs. Right MCA M1 and trifurcation are patent without stenosis. Right MCA branches are within normal limits. Venous sinuses: Early contrast timing, not evaluated. Anatomic variants: None. Review of the MIP images confirms the above findings IMPRESSION: 1. Positive Large Vessel Occlusion: Occluded Left ICA in the neck (about 2.5 cm from the origin), with occluded Left ICA siphon, terminus, and occluded Left MCA. 2. Reconstituted Left ACA  via the anterior communicating artery. Mild for age other atherosclerosis in the head and neck with no other hemodynamically significant stenosis. Aortic Atherosclerosis (ICD10-I70.0).  3. Evidence of Acute Pulmonary Edema and at least moderate layering pleural effusions in the upper chest. #1 discussed by telephone with Dr. Valente David on 11/12/2022 at 04:32 . Electronically Signed   By: Odessa Fleming M.D.   On: 11/12/2022 04:35   MR BRAIN WO CONTRAST  Result Date: 11/12/2022 CLINICAL DATA:  Initial evaluation for altered mental status. EXAM: MRI HEAD WITHOUT CONTRAST TECHNIQUE: Multiplanar, multiecho pulse sequences of the brain and surrounding structures were obtained without intravenous contrast. COMPARISON:  Prior CT from earlier the same day. FINDINGS: Brain: Examination moderately degraded by motion artifact. Generalized age-related cerebral atrophy. Mild chronic microvascular ischemic disease noted involving the periventricular and deep white matter both cerebral hemispheres. Patchy and confluent restricted diffusion involving the left basal ganglia, corona radiata, and left insula, consistent with an acute left MCA distribution infarct. No associated hemorrhage or mass effect. No other evidence for acute or subacute ischemia. No acute intracranial hemorrhage. No mass lesion or mass effect. No hydrocephalus. Small subdural collections overlying the bilateral cerebral convexities measure up to 2 mm bilaterally, likely small chronic bilateral subdural hematomas. No acute blood products seen on prior head CT. No significant mass effect or midline shift. Pituitary gland and suprasellar region within normal limits. Vascular: Loss of normal flow void within the left ICA to the terminus, which could reflect slow flow and/or occlusion. Major intracranial vascular flow voids are otherwise grossly maintained on this motion degraded exam. Skull and upper cervical spine: Craniocervical junction within normal limits. Bone marrow signal intensity normal. No scalp soft tissue abnormality. Sinuses/Orbits: Prior bilateral ocular lens replacement. Paranasal sinuses are largely clear. No significant mastoid  effusion. Other: None. IMPRESSION: 1. Acute left MCA distribution infarct involving the left basal ganglia, corona radiata, and insula. No associated hemorrhage or mass effect. 2. Loss of normal flow void within the left ICA to the terminus, which could reflect slow flow and/or occlusion. 3. Small subdural collections overlying the bilateral cerebral convexities measuring up to 2 mm bilaterally, likely small chronic bilateral subdural hematomas. No acute blood products seen on prior head CT. No significant mass effect or midline shift. 4. Underlying age-related cerebral atrophy with mild chronic small vessel ischemic disease. Results were communicated by telephone at the time of interpretation on 11/12/2022 at 12:14 am to provider Dr. Loleta Rose. Electronically Signed   By: Rise Mu M.D.   On: 11/12/2022 00:18   DG Abd Portable 1V  Result Date: 11/11/2022 CLINICAL DATA:  Altered mental status, pre MRI metal screening EXAM: PORTABLE ABDOMEN - 1 VIEW COMPARISON:  None Available. FINDINGS: No unexpected metallic foreign body identified within the abdomen and pelvis save for multiple metallic buttons overlying the patient. Normal abdominal gas pattern. No gross free intraperitoneal gas. Cholecystectomy clips are seen in the right upper quadrant. No organomegaly. Advanced vascular calcifications. Left hip bipolar hemiarthroplasty and right hip ORIF noted. Advanced degenerative changes are seen within the lumbar spine with compression deformities of T12, L1, and L2, not well profiled on this examination. IMPRESSION: 1. No unexpected metallic foreign body identified within the abdomen and pelvis. 2. Advanced vascular calcifications. 3. Advanced degenerative changes within the lumbar spine with compression deformities of T12, L1, and L2, not well profiled on this examination. Electronically Signed   By: Helyn Numbers M.D.   On: 11/11/2022 23:07   CT Head Wo Contrast  Result Date: 11/11/2022 CLINICAL  DATA:  Mental status change EXAM: CT HEAD WITHOUT CONTRAST TECHNIQUE: Contiguous axial images were obtained from the base of the skull through the vertex without intravenous contrast. RADIATION DOSE REDUCTION: This exam was performed according to the departmental dose-optimization program which includes automated exposure control, adjustment of the mA and/or kV according to patient size and/or use of iterative reconstruction technique. COMPARISON:  CT brain 09/06/2021 FINDINGS: Brain: No acute territorial infarction, hemorrhage or intracranial mass. Moderate atrophy. Mild chronic small vessel ischemic changes the white matter. Stable ventricle size. Vascular: No hyperdense vessels.  Carotid vascular calcification. Skull: Normal. Negative for fracture or focal lesion. Sinuses/Orbits: No acute finding. Other: None. IMPRESSION: 1. No CT evidence for acute intracranial abnormality. 2. Atrophy and chronic small vessel ischemic changes of the white matter. Electronically Signed   By: Jasmine Pang M.D.   On: 11/11/2022 20:07   DG Chest Port 1 View  Result Date: 11/11/2022 CLINICAL DATA:  Altered mental status, possible sepsis EXAM: PORTABLE CHEST 1 VIEW COMPARISON:  11/02/2022 FINDINGS: Lungs are clear.  No pleural effusion or pneumothorax. Cardiomegaly. IMPRESSION: Cardiomegaly. No acute cardiopulmonary disease. Electronically Signed   By: Charline Bills M.D.   On: 11/11/2022 19:53     Assessment: 89 year old female presenting in transfer from [facility] as  - Exam reveals an obtunded patient with right hemiplegia. NIHSS 34.  Imaging findings as per HPI - EKG: Atrial fibrillation; LAD, consider LAFB or inferior infarct; Consider anterior infarct; ST depression, probably rate related; Prolonged QT interval - The patient is not a candidate for TNK due to anticoagulation and time criteria.  - Modified Rankin scale: 0 - The patient is a VIR candidate. Risks/benefits of the procedure were discussed extensively with  the patient's nephew by telephone [facility], [phone]), including approximately 50% chance of significant improvement relative to an approximate 10% chance of subarachnoid hemorrhage with possibility of significant worsening including death. The patient's nephew expressed understanding and provided informed consent to proceed with VIR. All questions answered.      Plan:  - Admitting to Neuro ICU following VIR.  - Post-VIR order set to include frequent neuro checks and BP management.  - No antiplatelet medications or anticoagulants for at least 24 hours following VIR unless otherwise indicated if stenting is required.  - Warfarin was stopped during prior admission to [facility] in October due to bleeding complications in the setting of supratherapeutic INR. Consider starting apixaban after 24 hours following IR if repeat CT at that time is negative for hemorrhagic conversion.   - DVT prophylaxis with SCDs.  - Will need to be started on a statin.  - TTE.  - Repeat MRI brain after VIR.  - PT/OT/Speech.  - NPO until passes swallow evaluation.  - Glycemic control.  - Telemetry monitoring - Fasting lipid panel, HgbA1c - CCM has been consulted for vent management if she remains intubated after IR, as well as for the pulmonary edema seen on CTA (see addendum below).  - Stroke Team has been notified of her pending admission to the floor after IR. - Nephew states that she is to be DNR after extubation following IR (can be intubated only for procedures).     Addendum: CTA also reveals evidence of Acute Pulmonary Edema and at least moderate layering pleural effusions in the upper chest. Will need CCM input regarding possible additional diagnostic testing and management.   40 minutes spent in the neurological evaluation and management of this critically ill  patient  Electronically signed: Dr. Caryl Pina 11/12/2022, 7:11 AM

## 2022-11-12 NOTE — Discharge Summary (Signed)
Physician Discharge Summary  Patient ID: UVA RUNKEL MRN: [id number] DOB/AGE: 05-10-1931 89 y.o.  Admit date: 11/11/2022 Discharge date: 11/12/2022  Admission Diagnoses:  Discharge Diagnoses:  Principal Problem:   Acute CVA (cerebrovascular accident) [facility]) Active Problems:   Hypothyroidism   Acute encephalopathy   Essential hypertension   Dyslipidemia   Chronic atrial fibrillation with RVR (HCC)   Discharged Condition: stable  Hospital Course:  HISTORY OF PRESENT ILLNESS:  Elizabeth Fields is a 89 y.o. female with medical history significant for chronic venous stasis dermatitis with ulcers, atrial fibrillation on warfarin, aortic valve insufficiency, CAD, CKD stage IIIb, HFpEF, who presents to the ED with acute onset of altered mental status with somnolence while talking to her son over the phone and confusion around 5 PM.  Her son called her neighbor who brought her to the emergency room.  The patient was nonverbal during my interview and was having left gaze preference. No reported fever or chills.  No reported chest pain or palpitations.  No reported cough or wheezing or dyspnea.   ED Course: When she came to the ER, heart rate was 119 with otherwise normal vital signs.  Labs revealed mild hyponatremia 133 and a CO2 of 20 with a BUN of 25 and creatinine 1.11 with albumin 2.9 and AST 67 with CBC showing anemia.  UA was been negative. Inpatient Course: Pt admitted to a progressive unit observation bed for further evaluation and management. After neurology reviewed imaging, pt was deemed a candidate for thrombectomy. Pt to be transferred to [facility]. CT perfusion scan completed prior to transfer.  Consults: neurology  Significant Diagnostic Studies:  Brain MRI without contrast revealed the following: 1. Acute left MCA distribution infarct involving the left basal ganglia, corona radiata, and insula. No associated hemorrhage or mass effect. 2. Loss of normal flow void within the left ICA  to the terminus, which could reflect slow flow and/or occlusion. 3. Small subdural collections overlying the bilateral cerebral convexities measuring up to 2 mm bilaterally, likely small chronic bilateral subdural hematomas. No acute blood products seen on prior head CT. No significant mass effect or midline shift. 4. Underlying age-related cerebral atrophy with mild chronic small vessel ischemic disease.\  CT perfusion: IMPRESSION: Positive for mismatch of Left MCA oligemia (estimated 155 mL) and core infarct volume (estimated between 33 and 77 mL when considering both CBF and Tmax >10s). Hypoperfusion index of 0.5.  CTA: IMPRESSION: 1. Positive Large Vessel Occlusion: Occluded Left ICA in the neck (about 2.5 cm from the origin), with occluded Left ICA siphon, terminus, and occluded Left MCA. 2. Reconstituted Left ACA via the anterior communicating artery. Mild for age other atherosclerosis in the head and neck with no other hemodynamically significant stenosis. Aortic Atherosclerosis (ICD10-I70.0). 3. Evidence of Acute Pulmonary Edema and at least moderate layering pleural effusions in the upper chest.    Treatments:  Assessment and Plan: * Acute CVA (cerebrovascular accident) (HCC) - This is manifested by left acute MCA territory infarction. - The patient has subsequent aphasia and right-sided hemiparesis with left gaze preference as well as acute encephalopathy. - The patient will be admitted to an observation progressive unit bed.   - We will follow neuro checks q.4 hours for 24 hours.   - The patient will be placed on aspirin.   - Will obtain a CT of the head and neck and 2D echo with bubble study .   - A neurology consultation  as well as physical/occupation/speech therapy consults will be  obtained in a.m.Marland Kitchen - I notified Dr. Amada Jupiter about the patient. - The patient will be placed on statin therapy and fasting lipids will be checked.       Chronic atrial  fibrillation with RVR (HCC) - This is currently controlled. - We will continue beta-blocker therapy. - We will continue IV Cardizem drip while monitoring blood pressure. - The patient has been on Coumadin in the past and it was held due to bleeding from venous stasis ulcers.   Dyslipidemia - We will continue therapy and check fasting lipids.   Essential hypertension - We will continue antihypertensive therapy with permissive parameters.   Hypothyroidism - We will continue Synthroid.  Discharge Exam: Blood pressure 117/68, pulse 89, temperature 97.6 F (36.4 C), temperature source Oral, resp. rate (!) 22, height 4\' 11"  (1.499 m), weight 38 kg, SpO2 98%. General appearance: cachectic and lethargic Resp: clear to auscultation bilaterally Cardio: regular rate and afib rhythm, S1, S2 normal, no murmur, click, rub or gallop GI: soft, non-tender; bowel sounds normal; no masses,  no organomegaly Pulses: 2+ and symmetric Neurologic: Mental status: Alert, oriented, thought content appropriate, alertness: lethargic, orientation: aphasic  Disposition:  - Pt being transferred to [facility] for IR thrombectomy.   Allergies as of 11/12/2022       Reactions   Fosamax [alendronate Sodium] Nausea And Vomiting   Other Rash   Penicillins Nausea And Vomiting, Palpitations        Medication List     TAKE these medications    acetaminophen 500 MG tablet Commonly known as: TYLENOL Take 1-2 tablets (500-1,000 mg total) by mouth every 8 (eight) hours as needed for mild pain or moderate pain.   Cholecalciferol 50 MCG (2000 UT) Caps Take 2,000 Units by mouth daily.   esomeprazole 40 MG capsule Commonly known as: NEXIUM Take 40 mg by mouth daily at 12 noon.   feeding supplement Liqd Take 237 mLs by mouth 2 (two) times daily between meals.   ferrous fumarate-b12-vitamic C-folic acid capsule Commonly known as: TRINSICON / FOLTRIN Take 1 capsule by mouth 2 (two) times daily.   furosemide 20 MG  tablet Commonly known as: LASIX Take 1 tablet (20 mg total) by mouth daily.   levothyroxine 100 MCG tablet Commonly known as: SYNTHROID Take 100 mcg by mouth daily before breakfast.   magnesium oxide 400 MG tablet Commonly known as: MAG-OX Take 400 mg by mouth daily.   methocarbamol 500 MG tablet Commonly known as: ROBAXIN Take 500 mg by mouth every 6 (six) hours as needed for muscle spasms.   metoprolol succinate 50 MG 24 hr tablet Commonly known as: TOPROL-XL Take 1 tablet (50 mg total) by mouth daily. Take with or immediately following a meal.   simvastatin 20 MG tablet Commonly known as: ZOCOR Take 20 mg by mouth daily.   spironolactone 25 MG tablet Commonly known as: ALDACTONE Take 25 mg by mouth daily.   traMADol 50 MG tablet Commonly known as: ULTRAM Take 50 mg by mouth every 6 (six) hours as needed.   triamcinolone ointment 0.1 % Commonly known as: KENALOG Apply 1 Application topically 2 (two) times daily as needed (Rash). To affected areas at legs. Avoid applying to face, groin, and axilla. Use as directed. Long-term use can cause thinning of the skin.         Signed: Helmut Muster, MSN, APRN, AGACNP-BC [facility] Pager: [phone]. Check Amion for Availability  11/12/2022, 6:04 AM

## 2022-11-12 NOTE — Anesthesia Preprocedure Evaluation (Signed)
Anesthesia Evaluation  Patient identified by MRN, date of birth, ID band Patient confused and Patient unresponsive    Reviewed: Allergy & Precautions, NPO status , Patient's Chart, lab work & pertinent test results, Unable to perform ROS - Chart review onlyPreop documentation limited or incomplete due to emergent nature of procedure.  History of Anesthesia Complications Negative for: history of anesthetic complications  Airway Mallampati: Unable to assess  TM Distance: >3 FB Neck ROM: Limited    Dental  (+) Missing   Pulmonary neg pulmonary ROS   breath sounds clear to auscultation       Cardiovascular hypertension, Pt. on medications and Pt. on home beta blockers pulmonary hypertension+ CAD (2007 60% LAD) and + Peripheral Vascular Disease  + dysrhythmias Atrial Fibrillation  Rhythm:Irregular Rate:Tachycardia + Systolic murmurs '21 ECHO: NORMAL LEFT VENTRICULAR SYSTOLIC FUNCTION  NORMAL RIGHT VENTRICULAR SYSTOLIC FUNCTION  MODERATE VALVULAR REGURGITATION (See above)  NO VALVULAR STENOSIS  MODERATE to SEVERE TR  MILD AR, MR  EF 50-55%     Neuro/Psych Seizures -,  acute onset of altered mental status with somnolence while talking to her son over the phone and confusion around 5 PM: 1. Positive Large Vessel Occlusion: Occluded Left ICA in the neck (about 2.5 cm from the origin), with occluded Left ICA siphon, terminus, and occluded Left MCA.  CVA (acute CVA: aphasic, not responsive to commands)    GI/Hepatic Neg liver ROS, hiatal hernia,GERD  Medicated,,  Endo/Other  Hypothyroidism    Renal/GU Renal InsufficiencyRenal disease     Musculoskeletal  (+) Arthritis ,    Abdominal   Peds  Hematology coumadin   Anesthesia Other Findings Arrived emergently on Cardizem for Afib rate control  Reproductive/Obstetrics                              Anesthesia Physical Anesthesia Plan  ASA: 4 and  emergent  Anesthesia Plan: General   Post-op Pain Management: Minimal or no pain anticipated   Induction: Intravenous and Rapid sequence  PONV Risk Score and Plan: 3 and Ondansetron, Dexamethasone and Treatment may vary due to age or medical condition  Airway Management Planned: Oral ETT  Additional Equipment: Arterial line  Intra-op Plan:   Post-operative Plan: Possible Post-op intubation/ventilation  Informed Consent:      Only emergency history available and History available from chart only  Plan Discussed with: CRNA and Surgeon  Anesthesia Plan Comments:          Anesthesia Quick Evaluation

## 2022-11-12 NOTE — Progress Notes (Addendum)
STROKE TEAM PROGRESS NOTE   BRIEF HPI Ms.  Elizabeth Fields is an 89 y.o. female with a PMHx of CKD, atrial fibrillation on warfarin, aortic valve insuffiency, CAD, CKD stage IIIb, HFpEF and chronic venous stasis dermatitis with ulcers who presented to the [facility] ED on Sunday evening with acute onset of altered mental status with somnolence which had occurred abruptly while talking to her relative over the phone. LKN was 5 PM on Sunday. Her relative called her neighbor who brought her to the emergency room.  The patient was nonverbal on arrival and was having left gaze preference. CTA showed occluded left ICA and MCA. CTP showed core infarct volume between 33-63ml. NIH after CTP was 35. CODE IR activated and patient was transferred to [facility].  Patient was recently discharged from [facility] for acute blood loss anemia secondary to supratherapeutic INR  6.6 with right leg bleeding. She was discharged off warfarin d/t anemia and bleeding.   SIGNIFICANT HOSPITAL EVENTS 11/4 AM: IR procedure: Status post complete revascularization of the occluded left internal carotid terminus, the left MCA and the left ACA achieving TICI 3 revascularization.  Started on Heparin gtt  INTERIM HISTORY/SUBJECTIVE  Nephew at bedside, updated on assessment and plan of care.  Patent admitted to 4N this AM after IR procedure. On initial exam, sedation and paralytics still in effect. Subsequent exam showed movement of RUE only, not following commands.    OBJECTIVE  CBC    Component Value Date/Time   WBC 9.6 11/12/2022 0201   RBC 3.38 (L) 11/12/2022 0201   HGB 10.4 (L) 11/12/2022 0201   HGB 12.5 02/03/2020 1557   HCT 33.3 (L) 11/12/2022 0201   HCT 36.6 02/03/2020 1557   PLT 403 (H) 11/12/2022 0201   PLT 232 02/03/2020 1557   MCV 98.5 11/12/2022 0201   MCV 96 02/03/2020 1557   MCH 30.8 11/12/2022 0201   MCHC 31.2 11/12/2022 0201   RDW 17.1 (H) 11/12/2022 0201   RDW 13.1 02/03/2020 1557   LYMPHSABS 0.9 11/11/2022 1842   MONOABS  0.9 11/11/2022 1842   EOSABS 0.1 11/11/2022 1842   BASOSABS 0.1 11/11/2022 1842    BMET    Component Value Date/Time   NA 135 11/12/2022 0201   NA 140 02/03/2020 1557   K 4.6 11/12/2022 0201   CL 105 11/12/2022 0201   CO2 20 (L) 11/12/2022 0201   GLUCOSE 92 11/12/2022 0201   BUN 26 (H) 11/12/2022 0201   BUN 23 02/03/2020 1557   CREATININE 0.94 11/12/2022 0201   CREATININE 1.13 09/13/2011 0951   CALCIUM 8.1 (L) 11/12/2022 0201   GFRNONAA 58 (L) 11/12/2022 0201   GFRNONAA 47 (L) 09/13/2011 0951    IMAGING past 24 hours CT CEREBRAL PERFUSION W CONTRAST  Result Date: 11/12/2022 CLINICAL DATA:  89 year old female with occluded cervical left ICA, left ICA siphon, left MCA. Patchy left MCA infarct on MRI 0033 hours today. EXAM: CT PERFUSION BRAIN TECHNIQUE: Multiphase CT imaging of the brain was performed following IV bolus contrast injection. Subsequent parametric perfusion maps were calculated using RAPID software. RADIATION DOSE REDUCTION: This exam was performed according to the departmental dose-optimization program which includes automated exposure control, adjustment of the mA and/or kV according to patient size and/or use of iterative reconstruction technique. CONTRAST:  40mL OMNIPAQUE IOHEXOL 350 MG/ML SOLN COMPARISON:  Brain MRI 0033 hours and CTA head and neck 0414 hours today. FINDINGS: ASPECTS: 9 (abnormal left insula 2059 hours yesterday). CBF (<30%) Volume: 16mL, with range up to  33 mL (CBF less than 38%). Only 5 mL of abnormal CBV detected. Perfusion (Tmax>6.0s) volume: , hypoperfusion index of 0.5 (T-max greater than 10s volume estimated at 77 mL). Mismatch Volume: Up to , minimum mismatch estimated at 78 mL. Infarction Location:Left MCA IMPRESSION: Positive for mismatch of Left MCA oligemia (estimated 155 mL) and core infarct volume (estimated between 33 and 77 mL when considering both CBF and Tmax >10s). Hypoperfusion index of 0.5. Electronically Signed   By: Odessa Fleming  M.D.   On: 11/12/2022 05:44   CT ANGIO HEAD NECK W WO CM  Result Date: 11/12/2022 CLINICAL DATA:  89 year old female with altered mental status. Left MCA infarct by MRI and abnormal left ICA flow void. EXAM: CT ANGIOGRAPHY HEAD AND NECK TECHNIQUE: Multidetector CT imaging of the head and neck was performed using the standard protocol during bolus administration of intravenous contrast. Multiplanar CT image reconstructions and MIPs were obtained to evaluate the vascular anatomy. Carotid stenosis measurements (when applicable) are obtained utilizing NASCET criteria, using the distal internal carotid diameter as the denominator. RADIATION DOSE REDUCTION: This exam was performed according to the departmental dose-optimization program which includes automated exposure control, adjustment of the mA and/or kV according to patient size and/or use of iterative reconstruction technique. CONTRAST:  75mL OMNIPAQUE IOHEXOL 350 MG/ML SOLN COMPARISON:  Brain MRI 0037 hours today.  No prior CTA. FINDINGS: CTA NECK Skeleton: Cervical spine degeneration with spondylolisthesis and some dextroconvex scoliosis. No acute osseous abnormality identified. Upper chest: Layering pleural effusions with simple fluid density, at least moderate. Upper lobe pulmonary septal thickening and patchy asymmetric ground-glass opacity. No superior mediastinal lymphadenopathy identified. Other neck: Negative. Aortic arch: 3 vessel arch.  Calcified aortic atherosclerosis. Right carotid system: Brachiocephalic artery origin not entirely included, but the brachiocephalic and right CCA origin appear normal. Minor calcified plaque at the right carotid bifurcation. No right carotid stenosis to the skull base. Left carotid system: Normal left CCA origin. Mildly tortuous proximal left CCA. Calcified plaque at the left ICA origin which is less than 50 % stenosis with respect to the distal vessel. Occluded left ICA in the neck, with fading contrast enhancement  beginning at the bulb. Absent left ICA enhancement about 2.5 cm from the origin. No reconstitution to the skull base. Vertebral arteries: Proximal right subclavian artery and right vertebral artery origin are patent with no significant plaque or stenosis. Right vertebral remains patent to the skull base with no significant plaque or stenosis. Proximal left subclavian artery soft and calcified plaque without significant stenosis. Left vertebral artery origin is normal. Tortuous left V1 segment. Left vertebral artery is codominant and patent to the skull base with no significant stenosis. CTA HEAD Posterior circulation: Codominant distal vertebral arteries and vertebrobasilar junction, PICA origins are patent with no significant plaque or stenosis. Patent basilar artery without stenosis. Patent SCA and PCA origins. Posterior communicating arteries are diminutive or absent. Bilateral PCA branches are within normal limits. Anterior circulation: Left ICA siphon and terminus are occluded. Reconstituted left ACA A1 enhancement. Left MCA is occluded. Poor appearance of left MCA collaterals. Contralateral right ICA siphon is patent with mild to moderate right siphon calcified plaque. Only mild right siphon stenosis in the supraclinoid segment. Patent right ICA terminus, right MCA and ACA origins. Normal anterior communicating artery. Patent and fairly symmetric bilateral ACA branches, mild motion artifact at the distal ACAs. Right MCA M1 and trifurcation are patent without stenosis. Right MCA branches are within normal limits. Venous sinuses: Early contrast timing, not  evaluated. Anatomic variants: None. Review of the MIP images confirms the above findings IMPRESSION: 1. Positive Large Vessel Occlusion: Occluded Left ICA in the neck (about 2.5 cm from the origin), with occluded Left ICA siphon, terminus, and occluded Left MCA. 2. Reconstituted Left ACA via the anterior communicating artery. Mild for age other atherosclerosis  in the head and neck with no other hemodynamically significant stenosis. Aortic Atherosclerosis (ICD10-I70.0). 3. Evidence of Acute Pulmonary Edema and at least moderate layering pleural effusions in the upper chest. #1 discussed by telephone with Dr. Valente David on 11/12/2022 at 04:32 . Electronically Signed   By: Odessa Fleming M.D.   On: 11/12/2022 04:35   MR BRAIN WO CONTRAST  Result Date: 11/12/2022 CLINICAL DATA:  Initial evaluation for altered mental status. EXAM: MRI HEAD WITHOUT CONTRAST TECHNIQUE: Multiplanar, multiecho pulse sequences of the brain and surrounding structures were obtained without intravenous contrast. COMPARISON:  Prior CT from earlier the same day. FINDINGS: Brain: Examination moderately degraded by motion artifact. Generalized age-related cerebral atrophy. Mild chronic microvascular ischemic disease noted involving the periventricular and deep white matter both cerebral hemispheres. Patchy and confluent restricted diffusion involving the left basal ganglia, corona radiata, and left insula, consistent with an acute left MCA distribution infarct. No associated hemorrhage or mass effect. No other evidence for acute or subacute ischemia. No acute intracranial hemorrhage. No mass lesion or mass effect. No hydrocephalus. Small subdural collections overlying the bilateral cerebral convexities measure up to 2 mm bilaterally, likely small chronic bilateral subdural hematomas. No acute blood products seen on prior head CT. No significant mass effect or midline shift. Pituitary gland and suprasellar region within normal limits. Vascular: Loss of normal flow void within the left ICA to the terminus, which could reflect slow flow and/or occlusion. Major intracranial vascular flow voids are otherwise grossly maintained on this motion degraded exam. Skull and upper cervical spine: Craniocervical junction within normal limits. Bone marrow signal intensity normal. No scalp soft tissue abnormality.  Sinuses/Orbits: Prior bilateral ocular lens replacement. Paranasal sinuses are largely clear. No significant mastoid effusion. Other: None. IMPRESSION: 1. Acute left MCA distribution infarct involving the left basal ganglia, corona radiata, and insula. No associated hemorrhage or mass effect. 2. Loss of normal flow void within the left ICA to the terminus, which could reflect slow flow and/or occlusion. 3. Small subdural collections overlying the bilateral cerebral convexities measuring up to 2 mm bilaterally, likely small chronic bilateral subdural hematomas. No acute blood products seen on prior head CT. No significant mass effect or midline shift. 4. Underlying age-related cerebral atrophy with mild chronic small vessel ischemic disease. Results were communicated by telephone at the time of interpretation on 11/12/2022 at 12:14 am to provider Dr. Loleta Rose. Electronically Signed   By: Rise Mu M.D.   On: 11/12/2022 00:18   DG Abd Portable 1V  Result Date: 11/11/2022 CLINICAL DATA:  Altered mental status, pre MRI metal screening EXAM: PORTABLE ABDOMEN - 1 VIEW COMPARISON:  None Available. FINDINGS: No unexpected metallic foreign body identified within the abdomen and pelvis save for multiple metallic buttons overlying the patient. Normal abdominal gas pattern. No gross free intraperitoneal gas. Cholecystectomy clips are seen in the right upper quadrant. No organomegaly. Advanced vascular calcifications. Left hip bipolar hemiarthroplasty and right hip ORIF noted. Advanced degenerative changes are seen within the lumbar spine with compression deformities of T12, L1, and L2, not well profiled on this examination. IMPRESSION: 1. No unexpected metallic foreign body identified within the abdomen and pelvis. 2.  Advanced vascular calcifications. 3. Advanced degenerative changes within the lumbar spine with compression deformities of T12, L1, and L2, not well profiled on this examination. Electronically  Signed   By: Helyn Numbers M.D.   On: 11/11/2022 23:07   CT Head Wo Contrast  Result Date: 11/11/2022 CLINICAL DATA:  Mental status change EXAM: CT HEAD WITHOUT CONTRAST TECHNIQUE: Contiguous axial images were obtained from the base of the skull through the vertex without intravenous contrast. RADIATION DOSE REDUCTION: This exam was performed according to the departmental dose-optimization program which includes automated exposure control, adjustment of the mA and/or kV according to patient size and/or use of iterative reconstruction technique. COMPARISON:  CT brain 09/06/2021 FINDINGS: Brain: No acute territorial infarction, hemorrhage or intracranial mass. Moderate atrophy. Mild chronic small vessel ischemic changes the white matter. Stable ventricle size. Vascular: No hyperdense vessels.  Carotid vascular calcification. Skull: Normal. Negative for fracture or focal lesion. Sinuses/Orbits: No acute finding. Other: None. IMPRESSION: 1. No CT evidence for acute intracranial abnormality. 2. Atrophy and chronic small vessel ischemic changes of the white matter. Electronically Signed   By: Jasmine Pang M.D.   On: 11/11/2022 20:07   DG Chest Port 1 View  Result Date: 11/11/2022 CLINICAL DATA:  Altered mental status, possible sepsis EXAM: PORTABLE CHEST 1 VIEW COMPARISON:  11/02/2022 FINDINGS: Lungs are clear.  No pleural effusion or pneumothorax. Cardiomegaly. IMPRESSION: Cardiomegaly. No acute cardiopulmonary disease. Electronically Signed   By: Charline Bills M.D.   On: 11/11/2022 19:53    There were no vitals filed for this visit.   PHYSICAL EXAM General: Frail elderly cachectic malnourished looking Caucasian lady in no acute distress Psych:  Mood and affect appropriate for situation CV: Regular rate and rhythm on monitor Respiratory:  Regular, unlabored respirations on room air GI: Abdomen soft and nontender   NEURO:  Mental Status: Intubated, no sedation.  Globally aphasic and does not  follow commands.  Does not open eyes, . Spontaneous, non-purposeful movement of LUE seen.  Withdraws left lower extremity.  Dense right hemiplegia with hypotonia  ASSESSMENT/PLAN  Acute Ischemic Infarct:  left MCA territory s/p mechanical thrombectomy Etiology:  likely embolic secondary to Atrial Fibrillation and stopping coumadin d/t bleeding and anemia  Code Stroke CT head: No acute abnormality. Small vessel disease. Atrophy.   CTA head & neck  Positive Large Vessel Occlusion: Occluded Left ICA in the neck (about 2.5 cm from the origin), with occluded Left ICA siphon, terminus, and occluded Left MCA. CT perfusion  Positive for mismatch of Left MCA oligemia (estimated 155 mL) and core infarct volume (estimated between 33 and 77 mL when considering both CBF and Tmax >10s). Hypoperfusion index of 0.5. Post IR CT: no evidence of intracranial hemorrhage.  MRI:  Acute left MCA distribution infarct involving the left basal ganglia, corona radiata, and insula. No associated hemorrhage or mass effect.  Loss of normal flow void within the left ICA to the terminus, which could reflect slow flow and/or occlusion 11/4 repeat MRI: PENDING  2D Echo: PENDING LDL 51 HgbA1c No results found for requested labs within last 1095 days. VTE prophylaxis - SCDs No antithrombotic (was recently taken off warfarin prior to admission due to anemia and bleeding), now on heparin IV  Therapy recommendations:  Pending Disposition:  pending  Acute Pulmonary Edema Hx of COPD Remains intubated post-procedure Wean to SBT as tolerated CCM assistance appreciated Patient is a DNR, intubated for procedures only  Atrial fibrillation Home Meds: warfarin (recently taken off d/t bleeding  and anemia) Continue telemetry monitoring Begin anticoagulation with Heparin gtt   Hypertension CHF CAD Home meds:  Toprol-XL, lasix 20mg , spironolactone 25mg  Stable Blood Pressure Goal: SBP 120-160 for first 24 hours then less than  180   Hyperlipidemia Home meds:  simvastatin 20mg , resume once able to take PO LDL 51, goal < 70 Continue statin at discharge  Dysphagia Patient has post-stroke dysphagia, SLP consulted    Diet   Diet NPO time specified   Diet NPO time specified   Advance diet as tolerated  Other Stroke Risk Factors Advanced age  Other Active Problems CKD IIIa GFR at baseline BUN 26, Creatinine 0.94 Hypothyroid Synthroid home med Hx of [facility]  Hospital day # 0  Pt seen by Neuro NP/APP and later by MD. Note/plan to be edited by MD as needed.    Lynnae January, DNP, AGACNP-BC [facility] Please use AMION for contact information & EPIC for messaging.  ADDENDUM: Patient extubated at 1530, one way extubation.   STROKE MD NOTE :  I have personally obtained history,examined this patient, reviewed notes, independently viewed imaging studies, participated in medical decision making and plan of care.ROS completed by me personally and pertinent positives fully documented  I have made any additions or clarifications directly to the above note. Agree with note above.  Patient with chronic atrial fibrillation on warfarin with recent supratherapeutic INR with leg bleeding which was reversed with vitamin K.  Patient presented with sudden onset of aphasia and right hemiplegia.  Initial MRI showed left basal ganglia infarct but CT angiogram showed left carotid occlusion with estimated core infarct of 33 to 77 mL with a large penumbra.  MRI scan showed left basal ganglia infarct with diffusion flair mismatch and patient was transferred for emergent mechanical thrombectomy after discussion of this finding with patient's nephew who is an power of attorney.  Mechanical thrombectomy procedure was successful with TICI 3 revascularization and patient has been extubated but remains globally aphasic with dense right hemiplegia.  Prognosis is guarded given her advanced age and limited brain reserve.  Continue  close neurological monitoring and strict blood pressure control as per post thrombectomy protocol.  Start IV heparin for atrial fibrillation.  Management of pulmonary edema and elevated as per critical care team.  Long discussion with patient's nephew at the bedside and with Dr. Vassie Loll critical care medicine about the condition and answered questions.This patient is critically ill and at significant risk of neurological worsening, death and care requires constant monitoring of vital signs, hemodynamics,respiratory and cardiac monitoring, extensive review of multiple databases, frequent neurological assessment, discussion with family, other specialists and medical decision making of high complexity.I have made any additions or clarifications directly to the above note.This critical care time does not reflect procedure time, or teaching time or supervisory time of PA/NP/Med Resident etc but could involve care discussion time.  I spent 50 minutes of neurocritical care time  in the care of  this patient.      Delia Heady, MD Medical Director [facility] Pager: [phone] 11/12/2022 4:45 PM  To contact Stroke Continuity provider, please refer to [email or web address]. After hours, contact General Neurology

## 2022-11-12 NOTE — Progress Notes (Signed)
PHARMACY - ANTICOAGULATION CONSULT NOTE  Pharmacy Consult:  Heparin Indication: atrial fibrillation  Allergies  Allergen Reactions   Fosamax [Alendronate Sodium] Nausea And Vomiting   Other Rash   Penicillins Nausea And Vomiting and Palpitations    Patient Measurements:   Heparin Dosing Weight: 38 kg  Vital Signs: Temp: 97.6 F (36.4 C) (11/04 0447) Temp Source: Oral (11/04 0447) BP: 124/69 (11/04 1000) Pulse Rate: 95 (11/04 1221)  Labs: Recent Labs    11/11/22 1815 11/11/22 1842 11/11/22 1842 11/11/22 2026 11/12/22 0201 11/12/22 1058  HGB  --  9.7*   < >  --  10.4* 10.2*  HCT  --  31.7*  --   --  33.3* 30.0*  PLT  --  368  --   --  403*  --   APTT 24  --   --   --   --   --   LABPROT 15.3*  --   --   --   --   --   INR 1.2  --   --   --   --   --   CREATININE  --   --   --  1.11* 0.94  --    < > = values in this interval not displayed.    Estimated Creatinine Clearance: 24.3 mL/min (by C-G formula based on SCr of 0.94 mg/dL).   Medical History: Past Medical History:  Diagnosis Date   Actinic keratosis    Actinic keratosis 11/02/2019   left calf, bx proven   Allergy    Atrial fibrillation (HCC)    CHF (congestive heart failure) (HCC)    History of squamous cell carcinoma    Hyperlipidemia    Hypertension    Renal insufficiency    Squamous cell carcinoma of skin 02/25/2017   Right cheek. KA type.   Thyroid disease      Assessment: 52 YOF presented with an acute CVA s/p revascularization on 11/4.  Patient has a history of Afib recently came off of Coumadin due to elevated INR.  INR sub-therapeutic at 1.2.  Pharmacy consulted to dose IV heparin per stroke protocol.  CBC stable.  Goal of Therapy:  Heparin level 0.3-0.5 units/ml Monitor platelets by anticoagulation protocol: Yes   Plan: No bolus Heparin gtt at 400 units/hr Check 8 hr heparin level  Daily heparin level and CBC  Mala Gibbard D. Laney Potash, PharmD, BCPS, BCCCP 11/12/2022, 12:35 PM

## 2022-11-12 NOTE — Anesthesia Procedure Notes (Signed)
Procedure Name: Intubation Date/Time: 11/12/2022 7:17 AM  Performed by: Einar Grad, CRNAPre-anesthesia Checklist: Patient identified, Emergency Drugs available, Suction available and Patient being monitored Patient Re-evaluated:Patient Re-evaluated prior to induction Oxygen Delivery Method: Circle System Utilized Preoxygenation: Pre-oxygenation with 100% oxygen Induction Type: IV induction Ventilation: Mask ventilation without difficulty Laryngoscope Size: Mac and 3 Grade View: Grade II Tube type: Oral Tube size: 6.5 mm Number of attempts: 1 Airway Equipment and Method: Stylet and Oral airway Placement Confirmation: ETT inserted through vocal cords under direct vision, positive ETCO2 and breath sounds checked- equal and bilateral Secured at: 22 cm Tube secured with: Tape Dental Injury: Teeth and Oropharynx as per pre-operative assessment

## 2022-11-12 NOTE — ED Provider Notes (Signed)
-----------------------------------------   12:20 AM on 11/12/2022 -----------------------------------------  Received phone call from Dr. Phill Myron with radiology.  He informed me about the positive acute CVA found on this patient's MRI brain.  I asked him about additional imaging that may be recommended and if there was concern for LVO, and he felt that based on the location of the CVA, LVO was very unlikely.  Due to an ICA occlusion, he recommended possibly ordering an MRA head for further evaluation.  He felt that unlikely that the patient would have an intervenable lesion or issue that would require transfer.  The patient is on the hospitalist service, so I found Dr. Arville Care and spoke with him in person, passing along the information as relayed to me by radiology.  From what I can tell on the documention, Last Known Normal is unknown.   Loleta Rose, MD 11/12/22 [phone]

## 2022-11-12 NOTE — Progress Notes (Signed)
RT assisted with transport of this pt from 4N to MRI and back while on full ventilatory support. Pt tolerated well with SVS and no complications.

## 2022-11-12 NOTE — Progress Notes (Signed)
PT Cancellation Note  Patient Details Name: Elizabeth Fields MRN: [id number] DOB: [birth year]   Cancelled Treatment:    Reason Eval/Treat Not Completed: Patient not medically ready - intubated, sedated, will check back at a later time.   Marye Round, PT DPT Acute Rehabilitation Services Secure Chat Preferred  Office [phone]    Truddie Coco 11/12/2022, 2:33 PM

## 2022-11-12 NOTE — Procedures (Signed)
INR.  Status post left common carotid arteriogram.  Right CFA approach.  Findings   1.  Occluded distal left internal carotid artery terminus, left middle cerebral artery M1 segment, and left anterior cerebral artery A1 segment.  Status post complete revascularization of the occluded left internal carotid terminus, the left MCA and the left ACA with 1 pass with a 4 mm x 40 mm Solitaire X retriever  device Iand contact aspiration achieving aTICI  3 revascularization.  Post CT brain no evidence of intracranial hemorrhage.  8 French Angio-Seal closure device deployed for hemostasis at the right groin puncture site.  Distal pulses all present bilaterally unchanged from prior to procedure.  Patient left intubated due to her medical condition.    Fatima Sanger MD.

## 2022-11-12 NOTE — Sedation Documentation (Signed)
Handoff given to Carolyne Fiscal RN. Groin site assessed, site C/D/I. Soft to palpation, no hematoma noted, and distal pulses intact or via doppler. Pt remained intubated and unable to assess neurological status.

## 2022-11-12 NOTE — Progress Notes (Signed)
Pt transported via [facility] to [facility].  All VS stable at time of transport.  All questions answered.  Tentative report called to 4NICU charge RN to accept pt after procedure.  No pt belongings brought to bedside.

## 2022-11-12 NOTE — H&P (Signed)
Admission H&P    Chief Complaint: Altered mental status  HPI: [place] Fields is an 89 y.o. female with a PMHx of CKD, atrial fibrillation on warfarin, aortic valve insuffiency, CAD, CKD stage IIIb, HFpEF and chronic venous stasis dermatitis with ulcers who presented to the [facility] ED on Sunday evening with acute onset of altered mental status with somnolence which had occurred abruptly while talking to her relative over the phone. LKN was 5 PM on Sunday. Her relative called her neighbor who brought her to the emergency room.  The patient was nonverbal on arrival and was having left gaze preference. HR was 119 on arrival with otherwise normal vital signs.  Labs revealed mild hyponatremia 133 and a CO2 of 20 with a BUN of 25 and creatinine 1.11 with albumin 2.9 and AST 67 with CBC showing anemia.  UA was negative. EKG showed atrial fibrillation with rapid ventricular sponsor 121.   Head CT scan revealed atrophy and chronic small vessel ischemic changes of the white matter with no acute intracranial normality.  MRI brain at 2349 revealed an acute left MCA distribution infarct involving the left basal ganglia, corona radiata, and insula. DWI FLAIR mismatch was present. There was loss of normal flow void within the left ICA to the terminus, suggestive of slow flow and/or occlusion. Small subdural collections overlying the bilateral cerebral convexities measuring up to 2 mm bilaterally were also noted, felt most likely to represent small chronic bilateral subdural hematomas, with no significant mass effect or midline shift. Underlying age-related cerebral atrophy with mild chronic small vessel ischemic disease was also noted.  CTA of head and neck was then obtained, revealing an occluded Left ICA in the neck (about 2.5 cm from the origin), with occluded Left ICA siphon, terminus, and occluded Left MCA. Reconstituted Left ACA via the anterior communicating artery was noted.  Neurology was called and after evaluation  of the above imaging findings, emergent thrombectomy was anticipated. CTP was ordered to confirm salvageable penumbra, revealing mismatch of Left MCA oligemia (estimated 155 mL) and core infarct volume (estimated between 33 and 77 mL when considering both CBF and Tmax >10s), with hypoperfusion index of 0.5.  NIHSS per communication with team at [facility] was 35 after CTP was obtained.   The patient's condition was discussed over the telephone with her nephew, who is her next of kin. She has no disability at baseline. Risks/benefits of thrombectomy versus no treatment were discussed at length, with nephew expressing understanding and consent to proceed with mechanical thrombectomy.   Code IR was called and emergent transfer to [facility] for IR was initiated.   Of note, she was recently discharged from [facility] on 10/31 with acute blood loss anemia in the setting of a supratherapeutic INR of 6.6 with right leg bleeding. Warfarin was held and vitamin K was administered. She was discharged off warfarin due to her anemia and bleeding.   Past Medical History:  Diagnosis Date   Actinic keratosis    Actinic keratosis 11/02/2019   left calf, bx proven   Allergy    Atrial fibrillation (HCC)    CHF (congestive heart failure) (HCC)    History of squamous cell carcinoma    Hyperlipidemia    Hypertension    Renal insufficiency    Squamous cell carcinoma of skin 02/25/2017   Right cheek. KA type.   Thyroid disease     Past Surgical History:  Procedure Laterality Date   ANTERIOR APPROACH HEMI HIP ARTHROPLASTY Left 02/12/2019   Procedure:  ANTERIOR APPROACH HEMI HIP ARTHROPLASTY;  Surgeon: Kennedy Bucker, MD;  Location: [facility] ORS;  Service: Orthopedics;  Laterality: Left;   CHOLECYSTECTOMY     HIP SURGERY      Family History  Problem Relation Age of Onset   Breast cancer Mother 26   Heart disease Father    Social History:  reports that she has never smoked. She has been exposed to tobacco smoke. She has never  used smokeless tobacco. She reports that she does not drink alcohol and does not use drugs.  Allergies:  Allergies  Allergen Reactions   Fosamax [Alendronate Sodium] Nausea And Vomiting   Other Rash   Penicillins Nausea And Vomiting and Palpitations    Medications Prior to Admission  Medication Sig Dispense Refill   acetaminophen (TYLENOL) 500 MG tablet Take 1-2 tablets (500-1,000 mg total) by mouth every 8 (eight) hours as needed for mild pain or moderate pain. 30 tablet 0   Cholecalciferol 50 MCG (2000 UT) CAPS Take 2,000 Units by mouth daily.     esomeprazole (NEXIUM) 40 MG capsule Take 40 mg by mouth daily at 12 noon.     feeding supplement, ENSURE ENLIVE, (ENSURE ENLIVE) LIQD Take 237 mLs by mouth 2 (two) times daily between meals. 237 mL 12   ferrous fumarate-b12-vitamic C-folic acid (TRINSICON / FOLTRIN) capsule Take 1 capsule by mouth 2 (two) times daily. 30 capsule 0   furosemide (LASIX) 20 MG tablet Take 1 tablet (20 mg total) by mouth daily. 30 tablet 0   levothyroxine (SYNTHROID, LEVOTHROID) 100 MCG tablet Take 100 mcg by mouth daily before breakfast.     magnesium oxide (MAG-OX) 400 MG tablet Take 400 mg by mouth daily.     methocarbamol (ROBAXIN) 500 MG tablet Take 500 mg by mouth every 6 (six) hours as needed for muscle spasms.     metoprolol succinate (TOPROL-XL) 50 MG 24 hr tablet Take 1 tablet (50 mg total) by mouth daily. Take with or immediately following a meal.     simvastatin (ZOCOR) 20 MG tablet Take 20 mg by mouth daily.     spironolactone (ALDACTONE) 25 MG tablet Take 25 mg by mouth daily.     traMADol (ULTRAM) 50 MG tablet Take 50 mg by mouth every 6 (six) hours as needed.     triamcinolone ointment (KENALOG) 0.1 % Apply 1 Application topically 2 (two) times daily as needed (Rash). To affected areas at legs. Avoid applying to face, groin, and axilla. Use as directed. Long-term use can cause thinning of the skin. 454 g 1    ROS: Unable to obtain due to  obtundation.    Physical Examination: There were no vitals taken for this visit.  Physical Exam  General: Frail-appearing elderly female  HEENT-  [place]/AT    Lungs- Respirations unlabored Extremities- No edema  Neurological Examination Mental Status: Obtunded. Unable to follow any commands. Will move left side and grimace to noxious stimuli. Nonverbal.   Cranial Nerves: II: No blink to threat bilaterally. PERRL  III,IV, VI: Eyes are closed and do not open to stimuli. When lids are elevated, eyes are conjugate and at the midline. No nystagmus. No oculocephalic reflex elicitable.   V: Reacts to brow ridge pressure on the left but not on the right.   VII: Right facial droop when grimacing to noxious VIII: Not responding to voice IX,X: Gag reflex deferred XI: Unable to assess XII: Not following commands for assessment Motor/Sensory: Not following commands for formal testing.  LUE with movement  to noxious including light swatting antigravity movements. Drifts to bed after passive elevation and release.  LLE withdraws to noxious RUE with increased tone and no movement to any stimuli. Drops immediately to bed after passive elevation and release.  RLE with minimal movement to noxious stimulation.  Deep Tendon Reflexes: 2+ bilateral brachioradialis Cerebellar: Unable to assess Gait: Unable to assess  NIHSS: 34  Results for orders placed or performed during the hospital encounter of 11/11/22 (from the past 48 hour(s))  Protime-INR     Status: Abnormal   Collection Time: 11/11/22  6:15 PM  Result Value Ref Range   Prothrombin Time 15.3 (H) 11.4 - 15.2 seconds   INR 1.2 0.8 - 1.2    Comment: (NOTE) INR goal varies based on device and disease states. Performed at [facility], [address] [place], [place] [address]   APTT     Status: None   Collection Time: 11/11/22  6:15 PM  Result Value Ref Range   aPTT 24 24 - 36 seconds    Comment: Performed at [facility], [address] [place], [place] [address]  Lactic acid, plasma     Status: None   Collection Time: 11/11/22  6:42 PM  Result Value Ref Range   Lactic Acid, Venous 1.8 0.5 - 1.9 mmol/L    Comment: Performed at [facility], [address] [place], [place] [address]  Blood Culture (routine x 2)     Status: None (Preliminary result)   Collection Time: 11/11/22  6:42 PM   Specimen: Right Antecubital; Blood  Result Value Ref Range   Specimen Description RIGHT ANTECUBITAL    Special Requests      BOTTLES DRAWN AEROBIC AND ANAEROBIC Blood Culture results may not be optimal due to an inadequate volume of blood received in culture bottles   Culture      NO GROWTH < 12 HOURS Performed at [facility], [address] [place], [place] [address]    Report Status PENDING   CBC with Differential/Platelet     Status: Abnormal   Collection Time: 11/11/22  6:42 PM  Result Value Ref Range   WBC 7.5 4.0 - 10.5 K/uL   RBC 3.08 (L) 3.87 - 5.11 MIL/uL   Hemoglobin 9.7 (L) 12.0 - 15.0 g/dL   HCT 57.8 (L) 46.9 - 62.9 %   MCV 102.9 (H) 80.0 - 100.0 fL   MCH 31.5 26.0 - 34.0 pg   MCHC 30.6 30.0 - 36.0 g/dL   RDW 52.8 (H) 41.3 - 24.4 %   Platelets 368 150 - 400 K/uL   nRBC 0.0 0.0 - 0.2 %   Neutrophils Relative % 73 %   Neutro Abs 5.5 1.7 - 7.7 K/uL   Lymphocytes Relative 12 %   Lymphs Abs 0.9 0.7 - 4.0 K/uL   Monocytes Relative 12 %   Monocytes Absolute 0.9 0.1 - 1.0 K/uL   Eosinophils Relative 1 %   Eosinophils Absolute 0.1 0.0 - 0.5 K/uL   Basophils Relative 1 %   Basophils Absolute 0.1 0.0 - 0.1 K/uL   Immature Granulocytes 1 %   Abs Immature Granulocytes 0.07 0.00 - 0.07 K/uL    Comment: Performed at [facility], [address] [place], [place] [address]  Blood Culture (routine x 2)     Status: None (Preliminary result)   Collection Time: 11/11/22  6:48 PM   Specimen: Right Antecubital; Blood  Result Value Ref Range   Specimen Description  RIGHT  ANTECUBITAL    Special Requests      BOTTLES DRAWN AEROBIC AND ANAEROBIC Blood Culture adequate volume   Culture      NO GROWTH < 12 HOURS Performed at [facility], [address] [place], [place] [address]    Report Status PENDING   Urinalysis, w/ Reflex to Culture (Infection Suspected) -Urine, Clean Catch     Status: Abnormal   Collection Time: 11/11/22  7:42 PM  Result Value Ref Range   Specimen Source URINE, CLEAN CATCH    Color, Urine YELLOW (A) YELLOW   APPearance CLEAR (A) CLEAR   Specific Gravity, Urine 1.012 1.005 - 1.030   pH 5.0 5.0 - 8.0   Glucose, UA NEGATIVE NEGATIVE mg/dL   Hgb urine dipstick NEGATIVE NEGATIVE   Bilirubin Urine NEGATIVE NEGATIVE   Ketones, ur NEGATIVE NEGATIVE mg/dL   Protein, ur NEGATIVE NEGATIVE mg/dL   Nitrite NEGATIVE NEGATIVE   Leukocytes,Ua NEGATIVE NEGATIVE   RBC / HPF 0-5 0 - 5 RBC/hpf   WBC, UA 0-5 0 - 5 WBC/hpf    Comment:        Reflex urine culture not performed if WBC <=10, OR if Squamous epithelial cells >5. If Squamous epithelial cells >5 suggest recollection.    Bacteria, UA NONE SEEN NONE SEEN   Squamous Epithelial / HPF 0-5 0 - 5 /HPF   Mucus PRESENT    Hyaline Casts, UA PRESENT     Comment: Performed at [facility], [address] [place], [place] [address]  Comprehensive metabolic panel     Status: Abnormal   Collection Time: 11/11/22  8:26 PM  Result Value Ref Range   Sodium 133 (L) 135 - 145 mmol/L   Potassium 4.6 3.5 - 5.1 mmol/L   Chloride 104 98 - 111 mmol/L   CO2 20 (L) 22 - 32 mmol/L   Glucose, Bld 92 70 - 99 mg/dL    Comment: Glucose reference range applies only to samples taken after fasting for at least 8 hours.   BUN 25 (H) 8 - 23 mg/dL   Creatinine, Ser 0.98 (H) 0.44 - 1.00 mg/dL   Calcium 8.1 (L) 8.9 - 10.3 mg/dL   Total Protein 6.6 6.5 - 8.1 g/dL   Albumin 2.9 (L) 3.5 - 5.0 g/dL   AST 67 (H) 15 - 41 U/L   ALT 35 0 - 44 U/L   Alkaline Phosphatase 115 38 - 126 U/L   Total  Bilirubin 1.0 0.3 - 1.2 mg/dL   GFR, Estimated 48 (L) >60 mL/min    Comment: (NOTE) Calculated using the CKD-EPI Creatinine Equation (2021)    Anion gap 9 5 - 15    Comment: Performed at [facility], [address], [place], [place] [address]  Basic metabolic panel     Status: Abnormal   Collection Time: 11/12/22  2:01 AM  Result Value Ref Range   Sodium 135 135 - 145 mmol/L   Potassium 4.6 3.5 - 5.1 mmol/L   Chloride 105 98 - 111 mmol/L   CO2 20 (L) 22 - 32 mmol/L   Glucose, Bld 92 70 - 99 mg/dL    Comment: Glucose reference range applies only to samples taken after fasting for at least 8 hours.   BUN 26 (H) 8 - 23 mg/dL   Creatinine, Ser 7.82 0.44 - 1.00 mg/dL   Calcium 8.1 (L) 8.9 - 10.3 mg/dL   GFR, Estimated 58 (L) >60 mL/min    Comment: (NOTE)  Calculated using the CKD-EPI Creatinine Equation (2021)    Anion gap 10 5 - 15    Comment: Performed at [facility], [address] [place], [place] [address]  CBC     Status: Abnormal   Collection Time: 11/12/22  2:01 AM  Result Value Ref Range   WBC 9.6 4.0 - 10.5 K/uL   RBC 3.38 (L) 3.87 - 5.11 MIL/uL   Hemoglobin 10.4 (L) 12.0 - 15.0 g/dL   HCT 25.9 (L) 56.3 - 87.5 %   MCV 98.5 80.0 - 100.0 fL   MCH 30.8 26.0 - 34.0 pg   MCHC 31.2 30.0 - 36.0 g/dL   RDW 64.3 (H) 32.9 - 51.8 %   Platelets 403 (H) 150 - 400 K/uL   nRBC 0.0 0.0 - 0.2 %    Comment: Performed at [facility] [address] [place], [place] [address]  Lipid panel     Status: None   Collection Time: 11/12/22  4:48 AM  Result Value Ref Range   Cholesterol 121 0 - 200 mg/dL   Triglycerides 55 <063 mg/dL   HDL 59 >01 mg/dL   Total CHOL/HDL Ratio 2.1 RATIO   VLDL 11 0 - 40 mg/dL   LDL Cholesterol 51 0 - 99 mg/dL    Comment:        Total Cholesterol/HDL:CHD Risk Coronary Heart Disease Risk Table                     Men   Women  1/2 Average Risk   3.4   3.3  Average Risk       5.0   4.4  2 X Average Risk   9.6   7.1  3 X  Average Risk  23.4   11.0        Use the calculated Patient Ratio above and the CHD Risk Table to determine the patient's CHD Risk.        ATP III CLASSIFICATION (LDL):  <100     mg/dL   Optimal  601-093  mg/dL   Near or Above                    Optimal  130-159  mg/dL   Borderline  235-573  mg/dL   High  >220     mg/dL   Very High Performed at [facility], [address] [place], [place] [address]    CT CEREBRAL PERFUSION W CONTRAST  Result Date: 11/12/2022 CLINICAL DATA:  89 year old female with occluded cervical left ICA, left ICA siphon, left MCA. Patchy left MCA infarct on MRI 0033 hours today. EXAM: CT PERFUSION BRAIN TECHNIQUE: Multiphase CT imaging of the brain was performed following IV bolus contrast injection. Subsequent parametric perfusion maps were calculated using RAPID software. RADIATION DOSE REDUCTION: This exam was performed according to the departmental dose-optimization program which includes automated exposure control, adjustment of the mA and/or kV according to patient size and/or use of iterative reconstruction technique. CONTRAST:  40mL OMNIPAQUE IOHEXOL 350 MG/ML SOLN COMPARISON:  Brain MRI 0033 hours and CTA head and neck 0414 hours today. FINDINGS: ASPECTS: 9 (abnormal left insula 2059 hours yesterday). CBF (<30%) Volume: 16mL, with range up to 33 mL (CBF less than 38%). Only 5 mL of abnormal CBV detected. Perfusion (Tmax>6.0s) volume: , hypoperfusion index of 0.5 (T-max greater than 10s volume estimated at 77 mL). Mismatch Volume: Up to , minimum mismatch estimated at 78 mL. Infarction Location:Left MCA IMPRESSION:  Positive for mismatch of Left MCA oligemia (estimated 155 mL) and core infarct volume (estimated between 33 and 77 mL when considering both CBF and Tmax >10s). Hypoperfusion index of 0.5. Electronically Signed   By: Odessa Fleming M.D.   On: 11/12/2022 05:44   CT ANGIO HEAD NECK W WO CM  Result Date: 11/12/2022 CLINICAL DATA:  89 year old  female with altered mental status. Left MCA infarct by MRI and abnormal left ICA flow void. EXAM: CT ANGIOGRAPHY HEAD AND NECK TECHNIQUE: Multidetector CT imaging of the head and neck was performed using the standard protocol during bolus administration of intravenous contrast. Multiplanar CT image reconstructions and MIPs were obtained to evaluate the vascular anatomy. Carotid stenosis measurements (when applicable) are obtained utilizing NASCET criteria, using the distal internal carotid diameter as the denominator. RADIATION DOSE REDUCTION: This exam was performed according to the departmental dose-optimization program which includes automated exposure control, adjustment of the mA and/or kV according to patient size and/or use of iterative reconstruction technique. CONTRAST:  75mL OMNIPAQUE IOHEXOL 350 MG/ML SOLN COMPARISON:  Brain MRI 0037 hours today.  No prior CTA. FINDINGS: CTA NECK Skeleton: Cervical spine degeneration with spondylolisthesis and some dextroconvex scoliosis. No acute osseous abnormality identified. Upper chest: Layering pleural effusions with simple fluid density, at least moderate. Upper lobe pulmonary septal thickening and patchy asymmetric ground-glass opacity. No superior mediastinal lymphadenopathy identified. Other neck: Negative. Aortic arch: 3 vessel arch.  Calcified aortic atherosclerosis. Right carotid system: Brachiocephalic artery origin not entirely included, but the brachiocephalic and right CCA origin appear normal. Minor calcified plaque at the right carotid bifurcation. No right carotid stenosis to the skull base. Left carotid system: Normal left CCA origin. Mildly tortuous proximal left CCA. Calcified plaque at the left ICA origin which is less than 50 % stenosis with respect to the distal vessel. Occluded left ICA in the neck, with fading contrast enhancement beginning at the bulb. Absent left ICA enhancement about 2.5 cm from the origin. No reconstitution to the skull  base. Vertebral arteries: Proximal right subclavian artery and right vertebral artery origin are patent with no significant plaque or stenosis. Right vertebral remains patent to the skull base with no significant plaque or stenosis. Proximal left subclavian artery soft and calcified plaque without significant stenosis. Left vertebral artery origin is normal. Tortuous left V1 segment. Left vertebral artery is codominant and patent to the skull base with no significant stenosis. CTA HEAD Posterior circulation: Codominant distal vertebral arteries and vertebrobasilar junction, PICA origins are patent with no significant plaque or stenosis. Patent basilar artery without stenosis. Patent SCA and PCA origins. Posterior communicating arteries are diminutive or absent. Bilateral PCA branches are within normal limits. Anterior circulation: Left ICA siphon and terminus are occluded. Reconstituted left ACA A1 enhancement. Left MCA is occluded. Poor appearance of left MCA collaterals. Contralateral right ICA siphon is patent with mild to moderate right siphon calcified plaque. Only mild right siphon stenosis in the supraclinoid segment. Patent right ICA terminus, right MCA and ACA origins. Normal anterior communicating artery. Patent and fairly symmetric bilateral ACA branches, mild motion artifact at the distal ACAs. Right MCA M1 and trifurcation are patent without stenosis. Right MCA branches are within normal limits. Venous sinuses: Early contrast timing, not evaluated. Anatomic variants: None. Review of the MIP images confirms the above findings IMPRESSION: 1. Positive Large Vessel Occlusion: Occluded Left ICA in the neck (about 2.5 cm from the origin), with occluded Left ICA siphon, terminus, and occluded Left MCA. 2. Reconstituted Left ACA  via the anterior communicating artery. Mild for age other atherosclerosis in the head and neck with no other hemodynamically significant stenosis. Aortic Atherosclerosis (ICD10-I70.0).  3. Evidence of Acute Pulmonary Edema and at least moderate layering pleural effusions in the upper chest. #1 discussed by telephone with Dr. Valente David on 11/12/2022 at 04:32 . Electronically Signed   By: Odessa Fleming M.D.   On: 11/12/2022 04:35   MR BRAIN WO CONTRAST  Result Date: 11/12/2022 CLINICAL DATA:  Initial evaluation for altered mental status. EXAM: MRI HEAD WITHOUT CONTRAST TECHNIQUE: Multiplanar, multiecho pulse sequences of the brain and surrounding structures were obtained without intravenous contrast. COMPARISON:  Prior CT from earlier the same day. FINDINGS: Brain: Examination moderately degraded by motion artifact. Generalized age-related cerebral atrophy. Mild chronic microvascular ischemic disease noted involving the periventricular and deep white matter both cerebral hemispheres. Patchy and confluent restricted diffusion involving the left basal ganglia, corona radiata, and left insula, consistent with an acute left MCA distribution infarct. No associated hemorrhage or mass effect. No other evidence for acute or subacute ischemia. No acute intracranial hemorrhage. No mass lesion or mass effect. No hydrocephalus. Small subdural collections overlying the bilateral cerebral convexities measure up to 2 mm bilaterally, likely small chronic bilateral subdural hematomas. No acute blood products seen on prior head CT. No significant mass effect or midline shift. Pituitary gland and suprasellar region within normal limits. Vascular: Loss of normal flow void within the left ICA to the terminus, which could reflect slow flow and/or occlusion. Major intracranial vascular flow voids are otherwise grossly maintained on this motion degraded exam. Skull and upper cervical spine: Craniocervical junction within normal limits. Bone marrow signal intensity normal. No scalp soft tissue abnormality. Sinuses/Orbits: Prior bilateral ocular lens replacement. Paranasal sinuses are largely clear. No significant mastoid  effusion. Other: None. IMPRESSION: 1. Acute left MCA distribution infarct involving the left basal ganglia, corona radiata, and insula. No associated hemorrhage or mass effect. 2. Loss of normal flow void within the left ICA to the terminus, which could reflect slow flow and/or occlusion. 3. Small subdural collections overlying the bilateral cerebral convexities measuring up to 2 mm bilaterally, likely small chronic bilateral subdural hematomas. No acute blood products seen on prior head CT. No significant mass effect or midline shift. 4. Underlying age-related cerebral atrophy with mild chronic small vessel ischemic disease. Results were communicated by telephone at the time of interpretation on 11/12/2022 at 12:14 am to provider Dr. Loleta Rose. Electronically Signed   By: Rise Mu M.D.   On: 11/12/2022 00:18   DG Abd Portable 1V  Result Date: 11/11/2022 CLINICAL DATA:  Altered mental status, pre MRI metal screening EXAM: PORTABLE ABDOMEN - 1 VIEW COMPARISON:  None Available. FINDINGS: No unexpected metallic foreign body identified within the abdomen and pelvis save for multiple metallic buttons overlying the patient. Normal abdominal gas pattern. No gross free intraperitoneal gas. Cholecystectomy clips are seen in the right upper quadrant. No organomegaly. Advanced vascular calcifications. Left hip bipolar hemiarthroplasty and right hip ORIF noted. Advanced degenerative changes are seen within the lumbar spine with compression deformities of T12, L1, and L2, not well profiled on this examination. IMPRESSION: 1. No unexpected metallic foreign body identified within the abdomen and pelvis. 2. Advanced vascular calcifications. 3. Advanced degenerative changes within the lumbar spine with compression deformities of T12, L1, and L2, not well profiled on this examination. Electronically Signed   By: Helyn Numbers M.D.   On: 11/11/2022 23:07   CT Head Wo Contrast  Result Date: 11/11/2022 CLINICAL  DATA:  Mental status change EXAM: CT HEAD WITHOUT CONTRAST TECHNIQUE: Contiguous axial images were obtained from the base of the skull through the vertex without intravenous contrast. RADIATION DOSE REDUCTION: This exam was performed according to the departmental dose-optimization program which includes automated exposure control, adjustment of the mA and/or kV according to patient size and/or use of iterative reconstruction technique. COMPARISON:  CT brain 09/06/2021 FINDINGS: Brain: No acute territorial infarction, hemorrhage or intracranial mass. Moderate atrophy. Mild chronic small vessel ischemic changes the white matter. Stable ventricle size. Vascular: No hyperdense vessels.  Carotid vascular calcification. Skull: Normal. Negative for fracture or focal lesion. Sinuses/Orbits: No acute finding. Other: None. IMPRESSION: 1. No CT evidence for acute intracranial abnormality. 2. Atrophy and chronic small vessel ischemic changes of the white matter. Electronically Signed   By: Jasmine Pang M.D.   On: 11/11/2022 20:07   DG Chest Port 1 View  Result Date: 11/11/2022 CLINICAL DATA:  Altered mental status, possible sepsis EXAM: PORTABLE CHEST 1 VIEW COMPARISON:  11/02/2022 FINDINGS: Lungs are clear.  No pleural effusion or pneumothorax. Cardiomegaly. IMPRESSION: Cardiomegaly. No acute cardiopulmonary disease. Electronically Signed   By: Charline Bills M.D.   On: 11/11/2022 19:53     Assessment: 89 year old female presenting in transfer from [facility] as  - Exam reveals an obtunded patient with right hemiplegia. NIHSS 34.  Imaging findings as per HPI - EKG: Atrial fibrillation; LAD, consider LAFB or inferior infarct; Consider anterior infarct; ST depression, probably rate related; Prolonged QT interval - The patient is not a candidate for TNK due to anticoagulation and time criteria.  - The patient is a VIR candidate. Risks/benefits of the procedure were discussed extensively with the patient's nephew by  telephone [facility], [place], [phone]), including approximately 50% chance of significant improvement relative to an approximate 10% chance of subarachnoid hemorrhage with possibility of significant worsening including death. The patient's nephew expressed understanding and provided informed consent to proceed with VIR. All questions answered.      Plan:  - Admitting to Neuro ICU following VIR.  - Post-VIR order set to include frequent neuro checks and BP management.  - No antiplatelet medications or anticoagulants for at least 24 hours following VIR unless otherwise indicated if stenting is required.  - Warfarin was stopped during prior admission to [facility] in October due to bleeding complications in the setting of supratherapeutic INR. Consider starting apixaban after 24 hours following IR if repeat CT at that time is negative for hemorrhagic conversion.   - DVT prophylaxis with SCDs.  - Will need to be started on a statin.  - TTE.  - Repeat MRI brain after VIR.  - PT/OT/Speech.  - NPO until passes swallow evaluation.  - Glycemic control.  - Telemetry monitoring - Fasting lipid panel, HgbA1c - CCM has been consulted for vent management if she remains intubated after IR, as well as for the pulmonary edema seen on CTA (see addendum below).  - Stroke Team has been notified of her pending admission to the floor after IR. - Nephew states that she is to be DNR after extubation following IR (can be intubated only for procedures).     Addendum: CTA also reveals evidence of Acute Pulmonary Edema and at least moderate layering pleural effusions in the upper chest. Will need CCM input regarding possible additional diagnostic testing and management.   40 minutes spent in the neurological evaluation and management of this critically ill patient  Electronically signed: Dr.  Caryl Pina 11/12/2022, 7:11 AM

## 2022-11-12 NOTE — Procedures (Signed)
Extubation Procedure Note  Patient Details:   Name: ADRIENA MANFRE DOB: 1931/07/08 MRN: [id number]   Airway Documentation:    Vent end date: 11/12/22 Vent end time: 1530   Evaluation  O2 sats: stable throughout Complications: No apparent complications Patient did tolerate procedure well. Bilateral Breath Sounds: Diminished, Clear   No  Pt had cuff leak prior to extubation. Placed on 2L [place]  Idelle Leech 11/12/2022, 3:42 PM

## 2022-11-12 NOTE — Assessment & Plan Note (Signed)
-   We will continue therapy and check fasting lipids.

## 2022-11-12 NOTE — Assessment & Plan Note (Addendum)
-   This is currently controlled. - We will continue beta-blocker therapy. - We will continue IV Cardizem drip while monitoring blood pressure. - The patient has been on Coumadin in the past and it was held due to bleeding from venous stasis ulcers.

## 2022-11-12 NOTE — Transfer of Care (Signed)
Immediate Anesthesia Transfer of Care Note  Patient: Elizabeth Fields  Procedure(s) Performed: RADIOLOGY WITH ANESTHESIA  Patient Location: ICU  Anesthesia Type:General  Level of Consciousness: Patient remains intubated per anesthesia plan  Airway & Oxygen Therapy: Patient remains intubated per anesthesia plan and Patient placed on Ventilator (see vital sign flow sheet for setting)  Post-op Assessment: Report given to RN and Post -op Vital signs reviewed and stable  Post vital signs: Reviewed and stable  Last Vitals:  Vitals Value Taken Time  BP 111/60 11/12/22 0829  Temp    Pulse 70 11/12/22 0836  Resp 18 11/12/22 0836  SpO2 100 % 11/12/22 0836  Vitals shown include unfiled device data.  Last Pain: There were no vitals filed for this visit.       Complications: No notable events documented.

## 2022-11-12 NOTE — Sedation Documentation (Signed)
Patient will be under anesthesia care, including all medication and hemodynamic documentation. Foley catheter will be placed by nursing staff.

## 2022-11-12 NOTE — Progress Notes (Signed)
  Echocardiogram 2D Echocardiogram has been performed.  Elizabeth Fields 11/12/2022, 2:19 PM

## 2022-11-12 NOTE — Anesthesia Procedure Notes (Signed)
Arterial Line Insertion Start/End11/04/2022 7:27 AM Performed by: Jairo Ben, MD, Noah Delaine, CRNA, anesthesiologist  Patient location: OR. Preanesthetic checklist: patient identified, IV checked, site marked, risks and benefits discussed, surgical consent, monitors and equipment checked, pre-op evaluation, timeout performed and anesthesia consent radial was placed Catheter size: 20 G Hand hygiene performed  and maximum sterile barriers used   Attempts: 1 Procedure performed without using ultrasound guided technique. Following insertion, dressing applied. Post procedure assessment: normal and unchanged

## 2022-11-12 NOTE — Assessment & Plan Note (Signed)
-   We will continue Synthroid. 

## 2022-11-12 NOTE — Progress Notes (Signed)
PHARMACY - ANTICOAGULATION CONSULT NOTE  Pharmacy Consult:  Heparin Indication: atrial fibrillation  Allergies  Allergen Reactions   Fosamax [Alendronate Sodium] Nausea And Vomiting   Other Rash   Penicillins Nausea And Vomiting and Palpitations    Patient Measurements:   Heparin Dosing Weight: 38 kg  Vital Signs: Temp: 97.2 F (36.2 C) (11/04 2000) Temp Source: Oral (11/04 2000) BP: 105/66 (11/04 2300) Pulse Rate: 101 (11/04 2300)  Labs: Recent Labs    11/11/22 1815 11/11/22 1842 11/11/22 1842 11/11/22 2026 11/12/22 0201 11/12/22 1058 11/12/22 2233  HGB  --  9.7*   < >  --  10.4* 10.2*  --   HCT  --  31.7*  --   --  33.3* 30.0*  --   PLT  --  368  --   --  403*  --   --   APTT 24  --   --   --   --   --   --   LABPROT 15.3*  --   --   --   --   --   --   INR 1.2  --   --   --   --   --   --   HEPARINUNFRC  --   --   --   --   --   --  0.10*  CREATININE  --   --   --  1.11* 0.94  --   --    < > = values in this interval not displayed.    Estimated Creatinine Clearance: 24.3 mL/min (by C-G formula based on SCr of 0.94 mg/dL).   Medical History: Past Medical History:  Diagnosis Date   Actinic keratosis    Actinic keratosis 11/02/2019   left calf, bx proven   Allergy    Atrial fibrillation (HCC)    CHF (congestive heart failure) (HCC)    History of squamous cell carcinoma    Hyperlipidemia    Hypertension    Renal insufficiency    Squamous cell carcinoma of skin 02/25/2017   Right cheek. KA type.   Thyroid disease      Assessment: 64 YOF presented with an acute CVA s/p revascularization on 11/4.  Patient has a history of Afib recently came off of Coumadin due to elevated INR.  INR sub-therapeutic at 1.2.  Pharmacy consulted to dose IV heparin per stroke protocol.  CBC stable.  11/4 PM update:  Heparin level sub-therapeutic   Goal of Therapy:  Heparin level 0.3-0.5 units/ml Monitor platelets by anticoagulation protocol: Yes   Plan: Inc heparin  to 500 units/hr Re-check heparin level in 8 hours  Abran Duke, PharmD, BCPS Clinical Pharmacist Phone: [phone]

## 2022-11-12 NOTE — H&P (Addendum)
[facility]   PATIENT NAME: Elizabeth Fields    MR#:  [id number]  DATE OF BIRTH:  06/29/1931  DATE OF ADMISSION:  11/11/2022  PRIMARY CARE PHYSICIAN: Gracelyn Nurse, MD   Patient is coming from: Home  REQUESTING/REFERRING PHYSICIAN: Alfonse Flavors, MD  CHIEF COMPLAINT:   Chief Complaint  Patient presents with   Wound Infection    HISTORY OF PRESENT ILLNESS:  Elizabeth Fields is a 89 y.o. female with medical history significant for chronic venous stasis dermatitis with ulcers, atrial fibrillation on warfarin, aortic valve insufficiency, CAD, CKD stage IIIb, HFpEF, who presents to the ED with acute onset of altered mental status with somnolence while talking to her son over the phone and confusion around 5 PM.  Her son called her neighbor who brought her to the emergency room.  The patient was nonverbal during my interview and was having left gaze preference. No reported fever or chills.  No reported chest pain or palpitations.  No reported cough or wheezing or dyspnea.  ED Course: When she came to the ER, heart rate was 119 with otherwise normal vital signs.  Labs revealed mild hyponatremia 133 and a CO2 of 20 with a BUN of 25 and creatinine 1.11 with albumin 2.9 and AST 67 with CBC showing anemia.  UA was been negative. EKG as reviewed by me :  EKG showed atrial fibrillation with rapid ventricular sponsor 121. Imaging: Noncontrast head CT scan revealed atrophy and chronic small vessel ischemic changes of the white matter with no acute intracranial normality.  Two-view chest x-ray showed cardiomegaly with no acute cardiopulmonary disease. Portable abdomen x-ray showed degenerative changes within the lumbar spine with compression deformities of T12, L1, and L2, advanced vascular calcification with no other acute abnormality.  Brain MRI without contrast revealed the following: 1. Acute left MCA distribution infarct involving the left basal ganglia, corona radiata, and insula. No  associated hemorrhage or mass effect. 2. Loss of normal flow void within the left ICA to the terminus, which could reflect slow flow and/or occlusion. 3. Small subdural collections overlying the bilateral cerebral convexities measuring up to 2 mm bilaterally, likely small chronic bilateral subdural hematomas. No acute blood products seen on prior head CT. No significant mass effect or midline shift. 4. Underlying age-related cerebral atrophy with mild chronic small vessel ischemic disease.  The patient will be admitted to a progressive unit observation bed for further evaluation and management. PAST MEDICAL HISTORY:   Past Medical History:  Diagnosis Date   Actinic keratosis    Actinic keratosis 11/02/2019   left calf, bx proven   Allergy    Atrial fibrillation (HCC)    CHF (congestive heart failure) (HCC)    History of squamous cell carcinoma    Hyperlipidemia    Hypertension    Renal insufficiency    Squamous cell carcinoma of skin 02/25/2017   Right cheek. KA type.   Thyroid disease     PAST SURGICAL HISTORY:   Past Surgical History:  Procedure Laterality Date   ANTERIOR APPROACH HEMI HIP ARTHROPLASTY Left 02/12/2019   Procedure: ANTERIOR APPROACH HEMI HIP ARTHROPLASTY;  Surgeon: Kennedy Bucker, MD;  Location: [facility] ORS;  Service: Orthopedics;  Laterality: Left;   CHOLECYSTECTOMY     HIP SURGERY      SOCIAL HISTORY:   Social History   Tobacco Use   Smoking status: Never    Passive exposure: Past   Smokeless tobacco: Never  Substance Use Topics  Alcohol use: No    FAMILY HISTORY:   Family History  Problem Relation Age of Onset   Breast cancer Mother 77   Heart disease Father     DRUG ALLERGIES:   Allergies  Allergen Reactions   Fosamax [Alendronate Sodium] Nausea And Vomiting   Other Rash   Penicillins Nausea And Vomiting and Palpitations    REVIEW OF SYSTEMS:   ROS As per history of present illness. All pertinent systems were reviewed above.  Constitutional, HEENT, cardiovascular, respiratory, GI, GU, musculoskeletal, neuro, psychiatric, endocrine, integumentary and hematologic systems were reviewed and are otherwise negative/unremarkable except for positive findings mentioned above in the HPI.   MEDICATIONS AT HOME:   Prior to Admission medications   Medication Sig Start Date End Date Taking? Authorizing Provider  acetaminophen (TYLENOL) 500 MG tablet Take 1-2 tablets (500-1,000 mg total) by mouth every 8 (eight) hours as needed for mild pain or moderate pain. 02/14/19  Yes Evon Slack, PA-C  Cholecalciferol 50 MCG (2000 UT) CAPS Take 2,000 Units by mouth daily.   Yes [provider]  esomeprazole (NEXIUM) 40 MG capsule Take 40 mg by mouth daily at 12 noon.   Yes [provider]  feeding supplement, ENSURE ENLIVE, (ENSURE ENLIVE) LIQD Take 237 mLs by mouth 2 (two) times daily between meals. 02/18/19  Yes Esaw Grandchild A, DO  ferrous fumarate-b12-vitamic C-folic acid (TRINSICON / FOLTRIN) capsule Take 1 capsule by mouth 2 (two) times daily. 11/08/22  Yes Marrion Coy, MD  furosemide (LASIX) 20 MG tablet Take 1 tablet (20 mg total) by mouth daily. 02/19/19  Yes Esaw Grandchild A, DO  levothyroxine (SYNTHROID, LEVOTHROID) 100 MCG tablet Take 100 mcg by mouth daily before breakfast.   Yes [provider]  magnesium oxide (MAG-OX) 400 MG tablet Take 400 mg by mouth daily.   Yes [provider]  methocarbamol (ROBAXIN) 500 MG tablet Take 500 mg by mouth every 6 (six) hours as needed for muscle spasms.   Yes [provider]  metoprolol succinate (TOPROL-XL) 50 MG 24 hr tablet Take 1 tablet (50 mg total) by mouth daily. Take with or immediately following a meal. 11/08/22  Yes Marrion Coy, MD  simvastatin (ZOCOR) 20 MG tablet Take 20 mg by mouth daily.   Yes [provider]  spironolactone (ALDACTONE) 25 MG tablet Take 25 mg by mouth daily. 08/21/22  Yes [provider]   traMADol (ULTRAM) 50 MG tablet Take 50 mg by mouth every 6 (six) hours as needed. 07/27/19  Yes [provider]  triamcinolone ointment (KENALOG) 0.1 % Apply 1 Application topically 2 (two) times daily as needed (Rash). To affected areas at legs. Avoid applying to face, groin, and axilla. Use as directed. Long-term use can cause thinning of the skin. 10/30/22  Yes Elie Goody, MD      VITAL SIGNS:  Blood pressure (!) 123/58, pulse 92, temperature 98.9 F (37.2 C), temperature source Rectal, resp. rate (!) 21, height 4\' 11"  (1.499 m), weight 38 kg, SpO2 99%.  PHYSICAL EXAMINATION:  Physical Exam  GENERAL:  89 y.o.-year-old  female patient lying in the bed with no acute distress.  EYES: Pupils equal, round, reactive to light and accommodation. No scleral icterus. Extraocular muscles intact.  HEENT: Head atraumatic, normocephalic. Oropharynx and nasopharynx clear.  NECK:  Supple, no jugular venous distention. No thyroid enlargement, no tenderness.  LUNGS: Normal breath sounds bilaterally, no wheezing, rales,rhonchi or crepitation. No use of accessory muscles of respiration.  CARDIOVASCULAR: Regular rate  and rhythm, S1, S2 normal. No murmurs, rubs, or gallops.  ABDOMEN: Soft, nondistended, nontender. Bowel sounds present. No organomegaly or mass.  EXTREMITIES: No pedal edema, cyanosis, or clubbing.  NEUROLOGIC: Cranial nerves II through XII reveal aphasia with left gaze preference.  She was mainly moving her left upper and lower extremities and would not follow commands.  Gait not checked.  PSYCHIATRIC: The patient is alert and oriented x 3.  Normal affect and good eye contact. SKIN: No obvious rash, lesion, or ulcer.   LABORATORY PANEL:   CBC Recent Labs  Lab 11/12/22 0201  WBC 9.6  HGB 10.4*  HCT 33.3*  PLT 403*   ------------------------------------------------------------------------------------------------------------------  Chemistries  Recent Labs  Lab  11/11/22 2026 11/12/22 0201  NA 133* 135  K 4.6 4.6  CL 104 105  CO2 20* 20*  GLUCOSE 92 92  BUN 25* 26*  CREATININE 1.11* 0.94  CALCIUM 8.1* 8.1*  AST 67*  --   ALT 35  --   ALKPHOS 115  --   BILITOT 1.0  --    ------------------------------------------------------------------------------------------------------------------  Cardiac Enzymes No results for input(s): "TROPONINI" in the last 168 hours. ------------------------------------------------------------------------------------------------------------------  RADIOLOGY:  MR BRAIN WO CONTRAST  Result Date: 11/12/2022 CLINICAL DATA:  Initial evaluation for altered mental status. EXAM: MRI HEAD WITHOUT CONTRAST TECHNIQUE: Multiplanar, multiecho pulse sequences of the brain and surrounding structures were obtained without intravenous contrast. COMPARISON:  Prior CT from earlier the same day. FINDINGS: Brain: Examination moderately degraded by motion artifact. Generalized age-related cerebral atrophy. Mild chronic microvascular ischemic disease noted involving the periventricular and deep white matter both cerebral hemispheres. Patchy and confluent restricted diffusion involving the left basal ganglia, corona radiata, and left insula, consistent with an acute left MCA distribution infarct. No associated hemorrhage or mass effect. No other evidence for acute or subacute ischemia. No acute intracranial hemorrhage. No mass lesion or mass effect. No hydrocephalus. Small subdural collections overlying the bilateral cerebral convexities measure up to 2 mm bilaterally, likely small chronic bilateral subdural hematomas. No acute blood products seen on prior head CT. No significant mass effect or midline shift. Pituitary gland and suprasellar region within normal limits. Vascular: Loss of normal flow void within the left ICA to the terminus, which could reflect slow flow and/or occlusion. Major intracranial vascular flow voids are otherwise grossly  maintained on this motion degraded exam. Skull and upper cervical spine: Craniocervical junction within normal limits. Bone marrow signal intensity normal. No scalp soft tissue abnormality. Sinuses/Orbits: Prior bilateral ocular lens replacement. Paranasal sinuses are largely clear. No significant mastoid effusion. Other: None. IMPRESSION: 1. Acute left MCA distribution infarct involving the left basal ganglia, corona radiata, and insula. No associated hemorrhage or mass effect. 2. Loss of normal flow void within the left ICA to the terminus, which could reflect slow flow and/or occlusion. 3. Small subdural collections overlying the bilateral cerebral convexities measuring up to 2 mm bilaterally, likely small chronic bilateral subdural hematomas. No acute blood products seen on prior head CT. No significant mass effect or midline shift. 4. Underlying age-related cerebral atrophy with mild chronic small vessel ischemic disease. Results were communicated by telephone at the time of interpretation on 11/12/2022 at 12:14 am to provider Dr. Loleta Rose. Electronically Signed   By: Rise Mu M.D.   On: 11/12/2022 00:18   DG Abd Portable 1V  Result Date: 11/11/2022 CLINICAL DATA:  Altered mental status, pre MRI metal screening EXAM: PORTABLE ABDOMEN - 1 VIEW COMPARISON:  None Available. FINDINGS: No  unexpected metallic foreign body identified within the abdomen and pelvis save for multiple metallic buttons overlying the patient. Normal abdominal gas pattern. No gross free intraperitoneal gas. Cholecystectomy clips are seen in the right upper quadrant. No organomegaly. Advanced vascular calcifications. Left hip bipolar hemiarthroplasty and right hip ORIF noted. Advanced degenerative changes are seen within the lumbar spine with compression deformities of T12, L1, and L2, not well profiled on this examination. IMPRESSION: 1. No unexpected metallic foreign body identified within the abdomen and pelvis. 2.  Advanced vascular calcifications. 3. Advanced degenerative changes within the lumbar spine with compression deformities of T12, L1, and L2, not well profiled on this examination. Electronically Signed   By: Helyn Numbers M.D.   On: 11/11/2022 23:07   CT Head Wo Contrast  Result Date: 11/11/2022 CLINICAL DATA:  Mental status change EXAM: CT HEAD WITHOUT CONTRAST TECHNIQUE: Contiguous axial images were obtained from the base of the skull through the vertex without intravenous contrast. RADIATION DOSE REDUCTION: This exam was performed according to the departmental dose-optimization program which includes automated exposure control, adjustment of the mA and/or kV according to patient size and/or use of iterative reconstruction technique. COMPARISON:  CT brain 09/06/2021 FINDINGS: Brain: No acute territorial infarction, hemorrhage or intracranial mass. Moderate atrophy. Mild chronic small vessel ischemic changes the white matter. Stable ventricle size. Vascular: No hyperdense vessels.  Carotid vascular calcification. Skull: Normal. Negative for fracture or focal lesion. Sinuses/Orbits: No acute finding. Other: None. IMPRESSION: 1. No CT evidence for acute intracranial abnormality. 2. Atrophy and chronic small vessel ischemic changes of the white matter. Electronically Signed   By: Jasmine Pang M.D.   On: 11/11/2022 20:07   DG Chest Port 1 View  Result Date: 11/11/2022 CLINICAL DATA:  Altered mental status, possible sepsis EXAM: PORTABLE CHEST 1 VIEW COMPARISON:  11/02/2022 FINDINGS: Lungs are clear.  No pleural effusion or pneumothorax. Cardiomegaly. IMPRESSION: Cardiomegaly. No acute cardiopulmonary disease. Electronically Signed   By: Charline Bills M.D.   On: 11/11/2022 19:53      IMPRESSION AND PLAN:  Assessment and Plan: * Acute CVA (cerebrovascular accident) (HCC) - This is manifested by left acute MCA territory infarction. - The patient has subsequent aphasia and right-sided hemiparesis with  left gaze preference as well as acute encephalopathy. - The patient will be admitted to an observation progressive unit bed.   - We will follow neuro checks q.4 hours for 24 hours.   - The patient will be placed on aspirin.   - Will obtain a CT of the head and neck and 2D echo with bubble study .   - A neurology consultation  as well as physical/occupation/speech therapy consults will be obtained in a.m.Marland Kitchen - I notified Dr. Amada Jupiter about the patient. - The patient will be placed on statin therapy and fasting lipids will be checked.    Chronic atrial fibrillation with RVR (HCC) - This is currently controlled. - We will continue beta-blocker therapy. - We will continue IV Cardizem drip while monitoring blood pressure. - The patient has been on Coumadin in the past and it was held due to bleeding from venous stasis ulcers.  Dyslipidemia - We will continue therapy and check fasting lipids.  Essential hypertension - We will continue antihypertensive therapy with permissive parameters.  Hypothyroidism - We will continue Synthroid.   DVT prophylaxis: Lovenox.  Advanced Care Planning:  Code Status: She DNR and DNI. Family Communication:  The plan of care was discussed in details with the patient (and family).  I answered all questions. The patient agreed to proceed with the above mentioned plan. Further management will depend upon hospital course. Disposition Plan: Back to previous home environment Consults called: Neurology All the records are reviewed and case discussed with ED provider.  Status is: Observation  I certify that at the time of admission, it is my clinical judgment that the patient will require hospital care extending less than 2 midnights.                            Dispo: The patient is from: Home              Anticipated d/c is to: Home              Patient currently is not medically stable to d/c.              Difficult to place patient: No  Hannah Beat M.D on  11/12/2022 at 4:00 AM  [facility]   From 7 PM-7 AM, contact night-coverage [email or web address]  CC: Primary care physician; Gracelyn Nurse, MD

## 2022-11-12 NOTE — Assessment & Plan Note (Addendum)
-   We will continue antihypertensive therapy with permissive parameters.

## 2022-11-12 NOTE — Assessment & Plan Note (Addendum)
-   This is manifested by left acute MCA territory infarction. - The patient has subsequent aphasia and right-sided hemiparesis with left gaze preference as well as acute encephalopathy. - The patient will be admitted to an observation progressive unit bed.   - We will follow neuro checks q.4 hours for 24 hours.   - The patient will be placed on aspirin.   - Will obtain a CT of the head and neck and 2D echo with bubble study .   - A neurology consultation  as well as physical/occupation/speech therapy consults will be obtained in a.m.Marland Kitchen - I notified Dr. Amada Jupiter about the patient. - The patient will be placed on statin therapy and fasting lipids will be checked.

## 2022-11-12 NOTE — Progress Notes (Signed)
       CROSS COVER NOTE  NAME: Elizabeth Fields MRN: [id number] DOB : [birth year]    Date of Service   11/12/2022   HPI/Events of Note   Called by admitting physician and Neurologist to expedite pt's imaging as she is a candidate for thrombectomy for a large vessel occlusion. Code stroke called and pt brought down to CT for perfusion scan.  Interventions   EMTALA, Medical necessity and discharge summary completed. Report given to [facility] ED MD. Care link en route to transfer pt to [facility] at this time. Pt's nephew was called and updated on plan of care and he was in agreement.        Desta Bujak Lamin Geradine Girt, MSN, APRN, AGACNP-BC [facility] [place] Pager: [phone]. Check Amion for Availability

## 2022-11-12 NOTE — Consult Note (Signed)
NAME:  Elizabeth Fields, MRN:  [id number], DOB:  1931/05/09, LOS: 0 ADMISSION DATE:  11/12/2022, CONSULTATION DATE:  11/12/2022 REFERRING MD:  Dr. Otelia Limes - Stroke, CHIEF COMPLAINT: Vent management  History of Present Illness:  Elizabeth Fields is a 89 year old female with a past medical history significant for COPD, atrial fibrillation anticoagulated with Coumadin, aortic valve insufficiency, CKD stage IIIb, HFpEF, and chronic venous stasis dermatitis who presented to [facility] ED 11/3 with acute onset of AMS and somnolence, last known normal 5 PM on Sunday.   She had acute onset mental status changes and dysarthria while speaking to her nephew/HCPOA.  Patient was transferred to [facility] and admitted per neurology as code stroke.  MRI brain revealed acute left MCA infarct with a concern for an acute LVO.  Patient was seen by neurointerventional radiology -she underwent right CFA approach angiogram followed by revascularization of occluded distal left ICA, left MCA and left ACA segments.  Postprocedure CT brain did not show any evidence of ICH, patient will remain intubated with PCCM consult for vent management. She had recent hospitalization with discharge from [facility] 10/38 for right leg bleeding in the setting of INR of 6.6, warfarin was held and reversed with vitamin K  Pertinent  Medical History  COPD, atrial fibrillation anticoagulated with Coumadin, aortic valve insufficiency, CKD stage IIIb, HFpEF, and chronic venous stasis dermatitis   Significant Hospital Events: Including procedures, antibiotic start and stop dates in addition to other pertinent events   11/4 admitted as code stroke female s/p thrombectomy  Interim History / Subjective:  Transferred to ICU intubated and sedated on propofol On Cleviprex and Cardizem   Objective   There were no vitals taken for this visit.        Intake/Output Summary (Last 24 hours) at 11/12/2022 0745 Last data filed at 11/12/2022 0739 Gross per 24 hour  Intake  100 ml  Output --  Net 100 ml   There were no vitals filed for this visit.  Examination: General: Elderly woman, orally intubated, sedated HENT: Pinpoint pupils, not reactive to light, no JVD Lungs: Bilateral ventilated breath sounds Cardiovascular: S1-S2 regular, no murmur Abdomen: Soft, nontender Extremities: No edema, no deformity Neuro: RASS -3   Labs show normal electrolytes, no leukocytosis, hemoglobin 10.4 Chest x-ray showed cardiomegaly  Resolved Hospital Problem list     Assessment & Plan:  Acute ischemic stroke , likely embolic after stopping warfarin for atrial fibrillation due to bleeding -MRI brain at 11/4 revealed an acute left MCA distribution infarct involving the left basal ganglia, corona radiata, and insula.  CTA head and neck positive for LVO Status post revascularization of left ICA/left MCA and left ACA distribution P: Primary management per neurology Maintain neuro protective measures; goal for eurothermia, euglycemia, eunatermia, normoxia, and PCO2 goal of 35-40 Nutrition and bowel regiment  Seizure precautions  Aspirations precautions  Secondary stroke prevention Follow echo Resumption of anticoagulation/ heparin per neurology  Expected postprocedure mechanical ventilation -Patient remained intubated given poor neurological exam prior to thrombectomy Pulmonary edema -Seen on CTA head and neck History of COPD not in exacerbation on admission  P Continue ventilator support with lung protective strategies  Wean PEEP and FiO2 for sats greater than 90%. Head of bed elevated 30 degrees. Plateau pressures less than 30 cm H20.  Follow intermittent chest x-ray and ABG.   SAT/SBT as tolerated, mentation preclude extubation  Ensure adequate pulmonary hygiene  Follow cultures  VAP bundle in place  PAD protocol with propofol Diurese  as able with Lasix, dc IVFs  CAD  Atrial fibrillation anticoagulated with Coumadin P: Continuous telemetry  Strict  intake and output  Daily weight to assess volume status ECHO as above  Resume home metoprolol and can discontinue Cardizem  CKD stage IIIa -Current GFR at baseline appears to range between 48-58 P: Follow renal function  Monitor urine output Trend Bmet Avoid nephrotoxins Ensure adequate renal perfusion   Chronic venous stasis dermatitis P: Local would care     Best Practice (right click and "Reselect all SmartList Selections" daily)   Diet/type: NPO w/ oral meds DVT prophylaxis: SCD GI prophylaxis: PPI Lines: N/A Foley:  N/A Code Status:  DNR Last date of multidisciplinary goals of care discussion [updated HCPOA/nephew and confirmed DNR status]  Labs   CBC: Recent Labs  Lab 11/06/22 0324 11/07/22 0635 11/08/22 0426 11/11/22 1842 11/12/22 0201  WBC  --  7.8  --  7.5 9.6  NEUTROABS  --   --   --  5.5  --   HGB 9.4* 8.8* 8.6* 9.7* 10.4*  HCT  --  26.8*  --  31.7* 33.3*  MCV  --  94.7  --  102.9* 98.5  PLT  --  334  --  368 403*    Basic Metabolic Panel: Recent Labs  Lab 11/06/22 0324 11/07/22 0635 11/11/22 2026 11/12/22 0201  NA 133* 136 133* 135  K 3.3* 4.0 4.6 4.6  CL 101 106 104 105  CO2 25 21* 20* 20*  GLUCOSE 92 101* 92 92  BUN 22 21 25* 26*  CREATININE 0.95 0.92 1.11* 0.94  CALCIUM 7.7* 7.9* 8.1* 8.1*   GFR: Estimated Creatinine Clearance: 24.3 mL/min (by C-G formula based on SCr of 0.94 mg/dL). Recent Labs  Lab 11/07/22 0635 11/11/22 1842 11/12/22 0201  WBC 7.8 7.5 9.6  LATICACIDVEN  --  1.8  --     Liver Function Tests: Recent Labs  Lab 11/11/22 2026  AST 67*  ALT 35  ALKPHOS 115  BILITOT 1.0  PROT 6.6  ALBUMIN 2.9*   No results for input(s): "LIPASE", "AMYLASE" in the last 168 hours. No results for input(s): "AMMONIA" in the last 168 hours.  ABG No results found for: "PHART", "PCO2ART", "PO2ART", "HCO3", "TCO2", "ACIDBASEDEF", "O2SAT"   Coagulation Profile: Recent Labs  Lab 11/06/22 0324 11/07/22 0635 11/11/22 1815   INR 1.9* 1.4* 1.2    Cardiac Enzymes: No results for input(s): "CKTOTAL", "CKMB", "CKMBINDEX", "TROPONINI" in the last 168 hours.  HbA1C: No results found for: "HGBA1C"  CBG: No results for input(s): "GLUCAP" in the last 168 hours.  Review of Systems:   Unable to obtain  Past Medical History:  She,  has a past medical history of Actinic keratosis, Actinic keratosis (11/02/2019), Allergy, Atrial fibrillation (HCC), CHF (congestive heart failure) (HCC), History of squamous cell carcinoma, Hyperlipidemia, Hypertension, Renal insufficiency, Squamous cell carcinoma of skin (02/25/2017), and Thyroid disease.   Surgical History:   Past Surgical History:  Procedure Laterality Date   ANTERIOR APPROACH HEMI HIP ARTHROPLASTY Left 02/12/2019   Procedure: ANTERIOR APPROACH HEMI HIP ARTHROPLASTY;  Surgeon: Kennedy Bucker, MD;  Location: [facility] ORS;  Service: Orthopedics;  Laterality: Left;   CHOLECYSTECTOMY     HIP SURGERY       Social History:   reports that she has never smoked. She has been exposed to tobacco smoke. She has never used smokeless tobacco. She reports that she does not drink alcohol and does not use drugs.   Family History:  Her family history includes Breast cancer (age of onset: 23) in her mother; Heart disease in her father.   Allergies Allergies  Allergen Reactions   Fosamax [Alendronate Sodium] Nausea And Vomiting   Other Rash   Penicillins Nausea And Vomiting and Palpitations     Home Medications  Prior to Admission medications   Medication Sig Start Date End Date Taking? Authorizing Provider  acetaminophen (TYLENOL) 500 MG tablet Take 1-2 tablets (500-1,000 mg total) by mouth every 8 (eight) hours as needed for mild pain or moderate pain. 02/14/19   Evon Slack, PA-C  Cholecalciferol 50 MCG (2000 UT) CAPS Take 2,000 Units by mouth daily.    [provider]  esomeprazole (NEXIUM) 40 MG capsule Take 40 mg by mouth daily at 12 noon.    [provider]  feeding supplement, ENSURE ENLIVE, (ENSURE ENLIVE) LIQD Take 237 mLs by mouth 2 (two) times daily between meals. 02/18/19   Pennie Banter, DO  ferrous fumarate-b12-vitamic C-folic acid (TRINSICON / FOLTRIN) capsule Take 1 capsule by mouth 2 (two) times daily. 11/08/22   Marrion Coy, MD  furosemide (LASIX) 20 MG tablet Take 1 tablet (20 mg total) by mouth daily. 02/19/19   Pennie Banter, DO  levothyroxine (SYNTHROID, LEVOTHROID) 100 MCG tablet Take 100 mcg by mouth daily before breakfast.    [provider]  magnesium oxide (MAG-OX) 400 MG tablet Take 400 mg by mouth daily.    [provider]  methocarbamol (ROBAXIN) 500 MG tablet Take 500 mg by mouth every 6 (six) hours as needed for muscle spasms.    [provider]  metoprolol succinate (TOPROL-XL) 50 MG 24 hr tablet Take 1 tablet (50 mg total) by mouth daily. Take with or immediately following a meal. 11/08/22   Marrion Coy, MD  simvastatin (ZOCOR) 20 MG tablet Take 20 mg by mouth daily.    [provider]  spironolactone (ALDACTONE) 25 MG tablet Take 25 mg by mouth daily. 08/21/22   [provider]  traMADol (ULTRAM) 50 MG tablet Take 50 mg by mouth every 6 (six) hours as needed. 07/27/19   [provider]  triamcinolone ointment (KENALOG) 0.1 % Apply 1 Application topically 2 (two) times daily as needed (Rash). To affected areas at legs. Avoid applying to face, groin, and axilla. Use as directed. Long-term use can cause thinning of the skin. 10/30/22   Elie Goody, MD     Critical care time: 76 m     Cyril Mourning MD. [facility]. [facility] Pager : [phone]  If no response to pager , please call [phone] until 7 pm After 7:00 pm call Elink  [phone]   11/12/2022

## 2022-11-12 NOTE — Progress Notes (Signed)
More awake, rt hemiplegia Weaning PS5/5 - good TV Discussed with POA & proceed with one way extubation.  Comer Locket Vassie Loll MD

## 2022-11-12 NOTE — Progress Notes (Signed)
Pt sedated, paralyzed and intubated

## 2022-11-13 ENCOUNTER — Encounter (HOSPITAL_COMMUNITY): Payer: Self-pay | Admitting: Radiology

## 2022-11-13 DIAGNOSIS — I63032 Cerebral infarction due to thrombosis of left carotid artery: Secondary | ICD-10-CM

## 2022-11-13 DIAGNOSIS — I63412 Cerebral infarction due to embolism of left middle cerebral artery: Secondary | ICD-10-CM | POA: Diagnosis not present

## 2022-11-13 DIAGNOSIS — Z515 Encounter for palliative care: Secondary | ICD-10-CM | POA: Diagnosis not present

## 2022-11-13 DIAGNOSIS — Z7189 Other specified counseling: Secondary | ICD-10-CM

## 2022-11-13 DIAGNOSIS — R4182 Altered mental status, unspecified: Secondary | ICD-10-CM | POA: Diagnosis not present

## 2022-11-13 DIAGNOSIS — I63512 Cerebral infarction due to unspecified occlusion or stenosis of left middle cerebral artery: Secondary | ICD-10-CM | POA: Diagnosis present

## 2022-11-13 LAB — HEPARIN LEVEL (UNFRACTIONATED): Heparin Unfractionated: 0.23 [IU]/mL — ABNORMAL LOW (ref 0.30–0.70)

## 2022-11-13 LAB — CBC WITH DIFFERENTIAL/PLATELET
Abs Immature Granulocytes: 0.04 10*3/uL (ref 0.00–0.07)
Basophils Absolute: 0 10*3/uL (ref 0.0–0.1)
Basophils Relative: 0 %
Eosinophils Absolute: 0 10*3/uL (ref 0.0–0.5)
Eosinophils Relative: 0 %
HCT: 26.7 % — ABNORMAL LOW (ref 36.0–46.0)
Hemoglobin: 8.6 g/dL — ABNORMAL LOW (ref 12.0–15.0)
Immature Granulocytes: 0 %
Lymphocytes Relative: 5 %
Lymphs Abs: 0.6 10*3/uL — ABNORMAL LOW (ref 0.7–4.0)
MCH: 32 pg (ref 26.0–34.0)
MCHC: 32.2 g/dL (ref 30.0–36.0)
MCV: 99.3 fL (ref 80.0–100.0)
Monocytes Absolute: 0.7 10*3/uL (ref 0.1–1.0)
Monocytes Relative: 7 %
Neutro Abs: 9.2 10*3/uL — ABNORMAL HIGH (ref 1.7–7.7)
Neutrophils Relative %: 88 %
Platelets: 341 10*3/uL (ref 150–400)
RBC: 2.69 MIL/uL — ABNORMAL LOW (ref 3.87–5.11)
RDW: 17.7 % — ABNORMAL HIGH (ref 11.5–15.5)
WBC: 10.5 10*3/uL (ref 4.0–10.5)
nRBC: 0 % (ref 0.0–0.2)

## 2022-11-13 LAB — BASIC METABOLIC PANEL
Anion gap: 11 (ref 5–15)
BUN: 25 mg/dL — ABNORMAL HIGH (ref 8–23)
CO2: 17 mmol/L — ABNORMAL LOW (ref 22–32)
Calcium: 7.6 mg/dL — ABNORMAL LOW (ref 8.9–10.3)
Chloride: 114 mmol/L — ABNORMAL HIGH (ref 98–111)
Creatinine, Ser: 1.14 mg/dL — ABNORMAL HIGH (ref 0.44–1.00)
GFR, Estimated: 46 mL/min — ABNORMAL LOW (ref >60)
Glucose, Bld: 68 mg/dL — ABNORMAL LOW (ref 70–99)
Potassium: 3.8 mmol/L (ref 3.5–5.1)
Sodium: 142 mmol/L (ref 135–145)

## 2022-11-13 LAB — LIPID PANEL
Cholesterol: 120 mg/dL (ref 0–200)
HDL: 53 mg/dL (ref >40)
LDL Cholesterol: 57 mg/dL (ref 0–99)
Total CHOL/HDL Ratio: 2.3 {ratio}
Triglycerides: 52 mg/dL (ref <150)
VLDL: 10 mg/dL (ref 0–40)

## 2022-11-13 MED ORDER — MORPHINE SULFATE (CONCENTRATE) 10 MG/0.5ML PO SOLN: 5.0000 mg | ORAL | Status: DC | PRN

## 2022-11-13 MED ORDER — HALOPERIDOL LACTATE 5 MG/ML IJ SOLN: 0.5000 mg | INTRAMUSCULAR | Status: DC | PRN

## 2022-11-13 MED ORDER — ACETAMINOPHEN 325 MG PO TABS: 650.0000 mg | ORAL_TABLET | Freq: Four times a day (QID) | ORAL | Status: DC | PRN

## 2022-11-13 MED ORDER — BISACODYL 10 MG RE SUPP: 10.0000 mg | Freq: Every day | RECTAL | Status: DC | PRN

## 2022-11-13 MED ORDER — GLYCOPYRROLATE 1 MG PO TABS: 1.0000 mg | ORAL_TABLET | ORAL | Status: DC | PRN

## 2022-11-13 MED ORDER — ACETAMINOPHEN 650 MG RE SUPP: 650.0000 mg | Freq: Four times a day (QID) | RECTAL | Status: DC | PRN

## 2022-11-13 MED ORDER — ONDANSETRON 4 MG PO TBDP: 4.0000 mg | ORAL_TABLET | Freq: Four times a day (QID) | ORAL | Status: DC | PRN

## 2022-11-13 MED ORDER — POLYVINYL ALCOHOL 1.4 % OP SOLN: 1.0000 [drp] | Freq: Four times a day (QID) | OPHTHALMIC | Status: DC | PRN

## 2022-11-13 MED ORDER — HALOPERIDOL 0.5 MG PO TABS: 0.5000 mg | ORAL_TABLET | ORAL | Status: DC | PRN

## 2022-11-13 MED ORDER — MORPHINE SULFATE (PF) 2 MG/ML IV SOLN
1.0000 mg | INTRAVENOUS | Status: DC | PRN
  Administered 2022-11-13 (×2): 2 mg via INTRAVENOUS
  Filled 2022-11-13 (×3): qty 1

## 2022-11-13 MED ORDER — LORAZEPAM 2 MG/ML IJ SOLN: 1.0000 mg | INTRAMUSCULAR | Status: DC | PRN

## 2022-11-13 MED ORDER — LORAZEPAM 2 MG/ML PO CONC: 1.0000 mg | ORAL | Status: DC | PRN

## 2022-11-13 MED ORDER — GLYCOPYRROLATE 0.2 MG/ML IJ SOLN: 0.2000 mg | INTRAMUSCULAR | Status: DC | PRN

## 2022-11-13 MED ORDER — LORAZEPAM 1 MG PO TABS: 1.0000 mg | ORAL_TABLET | ORAL | Status: DC | PRN

## 2022-11-13 MED ORDER — ONDANSETRON HCL 4 MG/2ML IJ SOLN: 4.0000 mg | Freq: Four times a day (QID) | INTRAMUSCULAR | Status: DC | PRN

## 2022-11-13 MED ORDER — BIOTENE DRY MOUTH MT LIQD: 15.0000 mL | OROMUCOSAL | Status: DC | PRN

## 2022-11-13 MED ORDER — HALOPERIDOL LACTATE 2 MG/ML PO CONC: 0.5000 mg | ORAL | Status: DC | PRN

## 2022-11-13 NOTE — Discharge Summary (Addendum)
Stroke Discharge Summary  Patient ID: Elizabeth Fields   MRN: [id number]      DOB: 04-11-1931  Date of Admission: 11/12/2022 Date of Discharge: 11/13/2022  Attending Physician:  Stroke, Md, MD Patient's PCP:  Gracelyn Nurse, MD  DISCHARGE DIAGNOSIS: Acute Ischemic Infarct: left MCA territory s/p mechanical thrombectomy Etiology:  likely embolic secondary to Atrial Fibrillation and stopping coumadin d/t bleeding and anemia  Principal Problem:   Stroke [facility]) Active Problems:   Middle cerebral aphasia Right hemiplegia   Allergies as of 11/13/2022       Reactions   Fosamax [alendronate Sodium] Nausea And Vomiting   Other Rash   Penicillins Nausea And Vomiting, Palpitations        Medication List     STOP taking these medications    acetaminophen 500 MG tablet Commonly known as: TYLENOL   Cholecalciferol 50 MCG (2000 UT) Caps   esomeprazole 40 MG capsule Commonly known as: NEXIUM   feeding supplement Liqd   ferrous fumarate-b12-vitamic C-folic acid capsule Commonly known as: TRINSICON / FOLTRIN   furosemide 20 MG tablet Commonly known as: LASIX   levothyroxine 100 MCG tablet Commonly known as: SYNTHROID   magnesium oxide 400 MG tablet Commonly known as: MAG-OX   methocarbamol 500 MG tablet Commonly known as: ROBAXIN   metoprolol succinate 50 MG 24 hr tablet Commonly known as: TOPROL-XL   simvastatin 20 MG tablet Commonly known as: ZOCOR   spironolactone 25 MG tablet Commonly known as: ALDACTONE   traMADol 50 MG tablet Commonly known as: ULTRAM   triamcinolone ointment 0.1 % Commonly known as: KENALOG               Discharge Care Instructions  (From admission, onward)           Start     Ordered   11/13/22 0000  No dressing needed        11/13/22 1602            LABORATORY STUDIES CBC    Component Value Date/Time   WBC 10.5 11/13/2022 0611   RBC 2.69 (L) 11/13/2022 0611   HGB 8.6 (L) 11/13/2022 0611   HGB 12.5  02/03/2020 1557   HCT 26.7 (L) 11/13/2022 0611   HCT 36.6 02/03/2020 1557   PLT 341 11/13/2022 0611   PLT 232 02/03/2020 1557   MCV 99.3 11/13/2022 0611   MCV 96 02/03/2020 1557   MCH 32.0 11/13/2022 0611   MCHC 32.2 11/13/2022 0611   RDW 17.7 (H) 11/13/2022 0611   RDW 13.1 02/03/2020 1557   LYMPHSABS 0.6 (L) 11/13/2022 0611   MONOABS 0.7 11/13/2022 0611   EOSABS 0.0 11/13/2022 0611   BASOSABS 0.0 11/13/2022 0611   CMP    Component Value Date/Time   NA 142 11/13/2022 0611   NA 140 02/03/2020 1557   K 3.8 11/13/2022 0611   CL 114 (H) 11/13/2022 0611   CO2 17 (L) 11/13/2022 0611   GLUCOSE 68 (L) 11/13/2022 0611   BUN 25 (H) 11/13/2022 0611   BUN 23 02/03/2020 1557   CREATININE 1.14 (H) 11/13/2022 0611   CREATININE 1.13 09/13/2011 0951   CALCIUM 7.6 (L) 11/13/2022 0611   PROT 6.6 11/11/2022 2026   PROT 7.8 02/03/2020 1557   ALBUMIN 2.9 (L) 11/11/2022 2026   ALBUMIN 4.9 (H) 02/03/2020 1557   AST 67 (H) 11/11/2022 2026   ALT 35 11/11/2022 2026   ALKPHOS 115 11/11/2022 2026   BILITOT 1.0 11/11/2022 2026  BILITOT 0.6 02/03/2020 1557   GFRNONAA 46 (L) 11/13/2022 0611   GFRNONAA 47 (L) 09/13/2011 0951   GFRAA 41 (L) 02/03/2020 1557   GFRAA 54 (L) 09/13/2011 0951   COAGS Lab Results  Component Value Date   INR 1.2 11/11/2022   INR 1.4 (H) 11/07/2022   INR 1.9 (H) 11/06/2022   Lipid Panel    Component Value Date/Time   CHOL 120 11/13/2022 0611   TRIG 52 11/13/2022 0611   HDL 53 11/13/2022 0611   CHOLHDL 2.3 11/13/2022 0611   VLDL 10 11/13/2022 0611   LDLCALC 57 11/13/2022 0611   HgbA1C  Lab Results  Component Value Date   HGBA1C 5.4 11/12/2022   Urinalysis    Component Value Date/Time   COLORURINE YELLOW (A) 11/11/2022 1942   APPEARANCEUR CLEAR (A) 11/11/2022 1942   LABSPEC 1.012 11/11/2022 1942   PHURINE 5.0 11/11/2022 1942   GLUCOSEU NEGATIVE 11/11/2022 1942   HGBUR NEGATIVE 11/11/2022 1942   BILIRUBINUR NEGATIVE 11/11/2022 1942   KETONESUR NEGATIVE  11/11/2022 1942   PROTEINUR NEGATIVE 11/11/2022 1942   NITRITE NEGATIVE 11/11/2022 1942   LEUKOCYTESUR NEGATIVE 11/11/2022 1942   Urine Drug Screen No results found for: "LABOPIA", "COCAINSCRNUR", "LABBENZ", "AMPHETMU", "THCU", "LABBARB"  Alcohol Level No results found for: "ETH"   SIGNIFICANT DIAGNOSTIC STUDIES MR BRAIN WO CONTRAST  Result Date: 11/12/2022 CLINICAL DATA:  Altered mental status EXAM: MRI HEAD WITHOUT CONTRAST TECHNIQUE: Multiplanar, multiecho pulse sequences of the brain and surrounding structures were obtained without intravenous contrast. COMPARISON:  Brain MRI 11/12/2022 12:33 a.m. FINDINGS: Brain: Unchanged appearance abnormal DWI signal within the left deep MCA territory. No new site of acute or subacute ischemia. No chronic microhemorrhage or siderosis. There is multifocal hyperintense T2-weighted signal within the white matter. Generalized volume loss. The midline structures are normal. Vascular: Normal flow voids Skull and upper cervical spine: Normal marrow signal Sinuses/Orbits: Paranasal sinuses are clear. No mastoid effusion. Normal orbits. Ocular lens replacements. Other: None IMPRESSION: Unchanged appearance of acute/subacute left deep MCA territory infarct. No new site of acute or subacute ischemia. Electronically Signed   By: Deatra Robinson M.D.   On: 11/12/2022 19:09   ECHOCARDIOGRAM COMPLETE  Result Date: 11/12/2022    ECHOCARDIOGRAM REPORT   Patient Name:   Elizabeth Fields Date of Exam: 11/12/2022 Medical Rec #:  [id number]    Height:       59.0 in Accession #:    [id number]   Weight:       83.8 lb Date of Birth:  Aug 13, 1931    BSA:          1.274 m Patient Age:    89 years     BP:           131/66 mmHg Patient Gender: F            HR:           85 bpm. Exam Location:  Inpatient Procedure: 2D Echo, Cardiac Doppler and Color Doppler Indications:    Stroke  History:        Patient has prior history of Echocardiogram examinations, most                 recent 08/10/2019. CHF,  CAD, Stroke, Aortic Valve Disease,                 Arrythmias:Atrial Fibrillation, Signs/Symptoms:Altered Mental                 Status; Risk Factors:Hypertension and  Dyslipidemia. Thyroid                 disease, CKD.  Sonographer:    Milda Smart Referring Phys: [phone] ERIC LINDZEN  Sonographer Comments: Image acquisition challenging due to respiratory motion. Pt on vent. IMPRESSIONS  1. Left ventricular ejection fraction, by estimation, is 60 to 65%. The left ventricle has normal function. The left ventricle has no regional wall motion abnormalities. Left ventricular diastolic function could not be evaluated.  2. Right ventricular systolic function is normal. The right ventricular size is normal. There is moderately elevated pulmonary artery systolic pressure. The estimated right ventricular systolic pressure is 49.3 mmHg.  3. Left atrial size was severely dilated.  4. Right atrial size was severely dilated.  5. The mitral valve is abnormal. Mild to moderate mitral valve regurgitation. No evidence of mitral stenosis.  6. Tricuspid valve regurgitation is severe.  7. The aortic valve is tricuspid. There is mild calcification of the aortic valve. Aortic valve regurgitation is mild. Aortic valve sclerosis/calcification is present, without any evidence of aortic stenosis.  8. The inferior vena cava is dilated in size with <50% respiratory variability, suggesting right atrial pressure of 15 mmHg. Conclusion(s)/Recommendation(s): Findings suggestive of restrictive cardiomyopathy. FINDINGS  Left Ventricle: Left ventricular ejection fraction, by estimation, is 60 to 65%. The left ventricle has normal function. The left ventricle has no regional wall motion abnormalities. The left ventricular internal cavity size was normal in size. There is  no left ventricular hypertrophy. Left ventricular diastolic function could not be evaluated due to atrial fibrillation. Left ventricular diastolic function could not be evaluated.  Right Ventricle: The right ventricular size is normal. No increase in right ventricular wall thickness. Right ventricular systolic function is normal. There is moderately elevated pulmonary artery systolic pressure. The tricuspid regurgitant velocity is 2.93 m/s, and with an assumed right atrial pressure of 15 mmHg, the estimated right ventricular systolic pressure is 49.3 mmHg. Left Atrium: Left atrial size was severely dilated. Right Atrium: Right atrial size was severely dilated. Pericardium: There is no evidence of pericardial effusion. Mitral Valve: The mitral valve is abnormal. There is mild thickening of the mitral valve leaflet(s). Mild to moderate mitral valve regurgitation. No evidence of mitral valve stenosis. Tricuspid Valve: The tricuspid valve is normal in structure. Tricuspid valve regurgitation is severe. No evidence of tricuspid stenosis. Aortic Valve: The aortic valve is tricuspid. There is mild calcification of the aortic valve. Aortic valve regurgitation is mild. Aortic valve sclerosis/calcification is present, without any evidence of aortic stenosis. Pulmonic Valve: The pulmonic valve was normal in structure. Pulmonic valve regurgitation is trivial. No evidence of pulmonic stenosis. Aorta: The aortic root is normal in size and structure. Venous: The inferior vena cava is dilated in size with less than 50% respiratory variability, suggesting right atrial pressure of 15 mmHg. IAS/Shunts: No atrial level shunt detected by color flow Doppler.  LEFT VENTRICLE PLAX 2D LVIDd:         4.10 cm     Diastology LVIDs:         3.30 cm     LV e' medial:  4.03 cm/s LV PW:         0.80 cm     LV e' lateral: 4.13 cm/s LV IVS:        0.60 cm LVOT diam:     1.80 cm LV SV:         49 LV SV Index:   38 LVOT Area:  2.54 cm  LV Volumes (MOD) LV vol d, MOD A4C: 34.2 ml LV vol s, MOD A4C: 13.1 ml LV SV MOD A4C:     34.2 ml RIGHT VENTRICLE RV Basal diam:  3.00 cm RV S prime:     9.25 cm/s TAPSE (M-mode): 0.9 cm LEFT  ATRIUM             Index        RIGHT ATRIUM           Index LA diam:        4.20 cm 3.30 cm/m   RA Area:     19.40 cm LA Vol (A2C):   43.4 ml 34.06 ml/m  RA Volume:   54.60 ml  42.85 ml/m LA Vol (A4C):   48.3 ml 37.91 ml/m LA Biplane Vol: 49.0 ml 38.46 ml/m  AORTIC VALVE LVOT Vmax:         93.20 cm/s LVOT Vmean:        66.100 cm/s LVOT VTI:          0.192 m AR Vena Contracta: 0.10 cm  AORTA Ao Root diam: 2.40 cm Ao Asc diam:  2.10 cm MR Peak grad: 79.6 mmHg   TRICUSPID VALVE MR Mean grad: 57.0 mmHg   TR Peak grad:   34.3 mmHg MR Vmax:      446.00 cm/s TR Mean grad:   26.0 mmHg MR Vmean:     363.0 cm/s  TR Vmax:        293.00 cm/s                           TR Vmean:       249.0 cm/s                            SHUNTS                           Systemic VTI:  0.19 m                           Systemic Diam: 1.80 cm Arvilla Meres MD Electronically signed by Arvilla Meres MD Signature Date/Time: 11/12/2022/3:23:58 PM    Final    CT CEREBRAL PERFUSION W CONTRAST  Result Date: 11/12/2022 CLINICAL DATA:  89 year old female with occluded cervical left ICA, left ICA siphon, left MCA. Patchy left MCA infarct on MRI 0033 hours today. EXAM: CT PERFUSION BRAIN TECHNIQUE: Multiphase CT imaging of the brain was performed following IV bolus contrast injection. Subsequent parametric perfusion maps were calculated using RAPID software. RADIATION DOSE REDUCTION: This exam was performed according to the departmental dose-optimization program which includes automated exposure control, adjustment of the mA and/or kV according to patient size and/or use of iterative reconstruction technique. CONTRAST:  40mL OMNIPAQUE IOHEXOL 350 MG/ML SOLN COMPARISON:  Brain MRI 0033 hours and CTA head and neck 0414 hours today. FINDINGS: ASPECTS: 9 (abnormal left insula 2059 hours yesterday). CBF (<30%) Volume: 16mL, with range up to 33 mL (CBF less than 38%). Only 5 mL of abnormal CBV detected. Perfusion (Tmax>6.0s) volume: ,  hypoperfusion index of 0.5 (T-max greater than 10s volume estimated at 77 mL). Mismatch Volume: Up to , minimum mismatch estimated at 78 mL. Infarction Location:Left MCA IMPRESSION: Positive for mismatch of Left MCA oligemia (estimated 155 mL) and core infarct volume (estimated  between 33 and 77 mL when considering both CBF and Tmax >10s). Hypoperfusion index of 0.5. Electronically Signed   By: Odessa Fleming M.D.   On: 11/12/2022 05:44   CT ANGIO HEAD NECK W WO CM  Result Date: 11/12/2022 CLINICAL DATA:  89 year old female with altered mental status. Left MCA infarct by MRI and abnormal left ICA flow void. EXAM: CT ANGIOGRAPHY HEAD AND NECK TECHNIQUE: Multidetector CT imaging of the head and neck was performed using the standard protocol during bolus administration of intravenous contrast. Multiplanar CT image reconstructions and MIPs were obtained to evaluate the vascular anatomy. Carotid stenosis measurements (when applicable) are obtained utilizing NASCET criteria, using the distal internal carotid diameter as the denominator. RADIATION DOSE REDUCTION: This exam was performed according to the departmental dose-optimization program which includes automated exposure control, adjustment of the mA and/or kV according to patient size and/or use of iterative reconstruction technique. CONTRAST:  75mL OMNIPAQUE IOHEXOL 350 MG/ML SOLN COMPARISON:  Brain MRI 0037 hours today.  No prior CTA. FINDINGS: CTA NECK Skeleton: Cervical spine degeneration with spondylolisthesis and some dextroconvex scoliosis. No acute osseous abnormality identified. Upper chest: Layering pleural effusions with simple fluid density, at least moderate. Upper lobe pulmonary septal thickening and patchy asymmetric ground-glass opacity. No superior mediastinal lymphadenopathy identified. Other neck: Negative. Aortic arch: 3 vessel arch.  Calcified aortic atherosclerosis. Right carotid system: Brachiocephalic artery origin not entirely included, but  the brachiocephalic and right CCA origin appear normal. Minor calcified plaque at the right carotid bifurcation. No right carotid stenosis to the skull base. Left carotid system: Normal left CCA origin. Mildly tortuous proximal left CCA. Calcified plaque at the left ICA origin which is less than 50 % stenosis with respect to the distal vessel. Occluded left ICA in the neck, with fading contrast enhancement beginning at the bulb. Absent left ICA enhancement about 2.5 cm from the origin. No reconstitution to the skull base. Vertebral arteries: Proximal right subclavian artery and right vertebral artery origin are patent with no significant plaque or stenosis. Right vertebral remains patent to the skull base with no significant plaque or stenosis. Proximal left subclavian artery soft and calcified plaque without significant stenosis. Left vertebral artery origin is normal. Tortuous left V1 segment. Left vertebral artery is codominant and patent to the skull base with no significant stenosis. CTA HEAD Posterior circulation: Codominant distal vertebral arteries and vertebrobasilar junction, PICA origins are patent with no significant plaque or stenosis. Patent basilar artery without stenosis. Patent SCA and PCA origins. Posterior communicating arteries are diminutive or absent. Bilateral PCA branches are within normal limits. Anterior circulation: Left ICA siphon and terminus are occluded. Reconstituted left ACA A1 enhancement. Left MCA is occluded. Poor appearance of left MCA collaterals. Contralateral right ICA siphon is patent with mild to moderate right siphon calcified plaque. Only mild right siphon stenosis in the supraclinoid segment. Patent right ICA terminus, right MCA and ACA origins. Normal anterior communicating artery. Patent and fairly symmetric bilateral ACA branches, mild motion artifact at the distal ACAs. Right MCA M1 and trifurcation are patent without stenosis. Right MCA branches are within normal  limits. Venous sinuses: Early contrast timing, not evaluated. Anatomic variants: None. Review of the MIP images confirms the above findings IMPRESSION: 1. Positive Large Vessel Occlusion: Occluded Left ICA in the neck (about 2.5 cm from the origin), with occluded Left ICA siphon, terminus, and occluded Left MCA. 2. Reconstituted Left ACA via the anterior communicating artery. Mild for age other atherosclerosis in the head and neck  with no other hemodynamically significant stenosis. Aortic Atherosclerosis (ICD10-I70.0). 3. Evidence of Acute Pulmonary Edema and at least moderate layering pleural effusions in the upper chest. #1 discussed by telephone with Dr. Valente David on 11/12/2022 at 04:32 . Electronically Signed   By: Odessa Fleming M.D.   On: 11/12/2022 04:35   MR BRAIN WO CONTRAST  Result Date: 11/12/2022 CLINICAL DATA:  Initial evaluation for altered mental status. EXAM: MRI HEAD WITHOUT CONTRAST TECHNIQUE: Multiplanar, multiecho pulse sequences of the brain and surrounding structures were obtained without intravenous contrast. COMPARISON:  Prior CT from earlier the same day. FINDINGS: Brain: Examination moderately degraded by motion artifact. Generalized age-related cerebral atrophy. Mild chronic microvascular ischemic disease noted involving the periventricular and deep white matter both cerebral hemispheres. Patchy and confluent restricted diffusion involving the left basal ganglia, corona radiata, and left insula, consistent with an acute left MCA distribution infarct. No associated hemorrhage or mass effect. No other evidence for acute or subacute ischemia. No acute intracranial hemorrhage. No mass lesion or mass effect. No hydrocephalus. Small subdural collections overlying the bilateral cerebral convexities measure up to 2 mm bilaterally, likely small chronic bilateral subdural hematomas. No acute blood products seen on prior head CT. No significant mass effect or midline shift. Pituitary gland and  suprasellar region within normal limits. Vascular: Loss of normal flow void within the left ICA to the terminus, which could reflect slow flow and/or occlusion. Major intracranial vascular flow voids are otherwise grossly maintained on this motion degraded exam. Skull and upper cervical spine: Craniocervical junction within normal limits. Bone marrow signal intensity normal. No scalp soft tissue abnormality. Sinuses/Orbits: Prior bilateral ocular lens replacement. Paranasal sinuses are largely clear. No significant mastoid effusion. Other: None. IMPRESSION: 1. Acute left MCA distribution infarct involving the left basal ganglia, corona radiata, and insula. No associated hemorrhage or mass effect. 2. Loss of normal flow void within the left ICA to the terminus, which could reflect slow flow and/or occlusion. 3. Small subdural collections overlying the bilateral cerebral convexities measuring up to 2 mm bilaterally, likely small chronic bilateral subdural hematomas. No acute blood products seen on prior head CT. No significant mass effect or midline shift. 4. Underlying age-related cerebral atrophy with mild chronic small vessel ischemic disease. Results were communicated by telephone at the time of interpretation on 11/12/2022 at 12:14 am to provider Dr. Loleta Rose. Electronically Signed   By: Rise Mu M.D.   On: 11/12/2022 00:18   DG Abd Portable 1V  Result Date: 11/11/2022 CLINICAL DATA:  Altered mental status, pre MRI metal screening EXAM: PORTABLE ABDOMEN - 1 VIEW COMPARISON:  None Available. FINDINGS: No unexpected metallic foreign body identified within the abdomen and pelvis save for multiple metallic buttons overlying the patient. Normal abdominal gas pattern. No gross free intraperitoneal gas. Cholecystectomy clips are seen in the right upper quadrant. No organomegaly. Advanced vascular calcifications. Left hip bipolar hemiarthroplasty and right hip ORIF noted. Advanced degenerative changes  are seen within the lumbar spine with compression deformities of T12, L1, and L2, not well profiled on this examination. IMPRESSION: 1. No unexpected metallic foreign body identified within the abdomen and pelvis. 2. Advanced vascular calcifications. 3. Advanced degenerative changes within the lumbar spine with compression deformities of T12, L1, and L2, not well profiled on this examination. Electronically Signed   By: Helyn Numbers M.D.   On: 11/11/2022 23:07   CT Head Wo Contrast  Result Date: 11/11/2022 CLINICAL DATA:  Mental status change EXAM: CT HEAD WITHOUT CONTRAST  TECHNIQUE: Contiguous axial images were obtained from the base of the skull through the vertex without intravenous contrast. RADIATION DOSE REDUCTION: This exam was performed according to the departmental dose-optimization program which includes automated exposure control, adjustment of the mA and/or kV according to patient size and/or use of iterative reconstruction technique. COMPARISON:  CT brain 09/06/2021 FINDINGS: Brain: No acute territorial infarction, hemorrhage or intracranial mass. Moderate atrophy. Mild chronic small vessel ischemic changes the white matter. Stable ventricle size. Vascular: No hyperdense vessels.  Carotid vascular calcification. Skull: Normal. Negative for fracture or focal lesion. Sinuses/Orbits: No acute finding. Other: None. IMPRESSION: 1. No CT evidence for acute intracranial abnormality. 2. Atrophy and chronic small vessel ischemic changes of the white matter. Electronically Signed   By: Jasmine Pang M.D.   On: 11/11/2022 20:07   DG Chest Port 1 View  Result Date: 11/11/2022 CLINICAL DATA:  Altered mental status, possible sepsis EXAM: PORTABLE CHEST 1 VIEW COMPARISON:  11/02/2022 FINDINGS: Lungs are clear.  No pleural effusion or pneumothorax. Cardiomegaly. IMPRESSION: Cardiomegaly. No acute cardiopulmonary disease. Electronically Signed   By: Charline Bills M.D.   On: 11/11/2022 19:53   DG Chest 2  View  Result Date: 11/02/2022 CLINICAL DATA:  Suspected Sepsis.  Right leg pain. EXAM: CHEST - 2 VIEW COMPARISON:  02/14/2019. FINDINGS: Bilateral lung fields are clear. Bilateral costophrenic angles are clear. Normal cardio-mediastinal silhouette. No acute osseous abnormalities. Moderate anterior wedging deformity of upper thoracic vertebrae and mild-to-moderate anterior wedging deformity of lower thoracic vertebrae are unchanged since the prior studies from 02/03/2020. The soft tissues are within normal limits. IMPRESSION: *No acute cardiopulmonary abnormality. *Stable compression deformities of thoracic vertebra, unchanged since the prior study from 02/03/2020. Electronically Signed   By: Jules Schick M.D.   On: 11/02/2022 11:02      HISTORY OF PRESENT ILLNESS Elizabeth Fields is an 89 y.o. female with a PMHx of CKD, atrial fibrillation on warfarin, aortic valve insuffiency, CAD, CKD stage IIIb, HFpEF and chronic venous stasis dermatitis with ulcers who presented to the [facility] ED on Sunday evening with acute onset of altered mental status with somnolence which had occurred abruptly while talking to her relative over the phone. LKN was 5 PM on Sunday. Her relative called her neighbor who brought her to the emergency room.  The patient was nonverbal on arrival and was having left gaze preference. CTA showed occluded left ICA and MCA. CTP showed core infarct volume between 33-32ml. NIH after CTP was 35. CODE IR activated and patient was transferred to [facility].  Patient was recently discharged from [facility] for acute blood loss anemia secondary to supratherapeutic INR  6.6 with right leg bleeding. She was discharged off warfarin d/t anemia and bleeding.   HOSPITAL COURSE Acute Ischemic Infarct:  left MCA territory s/p mechanical thrombectomy Etiology:  likely embolic secondary to Atrial Fibrillation and stopping coumadin d/t bleeding and anemia  Code Stroke CT head: No acute abnormality. Small vessel disease.  Atrophy.   CTA head & neck  Positive Large Vessel Occlusion: Occluded Left ICA in the neck (about 2.5 cm from the origin), with occluded Left ICA siphon, terminus, and occluded Left MCA. CT perfusion  Positive for mismatch of Left MCA oligemia (estimated 155 mL) and core infarct volume (estimated between 33 and 77 mL when considering both CBF and Tmax >10s). Hypoperfusion index of 0.5. Post IR CT: no evidence of intracranial hemorrhage.  MRI:  Acute left MCA distribution infarct involving the left basal ganglia, corona  radiata, and insula. No associated hemorrhage or mass effect.  Loss of normal flow void within the left ICA to the terminus, which could reflect slow flow and/or occlusion 11/4 repeat MRI: stable left deep MCA territory infarct.    2D Echo: LVEF 60-65%, Elevated PASP, Severely Dilated left & right atria, Mild to moderate MVR, Severe TVR, Mild AVR.  LDL 51 HgbA1c 5.4 VTE prophylaxis - SCDs No antithrombotic (was recently taken off warfarin prior to admission due to anemia and bleeding), now on no antithrombotic due to comfort measures only.  Disposition: Hospice    Acute Pulmonary Edema Hx of COPD Intubated for procedure Extubated 11/4 @ 1500 Patient is now Comfort Care Measures only   Atrial fibrillation Home Meds: warfarin (recently taken off d/t bleeding and anemia) Continue telemetry monitoring Comfort care measures only, Heparin gtt stopped.    Hx of Hypertension CHF CAD Home meds:  Toprol-XL, lasix 20mg , spironolactone 25mg  Stable   Hyperlipidemia Home meds:  simvastatin 20mg  LDL 51, goal < 70 Continue statin at discharge when able to take PO  Dysphagia Patient has post-stroke dysphagia, SLP consulted       Diet    Diet NPO time specified        Advance diet as tolerated   Other Stroke Risk Factors Advanced age   Other Active Problems CKD IIIa GFR at baseline BUN 26, Creatinine 0.94 Hypothyroid Synthroid home med Hx of SCC    RN  Pressure Injury Documentation: Pressure Injury 11/12/22 Sacrum Stage 1 -  Intact skin with non-blanchable redness of a localized area usually over a bony prominence. blancheable are of reddenned tissue over the sacrum (Active)  11/12/22 0445  Location: Sacrum  Location Orientation:   Staging: Stage 1 -  Intact skin with non-blanchable redness of a localized area usually over a bony prominence.  Wound Description (Comments): blancheable are of reddenned tissue over the sacrum  Present on Admission: Yes  Dressing Type Foam - Lift dressing to assess site every shift 11/13/22 0800     DISCHARGE EXAM Blood pressure 106/86, pulse (!) 120, temperature (!) 97.4 F (36.3 C), temperature source Axillary, resp. rate (!) 22, SpO2 97%.  PHYSICAL EXAM General: Frail elderly cachectic malnourished looking Caucasian lady in no acute distress Psych:  Mood and affect appropriate for situation CV: Regular rate and rhythm on monitor Respiratory:  Regular, unlabored respirations on room air GI: Abdomen soft and non-tender   NEURO:  Mental Status: Globally aphasic, no verbal output, does not follow commands. Opens eyes very slightly to noxious stimuli. Right hemiplegia with hypotonia. Spontaneous, non-purposeful movement of LUE seen.  Withdraws left lower extremity.  Discharge Diet       There are no active orders of the following types: Diet, Nourishments.   liquids  DISCHARGE PLAN Disposition:  [facility] No antithrombotic for secondary stroke prevention due to comfort care measures/discharge to hospice.  Ongoing stroke risk factor control by Primary Care Physician at time of discharge Follow-up PCP Gracelyn Nurse, MD in 2 weeks. Follow-up in [facility] in 8 weeks, office to schedule an appointment.   32 minutes were spent preparing discharge.   Pt seen by Neuro NP/APP and later by MD. Note/plan to be edited by MD as needed.    Lynnae January, DNP,  AGACNP-BC [facility] Please use AMION for contact information & EPIC for messaging.  I have personally obtained history,examined this patient, reviewed notes, independently viewed imaging studies, participated in medical decision making  and plan of care.ROS completed by me personally and pertinent positives fully documented  I have made any additions or clarifications directly to the above note. Agree with note above.    Delia Heady, MD Medical Director [facility] Pager: [phone] 11/13/2022 5:08 PM

## 2022-11-13 NOTE — Progress Notes (Signed)
PHARMACY - ANTICOAGULATION CONSULT NOTE  Pharmacy Consult:  Heparin Indication: atrial fibrillation  Allergies  Allergen Reactions   Fosamax [Alendronate Sodium] Nausea And Vomiting   Other Rash   Penicillins Nausea And Vomiting and Palpitations    Patient Measurements:   Heparin Dosing Weight: 38 kg  Vital Signs: Temp: 97.4 F (36.3 C) (11/05 0800) Temp Source: Axillary (11/05 0800) BP: 111/48 (11/05 0700) Pulse Rate: 106 (11/05 0700)  Labs: Recent Labs    11/11/22 1815 11/11/22 1842 11/11/22 1842 11/11/22 2026 11/12/22 0201 11/12/22 1058 11/12/22 2233 11/13/22 0611  HGB  --  9.7*   < >  --  10.4* 10.2*  --  8.6*  HCT  --  31.7*   < >  --  33.3* 30.0*  --  26.7*  PLT  --  368  --   --  403*  --   --  341  APTT 24  --   --   --   --   --   --   --   LABPROT 15.3*  --   --   --   --   --   --   --   INR 1.2  --   --   --   --   --   --   --   HEPARINUNFRC  --   --   --   --   --   --  0.10* 0.23*  CREATININE  --   --   --  1.11* 0.94  --   --  1.14*   < > = values in this interval not displayed.    Estimated Creatinine Clearance: 20.1 mL/min (A) (by C-G formula based on SCr of 1.14 mg/dL (H)).   Medical History: Past Medical History:  Diagnosis Date   Actinic keratosis    Actinic keratosis 11/02/2019   left calf, bx proven   Allergy    Atrial fibrillation (HCC)    CHF (congestive heart failure) (HCC)    History of squamous cell carcinoma    Hyperlipidemia    Hypertension    Renal insufficiency    Squamous cell carcinoma of skin 02/25/2017   Right cheek. KA type.   Thyroid disease      Assessment: 13 YOF presented with an acute CVA s/p revascularization on 11/4.  Patient has a history of Afib recently came off of Coumadin due to elevated INR.  INR sub-therapeutic at 1.2.  Pharmacy consulted to dose IV heparin per stroke protocol.    Heparin level slightly below goal.  No bleeding nor issue reported.  Goal of Therapy:  Heparin level 0.3-0.5  units/ml Monitor platelets by anticoagulation protocol: Yes   Plan: No bolus Increase heparin gtt to 550 units/hr Check 8 hr heparin level  Daily heparin level and CBC  Tyrena Gohr D. Laney Potash, PharmD, BCPS, BCCCP 11/13/2022, 8:59 AM

## 2022-11-13 NOTE — TOC Transition Note (Signed)
Transition of Care [facility]) - CM/SW Discharge Note   Patient Details  Name: Elizabeth Fields MRN: [id number] Date of Birth: Mar 31, 1931  Transition of Care [facility]) CM/SW Contact:  Mearl Latin, LCSW Phone Number: 11/13/2022, 4:19 PM   Clinical Narrative:    Patient will DC to: [facility] Facility Anticipated DC date: 11/13/22 Family notified: Roby Lofts Transport by: Sharin Mons   Per MD patient ready for DC to Hospice. RN to call report prior to discharge [phone]). RN, patient, patient's family, and facility notified of DC. Discharge Summary sent to facility. DC packet on chart including signed DNR. Ambulance transport requested for patient.   CSW will sign off for now as social work intervention is no longer needed. Please consult [place] again if new needs arise.     Final next level of care: Hospice Medical Facility Barriers to Discharge: Barriers Resolved   Patient Goals and CMS Choice CMS Medicare.gov Compare Post Acute Care list provided to:: Patient Represenative (must comment) Choice offered to / list presented to :  (Nephew)  Discharge Placement                  Patient to be transferred to facility by: [facility] Name of family member notified: Nephew Patient and family notified of of transfer: 11/13/22  Discharge Plan and Services Additional resources added to the After Visit Summary for   In-house Referral: Clinical Social Work, Hospice / Palliative Care   Post Acute Care Choice: Hospice                               Social Determinants of Health (SDOH) Interventions SDOH Screenings   Food Insecurity: No Food Insecurity (11/02/2022)  Housing: Low Risk  (11/02/2022)  Transportation Needs: No Transportation Needs (11/02/2022)  Utilities: Not At Risk (11/02/2022)  Depression (PHQ2-9): Low Risk  (04/19/2020)  Financial Resource Strain: Low Risk  (08/21/2022)   Received from [facility]  Tobacco Use: Low Risk  (11/12/2022)      Readmission Risk Interventions    11/13/2022    4:19 PM  Readmission Risk Prevention Plan  Transportation Screening Complete  Medication Review (RN Care Manager) Complete  PCP or Specialist appointment within 3-5 days of discharge Complete  HRI or Home Care Consult Complete  SW Recovery Care/Counseling Consult Complete  Palliative Care Screening Complete  Skilled Nursing Facility Not Applicable

## 2022-11-13 NOTE — Progress Notes (Signed)
   NAME:  Elizabeth Fields, MRN:  [id number], DOB:  1931-07-01, LOS: 1 ADMISSION DATE:  11/12/2022, CONSULTATION DATE:  11/12/2022 REFERRING MD:  Dr. Otelia Limes - Stroke, CHIEF COMPLAINT: Vent management  History of Present Illness:  Elizabeth Fields is a 89 year old female with a past medical history significant for COPD, atrial fibrillation anticoagulated with Coumadin, aortic valve insufficiency, CKD stage IIIb, HFpEF, and chronic venous stasis dermatitis who presented to [facility] ED 11/3 with acute onset of AMS and somnolence, last known normal 5 PM on Sunday.   She had acute onset mental status changes and dysarthria while speaking to her nephew/HCPOA.  Patient was transferred to [facility] and admitted per neurology as code stroke.  MRI brain revealed acute left MCA infarct with a concern for an acute LVO.  Patient was seen by neurointerventional radiology -she underwent right CFA approach angiogram followed by revascularization of occluded distal left ICA, left MCA and left ACA segments.  Postprocedure CT brain did not show any evidence of ICH, patient will remain intubated with PCCM consult for vent management. She had recent hospitalization with discharge from [facility] 10/38 for right leg bleeding in the setting of INR of 6.6, warfarin was held and reversed with vitamin K  Pertinent  Medical History  COPD, atrial fibrillation anticoagulated with Coumadin, aortic valve insufficiency, CKD stage IIIb, HFpEF, and chronic venous stasis dermatitis   Significant Hospital Events: Including procedures, antibiotic start and stop dates in addition to other pertinent events   11/4 admitted as code stroke female s/p thrombectomy  Interim History / Subjective:  Extubated but poorly responsive. Resp status tenuous. DNR  Objective   Blood pressure (!) 111/48, pulse (!) 106, temperature 98.5 F (36.9 C), temperature source Axillary, resp. rate (!) 28, SpO2 97%.    Vent Mode: PSV;CPAP FiO2 (%):  [40 %-100 %] 40 % Set  Rate:  [18 bmp-22 bmp] 22 bmp Vt Set:  [340 mL-380 mL] 340 mL PEEP:  [5 cmH20] 5 cmH20 Pressure Support:  [10 cmH20] 10 cmH20 Plateau Pressure:  [13 cmH20-16 cmH20] 13 cmH20   Intake/Output Summary (Last 24 hours) at 11/13/2022 0748 Last data filed at 11/13/2022 0700 Gross per 24 hour  Intake 694.23 ml  Output 1200 ml  Net -505.77 ml   There were no vitals filed for this visit.  Examination: Frail elderly woman in NAD Raises eyebrows to name, not following commands Rhonci and transmitted upper airway sounds, mildly tachypneic +tachycardia Muscle wasting Abd soft Cannot get her to move either side for me  No new labs  Resolved Hospital Problem list     Assessment & Plan:  L MCA Stroke s/p attempted thrombectomy Frail elderly COPD Afib CKD3b Severe protein calorie malnutrition POA DNR  She is extubated but tenuous Family does not want feeding tube I think she is unfortunately at end of life Discussed with neurology who will speak with family PCCM will be available PRN  Myrla Halsted MD PCCM

## 2022-11-13 NOTE — Progress Notes (Addendum)
STROKE TEAM PROGRESS NOTE   BRIEF HPI Ms.  Elizabeth Fields is an 89 y.o. female with a PMHx of CKD, atrial fibrillation on warfarin, aortic valve insuffiency, CAD, CKD stage IIIb, HFpEF and chronic venous stasis dermatitis with ulcers who presented to the [facility] ED on Sunday evening with acute onset of altered mental status with somnolence which had occurred abruptly while talking to her relative over the phone. LKN was 5 PM on Sunday. Her relative called her neighbor who brought her to the emergency room.  The patient was nonverbal on arrival and was having left gaze preference. CTA showed occluded left ICA and MCA. CTP showed core infarct volume between 33-29ml. NIH after CTP was 35. CODE IR activated and patient was transferred to [facility].  Patient was recently discharged from [facility] for acute blood loss anemia secondary to supratherapeutic INR  6.6 with right leg bleeding. She was discharged off warfarin d/t anemia and bleeding.   SIGNIFICANT HOSPITAL EVENTS 11/4 AM: IR procedure: Status post complete revascularization of the occluded left internal carotid terminus, the left MCA and the left ACA achieving TICI 3 revascularization.  Started on Heparin gtt Extubated @ 1500  INTERIM HISTORY/SUBJECTIVE Palliative consult placed. Patient made COMFORT CARE.  Nephew, Peck, discussed moving patient to inpatient hospice, SW aware.    OBJECTIVE  CBC    Component Value Date/Time   WBC 9.6 11/12/2022 0201   RBC 3.38 (L) 11/12/2022 0201   HGB 10.2 (L) 11/12/2022 1058   HGB 12.5 02/03/2020 1557   HCT 30.0 (L) 11/12/2022 1058   HCT 36.6 02/03/2020 1557   PLT 403 (H) 11/12/2022 0201   PLT 232 02/03/2020 1557   MCV 98.5 11/12/2022 0201   MCV 96 02/03/2020 1557   MCH 30.8 11/12/2022 0201   MCHC 31.2 11/12/2022 0201   RDW 17.1 (H) 11/12/2022 0201   RDW 13.1 02/03/2020 1557   LYMPHSABS 0.9 11/11/2022 1842   MONOABS 0.9 11/11/2022 1842   EOSABS 0.1 11/11/2022 1842   BASOSABS 0.1 11/11/2022 1842     BMET    Component Value Date/Time   NA 137 11/12/2022 1058   NA 140 02/03/2020 1557   K 3.6 11/12/2022 1058   CL 105 11/12/2022 0201   CO2 20 (L) 11/12/2022 0201   GLUCOSE 92 11/12/2022 0201   BUN 26 (H) 11/12/2022 0201   BUN 23 02/03/2020 1557   CREATININE 0.94 11/12/2022 0201   CREATININE 1.13 09/13/2011 0951   CALCIUM 8.1 (L) 11/12/2022 0201   GFRNONAA 58 (L) 11/12/2022 0201   GFRNONAA 47 (L) 09/13/2011 0951    IMAGING past 24 hours MR BRAIN WO CONTRAST  Result Date: 11/12/2022 CLINICAL DATA:  Altered mental status EXAM: MRI HEAD WITHOUT CONTRAST TECHNIQUE: Multiplanar, multiecho pulse sequences of the brain and surrounding structures were obtained without intravenous contrast. COMPARISON:  Brain MRI 11/12/2022 12:33 a.m. FINDINGS: Brain: Unchanged appearance abnormal DWI signal within the left deep MCA territory. No new site of acute or subacute ischemia. No chronic microhemorrhage or siderosis. There is multifocal hyperintense T2-weighted signal within the white matter. Generalized volume loss. The midline structures are normal. Vascular: Normal flow voids Skull and upper cervical spine: Normal marrow signal Sinuses/Orbits: Paranasal sinuses are clear. No mastoid effusion. Normal orbits. Ocular lens replacements. Other: None IMPRESSION: Unchanged appearance of acute/subacute left deep MCA territory infarct. No new site of acute or subacute ischemia. Electronically Signed   By: Deatra Robinson M.D.   On: 11/12/2022 19:09   ECHOCARDIOGRAM COMPLETE  Result  Date: 11/12/2022    ECHOCARDIOGRAM REPORT   Patient Name:   Elizabeth Fields Date of Exam: 11/12/2022 Medical Rec #:  [id number]    Height:       59.0 in Accession #:    [id number]   Weight:       83.8 lb Date of Birth:  January 23, 1931    BSA:          1.274 m Patient Age:    89 years     BP:           131/66 mmHg Patient Gender: F            HR:           85 bpm. Exam Location:  Inpatient Procedure: 2D Echo, Cardiac Doppler and Color Doppler  Indications:    Stroke  History:        Patient has prior history of Echocardiogram examinations, most                 recent 08/10/2019. CHF, CAD, Stroke, Aortic Valve Disease,                 Arrythmias:Atrial Fibrillation, Signs/Symptoms:Altered Mental                 Status; Risk Factors:Hypertension and Dyslipidemia. Thyroid                 disease, CKD.  Sonographer:    Milda Smart Referring Phys: [phone] ERIC LINDZEN  Sonographer Comments: Image acquisition challenging due to respiratory motion. Pt on vent. IMPRESSIONS  1. Left ventricular ejection fraction, by estimation, is 60 to 65%. The left ventricle has normal function. The left ventricle has no regional wall motion abnormalities. Left ventricular diastolic function could not be evaluated.  2. Right ventricular systolic function is normal. The right ventricular size is normal. There is moderately elevated pulmonary artery systolic pressure. The estimated right ventricular systolic pressure is 49.3 mmHg.  3. Left atrial size was severely dilated.  4. Right atrial size was severely dilated.  5. The mitral valve is abnormal. Mild to moderate mitral valve regurgitation. No evidence of mitral stenosis.  6. Tricuspid valve regurgitation is severe.  7. The aortic valve is tricuspid. There is mild calcification of the aortic valve. Aortic valve regurgitation is mild. Aortic valve sclerosis/calcification is present, without any evidence of aortic stenosis.  8. The inferior vena cava is dilated in size with <50% respiratory variability, suggesting right atrial pressure of 15 mmHg. Conclusion(s)/Recommendation(s): Findings suggestive of restrictive cardiomyopathy. FINDINGS  Left Ventricle: Left ventricular ejection fraction, by estimation, is 60 to 65%. The left ventricle has normal function. The left ventricle has no regional wall motion abnormalities. The left ventricular internal cavity size was normal in size. There is  no left ventricular hypertrophy. Left  ventricular diastolic function could not be evaluated due to atrial fibrillation. Left ventricular diastolic function could not be evaluated. Right Ventricle: The right ventricular size is normal. No increase in right ventricular wall thickness. Right ventricular systolic function is normal. There is moderately elevated pulmonary artery systolic pressure. The tricuspid regurgitant velocity is 2.93 m/s, and with an assumed right atrial pressure of 15 mmHg, the estimated right ventricular systolic pressure is 49.3 mmHg. Left Atrium: Left atrial size was severely dilated. Right Atrium: Right atrial size was severely dilated. Pericardium: There is no evidence of pericardial effusion. Mitral Valve: The mitral valve is abnormal. There is mild thickening of the mitral valve leaflet(s). Mild to moderate  mitral valve regurgitation. No evidence of mitral valve stenosis. Tricuspid Valve: The tricuspid valve is normal in structure. Tricuspid valve regurgitation is severe. No evidence of tricuspid stenosis. Aortic Valve: The aortic valve is tricuspid. There is mild calcification of the aortic valve. Aortic valve regurgitation is mild. Aortic valve sclerosis/calcification is present, without any evidence of aortic stenosis. Pulmonic Valve: The pulmonic valve was normal in structure. Pulmonic valve regurgitation is trivial. No evidence of pulmonic stenosis. Aorta: The aortic root is normal in size and structure. Venous: The inferior vena cava is dilated in size with less than 50% respiratory variability, suggesting right atrial pressure of 15 mmHg. IAS/Shunts: No atrial level shunt detected by color flow Doppler.  LEFT VENTRICLE PLAX 2D LVIDd:         4.10 cm     Diastology LVIDs:         3.30 cm     LV e' medial:  4.03 cm/s LV PW:         0.80 cm     LV e' lateral: 4.13 cm/s LV IVS:        0.60 cm LVOT diam:     1.80 cm LV SV:         49 LV SV Index:   38 LVOT Area:     2.54 cm  LV Volumes (MOD) LV vol d, MOD A4C: 34.2 ml LV vol  s, MOD A4C: 13.1 ml LV SV MOD A4C:     34.2 ml RIGHT VENTRICLE RV Basal diam:  3.00 cm RV S prime:     9.25 cm/s TAPSE (M-mode): 0.9 cm LEFT ATRIUM             Index        RIGHT ATRIUM           Index LA diam:        4.20 cm 3.30 cm/m   RA Area:     19.40 cm LA Vol (A2C):   43.4 ml 34.06 ml/m  RA Volume:   54.60 ml  42.85 ml/m LA Vol (A4C):   48.3 ml 37.91 ml/m LA Biplane Vol: 49.0 ml 38.46 ml/m  AORTIC VALVE LVOT Vmax:         93.20 cm/s LVOT Vmean:        66.100 cm/s LVOT VTI:          0.192 m AR Vena Contracta: 0.10 cm  AORTA Ao Root diam: 2.40 cm Ao Asc diam:  2.10 cm MR Peak grad: 79.6 mmHg   TRICUSPID VALVE MR Mean grad: 57.0 mmHg   TR Peak grad:   34.3 mmHg MR Vmax:      446.00 cm/s TR Mean grad:   26.0 mmHg MR Vmean:     363.0 cm/s  TR Vmax:        293.00 cm/s                           TR Vmean:       249.0 cm/s                            SHUNTS                           Systemic VTI:  0.19 m  Systemic Diam: 1.80 cm Arvilla Meres MD Electronically signed by Arvilla Meres MD Signature Date/Time: 11/12/2022/3:23:58 PM    Final     Vitals:   11/13/22 0400 11/13/22 0500 11/13/22 0600 11/13/22 0700  BP: (!) 107/53 (!) 100/41 (!) 111/43 (!) 111/48  Pulse: (!) 108 93 88 (!) 106  Resp: (!) 22 (!) 24 (!) 26 (!) 28  Temp: 98.5 F (36.9 C)     TempSrc: Axillary     SpO2: 96% 97% 97% 97%     PHYSICAL EXAM General: Frail elderly cachectic malnourished looking Caucasian lady in no acute distress Psych:  Mood and affect appropriate for situation CV: Regular rate and rhythm on monitor Respiratory:  Regular, unlabored respirations on room air GI: Abdomen soft and non-tender  NEURO:  Mental Status: Globally aphasic, no verbal output, does not follow commands. Opens eyes very slightly to noxious stimuli. Right hemiplegia with hypotonia. Spontaneous, non-purposeful movement of LUE seen.  Withdraws left lower extremity.     ASSESSMENT/PLAN  Acute Ischemic Infarct:   left MCA territory s/p mechanical thrombectomy Etiology:  likely embolic secondary to Atrial Fibrillation and stopping coumadin d/t bleeding and anemia  Code Stroke CT head: No acute abnormality. Small vessel disease. Atrophy.   CTA head & neck  Positive Large Vessel Occlusion: Occluded Left ICA in the neck (about 2.5 cm from the origin), with occluded Left ICA siphon, terminus, and occluded Left MCA. CT perfusion  Positive for mismatch of Left MCA oligemia (estimated 155 mL) and core infarct volume (estimated between 33 and 77 mL when considering both CBF and Tmax >10s). Hypoperfusion index of 0.5. Post IR CT: no evidence of intracranial hemorrhage.  MRI:  Acute left MCA distribution infarct involving the left basal ganglia, corona radiata, and insula. No associated hemorrhage or mass effect.  Loss of normal flow void within the left ICA to the terminus, which could reflect slow flow and/or occlusion 11/4 repeat MRI: stable left deep MCA territory infarct.   2D Echo: LVEF 60-65%, Elevated PASP, Severely Dilated left & right atria, Mild to moderate MVR, Severe TVR, Mild AVR.  LDL 51 HgbA1c 5.4 VTE prophylaxis - SCDs No antithrombotic (was recently taken off warfarin prior to admission due to anemia and bleeding), now on no antithrombotic due to comfort measures only.  Therapy recommendations:  Pending Disposition:  pending  Acute Pulmonary Edema Hx of COPD Intubated for procedure Extubated 11/4 @ 1500 Patient is now Comfort Care Measures only  Atrial fibrillation Home Meds: warfarin (recently taken off d/t bleeding and anemia) Continue telemetry monitoring Comfort care measures only, Heparin gtt stopped.   Hx of Hypertension CHF CAD Home meds:  Toprol-XL, lasix 20mg , spironolactone 25mg  Stable  Hyperlipidemia Home meds:  simvastatin 20mg , resume once able to take PO LDL 51, goal < 70 Continue statin at discharge  Dysphagia Patient has post-stroke dysphagia, SLP consulted     Diet   Diet NPO time specified   Advance diet as tolerated  Other Stroke Risk Factors Advanced age  Other Active Problems CKD IIIa GFR at baseline BUN 26, Creatinine 0.94 Hypothyroid Synthroid home med Hx of [facility]  Hospital day # 1  Pt seen by Neuro NP/APP and later by MD. Note/plan to be edited by MD as needed.    Lynnae January, DNP, AGACNP-BC [facility] Please use AMION for contact information & EPIC for messaging.  I have personally obtained history,examined this patient, reviewed notes, independently viewed imaging studies, participated in medical decision making and plan of  care.ROS completed by me personally and pertinent positives fully documented  I have made any additions or clarifications directly to the above note. Agree with note above.  Patient remains aphasic with dense right hemiplegia and not following commands.  Unfortunately she is unlikely to recover anytime soon and will likely need feeding tube and prolonged rehabilitation and nursing home.  Patient's family is clear that she would not have wanted this and would likely transition her to comfort care soon.  No family at the bedside during a.m. rounds.  Discussed with Dr. Katrinka Blazing critical care medicine.  Continue IV heparin for now and await family arrival to make decision on comfort care.This patient is critically ill and at significant risk of neurological worsening, death and care requires constant monitoring of vital signs, hemodynamics,respiratory and cardiac monitoring, extensive review of multiple databases, frequent neurological assessment, discussion with family, other specialists and medical decision making of high complexity.I have made any additions or clarifications directly to the above note.This critical care time does not reflect procedure time, or teaching time or supervisory time of PA/NP/Med Resident etc but could involve care discussion time.  I spent 30 minutes of neurocritical care time   in the care of  this patient.      Delia Heady, MD Medical Director [facility] Pager: [phone] 11/13/2022 1:56 PM  To contact Stroke Continuity provider, please refer to [email or web address]. After hours, contact General Neurology

## 2022-11-13 NOTE — Consult Note (Signed)
Palliative Medicine Inpatient Consult Note  Consulting Provider: Lynnae January, NP   Reason for consult:   Palliative Care Consult Services Palliative Medicine Consult  Reason for Consult? goals of care, comfort care discussion    11/13/2022  HPI:  Per intake H&P --> Ms.  Elizabeth Fields is an 89 y.o. female with a PMHx of CKD, atrial fibrillation on warfarin, aortic valve insuffiency, CAD, HFpEF and chronic venous stasis dermatitis with ulcers who presented to the [facility] ED on Sunday evening with acute onset of altered mental status with somnolence identified to have an acute left MCA distribution infarct.  The Palliative care team has been asked to support additional goals of care conversations in the setting of a severe stroke.   Clinical Assessment/Goals of Care:  *Please note that this is a verbal dictation therefore any spelling or grammatical errors are due to the "Dragon Medical One" system interpretation.  I have reviewed medical records including EPIC notes, labs and imaging, received report from bedside RN, assessed the patient who is lying in bed able to smile otherwise not verbal.    I called patients nephew, Marlinda Mike to further discuss diagnosis prognosis, GOC, EOL wishes, disposition and options.   I introduced Palliative Medicine as specialized medical care for people living with serious illness. It focuses on providing relief from the symptoms and stress of a serious illness. The goal is to improve quality of life for both the patient and the family.  Medical History Review and Understanding:  Reviewed patient's history of chronic kidney disease, atrial fibrillation, coronary artery disease, chronic venous stasis dermatitis, diastolic heart failure, and aortic valve insufficiency.  Social History:  Elizabeth Fields is from [facility].  She was married though her husband sadly passed away in 10/14/22 after greater than 50 years of marriage.  Elizabeth Fields never had any children.   Mainly worked throughout her career at [employer].  She and her husband would enjoy annual beach trips.  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  Functional and Nutritional State:  Preceding hospitalization Elizabeth Fields was living independently able to attend to all B ADLs and IADLs on her own.  Advance Directives:  A detailed discussion was had today regarding advanced directives.  [name] surrogate decision maker is her nephew, [name].  Code Status:  Concepts specific to code status, artifical feeding and hydration, continued IV antibiotics and rehospitalization was had.  The difference between a aggressive medical intervention path  and a palliative comfort care path for this patient at this time was had.   Tahisha is an established DO NOT RESUSCITATE DO NOT INTUBATE CODE STATUS.  [name] would never desire artificial nutrition via NGT/OGT/G-Tube  Discussion:  [place] and I discussed the type of woman Elizabeth Fields was she was identified as greatly independent. WE discussed the extensive nature of the stroke that Elizabeth Fields has endured and the likelihood that she will require long term assistance moving forward given her hemiplegia.   Marlinda Mike shares openly and honestly that she would never desire a life dependent on others.  She would not want artificial supportive measures to sustain or prolong life.  We talked about transition to comfort measures in house and what that would entail inclusive of medications to control pain, dyspnea, agitation, nausea, itching, and hiccups.   We discussed stopping all uneccessary measures such as cardiac monitoring, blood draws, needle sticks, and frequent vital signs.   Marlinda Mike is in agreement with focusing on dignity and comfort.  We  discussed transitioning Elizabeth Fields to an inpatient hospice home. He shares he would prefer one in the [place] area. I shared with him that I would let the MSW know to assist with placement.    Discussed the importance of continued conversation with family and their  medical providers regarding overall plan of care and treatment options, ensuring decisions are within the context of the patients values and GOCs.  Decision Maker: [name] Aurther Loft): [phone] (Mobile)   SUMMARY OF RECOMMENDATIONS   DNAR/DNI  Comfort focused care  Comfort medications per [facility]  Patients nephew is hopeful for transfer to [facility] - MSW Osborne Casco coordinating  Ongoing PMT support  Code Status/Advance Care Planning: DNAR/DNI  Palliative Prophylaxis:  Aspiration, Bowel Regimen, Delirium Protocol, Frequent Pain Assessment, Oral Care, Palliative Wound Care, and Turn Reposition  Additional Recommendations (Limitations, Scope, Preferences): Continue current care  Psycho-social/Spiritual:  Desire for further Chaplaincy support: Yes Additional Recommendations: Education on EOL care   Prognosis: Poor overall - hospice is reasonable based on patients prior stated wishes  Discharge Planning: Discharge to inpatient hospice once a bed is identified.  Vitals:   11/13/22 0700 11/13/22 0800  BP: (!) 111/48   Pulse: (!) 106   Resp: (!) 28   Temp:  (!) 97.4 F (36.3 C)  SpO2: 97%     Intake/Output Summary (Last 24 hours) at 11/13/2022 8657 Last data filed at 11/13/2022 0700 Gross per 24 hour  Intake 687.88 ml  Output 1200 ml  Net -512.12 ml   Gen:  Frail elderly F in NAD HEENT: Dry mucous membranes CV: Irregular rate and rhythm  PULM: On 2LPM [place], breathing is not labored ABD: soft/nontender EXT: Faint pedal pulses R hemiplegia Neuro: Opens eyes and smiles  PPS: 10%   This conversation/these recommendations were discussed with patient primary care team, Dr. Pearlean Brownie  Billing based on MDM: High  Problems Addressed: One acute or chronic illness or injury that poses a threat to life or bodily function  Amount and/or Complexity of Data: Category 3:Discussion of management or  test interpretation with external physician/other qualified health care professional/appropriate source (not separately reported)  Risks: Decision regarding hospitalization or escalation of hospital care and Decision not to resuscitate or to de-escalate care because of poor prognosis ______________________________________________________ Lamarr Lulas [place] Palliative Medicine Team Team Cell Phone: [phone] Please utilize secure chat with additional questions, if there is no response within 30 minutes please call the above phone number  Palliative Medicine Team providers are available by phone from 7am to 7pm daily and can be reached through the team cell phone.  Should this patient require assistance outside of these hours, please call the patient's attending physician.

## 2022-11-13 NOTE — TOC CM/SW Note (Signed)
Transition of Care [facility]) - Inpatient Brief Assessment   Patient Details  Name: Elizabeth Fields MRN: [id number] Date of Birth: [birth year]  [facility] CM/SW Contact:    Mearl Latin, LCSW Phone Number: 11/13/2022, 9:28 AM   Clinical Narrative: Patient admitted from home alone and was extubated yesterday undergoing stroke workup. Please place consult as TOC needs arise.    Transition of Care Asessment: Insurance and Status: Insurance coverage has been reviewed Patient has primary care physician: Yes Home environment has been reviewed: From home Prior level of function:: Independent Prior/Current Home Services: No current home services Social Determinants of Health Reivew: SDOH reviewed no interventions necessary Readmission risk has been reviewed: Yes Transition of care needs: transition of care needs identified, TOC will continue to follow

## 2022-11-13 NOTE — Progress Notes (Signed)
Report called to [facility]. No questions or concerns at this time.

## 2022-11-13 NOTE — Progress Notes (Signed)
Occupational Therapy Discharge Patient Details Name: Elizabeth Fields MRN: [id number] DOB: 09-19-1931 Today's Date: 11/13/2022 Time:  -     Patient discharged from OT services secondary to comfort measure/ hospice level care.   Please see latest therapy progress note for current level of functioning and progress toward goals.    Progress and discharge plan discussed with patient and/or caregiver: Patient unable to participate in discharge planning and no caregivers available  GO     [name] 11/13/2022, 10:20 AM

## 2022-11-13 NOTE — TOC Initial Note (Signed)
Transition of Care [facility]) - Initial/Assessment Note    Patient Details  Name: Elizabeth Fields MRN: [id number] Date of Birth: 04/06/1931  Transition of Care [facility]) CM/SW Contact:    Mearl Latin, LCSW Phone Number: 11/13/2022, 9:45 AM  Clinical Narrative:                 CSW received consult for facility hospice placement. CSW spoke with patient's nephew and offered choice. He requested [facility] as all patient's friends live in [place]. CSW sent referral for review to [facility].   Expected Discharge Plan: Hospice Medical Facility Barriers to Discharge: Hospice Bed not available   Patient Goals and CMS Choice Patient states their goals for this hospitalization and ongoing recovery are:: Comfort CMS Medicare.gov Compare Post Acute Care list provided to:: Patient Represenative (must comment) Choice offered to / list presented to :  Aurther Loft)      Expected Discharge Plan and Services In-house Referral: Clinical Social Work, Hospice / Palliative Care   Post Acute Care Choice: Hospice Living arrangements for the past 2 months: Single Family Home                                      Prior Living Arrangements/Services Living arrangements for the past 2 months: Single Family Home Lives with:: Self Patient language and need for interpreter reviewed:: Yes Do you feel safe going back to the place where you live?: Yes      Need for Family Participation in Patient Care: Yes (Comment) Care giver support system in place?: Yes (comment) Current home services: DME Criminal Activity/Legal Involvement Pertinent to Current Situation/Hospitalization: No - Comment as needed  Activities of Daily Living      Permission Sought/Granted Permission sought to share information with : Facility Medical sales representative, Family Supports Permission granted to share information with : No  Share Information with NAME: Marlinda Mike  Permission granted to share info w AGENCY:  Hospice  Permission granted to share info w Relationship: Nephew  Permission granted to share info w Contact Information: [phone]  Emotional Assessment Appearance:: Appears stated age Attitude/Demeanor/Rapport: Unable to Assess Affect (typically observed): Unable to Assess   Alcohol / Substance Use: Not Applicable Psych Involvement: No (comment)  Admission diagnosis:  Stroke [facility]) [I63.9] Middle cerebral artery embolism, left [I66.02] Patient Active Problem List   Diagnosis Date Noted   Acute CVA (cerebrovascular accident) (HCC) 11/12/2022   Essential hypertension 11/12/2022   Dyslipidemia 11/12/2022   Chronic atrial fibrillation with RVR (HCC) 11/12/2022   Stroke (HCC) 11/12/2022   Middle cerebral artery embolism, left 11/12/2022   Acute encephalopathy 11/11/2022   Metabolic acidosis 11/07/2022   Acute kidney injury superimposed on CKD (HCC) 11/06/2022   Hypotension 11/06/2022   Hypokalemia 11/06/2022   Acute blood loss anemia 11/03/2022   Supratherapeutic INR 11/02/2022   (HFpEF) heart failure with preserved ejection fraction (HCC) 11/02/2022   Venous stasis dermatitis of both lower extremities 10/30/2022   Venous stasis ulcer with fat layer exposed (HCC) 10/30/2022   Spondylosis without myelopathy or radiculopathy, thoracic region 03/29/2020   Thoracic facet syndrome (Multilevel) (Bilateral) 03/22/2020   Spondylosis without myelopathy or radiculopathy, lumbosacral region 03/08/2020   Osteoarthritis of facet joint of lumbar spine 03/08/2020   DDD (degenerative disc disease), cervical 03/02/2020   Atherosclerotic peripheral vascular disease (HCC) 03/02/2020   Uncomplicated opioid dependence (HCC) 02/03/2020   Chronic anticoagulation (Coumadin) 02/03/2020  Hyponatremia 02/03/2020   Hypocalcemia 02/03/2020   Hypermagnesemia 02/03/2020   Decreased GFR 02/03/2020   Elevated serum creatinine 02/03/2020   Elevated brain natriuretic peptide (BNP) level 02/03/2020   Low  hemoglobin 02/03/2020   Low hematocrit 02/03/2020   Compression fracture of L4 vertebra, sequela 02/03/2020   Compression fracture of L1 vertebra, sequela  02/03/2020   Compression fracture of T12 vertebra, sequela 02/03/2020   Abnormal MRI, lumbar spine (11/26/2018) 02/03/2020   DDD (degenerative disc disease), lumbosacral 02/03/2020   DDD (degenerative disc disease), thoracolumbar 02/03/2020   Osteoarthritis of sacroiliac joints (HCC) (Bilateral) 02/03/2020   Chronic sacroiliac joint pain (Bilateral) 02/03/2020   History of osteopenia 02/03/2020   Chronic midline thoracic back pain 02/03/2020   Pharmacologic therapy 01/26/2020   Disorder of skeletal system 01/26/2020   Problems influencing health status 01/26/2020   Multiple chronic diseases 01/26/2020   Fall 03/30/2019   Hypothyroidism 02/10/2019   Protein-calorie malnutrition, severe 02/10/2019   Hip fracture, sequela (Left) 02/09/2019   Benign hypertensive kidney disease with chronic kidney disease 09/24/2018   Edema 09/24/2018   Hematuria 09/24/2018   Secondary hyperparathyroidism of renal origin (HCC) 09/24/2018   Lumbar spondylosis 09/06/2015   Chronic pain syndrome 09/06/2015   Chronic sacroiliac joint pain (Left) 07/28/2015   Arteriosclerosis of coronary artery 07/28/2015   Bergmann's syndrome (AKA: Fat Embolism Syndrome) 07/28/2015   HLD (hyperlipidemia) 07/28/2015   Billowing mitral valve 07/28/2015   Osteoporosis, post-menopausal 07/28/2015   Lumbar facet syndrome (Bilateral) 07/28/2015   Chronic low back pain (1ry area of Pain) (Bilateral) w/o sciatica 07/28/2015   Breathlessness on exertion 02/14/2015   Vitamin B12 deficiency anemia 04/26/2014   Acquired atrophy of thyroid 01/20/2014   Beat, premature ventricular 08/26/2013   History of cardiac catheterization 08/26/2013   MI (mitral incompetence) 08/26/2013   TI (tricuspid incompetence) 08/26/2013   Other specified postprocedural states 08/26/2013   Aortic  insufficiency 08/26/2013   Rheumatic tricuspid insufficiency 08/26/2013   Atrial fibrillation (HCC) 05/05/2013   PCP:  Gracelyn Nurse, MD Pharmacy:   [facility] - [place], [place] - [address] [place] [address] Phone: [phone] Fax: [phone]     Social Determinants of Health (SDOH) Social History: SDOH Screenings   Food Insecurity: No Food Insecurity (11/02/2022)  Housing: Low Risk  (11/02/2022)  Transportation Needs: No Transportation Needs (11/02/2022)  Utilities: Not At Risk (11/02/2022)  Depression (PHQ2-9): Low Risk  (04/19/2020)  Financial Resource Strain: Low Risk  (08/21/2022)   Received from [facility]  Tobacco Use: Low Risk  (11/12/2022)   SDOH Interventions:     Readmission Risk Interventions     No data to display

## 2022-11-13 NOTE — Anesthesia Postprocedure Evaluation (Signed)
Anesthesia Post Note  Patient: Elizabeth Fields  Procedure(s) Performed: RADIOLOGY WITH ANESTHESIA     Patient location during evaluation: SICU Anesthesia Type: General Level of consciousness: sedated Pain management: pain level controlled Vital Signs Assessment: post-procedure vital signs reviewed and stable Respiratory status: patient remains intubated per anesthesia plan Cardiovascular status: stable Postop Assessment: no apparent nausea or vomiting Anesthetic complications: no   No notable events documented.  Last Vitals:  Vitals:   11/13/22 1000 11/13/22 1100  BP: (!) 133/59 114/63  Pulse: (!) 118 (!) 122  Resp: (!) 24 (!) 27  Temp:    SpO2: 99% 97%    Last Pain:  Vitals:   11/13/22 0800  TempSrc: Axillary                 Kennieth Rad

## 2022-11-16 LAB — CULTURE, BLOOD (ROUTINE X 2)
Culture: NO GROWTH
Culture: NO GROWTH
Special Requests: ADEQUATE

## 2022-11-21 ENCOUNTER — Ambulatory Visit: Payer: PPO | Admitting: Internal Medicine

## 2022-12-09 DEATH — deceased

## 2023-01-16 ENCOUNTER — Ambulatory Visit: Payer: PPO | Admitting: Dermatology

## 2023-03-26 ENCOUNTER — Ambulatory Visit: Payer: PPO | Admitting: Dermatology
